# Patient Record
Sex: Female | Born: 1945 | Race: White | Hispanic: No | Marital: Married | Smoking: Former smoker
Health system: Southern US, Community
[De-identification: ages and names within clinical notes are randomized; demographics above are authoritative.]

## PROBLEM LIST (undated history)

## (undated) DIAGNOSIS — I471 Supraventricular tachycardia: Secondary | ICD-10-CM

## (undated) DIAGNOSIS — I10 Essential (primary) hypertension: Secondary | ICD-10-CM

## (undated) DIAGNOSIS — J449 Chronic obstructive pulmonary disease, unspecified: Secondary | ICD-10-CM

## (undated) DIAGNOSIS — E785 Hyperlipidemia, unspecified: Secondary | ICD-10-CM

## (undated) DIAGNOSIS — J45909 Unspecified asthma, uncomplicated: Secondary | ICD-10-CM

## (undated) DIAGNOSIS — Z923 Personal history of irradiation: Secondary | ICD-10-CM

## (undated) HISTORY — DX: Supraventricular tachycardia: I47.1

## (undated) HISTORY — PX: CHOLECYSTECTOMY: SHX55

## (undated) HISTORY — DX: Essential (primary) hypertension: I10

## (undated) HISTORY — PX: JOINT REPLACEMENT: SHX530

## (undated) HISTORY — PX: HEMORROIDECTOMY: SUR656

## (undated) HISTORY — PX: CARPAL TUNNEL RELEASE: SHX101

## (undated) HISTORY — DX: Hyperlipidemia, unspecified: E78.5

## (undated) HISTORY — PX: ABDOMINAL HYSTERECTOMY: SHX81

## (undated) HISTORY — PX: CATARACT EXTRACTION: SUR2

## (undated) HISTORY — PX: TOE SURGERY: SHX1073

## (undated) HISTORY — DX: Unspecified asthma, uncomplicated: J45.909

## (undated) HISTORY — PX: CARDIAC CATHETERIZATION: SHX172

---

## 2000-11-05 ENCOUNTER — Inpatient Hospital Stay (HOSPITAL_COMMUNITY): Admission: AD | Admit: 2000-11-05 | Discharge: 2000-11-06 | Payer: Self-pay | Admitting: Cardiology

## 2003-02-14 ENCOUNTER — Ambulatory Visit (HOSPITAL_BASED_OUTPATIENT_CLINIC_OR_DEPARTMENT_OTHER): Admission: RE | Admit: 2003-02-14 | Discharge: 2003-02-15 | Payer: Self-pay | Admitting: Specialist

## 2010-10-01 ENCOUNTER — Encounter: Payer: Self-pay | Admitting: Obstetrics and Gynecology

## 2011-03-14 ENCOUNTER — Ambulatory Visit: Payer: Self-pay | Admitting: Cardiovascular Disease

## 2012-04-27 ENCOUNTER — Ambulatory Visit (HOSPITAL_BASED_OUTPATIENT_CLINIC_OR_DEPARTMENT_OTHER): Admission: RE | Admit: 2012-04-27 | Payer: Managed Care, Other (non HMO) | Source: Ambulatory Visit

## 2012-04-27 ENCOUNTER — Encounter (HOSPITAL_BASED_OUTPATIENT_CLINIC_OR_DEPARTMENT_OTHER): Admission: RE | Payer: Self-pay | Source: Ambulatory Visit

## 2012-04-27 SURGERY — OSTEOTOMY, METATARSAL BONE
Anesthesia: Monitor Anesthesia Care | Laterality: Right

## 2012-06-08 ENCOUNTER — Ambulatory Visit (HOSPITAL_BASED_OUTPATIENT_CLINIC_OR_DEPARTMENT_OTHER): Admission: RE | Admit: 2012-06-08 | Payer: Managed Care, Other (non HMO) | Source: Ambulatory Visit

## 2012-06-08 ENCOUNTER — Encounter (HOSPITAL_BASED_OUTPATIENT_CLINIC_OR_DEPARTMENT_OTHER): Admission: RE | Payer: Self-pay | Source: Ambulatory Visit

## 2012-06-08 SURGERY — OSTEOTOMY, METATARSAL BONE
Anesthesia: Regional | Laterality: Right

## 2014-09-13 DIAGNOSIS — E785 Hyperlipidemia, unspecified: Secondary | ICD-10-CM | POA: Diagnosis not present

## 2014-09-13 DIAGNOSIS — J45909 Unspecified asthma, uncomplicated: Secondary | ICD-10-CM | POA: Diagnosis not present

## 2014-09-13 DIAGNOSIS — E780 Pure hypercholesterolemia: Secondary | ICD-10-CM | POA: Diagnosis not present

## 2014-09-13 DIAGNOSIS — E119 Type 2 diabetes mellitus without complications: Secondary | ICD-10-CM | POA: Diagnosis not present

## 2014-09-13 DIAGNOSIS — I471 Supraventricular tachycardia: Secondary | ICD-10-CM | POA: Diagnosis not present

## 2014-10-05 DIAGNOSIS — Z6823 Body mass index (BMI) 23.0-23.9, adult: Secondary | ICD-10-CM | POA: Diagnosis not present

## 2014-10-05 DIAGNOSIS — E119 Type 2 diabetes mellitus without complications: Secondary | ICD-10-CM | POA: Diagnosis not present

## 2014-10-05 DIAGNOSIS — I471 Supraventricular tachycardia: Secondary | ICD-10-CM | POA: Diagnosis not present

## 2014-10-05 DIAGNOSIS — M1990 Unspecified osteoarthritis, unspecified site: Secondary | ICD-10-CM | POA: Diagnosis not present

## 2014-10-05 DIAGNOSIS — F329 Major depressive disorder, single episode, unspecified: Secondary | ICD-10-CM | POA: Diagnosis not present

## 2014-10-05 DIAGNOSIS — I10 Essential (primary) hypertension: Secondary | ICD-10-CM | POA: Diagnosis not present

## 2014-10-05 DIAGNOSIS — E785 Hyperlipidemia, unspecified: Secondary | ICD-10-CM | POA: Diagnosis not present

## 2014-10-05 DIAGNOSIS — Z0000 Encounter for general adult medical examination without abnormal findings: Secondary | ICD-10-CM | POA: Diagnosis not present

## 2014-10-06 DIAGNOSIS — E119 Type 2 diabetes mellitus without complications: Secondary | ICD-10-CM | POA: Diagnosis not present

## 2014-10-06 DIAGNOSIS — I471 Supraventricular tachycardia: Secondary | ICD-10-CM | POA: Diagnosis not present

## 2014-10-06 DIAGNOSIS — R002 Palpitations: Secondary | ICD-10-CM | POA: Diagnosis not present

## 2014-10-06 DIAGNOSIS — I499 Cardiac arrhythmia, unspecified: Secondary | ICD-10-CM | POA: Diagnosis not present

## 2014-10-06 DIAGNOSIS — E780 Pure hypercholesterolemia: Secondary | ICD-10-CM | POA: Diagnosis not present

## 2014-10-14 DIAGNOSIS — J45909 Unspecified asthma, uncomplicated: Secondary | ICD-10-CM | POA: Diagnosis not present

## 2014-10-18 DIAGNOSIS — R002 Palpitations: Secondary | ICD-10-CM | POA: Diagnosis not present

## 2014-11-03 DIAGNOSIS — I471 Supraventricular tachycardia: Secondary | ICD-10-CM | POA: Diagnosis not present

## 2014-11-03 DIAGNOSIS — E785 Hyperlipidemia, unspecified: Secondary | ICD-10-CM | POA: Diagnosis not present

## 2014-11-10 DIAGNOSIS — I471 Supraventricular tachycardia: Secondary | ICD-10-CM | POA: Diagnosis not present

## 2014-11-10 DIAGNOSIS — H04129 Dry eye syndrome of unspecified lacrimal gland: Secondary | ICD-10-CM | POA: Diagnosis not present

## 2014-11-12 DIAGNOSIS — J45909 Unspecified asthma, uncomplicated: Secondary | ICD-10-CM | POA: Diagnosis not present

## 2014-11-14 DIAGNOSIS — I471 Supraventricular tachycardia: Secondary | ICD-10-CM | POA: Diagnosis not present

## 2014-12-04 DIAGNOSIS — E780 Pure hypercholesterolemia: Secondary | ICD-10-CM | POA: Diagnosis not present

## 2014-12-04 DIAGNOSIS — I10 Essential (primary) hypertension: Secondary | ICD-10-CM | POA: Diagnosis not present

## 2014-12-04 DIAGNOSIS — M7022 Olecranon bursitis, left elbow: Secondary | ICD-10-CM | POA: Diagnosis not present

## 2014-12-04 DIAGNOSIS — J45909 Unspecified asthma, uncomplicated: Secondary | ICD-10-CM | POA: Diagnosis not present

## 2014-12-04 DIAGNOSIS — M719 Bursopathy, unspecified: Secondary | ICD-10-CM | POA: Diagnosis not present

## 2014-12-04 DIAGNOSIS — I4891 Unspecified atrial fibrillation: Secondary | ICD-10-CM | POA: Diagnosis not present

## 2014-12-05 DIAGNOSIS — I480 Paroxysmal atrial fibrillation: Secondary | ICD-10-CM | POA: Diagnosis not present

## 2014-12-05 DIAGNOSIS — R609 Edema, unspecified: Secondary | ICD-10-CM | POA: Diagnosis not present

## 2014-12-05 DIAGNOSIS — M009 Pyogenic arthritis, unspecified: Secondary | ICD-10-CM | POA: Diagnosis not present

## 2014-12-05 DIAGNOSIS — K219 Gastro-esophageal reflux disease without esophagitis: Secondary | ICD-10-CM | POA: Diagnosis not present

## 2014-12-05 DIAGNOSIS — M7022 Olecranon bursitis, left elbow: Secondary | ICD-10-CM | POA: Diagnosis not present

## 2014-12-05 DIAGNOSIS — E780 Pure hypercholesterolemia: Secondary | ICD-10-CM | POA: Diagnosis not present

## 2014-12-05 DIAGNOSIS — J45909 Unspecified asthma, uncomplicated: Secondary | ICD-10-CM | POA: Diagnosis not present

## 2014-12-05 DIAGNOSIS — Z87891 Personal history of nicotine dependence: Secondary | ICD-10-CM | POA: Diagnosis not present

## 2014-12-05 DIAGNOSIS — I10 Essential (primary) hypertension: Secondary | ICD-10-CM | POA: Diagnosis not present

## 2014-12-13 DIAGNOSIS — J45909 Unspecified asthma, uncomplicated: Secondary | ICD-10-CM | POA: Diagnosis not present

## 2014-12-20 DIAGNOSIS — R300 Dysuria: Secondary | ICD-10-CM | POA: Diagnosis not present

## 2014-12-20 DIAGNOSIS — Z6823 Body mass index (BMI) 23.0-23.9, adult: Secondary | ICD-10-CM | POA: Diagnosis not present

## 2014-12-20 DIAGNOSIS — M7022 Olecranon bursitis, left elbow: Secondary | ICD-10-CM | POA: Diagnosis not present

## 2014-12-20 DIAGNOSIS — N3090 Cystitis, unspecified without hematuria: Secondary | ICD-10-CM | POA: Diagnosis not present

## 2014-12-20 DIAGNOSIS — B370 Candidal stomatitis: Secondary | ICD-10-CM | POA: Diagnosis not present

## 2014-12-21 DIAGNOSIS — H2512 Age-related nuclear cataract, left eye: Secondary | ICD-10-CM | POA: Diagnosis not present

## 2014-12-21 DIAGNOSIS — E119 Type 2 diabetes mellitus without complications: Secondary | ICD-10-CM | POA: Diagnosis not present

## 2014-12-26 DIAGNOSIS — H2511 Age-related nuclear cataract, right eye: Secondary | ICD-10-CM | POA: Diagnosis not present

## 2014-12-26 DIAGNOSIS — H52201 Unspecified astigmatism, right eye: Secondary | ICD-10-CM | POA: Diagnosis not present

## 2014-12-26 DIAGNOSIS — H25812 Combined forms of age-related cataract, left eye: Secondary | ICD-10-CM | POA: Diagnosis not present

## 2014-12-26 DIAGNOSIS — H2512 Age-related nuclear cataract, left eye: Secondary | ICD-10-CM | POA: Diagnosis not present

## 2015-01-12 DIAGNOSIS — J45909 Unspecified asthma, uncomplicated: Secondary | ICD-10-CM | POA: Diagnosis not present

## 2015-01-31 DIAGNOSIS — K649 Unspecified hemorrhoids: Secondary | ICD-10-CM | POA: Diagnosis not present

## 2015-01-31 DIAGNOSIS — Z6823 Body mass index (BMI) 23.0-23.9, adult: Secondary | ICD-10-CM | POA: Diagnosis not present

## 2015-01-31 DIAGNOSIS — J45901 Unspecified asthma with (acute) exacerbation: Secondary | ICD-10-CM | POA: Diagnosis not present

## 2015-02-12 DIAGNOSIS — J45909 Unspecified asthma, uncomplicated: Secondary | ICD-10-CM | POA: Diagnosis not present

## 2015-02-15 DIAGNOSIS — Z6823 Body mass index (BMI) 23.0-23.9, adult: Secondary | ICD-10-CM | POA: Diagnosis not present

## 2015-02-15 DIAGNOSIS — I471 Supraventricular tachycardia: Secondary | ICD-10-CM | POA: Diagnosis not present

## 2015-02-15 DIAGNOSIS — I10 Essential (primary) hypertension: Secondary | ICD-10-CM | POA: Diagnosis not present

## 2015-02-15 DIAGNOSIS — E785 Hyperlipidemia, unspecified: Secondary | ICD-10-CM | POA: Diagnosis not present

## 2015-02-15 DIAGNOSIS — E119 Type 2 diabetes mellitus without complications: Secondary | ICD-10-CM | POA: Diagnosis not present

## 2015-02-15 DIAGNOSIS — Z9181 History of falling: Secondary | ICD-10-CM | POA: Diagnosis not present

## 2015-03-14 DIAGNOSIS — J45909 Unspecified asthma, uncomplicated: Secondary | ICD-10-CM | POA: Diagnosis not present

## 2015-03-27 DIAGNOSIS — H2511 Age-related nuclear cataract, right eye: Secondary | ICD-10-CM | POA: Diagnosis not present

## 2015-03-27 DIAGNOSIS — H52201 Unspecified astigmatism, right eye: Secondary | ICD-10-CM | POA: Diagnosis not present

## 2015-03-27 DIAGNOSIS — E119 Type 2 diabetes mellitus without complications: Secondary | ICD-10-CM | POA: Diagnosis not present

## 2015-03-27 DIAGNOSIS — H25811 Combined forms of age-related cataract, right eye: Secondary | ICD-10-CM | POA: Diagnosis not present

## 2015-04-14 DIAGNOSIS — J45909 Unspecified asthma, uncomplicated: Secondary | ICD-10-CM | POA: Diagnosis not present

## 2015-05-08 DIAGNOSIS — Z6823 Body mass index (BMI) 23.0-23.9, adult: Secondary | ICD-10-CM | POA: Diagnosis not present

## 2015-05-08 DIAGNOSIS — K649 Unspecified hemorrhoids: Secondary | ICD-10-CM | POA: Diagnosis not present

## 2015-05-15 DIAGNOSIS — J45909 Unspecified asthma, uncomplicated: Secondary | ICD-10-CM | POA: Diagnosis not present

## 2015-05-25 DIAGNOSIS — L821 Other seborrheic keratosis: Secondary | ICD-10-CM | POA: Diagnosis not present

## 2015-05-25 DIAGNOSIS — C44311 Basal cell carcinoma of skin of nose: Secondary | ICD-10-CM | POA: Diagnosis not present

## 2015-05-25 DIAGNOSIS — D485 Neoplasm of uncertain behavior of skin: Secondary | ICD-10-CM | POA: Diagnosis not present

## 2015-05-25 DIAGNOSIS — L570 Actinic keratosis: Secondary | ICD-10-CM | POA: Diagnosis not present

## 2015-06-02 DIAGNOSIS — C44311 Basal cell carcinoma of skin of nose: Secondary | ICD-10-CM | POA: Diagnosis not present

## 2015-06-05 DIAGNOSIS — K649 Unspecified hemorrhoids: Secondary | ICD-10-CM | POA: Diagnosis not present

## 2015-06-05 DIAGNOSIS — Z6824 Body mass index (BMI) 24.0-24.9, adult: Secondary | ICD-10-CM | POA: Diagnosis not present

## 2015-06-08 DIAGNOSIS — K625 Hemorrhage of anus and rectum: Secondary | ICD-10-CM | POA: Diagnosis not present

## 2015-06-08 DIAGNOSIS — K6289 Other specified diseases of anus and rectum: Secondary | ICD-10-CM | POA: Diagnosis not present

## 2015-06-08 DIAGNOSIS — K644 Residual hemorrhoidal skin tags: Secondary | ICD-10-CM | POA: Diagnosis not present

## 2015-06-08 DIAGNOSIS — K622 Anal prolapse: Secondary | ICD-10-CM | POA: Diagnosis not present

## 2015-06-09 DIAGNOSIS — K645 Perianal venous thrombosis: Secondary | ICD-10-CM | POA: Diagnosis not present

## 2015-06-09 DIAGNOSIS — K623 Rectal prolapse: Secondary | ICD-10-CM | POA: Diagnosis not present

## 2015-06-09 DIAGNOSIS — Z79899 Other long term (current) drug therapy: Secondary | ICD-10-CM | POA: Diagnosis not present

## 2015-06-09 DIAGNOSIS — K644 Residual hemorrhoidal skin tags: Secondary | ICD-10-CM | POA: Diagnosis not present

## 2015-06-09 DIAGNOSIS — I10 Essential (primary) hypertension: Secondary | ICD-10-CM | POA: Diagnosis not present

## 2015-06-09 DIAGNOSIS — Z87891 Personal history of nicotine dependence: Secondary | ICD-10-CM | POA: Diagnosis not present

## 2015-06-09 DIAGNOSIS — Z9981 Dependence on supplemental oxygen: Secondary | ICD-10-CM | POA: Diagnosis not present

## 2015-06-09 DIAGNOSIS — E119 Type 2 diabetes mellitus without complications: Secondary | ICD-10-CM | POA: Diagnosis not present

## 2015-06-09 DIAGNOSIS — K648 Other hemorrhoids: Secondary | ICD-10-CM | POA: Diagnosis not present

## 2015-06-09 DIAGNOSIS — K622 Anal prolapse: Secondary | ICD-10-CM | POA: Diagnosis not present

## 2015-06-09 DIAGNOSIS — J45909 Unspecified asthma, uncomplicated: Secondary | ICD-10-CM | POA: Diagnosis not present

## 2015-06-10 DIAGNOSIS — K622 Anal prolapse: Secondary | ICD-10-CM | POA: Diagnosis not present

## 2015-06-10 DIAGNOSIS — Z9981 Dependence on supplemental oxygen: Secondary | ICD-10-CM | POA: Diagnosis not present

## 2015-06-10 DIAGNOSIS — Z87891 Personal history of nicotine dependence: Secondary | ICD-10-CM | POA: Diagnosis not present

## 2015-06-10 DIAGNOSIS — E119 Type 2 diabetes mellitus without complications: Secondary | ICD-10-CM | POA: Diagnosis not present

## 2015-06-10 DIAGNOSIS — J45909 Unspecified asthma, uncomplicated: Secondary | ICD-10-CM | POA: Diagnosis not present

## 2015-06-10 DIAGNOSIS — I10 Essential (primary) hypertension: Secondary | ICD-10-CM | POA: Diagnosis not present

## 2015-06-10 DIAGNOSIS — Z79899 Other long term (current) drug therapy: Secondary | ICD-10-CM | POA: Diagnosis not present

## 2015-06-10 DIAGNOSIS — K644 Residual hemorrhoidal skin tags: Secondary | ICD-10-CM | POA: Diagnosis not present

## 2015-06-10 DIAGNOSIS — K648 Other hemorrhoids: Secondary | ICD-10-CM | POA: Diagnosis not present

## 2015-06-14 DIAGNOSIS — J45909 Unspecified asthma, uncomplicated: Secondary | ICD-10-CM | POA: Diagnosis not present

## 2015-06-20 DIAGNOSIS — F419 Anxiety disorder, unspecified: Secondary | ICD-10-CM | POA: Diagnosis not present

## 2015-06-20 DIAGNOSIS — R0600 Dyspnea, unspecified: Secondary | ICD-10-CM | POA: Diagnosis not present

## 2015-06-20 DIAGNOSIS — J45909 Unspecified asthma, uncomplicated: Secondary | ICD-10-CM | POA: Diagnosis not present

## 2015-06-20 DIAGNOSIS — G8918 Other acute postprocedural pain: Secondary | ICD-10-CM | POA: Diagnosis not present

## 2015-06-20 DIAGNOSIS — E119 Type 2 diabetes mellitus without complications: Secondary | ICD-10-CM | POA: Diagnosis not present

## 2015-06-20 DIAGNOSIS — I480 Paroxysmal atrial fibrillation: Secondary | ICD-10-CM | POA: Diagnosis not present

## 2015-06-20 DIAGNOSIS — K6289 Other specified diseases of anus and rectum: Secondary | ICD-10-CM | POA: Diagnosis not present

## 2015-06-20 DIAGNOSIS — F329 Major depressive disorder, single episode, unspecified: Secondary | ICD-10-CM | POA: Diagnosis not present

## 2015-06-20 DIAGNOSIS — I10 Essential (primary) hypertension: Secondary | ICD-10-CM | POA: Diagnosis not present

## 2015-06-20 DIAGNOSIS — Z87891 Personal history of nicotine dependence: Secondary | ICD-10-CM | POA: Diagnosis not present

## 2015-07-12 DIAGNOSIS — F411 Generalized anxiety disorder: Secondary | ICD-10-CM | POA: Diagnosis not present

## 2015-07-12 DIAGNOSIS — Z139 Encounter for screening, unspecified: Secondary | ICD-10-CM | POA: Diagnosis not present

## 2015-07-12 DIAGNOSIS — I10 Essential (primary) hypertension: Secondary | ICD-10-CM | POA: Diagnosis not present

## 2015-07-12 DIAGNOSIS — J4540 Moderate persistent asthma, uncomplicated: Secondary | ICD-10-CM | POA: Diagnosis not present

## 2015-07-12 DIAGNOSIS — Z6823 Body mass index (BMI) 23.0-23.9, adult: Secondary | ICD-10-CM | POA: Diagnosis not present

## 2015-07-12 DIAGNOSIS — E785 Hyperlipidemia, unspecified: Secondary | ICD-10-CM | POA: Diagnosis not present

## 2015-07-12 DIAGNOSIS — E119 Type 2 diabetes mellitus without complications: Secondary | ICD-10-CM | POA: Diagnosis not present

## 2015-07-15 DIAGNOSIS — J45909 Unspecified asthma, uncomplicated: Secondary | ICD-10-CM | POA: Diagnosis not present

## 2015-07-28 DIAGNOSIS — Z6823 Body mass index (BMI) 23.0-23.9, adult: Secondary | ICD-10-CM | POA: Diagnosis not present

## 2015-07-28 DIAGNOSIS — J4540 Moderate persistent asthma, uncomplicated: Secondary | ICD-10-CM | POA: Diagnosis not present

## 2015-08-01 DIAGNOSIS — D485 Neoplasm of uncertain behavior of skin: Secondary | ICD-10-CM | POA: Diagnosis not present

## 2015-08-01 DIAGNOSIS — L570 Actinic keratosis: Secondary | ICD-10-CM | POA: Diagnosis not present

## 2015-08-01 DIAGNOSIS — C44311 Basal cell carcinoma of skin of nose: Secondary | ICD-10-CM | POA: Diagnosis not present

## 2015-08-01 DIAGNOSIS — L578 Other skin changes due to chronic exposure to nonionizing radiation: Secondary | ICD-10-CM | POA: Diagnosis not present

## 2015-08-14 DIAGNOSIS — J209 Acute bronchitis, unspecified: Secondary | ICD-10-CM | POA: Diagnosis not present

## 2015-08-14 DIAGNOSIS — J45909 Unspecified asthma, uncomplicated: Secondary | ICD-10-CM | POA: Diagnosis not present

## 2015-08-14 DIAGNOSIS — Z6824 Body mass index (BMI) 24.0-24.9, adult: Secondary | ICD-10-CM | POA: Diagnosis not present

## 2015-09-13 DIAGNOSIS — H04123 Dry eye syndrome of bilateral lacrimal glands: Secondary | ICD-10-CM | POA: Diagnosis not present

## 2015-09-14 DIAGNOSIS — K644 Residual hemorrhoidal skin tags: Secondary | ICD-10-CM | POA: Diagnosis not present

## 2015-09-14 DIAGNOSIS — K602 Anal fissure, unspecified: Secondary | ICD-10-CM | POA: Diagnosis not present

## 2015-09-14 DIAGNOSIS — J45909 Unspecified asthma, uncomplicated: Secondary | ICD-10-CM | POA: Diagnosis not present

## 2015-09-14 DIAGNOSIS — L980 Pyogenic granuloma: Secondary | ICD-10-CM | POA: Diagnosis not present

## 2015-09-25 DIAGNOSIS — L980 Pyogenic granuloma: Secondary | ICD-10-CM | POA: Diagnosis not present

## 2015-09-25 DIAGNOSIS — K921 Melena: Secondary | ICD-10-CM | POA: Diagnosis not present

## 2015-09-25 DIAGNOSIS — K644 Residual hemorrhoidal skin tags: Secondary | ICD-10-CM | POA: Diagnosis not present

## 2015-09-25 DIAGNOSIS — M1990 Unspecified osteoarthritis, unspecified site: Secondary | ICD-10-CM | POA: Diagnosis not present

## 2015-10-15 DIAGNOSIS — J45909 Unspecified asthma, uncomplicated: Secondary | ICD-10-CM | POA: Diagnosis not present

## 2015-10-16 DIAGNOSIS — Z6824 Body mass index (BMI) 24.0-24.9, adult: Secondary | ICD-10-CM | POA: Diagnosis not present

## 2015-10-16 DIAGNOSIS — J209 Acute bronchitis, unspecified: Secondary | ICD-10-CM | POA: Diagnosis not present

## 2015-10-23 DIAGNOSIS — L980 Pyogenic granuloma: Secondary | ICD-10-CM | POA: Diagnosis not present

## 2015-10-23 DIAGNOSIS — M1990 Unspecified osteoarthritis, unspecified site: Secondary | ICD-10-CM | POA: Diagnosis not present

## 2015-10-23 DIAGNOSIS — K644 Residual hemorrhoidal skin tags: Secondary | ICD-10-CM | POA: Diagnosis not present

## 2015-11-07 DIAGNOSIS — F411 Generalized anxiety disorder: Secondary | ICD-10-CM | POA: Diagnosis not present

## 2015-11-07 DIAGNOSIS — I471 Supraventricular tachycardia: Secondary | ICD-10-CM | POA: Diagnosis not present

## 2015-11-07 DIAGNOSIS — Z1389 Encounter for screening for other disorder: Secondary | ICD-10-CM | POA: Diagnosis not present

## 2015-11-07 DIAGNOSIS — E119 Type 2 diabetes mellitus without complications: Secondary | ICD-10-CM | POA: Diagnosis not present

## 2015-11-07 DIAGNOSIS — Z0000 Encounter for general adult medical examination without abnormal findings: Secondary | ICD-10-CM | POA: Diagnosis not present

## 2015-11-07 DIAGNOSIS — Z23 Encounter for immunization: Secondary | ICD-10-CM | POA: Diagnosis not present

## 2015-11-07 DIAGNOSIS — E785 Hyperlipidemia, unspecified: Secondary | ICD-10-CM | POA: Diagnosis not present

## 2015-11-07 DIAGNOSIS — I10 Essential (primary) hypertension: Secondary | ICD-10-CM | POA: Diagnosis not present

## 2015-11-12 DIAGNOSIS — J45909 Unspecified asthma, uncomplicated: Secondary | ICD-10-CM | POA: Diagnosis not present

## 2015-11-13 DIAGNOSIS — H26491 Other secondary cataract, right eye: Secondary | ICD-10-CM | POA: Diagnosis not present

## 2015-11-13 DIAGNOSIS — H04123 Dry eye syndrome of bilateral lacrimal glands: Secondary | ICD-10-CM | POA: Diagnosis not present

## 2015-11-15 DIAGNOSIS — M810 Age-related osteoporosis without current pathological fracture: Secondary | ICD-10-CM | POA: Diagnosis not present

## 2015-11-15 DIAGNOSIS — Z1231 Encounter for screening mammogram for malignant neoplasm of breast: Secondary | ICD-10-CM | POA: Diagnosis not present

## 2015-11-20 DIAGNOSIS — Z6824 Body mass index (BMI) 24.0-24.9, adult: Secondary | ICD-10-CM | POA: Diagnosis not present

## 2015-11-20 DIAGNOSIS — M810 Age-related osteoporosis without current pathological fracture: Secondary | ICD-10-CM | POA: Diagnosis not present

## 2015-12-13 DIAGNOSIS — J45909 Unspecified asthma, uncomplicated: Secondary | ICD-10-CM | POA: Diagnosis not present

## 2015-12-20 DIAGNOSIS — M810 Age-related osteoporosis without current pathological fracture: Secondary | ICD-10-CM | POA: Diagnosis not present

## 2016-01-09 DIAGNOSIS — Z6824 Body mass index (BMI) 24.0-24.9, adult: Secondary | ICD-10-CM | POA: Diagnosis not present

## 2016-01-09 DIAGNOSIS — R110 Nausea: Secondary | ICD-10-CM | POA: Diagnosis not present

## 2016-01-12 DIAGNOSIS — J45909 Unspecified asthma, uncomplicated: Secondary | ICD-10-CM | POA: Diagnosis not present

## 2016-01-15 DIAGNOSIS — K8010 Calculus of gallbladder with chronic cholecystitis without obstruction: Secondary | ICD-10-CM | POA: Diagnosis not present

## 2016-01-15 DIAGNOSIS — K810 Acute cholecystitis: Secondary | ICD-10-CM | POA: Diagnosis not present

## 2016-01-15 DIAGNOSIS — I480 Paroxysmal atrial fibrillation: Secondary | ICD-10-CM | POA: Diagnosis not present

## 2016-01-15 DIAGNOSIS — Z79899 Other long term (current) drug therapy: Secondary | ICD-10-CM | POA: Diagnosis not present

## 2016-01-15 DIAGNOSIS — K8020 Calculus of gallbladder without cholecystitis without obstruction: Secondary | ICD-10-CM | POA: Diagnosis not present

## 2016-01-15 DIAGNOSIS — K8000 Calculus of gallbladder with acute cholecystitis without obstruction: Secondary | ICD-10-CM | POA: Diagnosis not present

## 2016-01-15 DIAGNOSIS — I10 Essential (primary) hypertension: Secondary | ICD-10-CM | POA: Diagnosis not present

## 2016-01-15 DIAGNOSIS — R1011 Right upper quadrant pain: Secondary | ICD-10-CM | POA: Diagnosis not present

## 2016-01-15 DIAGNOSIS — Z01810 Encounter for preprocedural cardiovascular examination: Secondary | ICD-10-CM | POA: Diagnosis not present

## 2016-01-15 DIAGNOSIS — Z87891 Personal history of nicotine dependence: Secondary | ICD-10-CM | POA: Diagnosis not present

## 2016-01-15 DIAGNOSIS — E7800 Pure hypercholesterolemia, unspecified: Secondary | ICD-10-CM | POA: Diagnosis not present

## 2016-01-15 DIAGNOSIS — J45909 Unspecified asthma, uncomplicated: Secondary | ICD-10-CM | POA: Diagnosis not present

## 2016-01-15 DIAGNOSIS — Z7982 Long term (current) use of aspirin: Secondary | ICD-10-CM | POA: Diagnosis not present

## 2016-01-16 DIAGNOSIS — K8010 Calculus of gallbladder with chronic cholecystitis without obstruction: Secondary | ICD-10-CM | POA: Diagnosis not present

## 2016-02-12 DIAGNOSIS — J45909 Unspecified asthma, uncomplicated: Secondary | ICD-10-CM | POA: Diagnosis not present

## 2016-02-21 DIAGNOSIS — M1990 Unspecified osteoarthritis, unspecified site: Secondary | ICD-10-CM | POA: Diagnosis not present

## 2016-02-21 DIAGNOSIS — Z6824 Body mass index (BMI) 24.0-24.9, adult: Secondary | ICD-10-CM | POA: Diagnosis not present

## 2016-03-06 DIAGNOSIS — M1990 Unspecified osteoarthritis, unspecified site: Secondary | ICD-10-CM | POA: Diagnosis not present

## 2016-03-06 DIAGNOSIS — I10 Essential (primary) hypertension: Secondary | ICD-10-CM | POA: Diagnosis not present

## 2016-03-06 DIAGNOSIS — Z9181 History of falling: Secondary | ICD-10-CM | POA: Diagnosis not present

## 2016-03-06 DIAGNOSIS — I471 Supraventricular tachycardia: Secondary | ICD-10-CM | POA: Diagnosis not present

## 2016-03-06 DIAGNOSIS — E785 Hyperlipidemia, unspecified: Secondary | ICD-10-CM | POA: Diagnosis not present

## 2016-03-06 DIAGNOSIS — Z6824 Body mass index (BMI) 24.0-24.9, adult: Secondary | ICD-10-CM | POA: Diagnosis not present

## 2016-03-13 DIAGNOSIS — J45909 Unspecified asthma, uncomplicated: Secondary | ICD-10-CM | POA: Diagnosis not present

## 2016-04-10 DIAGNOSIS — J45901 Unspecified asthma with (acute) exacerbation: Secondary | ICD-10-CM | POA: Diagnosis not present

## 2016-04-10 DIAGNOSIS — J208 Acute bronchitis due to other specified organisms: Secondary | ICD-10-CM | POA: Diagnosis not present

## 2016-04-13 DIAGNOSIS — J45909 Unspecified asthma, uncomplicated: Secondary | ICD-10-CM | POA: Diagnosis not present

## 2016-05-14 DIAGNOSIS — J45909 Unspecified asthma, uncomplicated: Secondary | ICD-10-CM | POA: Diagnosis not present

## 2016-05-28 DIAGNOSIS — H26493 Other secondary cataract, bilateral: Secondary | ICD-10-CM | POA: Diagnosis not present

## 2016-06-13 DIAGNOSIS — J45909 Unspecified asthma, uncomplicated: Secondary | ICD-10-CM | POA: Diagnosis not present

## 2016-06-14 DIAGNOSIS — Z23 Encounter for immunization: Secondary | ICD-10-CM | POA: Diagnosis not present

## 2016-06-14 DIAGNOSIS — I471 Supraventricular tachycardia: Secondary | ICD-10-CM | POA: Diagnosis not present

## 2016-06-14 DIAGNOSIS — M810 Age-related osteoporosis without current pathological fracture: Secondary | ICD-10-CM | POA: Diagnosis not present

## 2016-06-21 DIAGNOSIS — M810 Age-related osteoporosis without current pathological fracture: Secondary | ICD-10-CM | POA: Diagnosis not present

## 2016-07-05 DIAGNOSIS — R002 Palpitations: Secondary | ICD-10-CM | POA: Insufficient documentation

## 2016-07-05 DIAGNOSIS — I10 Essential (primary) hypertension: Secondary | ICD-10-CM | POA: Insufficient documentation

## 2016-07-05 DIAGNOSIS — I471 Supraventricular tachycardia, unspecified: Secondary | ICD-10-CM | POA: Insufficient documentation

## 2016-07-05 HISTORY — DX: Supraventricular tachycardia: I47.1

## 2016-07-05 HISTORY — DX: Palpitations: R00.2

## 2016-07-05 HISTORY — DX: Essential (primary) hypertension: I10

## 2016-07-05 HISTORY — DX: Supraventricular tachycardia, unspecified: I47.10

## 2016-07-10 DIAGNOSIS — M25561 Pain in right knee: Secondary | ICD-10-CM | POA: Diagnosis not present

## 2016-07-10 DIAGNOSIS — I471 Supraventricular tachycardia: Secondary | ICD-10-CM | POA: Diagnosis not present

## 2016-07-14 DIAGNOSIS — J45909 Unspecified asthma, uncomplicated: Secondary | ICD-10-CM | POA: Diagnosis not present

## 2016-07-17 DIAGNOSIS — I10 Essential (primary) hypertension: Secondary | ICD-10-CM | POA: Diagnosis not present

## 2016-07-17 DIAGNOSIS — R002 Palpitations: Secondary | ICD-10-CM | POA: Diagnosis not present

## 2016-07-17 DIAGNOSIS — I471 Supraventricular tachycardia: Secondary | ICD-10-CM | POA: Diagnosis not present

## 2016-07-19 DIAGNOSIS — M1711 Unilateral primary osteoarthritis, right knee: Secondary | ICD-10-CM | POA: Diagnosis not present

## 2016-07-22 DIAGNOSIS — R52 Pain, unspecified: Secondary | ICD-10-CM | POA: Diagnosis not present

## 2016-07-22 DIAGNOSIS — Z01818 Encounter for other preprocedural examination: Secondary | ICD-10-CM | POA: Diagnosis not present

## 2016-07-22 DIAGNOSIS — Z79899 Other long term (current) drug therapy: Secondary | ICD-10-CM | POA: Diagnosis not present

## 2016-07-22 DIAGNOSIS — J9809 Other diseases of bronchus, not elsewhere classified: Secondary | ICD-10-CM | POA: Diagnosis not present

## 2016-07-22 DIAGNOSIS — Z01810 Encounter for preprocedural cardiovascular examination: Secondary | ICD-10-CM | POA: Diagnosis not present

## 2016-07-22 DIAGNOSIS — M79609 Pain in unspecified limb: Secondary | ICD-10-CM | POA: Diagnosis not present

## 2016-08-02 DIAGNOSIS — Z6825 Body mass index (BMI) 25.0-25.9, adult: Secondary | ICD-10-CM | POA: Diagnosis not present

## 2016-08-02 DIAGNOSIS — N390 Urinary tract infection, site not specified: Secondary | ICD-10-CM | POA: Diagnosis not present

## 2016-08-08 DIAGNOSIS — J4540 Moderate persistent asthma, uncomplicated: Secondary | ICD-10-CM | POA: Diagnosis not present

## 2016-08-08 DIAGNOSIS — I471 Supraventricular tachycardia: Secondary | ICD-10-CM | POA: Diagnosis not present

## 2016-08-08 DIAGNOSIS — N390 Urinary tract infection, site not specified: Secondary | ICD-10-CM | POA: Diagnosis not present

## 2016-08-08 DIAGNOSIS — J208 Acute bronchitis due to other specified organisms: Secondary | ICD-10-CM | POA: Diagnosis not present

## 2016-08-13 DIAGNOSIS — J45909 Unspecified asthma, uncomplicated: Secondary | ICD-10-CM | POA: Diagnosis not present

## 2016-08-13 DIAGNOSIS — I471 Supraventricular tachycardia: Secondary | ICD-10-CM | POA: Diagnosis not present

## 2016-08-13 DIAGNOSIS — R0789 Other chest pain: Secondary | ICD-10-CM

## 2016-08-13 DIAGNOSIS — E785 Hyperlipidemia, unspecified: Secondary | ICD-10-CM

## 2016-08-13 DIAGNOSIS — R002 Palpitations: Secondary | ICD-10-CM | POA: Diagnosis not present

## 2016-08-13 DIAGNOSIS — E784 Other hyperlipidemia: Secondary | ICD-10-CM | POA: Diagnosis not present

## 2016-08-13 DIAGNOSIS — I10 Essential (primary) hypertension: Secondary | ICD-10-CM | POA: Diagnosis not present

## 2016-08-13 HISTORY — DX: Hyperlipidemia, unspecified: E78.5

## 2016-08-13 HISTORY — DX: Other chest pain: R07.89

## 2016-08-15 DIAGNOSIS — I471 Supraventricular tachycardia: Secondary | ICD-10-CM | POA: Diagnosis not present

## 2016-08-15 DIAGNOSIS — R0789 Other chest pain: Secondary | ICD-10-CM | POA: Diagnosis not present

## 2016-08-15 DIAGNOSIS — I10 Essential (primary) hypertension: Secondary | ICD-10-CM | POA: Diagnosis not present

## 2016-08-15 DIAGNOSIS — R002 Palpitations: Secondary | ICD-10-CM | POA: Diagnosis not present

## 2016-08-15 DIAGNOSIS — E784 Other hyperlipidemia: Secondary | ICD-10-CM | POA: Diagnosis not present

## 2016-08-19 DIAGNOSIS — J208 Acute bronchitis due to other specified organisms: Secondary | ICD-10-CM | POA: Diagnosis not present

## 2016-08-19 DIAGNOSIS — J40 Bronchitis, not specified as acute or chronic: Secondary | ICD-10-CM | POA: Diagnosis not present

## 2016-08-27 DIAGNOSIS — H26491 Other secondary cataract, right eye: Secondary | ICD-10-CM | POA: Diagnosis not present

## 2016-09-13 DIAGNOSIS — J45909 Unspecified asthma, uncomplicated: Secondary | ICD-10-CM | POA: Diagnosis not present

## 2016-09-17 DIAGNOSIS — M1711 Unilateral primary osteoarthritis, right knee: Secondary | ICD-10-CM | POA: Diagnosis not present

## 2016-09-23 DIAGNOSIS — J208 Acute bronchitis due to other specified organisms: Secondary | ICD-10-CM | POA: Diagnosis not present

## 2016-09-23 DIAGNOSIS — Z6825 Body mass index (BMI) 25.0-25.9, adult: Secondary | ICD-10-CM | POA: Diagnosis not present

## 2016-10-04 DIAGNOSIS — J180 Bronchopneumonia, unspecified organism: Secondary | ICD-10-CM | POA: Diagnosis not present

## 2016-10-04 DIAGNOSIS — J45901 Unspecified asthma with (acute) exacerbation: Secondary | ICD-10-CM | POA: Diagnosis not present

## 2016-10-14 DIAGNOSIS — J45909 Unspecified asthma, uncomplicated: Secondary | ICD-10-CM | POA: Diagnosis not present

## 2016-10-15 DIAGNOSIS — J180 Bronchopneumonia, unspecified organism: Secondary | ICD-10-CM | POA: Diagnosis not present

## 2016-10-15 DIAGNOSIS — J4540 Moderate persistent asthma, uncomplicated: Secondary | ICD-10-CM | POA: Diagnosis not present

## 2016-10-17 DIAGNOSIS — M79609 Pain in unspecified limb: Secondary | ICD-10-CM | POA: Diagnosis not present

## 2016-10-17 DIAGNOSIS — Z01812 Encounter for preprocedural laboratory examination: Secondary | ICD-10-CM | POA: Diagnosis not present

## 2016-10-17 DIAGNOSIS — Z8709 Personal history of other diseases of the respiratory system: Secondary | ICD-10-CM | POA: Diagnosis not present

## 2016-10-17 DIAGNOSIS — Z01818 Encounter for other preprocedural examination: Secondary | ICD-10-CM | POA: Diagnosis not present

## 2016-10-17 DIAGNOSIS — Z79899 Other long term (current) drug therapy: Secondary | ICD-10-CM | POA: Diagnosis not present

## 2016-10-22 DIAGNOSIS — M1711 Unilateral primary osteoarthritis, right knee: Secondary | ICD-10-CM | POA: Diagnosis not present

## 2016-10-22 DIAGNOSIS — Z01818 Encounter for other preprocedural examination: Secondary | ICD-10-CM | POA: Diagnosis not present

## 2016-10-30 DIAGNOSIS — I480 Paroxysmal atrial fibrillation: Secondary | ICD-10-CM | POA: Diagnosis not present

## 2016-10-30 DIAGNOSIS — J9610 Chronic respiratory failure, unspecified whether with hypoxia or hypercapnia: Secondary | ICD-10-CM | POA: Diagnosis not present

## 2016-10-30 DIAGNOSIS — F419 Anxiety disorder, unspecified: Secondary | ICD-10-CM | POA: Diagnosis not present

## 2016-10-30 DIAGNOSIS — M1711 Unilateral primary osteoarthritis, right knee: Secondary | ICD-10-CM | POA: Diagnosis not present

## 2016-10-30 DIAGNOSIS — R0602 Shortness of breath: Secondary | ICD-10-CM | POA: Diagnosis not present

## 2016-10-30 DIAGNOSIS — J449 Chronic obstructive pulmonary disease, unspecified: Secondary | ICD-10-CM | POA: Diagnosis not present

## 2016-10-30 DIAGNOSIS — G8918 Other acute postprocedural pain: Secondary | ICD-10-CM | POA: Diagnosis not present

## 2016-10-30 DIAGNOSIS — J45909 Unspecified asthma, uncomplicated: Secondary | ICD-10-CM | POA: Diagnosis not present

## 2016-10-30 DIAGNOSIS — J4540 Moderate persistent asthma, uncomplicated: Secondary | ICD-10-CM | POA: Diagnosis not present

## 2016-10-30 DIAGNOSIS — Z9981 Dependence on supplemental oxygen: Secondary | ICD-10-CM | POA: Diagnosis not present

## 2016-10-30 DIAGNOSIS — Z471 Aftercare following joint replacement surgery: Secondary | ICD-10-CM | POA: Diagnosis not present

## 2016-10-30 DIAGNOSIS — K219 Gastro-esophageal reflux disease without esophagitis: Secondary | ICD-10-CM | POA: Diagnosis not present

## 2016-10-30 DIAGNOSIS — I10 Essential (primary) hypertension: Secondary | ICD-10-CM | POA: Diagnosis not present

## 2016-10-30 DIAGNOSIS — E119 Type 2 diabetes mellitus without complications: Secondary | ICD-10-CM | POA: Diagnosis not present

## 2016-10-30 DIAGNOSIS — I482 Chronic atrial fibrillation: Secondary | ICD-10-CM | POA: Diagnosis not present

## 2016-10-30 DIAGNOSIS — E785 Hyperlipidemia, unspecified: Secondary | ICD-10-CM | POA: Diagnosis not present

## 2016-10-30 DIAGNOSIS — Z96651 Presence of right artificial knee joint: Secondary | ICD-10-CM | POA: Diagnosis not present

## 2016-10-31 DIAGNOSIS — E119 Type 2 diabetes mellitus without complications: Secondary | ICD-10-CM | POA: Diagnosis not present

## 2016-10-31 DIAGNOSIS — J9610 Chronic respiratory failure, unspecified whether with hypoxia or hypercapnia: Secondary | ICD-10-CM | POA: Diagnosis not present

## 2016-10-31 DIAGNOSIS — F419 Anxiety disorder, unspecified: Secondary | ICD-10-CM | POA: Diagnosis not present

## 2016-10-31 DIAGNOSIS — M1711 Unilateral primary osteoarthritis, right knee: Secondary | ICD-10-CM | POA: Diagnosis not present

## 2016-10-31 DIAGNOSIS — I10 Essential (primary) hypertension: Secondary | ICD-10-CM | POA: Diagnosis not present

## 2016-10-31 DIAGNOSIS — J449 Chronic obstructive pulmonary disease, unspecified: Secondary | ICD-10-CM | POA: Diagnosis not present

## 2016-10-31 DIAGNOSIS — K219 Gastro-esophageal reflux disease without esophagitis: Secondary | ICD-10-CM | POA: Diagnosis not present

## 2016-10-31 DIAGNOSIS — J4540 Moderate persistent asthma, uncomplicated: Secondary | ICD-10-CM | POA: Diagnosis not present

## 2016-10-31 DIAGNOSIS — I482 Chronic atrial fibrillation: Secondary | ICD-10-CM | POA: Diagnosis not present

## 2016-10-31 DIAGNOSIS — Z96651 Presence of right artificial knee joint: Secondary | ICD-10-CM | POA: Diagnosis not present

## 2016-10-31 DIAGNOSIS — I480 Paroxysmal atrial fibrillation: Secondary | ICD-10-CM | POA: Diagnosis not present

## 2016-10-31 DIAGNOSIS — E785 Hyperlipidemia, unspecified: Secondary | ICD-10-CM | POA: Diagnosis not present

## 2016-10-31 DIAGNOSIS — Z9981 Dependence on supplemental oxygen: Secondary | ICD-10-CM | POA: Diagnosis not present

## 2016-11-01 DIAGNOSIS — I480 Paroxysmal atrial fibrillation: Secondary | ICD-10-CM | POA: Diagnosis not present

## 2016-11-01 DIAGNOSIS — E119 Type 2 diabetes mellitus without complications: Secondary | ICD-10-CM | POA: Diagnosis not present

## 2016-11-01 DIAGNOSIS — E785 Hyperlipidemia, unspecified: Secondary | ICD-10-CM | POA: Diagnosis not present

## 2016-11-01 DIAGNOSIS — Z96651 Presence of right artificial knee joint: Secondary | ICD-10-CM | POA: Diagnosis not present

## 2016-11-01 DIAGNOSIS — J449 Chronic obstructive pulmonary disease, unspecified: Secondary | ICD-10-CM | POA: Diagnosis not present

## 2016-11-01 DIAGNOSIS — J9610 Chronic respiratory failure, unspecified whether with hypoxia or hypercapnia: Secondary | ICD-10-CM | POA: Diagnosis not present

## 2016-11-01 DIAGNOSIS — M1711 Unilateral primary osteoarthritis, right knee: Secondary | ICD-10-CM | POA: Diagnosis not present

## 2016-11-01 DIAGNOSIS — K219 Gastro-esophageal reflux disease without esophagitis: Secondary | ICD-10-CM | POA: Diagnosis not present

## 2016-11-01 DIAGNOSIS — I10 Essential (primary) hypertension: Secondary | ICD-10-CM | POA: Diagnosis not present

## 2016-11-01 DIAGNOSIS — I482 Chronic atrial fibrillation: Secondary | ICD-10-CM | POA: Diagnosis not present

## 2016-11-01 DIAGNOSIS — Z9981 Dependence on supplemental oxygen: Secondary | ICD-10-CM | POA: Diagnosis not present

## 2016-11-01 DIAGNOSIS — F419 Anxiety disorder, unspecified: Secondary | ICD-10-CM | POA: Diagnosis not present

## 2016-11-01 DIAGNOSIS — J4540 Moderate persistent asthma, uncomplicated: Secondary | ICD-10-CM | POA: Diagnosis not present

## 2016-11-02 DIAGNOSIS — S0091XA Abrasion of unspecified part of head, initial encounter: Secondary | ICD-10-CM | POA: Diagnosis not present

## 2016-11-02 DIAGNOSIS — S0990XA Unspecified injury of head, initial encounter: Secondary | ICD-10-CM | POA: Diagnosis not present

## 2016-11-02 DIAGNOSIS — S93331A Other subluxation of right foot, initial encounter: Secondary | ICD-10-CM | POA: Diagnosis not present

## 2016-11-02 DIAGNOSIS — Z7409 Other reduced mobility: Secondary | ICD-10-CM | POA: Diagnosis not present

## 2016-11-02 DIAGNOSIS — E785 Hyperlipidemia, unspecified: Secondary | ICD-10-CM | POA: Diagnosis not present

## 2016-11-02 DIAGNOSIS — Z886 Allergy status to analgesic agent status: Secondary | ICD-10-CM | POA: Diagnosis not present

## 2016-11-02 DIAGNOSIS — M25561 Pain in right knee: Secondary | ICD-10-CM | POA: Diagnosis not present

## 2016-11-02 DIAGNOSIS — S2090XA Unspecified superficial injury of unspecified parts of thorax, initial encounter: Secondary | ICD-10-CM | POA: Diagnosis not present

## 2016-11-02 DIAGNOSIS — I479 Paroxysmal tachycardia, unspecified: Secondary | ICD-10-CM | POA: Diagnosis not present

## 2016-11-02 DIAGNOSIS — R0789 Other chest pain: Secondary | ICD-10-CM | POA: Diagnosis not present

## 2016-11-02 DIAGNOSIS — K219 Gastro-esophageal reflux disease without esophagitis: Secondary | ICD-10-CM | POA: Diagnosis not present

## 2016-11-02 DIAGNOSIS — I10 Essential (primary) hypertension: Secondary | ICD-10-CM | POA: Diagnosis not present

## 2016-11-02 DIAGNOSIS — M79671 Pain in right foot: Secondary | ICD-10-CM | POA: Diagnosis not present

## 2016-11-02 DIAGNOSIS — I6200 Nontraumatic subdural hemorrhage, unspecified: Secondary | ICD-10-CM | POA: Diagnosis not present

## 2016-11-02 DIAGNOSIS — S065X9A Traumatic subdural hemorrhage with loss of consciousness of unspecified duration, initial encounter: Secondary | ICD-10-CM | POA: Diagnosis not present

## 2016-11-02 DIAGNOSIS — R0781 Pleurodynia: Secondary | ICD-10-CM | POA: Diagnosis not present

## 2016-11-02 DIAGNOSIS — W19XXXA Unspecified fall, initial encounter: Secondary | ICD-10-CM | POA: Diagnosis not present

## 2016-11-02 DIAGNOSIS — J45909 Unspecified asthma, uncomplicated: Secondary | ICD-10-CM | POA: Diagnosis not present

## 2016-11-02 DIAGNOSIS — R404 Transient alteration of awareness: Secondary | ICD-10-CM | POA: Diagnosis not present

## 2016-11-02 DIAGNOSIS — R296 Repeated falls: Secondary | ICD-10-CM | POA: Diagnosis not present

## 2016-11-02 DIAGNOSIS — S299XXA Unspecified injury of thorax, initial encounter: Secondary | ICD-10-CM | POA: Diagnosis not present

## 2016-11-02 DIAGNOSIS — Z96651 Presence of right artificial knee joint: Secondary | ICD-10-CM | POA: Diagnosis not present

## 2016-11-02 DIAGNOSIS — M25571 Pain in right ankle and joints of right foot: Secondary | ICD-10-CM | POA: Diagnosis not present

## 2016-11-02 DIAGNOSIS — R079 Chest pain, unspecified: Secondary | ICD-10-CM | POA: Diagnosis not present

## 2016-11-02 DIAGNOSIS — S065X0A Traumatic subdural hemorrhage without loss of consciousness, initial encounter: Secondary | ICD-10-CM | POA: Diagnosis not present

## 2016-11-05 DIAGNOSIS — Z96651 Presence of right artificial knee joint: Secondary | ICD-10-CM | POA: Diagnosis not present

## 2016-11-05 DIAGNOSIS — I6200 Nontraumatic subdural hemorrhage, unspecified: Secondary | ICD-10-CM | POA: Diagnosis not present

## 2016-11-05 DIAGNOSIS — J45909 Unspecified asthma, uncomplicated: Secondary | ICD-10-CM | POA: Diagnosis not present

## 2016-11-05 DIAGNOSIS — I10 Essential (primary) hypertension: Secondary | ICD-10-CM | POA: Diagnosis not present

## 2016-11-05 DIAGNOSIS — R296 Repeated falls: Secondary | ICD-10-CM | POA: Diagnosis not present

## 2016-11-05 DIAGNOSIS — I479 Paroxysmal tachycardia, unspecified: Secondary | ICD-10-CM | POA: Diagnosis not present

## 2016-11-05 DIAGNOSIS — R404 Transient alteration of awareness: Secondary | ICD-10-CM | POA: Diagnosis not present

## 2016-11-05 DIAGNOSIS — R079 Chest pain, unspecified: Secondary | ICD-10-CM | POA: Diagnosis not present

## 2016-11-05 DIAGNOSIS — E785 Hyperlipidemia, unspecified: Secondary | ICD-10-CM | POA: Diagnosis not present

## 2016-11-05 DIAGNOSIS — R262 Difficulty in walking, not elsewhere classified: Secondary | ICD-10-CM | POA: Diagnosis not present

## 2016-11-05 DIAGNOSIS — Z7409 Other reduced mobility: Secondary | ICD-10-CM | POA: Diagnosis not present

## 2016-11-05 DIAGNOSIS — I119 Hypertensive heart disease without heart failure: Secondary | ICD-10-CM | POA: Diagnosis not present

## 2016-11-05 DIAGNOSIS — G8918 Other acute postprocedural pain: Secondary | ICD-10-CM | POA: Diagnosis not present

## 2016-11-05 DIAGNOSIS — Z471 Aftercare following joint replacement surgery: Secondary | ICD-10-CM | POA: Diagnosis not present

## 2016-11-06 DIAGNOSIS — I119 Hypertensive heart disease without heart failure: Secondary | ICD-10-CM | POA: Diagnosis not present

## 2016-11-06 DIAGNOSIS — Z471 Aftercare following joint replacement surgery: Secondary | ICD-10-CM | POA: Diagnosis not present

## 2016-11-06 DIAGNOSIS — R262 Difficulty in walking, not elsewhere classified: Secondary | ICD-10-CM | POA: Diagnosis not present

## 2016-11-06 DIAGNOSIS — G8918 Other acute postprocedural pain: Secondary | ICD-10-CM | POA: Diagnosis not present

## 2016-11-20 ENCOUNTER — Other Ambulatory Visit: Payer: Self-pay

## 2016-11-20 DIAGNOSIS — R296 Repeated falls: Secondary | ICD-10-CM

## 2016-11-20 DIAGNOSIS — W19XXXA Unspecified fall, initial encounter: Secondary | ICD-10-CM | POA: Insufficient documentation

## 2016-11-20 DIAGNOSIS — S065XAA Traumatic subdural hemorrhage with loss of consciousness status unknown, initial encounter: Secondary | ICD-10-CM

## 2016-11-20 DIAGNOSIS — I10 Essential (primary) hypertension: Secondary | ICD-10-CM

## 2016-11-20 DIAGNOSIS — S065X9A Traumatic subdural hemorrhage with loss of consciousness of unspecified duration, initial encounter: Secondary | ICD-10-CM

## 2016-11-20 HISTORY — DX: Essential (primary) hypertension: I10

## 2016-11-20 HISTORY — DX: Repeated falls: R29.6

## 2016-11-20 HISTORY — DX: Traumatic subdural hemorrhage with loss of consciousness status unknown, initial encounter: S06.5XAA

## 2016-11-20 HISTORY — DX: Unspecified fall, initial encounter: W19.XXXA

## 2016-11-20 NOTE — Patient Outreach (Addendum)
[facility]) Care Management  11/20/2016  SOLACE MANWARREN 1946-03-18 [id number]  Screening and Transition of Care Assessment   Referral Date:  11/18/2016 Source:  [employer] Issue:  SNF / Rehab ([facility]) - discharge 11/15/16 Traumatic Subdural hemorrage  PCP:  Dr. Nelda Bucks Amy Derald Macleod NP (Feb. 24, 2018 - Present) [phone] (Work) [phone] (Fax) [address] [place], [place] [address] Neurologist:   Cardiologist: Dr. Jenne Campus, [place] appt 08/13/16 Podiatry:  Dr. 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 H/o falls while on Oxycodone  Outreach call #1 to patient.  Patient reached and completed call.  Patient states she is doing fine and her husband is there all the time.  States she has completed two appt's since discharge and set up for therapy.  States using a walker.  States she has no questions or issues with medications.  States Advance Directives are complete but she is not sure her MD has a copy.    Encounter Medications:  Outpatient Encounter Prescriptions as of 11/20/2016  Medication Sig  . Biotin (SUPER BIOTIN) 5 MG TABS Take by mouth.  . budesonide-formoterol (SYMBICORT) 160-4.5 MCG/ACT inhaler Inhale into the lungs 2 (two) times daily.  . clonazePAM (KLONOPIN) 1 MG tablet Take 1 mg by mouth.  . diltiazem (CARDIZEM CD) 360 MG 24 hr capsule Take 360 mg by mouth.  . docusate sodium (COLACE) 100 MG capsule Take 100 mg by mouth.  Marland Kitchen FLUoxetine (PROZAC) 20 MG tablet Take 20 mg by mouth.  . levalbuterol (XOPENEX) 1.25 MG/3ML nebulizer solution Take 1 ampule by nebulization every 4 (four) hours as needed for  Wheezing.  Marland Kitchen lisinopril (PRINIVIL,ZESTRIL) 10 MG tablet Take 10 mg by mouth.  . montelukast (SINGULAIR) 10 MG tablet Take 10 mg by mouth.  . Multiple Vitamin (MULTI-VITAMINS) TABS Take by mouth.  . nitrofurantoin (MACRODANTIN) 100 MG capsule Take 100 mg by mouth.  . pantoprazole (PROTONIX) 40 MG tablet Take 40 mg by mouth.  . simvastatin (ZOCOR) 20 MG tablet Take 20 mg by mouth.  . cetirizine (ZYRTEC) 10 MG chewable tablet Chew 10 mg by mouth.  Marland Kitchen omeprazole (PRILOSEC) 20 MG capsule Take 20 mg by mouth.   No facility-administered encounter medications on file as of 11/20/2016.     Functional Status:  In your present state of health, do you have any difficulty performing the following activities: 11/20/2016  Hearing? N  Vision? N  Difficulty concentrating or making decisions? N  Walking or climbing stairs? Y  Dressing or bathing? Y  Doing errands, shopping? Y  Preparing Food and eating ? N  Using the Toilet? N  In the past six months, have you accidently leaked urine? N  Do you have problems with loss of bowel control? N  Managing your Medications? N  Managing your Finances? N  Housekeeping or managing your Housekeeping? N  Some recent data might be hidden    Fall/Depression Screening: PHQ 2/9 Scores 11/20/2016  PHQ - 2 Score 0   Fall Risk  11/20/2016  Falls in the past year? Yes  Number falls in past yr: 2 or more  Injury with Fall? Yes  Risk Factor Category  High  Fall Risk  Risk for fall due to : History of fall(s);Impaired balance/gait;Impaired mobility;Medication side effect  Follow up Falls evaluation completed;Education provided;Falls prevention discussed    Assessment / Plan:  Screening and Transition of Care Assessment:  11/20/16 RN CM reviewed fall prevention; encouraged use of walker.  RN CM advised to notify MD of any change in condition.   Case closed:  Assessed / no needs identified by patient. Denies need for services.   PCP notified by case closure letter.   [place] notified.   Nathaneil Canary, BSN, RN, [place] Management Care Management Coordinator [phone] Direct [phone] Cell [phone] Office [phone] Fax [email or web address]

## 2016-11-28 DIAGNOSIS — S065X0A Traumatic subdural hemorrhage without loss of consciousness, initial encounter: Secondary | ICD-10-CM | POA: Diagnosis not present

## 2016-11-28 DIAGNOSIS — J4540 Moderate persistent asthma, uncomplicated: Secondary | ICD-10-CM | POA: Diagnosis not present

## 2016-11-28 DIAGNOSIS — I10 Essential (primary) hypertension: Secondary | ICD-10-CM | POA: Diagnosis not present

## 2016-11-28 DIAGNOSIS — I471 Supraventricular tachycardia: Secondary | ICD-10-CM | POA: Diagnosis not present

## 2016-12-10 DIAGNOSIS — M1711 Unilateral primary osteoarthritis, right knee: Secondary | ICD-10-CM | POA: Diagnosis not present

## 2016-12-10 DIAGNOSIS — Z96651 Presence of right artificial knee joint: Secondary | ICD-10-CM | POA: Diagnosis not present

## 2016-12-11 DIAGNOSIS — R002 Palpitations: Secondary | ICD-10-CM | POA: Diagnosis not present

## 2016-12-12 DIAGNOSIS — J45909 Unspecified asthma, uncomplicated: Secondary | ICD-10-CM | POA: Diagnosis not present

## 2016-12-13 DIAGNOSIS — R002 Palpitations: Secondary | ICD-10-CM | POA: Diagnosis not present

## 2017-01-03 DIAGNOSIS — M1711 Unilateral primary osteoarthritis, right knee: Secondary | ICD-10-CM | POA: Diagnosis not present

## 2017-01-03 DIAGNOSIS — Z96651 Presence of right artificial knee joint: Secondary | ICD-10-CM | POA: Diagnosis not present

## 2017-01-10 DIAGNOSIS — I493 Ventricular premature depolarization: Secondary | ICD-10-CM | POA: Diagnosis not present

## 2017-01-10 DIAGNOSIS — S065X0D Traumatic subdural hemorrhage without loss of consciousness, subsequent encounter: Secondary | ICD-10-CM | POA: Diagnosis not present

## 2017-01-10 DIAGNOSIS — R002 Palpitations: Secondary | ICD-10-CM | POA: Diagnosis not present

## 2017-01-10 DIAGNOSIS — R2689 Other abnormalities of gait and mobility: Secondary | ICD-10-CM | POA: Diagnosis not present

## 2017-01-11 DIAGNOSIS — J45909 Unspecified asthma, uncomplicated: Secondary | ICD-10-CM | POA: Diagnosis not present

## 2017-01-11 DIAGNOSIS — I493 Ventricular premature depolarization: Secondary | ICD-10-CM | POA: Diagnosis not present

## 2017-01-13 DIAGNOSIS — T8459XA Infection and inflammatory reaction due to other internal joint prosthesis, initial encounter: Secondary | ICD-10-CM | POA: Diagnosis not present

## 2017-01-13 DIAGNOSIS — Z888 Allergy status to other drugs, medicaments and biological substances status: Secondary | ICD-10-CM | POA: Diagnosis not present

## 2017-01-13 DIAGNOSIS — D72829 Elevated white blood cell count, unspecified: Secondary | ICD-10-CM | POA: Diagnosis not present

## 2017-01-13 DIAGNOSIS — T8453XA Infection and inflammatory reaction due to internal right knee prosthesis, initial encounter: Secondary | ICD-10-CM | POA: Diagnosis not present

## 2017-01-13 DIAGNOSIS — Z471 Aftercare following joint replacement surgery: Secondary | ICD-10-CM | POA: Diagnosis not present

## 2017-01-13 DIAGNOSIS — I10 Essential (primary) hypertension: Secondary | ICD-10-CM | POA: Diagnosis not present

## 2017-01-13 DIAGNOSIS — M1990 Unspecified osteoarthritis, unspecified site: Secondary | ICD-10-CM | POA: Diagnosis not present

## 2017-01-13 DIAGNOSIS — Z79899 Other long term (current) drug therapy: Secondary | ICD-10-CM | POA: Diagnosis not present

## 2017-01-13 DIAGNOSIS — Z885 Allergy status to narcotic agent status: Secondary | ICD-10-CM | POA: Diagnosis not present

## 2017-01-13 DIAGNOSIS — B957 Other staphylococcus as the cause of diseases classified elsewhere: Secondary | ICD-10-CM | POA: Diagnosis not present

## 2017-01-13 DIAGNOSIS — R45851 Suicidal ideations: Secondary | ICD-10-CM | POA: Diagnosis not present

## 2017-01-13 DIAGNOSIS — Z96651 Presence of right artificial knee joint: Secondary | ICD-10-CM | POA: Diagnosis not present

## 2017-01-13 DIAGNOSIS — R9431 Abnormal electrocardiogram [ECG] [EKG]: Secondary | ICD-10-CM | POA: Diagnosis not present

## 2017-01-13 DIAGNOSIS — T8454XD Infection and inflammatory reaction due to internal left knee prosthesis, subsequent encounter: Secondary | ICD-10-CM | POA: Diagnosis not present

## 2017-01-13 DIAGNOSIS — M009 Pyogenic arthritis, unspecified: Secondary | ICD-10-CM | POA: Diagnosis not present

## 2017-01-13 DIAGNOSIS — Z452 Encounter for adjustment and management of vascular access device: Secondary | ICD-10-CM | POA: Diagnosis not present

## 2017-01-17 DIAGNOSIS — G8918 Other acute postprocedural pain: Secondary | ICD-10-CM | POA: Diagnosis not present

## 2017-01-17 DIAGNOSIS — J439 Emphysema, unspecified: Secondary | ICD-10-CM | POA: Diagnosis not present

## 2017-01-17 DIAGNOSIS — D72829 Elevated white blood cell count, unspecified: Secondary | ICD-10-CM | POA: Diagnosis not present

## 2017-01-17 DIAGNOSIS — M00061 Staphylococcal arthritis, right knee: Secondary | ICD-10-CM | POA: Diagnosis not present

## 2017-01-17 DIAGNOSIS — M1711 Unilateral primary osteoarthritis, right knee: Secondary | ICD-10-CM | POA: Diagnosis not present

## 2017-01-17 DIAGNOSIS — Z96651 Presence of right artificial knee joint: Secondary | ICD-10-CM | POA: Diagnosis not present

## 2017-01-17 DIAGNOSIS — M25531 Pain in right wrist: Secondary | ICD-10-CM | POA: Diagnosis not present

## 2017-01-17 DIAGNOSIS — T8453XA Infection and inflammatory reaction due to internal right knee prosthesis, initial encounter: Secondary | ICD-10-CM | POA: Diagnosis not present

## 2017-01-17 DIAGNOSIS — M009 Pyogenic arthritis, unspecified: Secondary | ICD-10-CM | POA: Diagnosis not present

## 2017-01-17 DIAGNOSIS — R7881 Bacteremia: Secondary | ICD-10-CM | POA: Diagnosis not present

## 2017-01-17 DIAGNOSIS — D649 Anemia, unspecified: Secondary | ICD-10-CM | POA: Diagnosis not present

## 2017-01-17 DIAGNOSIS — E785 Hyperlipidemia, unspecified: Secondary | ICD-10-CM | POA: Diagnosis not present

## 2017-01-17 DIAGNOSIS — J45909 Unspecified asthma, uncomplicated: Secondary | ICD-10-CM | POA: Diagnosis not present

## 2017-01-17 DIAGNOSIS — Z886 Allergy status to analgesic agent status: Secondary | ICD-10-CM | POA: Diagnosis not present

## 2017-01-17 DIAGNOSIS — B957 Other staphylococcus as the cause of diseases classified elsewhere: Secondary | ICD-10-CM | POA: Diagnosis not present

## 2017-01-17 DIAGNOSIS — Z87891 Personal history of nicotine dependence: Secondary | ICD-10-CM | POA: Diagnosis not present

## 2017-01-17 DIAGNOSIS — T814XXA Infection following a procedure, initial encounter: Secondary | ICD-10-CM | POA: Diagnosis not present

## 2017-01-17 DIAGNOSIS — T8453XD Infection and inflammatory reaction due to internal right knee prosthesis, subsequent encounter: Secondary | ICD-10-CM | POA: Diagnosis not present

## 2017-01-17 DIAGNOSIS — T8454XD Infection and inflammatory reaction due to internal left knee prosthesis, subsequent encounter: Secondary | ICD-10-CM | POA: Diagnosis not present

## 2017-01-17 DIAGNOSIS — I2510 Atherosclerotic heart disease of native coronary artery without angina pectoris: Secondary | ICD-10-CM | POA: Diagnosis not present

## 2017-01-17 DIAGNOSIS — R262 Difficulty in walking, not elsewhere classified: Secondary | ICD-10-CM | POA: Diagnosis not present

## 2017-01-17 DIAGNOSIS — T3695XD Adverse effect of unspecified systemic antibiotic, subsequent encounter: Secondary | ICD-10-CM | POA: Diagnosis not present

## 2017-01-17 DIAGNOSIS — I10 Essential (primary) hypertension: Secondary | ICD-10-CM | POA: Diagnosis not present

## 2017-01-17 DIAGNOSIS — Z79899 Other long term (current) drug therapy: Secondary | ICD-10-CM | POA: Diagnosis not present

## 2017-01-17 DIAGNOSIS — R112 Nausea with vomiting, unspecified: Secondary | ICD-10-CM | POA: Diagnosis not present

## 2017-01-20 DIAGNOSIS — G8918 Other acute postprocedural pain: Secondary | ICD-10-CM | POA: Diagnosis not present

## 2017-01-20 DIAGNOSIS — M009 Pyogenic arthritis, unspecified: Secondary | ICD-10-CM | POA: Diagnosis not present

## 2017-01-20 DIAGNOSIS — R262 Difficulty in walking, not elsewhere classified: Secondary | ICD-10-CM | POA: Diagnosis not present

## 2017-01-20 DIAGNOSIS — D649 Anemia, unspecified: Secondary | ICD-10-CM | POA: Diagnosis not present

## 2017-02-10 DIAGNOSIS — Z96651 Presence of right artificial knee joint: Secondary | ICD-10-CM | POA: Diagnosis not present

## 2017-02-10 DIAGNOSIS — T814XXA Infection following a procedure, initial encounter: Secondary | ICD-10-CM | POA: Diagnosis not present

## 2017-02-10 DIAGNOSIS — M1711 Unilateral primary osteoarthritis, right knee: Secondary | ICD-10-CM | POA: Diagnosis not present

## 2017-02-11 DIAGNOSIS — Z87891 Personal history of nicotine dependence: Secondary | ICD-10-CM | POA: Diagnosis not present

## 2017-02-11 DIAGNOSIS — I10 Essential (primary) hypertension: Secondary | ICD-10-CM | POA: Diagnosis not present

## 2017-02-11 DIAGNOSIS — E785 Hyperlipidemia, unspecified: Secondary | ICD-10-CM | POA: Diagnosis not present

## 2017-02-11 DIAGNOSIS — T8453XD Infection and inflammatory reaction due to internal right knee prosthesis, subsequent encounter: Secondary | ICD-10-CM | POA: Diagnosis not present

## 2017-02-11 DIAGNOSIS — T3695XD Adverse effect of unspecified systemic antibiotic, subsequent encounter: Secondary | ICD-10-CM | POA: Diagnosis not present

## 2017-02-11 DIAGNOSIS — Z79899 Other long term (current) drug therapy: Secondary | ICD-10-CM | POA: Diagnosis not present

## 2017-02-11 DIAGNOSIS — R112 Nausea with vomiting, unspecified: Secondary | ICD-10-CM | POA: Diagnosis not present

## 2017-02-11 DIAGNOSIS — J45909 Unspecified asthma, uncomplicated: Secondary | ICD-10-CM | POA: Diagnosis not present

## 2017-02-11 DIAGNOSIS — Z886 Allergy status to analgesic agent status: Secondary | ICD-10-CM | POA: Diagnosis not present

## 2017-02-14 DIAGNOSIS — I10 Essential (primary) hypertension: Secondary | ICD-10-CM | POA: Diagnosis not present

## 2017-02-14 DIAGNOSIS — J439 Emphysema, unspecified: Secondary | ICD-10-CM | POA: Diagnosis not present

## 2017-02-14 DIAGNOSIS — T8453XA Infection and inflammatory reaction due to internal right knee prosthesis, initial encounter: Secondary | ICD-10-CM | POA: Diagnosis not present

## 2017-02-14 DIAGNOSIS — I2510 Atherosclerotic heart disease of native coronary artery without angina pectoris: Secondary | ICD-10-CM | POA: Diagnosis not present

## 2017-02-14 DIAGNOSIS — B957 Other staphylococcus as the cause of diseases classified elsewhere: Secondary | ICD-10-CM | POA: Diagnosis not present

## 2017-02-14 DIAGNOSIS — R7881 Bacteremia: Secondary | ICD-10-CM | POA: Diagnosis not present

## 2017-02-14 DIAGNOSIS — M00061 Staphylococcal arthritis, right knee: Secondary | ICD-10-CM | POA: Diagnosis not present

## 2017-02-15 DIAGNOSIS — R7881 Bacteremia: Secondary | ICD-10-CM | POA: Diagnosis not present

## 2017-02-16 DIAGNOSIS — R7881 Bacteremia: Secondary | ICD-10-CM | POA: Diagnosis not present

## 2017-02-17 DIAGNOSIS — R7881 Bacteremia: Secondary | ICD-10-CM | POA: Diagnosis not present

## 2017-02-18 DIAGNOSIS — R7881 Bacteremia: Secondary | ICD-10-CM | POA: Diagnosis not present

## 2017-02-19 DIAGNOSIS — J439 Emphysema, unspecified: Secondary | ICD-10-CM | POA: Diagnosis not present

## 2017-02-19 DIAGNOSIS — I10 Essential (primary) hypertension: Secondary | ICD-10-CM | POA: Diagnosis not present

## 2017-02-19 DIAGNOSIS — T8453XA Infection and inflammatory reaction due to internal right knee prosthesis, initial encounter: Secondary | ICD-10-CM | POA: Diagnosis not present

## 2017-02-19 DIAGNOSIS — R7881 Bacteremia: Secondary | ICD-10-CM | POA: Diagnosis not present

## 2017-02-19 DIAGNOSIS — I2510 Atherosclerotic heart disease of native coronary artery without angina pectoris: Secondary | ICD-10-CM | POA: Diagnosis not present

## 2017-02-19 DIAGNOSIS — M00061 Staphylococcal arthritis, right knee: Secondary | ICD-10-CM | POA: Diagnosis not present

## 2017-02-19 DIAGNOSIS — B957 Other staphylococcus as the cause of diseases classified elsewhere: Secondary | ICD-10-CM | POA: Diagnosis not present

## 2017-02-20 DIAGNOSIS — R7881 Bacteremia: Secondary | ICD-10-CM | POA: Diagnosis not present

## 2017-02-21 DIAGNOSIS — B957 Other staphylococcus as the cause of diseases classified elsewhere: Secondary | ICD-10-CM | POA: Diagnosis not present

## 2017-02-21 DIAGNOSIS — I2510 Atherosclerotic heart disease of native coronary artery without angina pectoris: Secondary | ICD-10-CM | POA: Diagnosis not present

## 2017-02-21 DIAGNOSIS — T8453XA Infection and inflammatory reaction due to internal right knee prosthesis, initial encounter: Secondary | ICD-10-CM | POA: Diagnosis not present

## 2017-02-21 DIAGNOSIS — M00061 Staphylococcal arthritis, right knee: Secondary | ICD-10-CM | POA: Diagnosis not present

## 2017-02-21 DIAGNOSIS — J439 Emphysema, unspecified: Secondary | ICD-10-CM | POA: Diagnosis not present

## 2017-02-21 DIAGNOSIS — R7881 Bacteremia: Secondary | ICD-10-CM | POA: Diagnosis not present

## 2017-02-21 DIAGNOSIS — I10 Essential (primary) hypertension: Secondary | ICD-10-CM | POA: Diagnosis not present

## 2017-02-22 DIAGNOSIS — R7881 Bacteremia: Secondary | ICD-10-CM | POA: Diagnosis not present

## 2017-02-23 DIAGNOSIS — R7881 Bacteremia: Secondary | ICD-10-CM | POA: Diagnosis not present

## 2017-02-24 DIAGNOSIS — R7881 Bacteremia: Secondary | ICD-10-CM | POA: Diagnosis not present

## 2017-02-25 DIAGNOSIS — T8453XA Infection and inflammatory reaction due to internal right knee prosthesis, initial encounter: Secondary | ICD-10-CM | POA: Diagnosis not present

## 2017-02-25 DIAGNOSIS — M00061 Staphylococcal arthritis, right knee: Secondary | ICD-10-CM | POA: Diagnosis not present

## 2017-02-25 DIAGNOSIS — B957 Other staphylococcus as the cause of diseases classified elsewhere: Secondary | ICD-10-CM | POA: Diagnosis not present

## 2017-02-25 DIAGNOSIS — I10 Essential (primary) hypertension: Secondary | ICD-10-CM | POA: Diagnosis not present

## 2017-02-25 DIAGNOSIS — J439 Emphysema, unspecified: Secondary | ICD-10-CM | POA: Diagnosis not present

## 2017-02-25 DIAGNOSIS — I2510 Atherosclerotic heart disease of native coronary artery without angina pectoris: Secondary | ICD-10-CM | POA: Diagnosis not present

## 2017-03-07 DIAGNOSIS — F411 Generalized anxiety disorder: Secondary | ICD-10-CM | POA: Diagnosis not present

## 2017-03-07 DIAGNOSIS — I471 Supraventricular tachycardia: Secondary | ICD-10-CM | POA: Diagnosis not present

## 2017-03-07 DIAGNOSIS — R5381 Other malaise: Secondary | ICD-10-CM | POA: Diagnosis not present

## 2017-03-07 DIAGNOSIS — R112 Nausea with vomiting, unspecified: Secondary | ICD-10-CM | POA: Diagnosis not present

## 2017-03-07 DIAGNOSIS — I10 Essential (primary) hypertension: Secondary | ICD-10-CM | POA: Diagnosis not present

## 2017-03-10 DIAGNOSIS — T814XXA Infection following a procedure, initial encounter: Secondary | ICD-10-CM | POA: Diagnosis not present

## 2017-03-13 DIAGNOSIS — J45909 Unspecified asthma, uncomplicated: Secondary | ICD-10-CM | POA: Diagnosis not present

## 2017-03-14 DIAGNOSIS — E876 Hypokalemia: Secondary | ICD-10-CM | POA: Diagnosis not present

## 2017-03-14 DIAGNOSIS — D539 Nutritional anemia, unspecified: Secondary | ICD-10-CM | POA: Diagnosis not present

## 2017-03-14 DIAGNOSIS — D510 Vitamin B12 deficiency anemia due to intrinsic factor deficiency: Secondary | ICD-10-CM | POA: Diagnosis not present

## 2017-03-21 DIAGNOSIS — D510 Vitamin B12 deficiency anemia due to intrinsic factor deficiency: Secondary | ICD-10-CM | POA: Diagnosis not present

## 2017-03-28 DIAGNOSIS — D510 Vitamin B12 deficiency anemia due to intrinsic factor deficiency: Secondary | ICD-10-CM | POA: Diagnosis not present

## 2017-04-01 DIAGNOSIS — H26491 Other secondary cataract, right eye: Secondary | ICD-10-CM | POA: Diagnosis not present

## 2017-04-07 DIAGNOSIS — M25562 Pain in left knee: Secondary | ICD-10-CM | POA: Diagnosis not present

## 2017-04-07 DIAGNOSIS — R5381 Other malaise: Secondary | ICD-10-CM | POA: Diagnosis not present

## 2017-04-07 DIAGNOSIS — Z139 Encounter for screening, unspecified: Secondary | ICD-10-CM | POA: Diagnosis not present

## 2017-04-07 DIAGNOSIS — Z6822 Body mass index (BMI) 22.0-22.9, adult: Secondary | ICD-10-CM | POA: Diagnosis not present

## 2017-04-13 DIAGNOSIS — J45909 Unspecified asthma, uncomplicated: Secondary | ICD-10-CM | POA: Diagnosis not present

## 2017-04-15 DIAGNOSIS — M1712 Unilateral primary osteoarthritis, left knee: Secondary | ICD-10-CM | POA: Diagnosis not present

## 2017-04-15 DIAGNOSIS — Z96651 Presence of right artificial knee joint: Secondary | ICD-10-CM | POA: Diagnosis not present

## 2017-04-15 DIAGNOSIS — T814XXA Infection following a procedure, initial encounter: Secondary | ICD-10-CM | POA: Diagnosis not present

## 2017-04-22 DIAGNOSIS — I10 Essential (primary) hypertension: Secondary | ICD-10-CM | POA: Diagnosis not present

## 2017-04-22 DIAGNOSIS — Z886 Allergy status to analgesic agent status: Secondary | ICD-10-CM | POA: Diagnosis not present

## 2017-04-22 DIAGNOSIS — T8453XD Infection and inflammatory reaction due to internal right knee prosthesis, subsequent encounter: Secondary | ICD-10-CM | POA: Diagnosis not present

## 2017-04-22 DIAGNOSIS — T8453XA Infection and inflammatory reaction due to internal right knee prosthesis, initial encounter: Secondary | ICD-10-CM | POA: Diagnosis not present

## 2017-04-22 DIAGNOSIS — Z87891 Personal history of nicotine dependence: Secondary | ICD-10-CM | POA: Diagnosis not present

## 2017-04-22 DIAGNOSIS — Z96651 Presence of right artificial knee joint: Secondary | ICD-10-CM | POA: Diagnosis not present

## 2017-04-22 DIAGNOSIS — E785 Hyperlipidemia, unspecified: Secondary | ICD-10-CM | POA: Diagnosis not present

## 2017-04-22 DIAGNOSIS — J45909 Unspecified asthma, uncomplicated: Secondary | ICD-10-CM | POA: Diagnosis not present

## 2017-04-22 DIAGNOSIS — Z79899 Other long term (current) drug therapy: Secondary | ICD-10-CM | POA: Diagnosis not present

## 2017-04-22 DIAGNOSIS — Z888 Allergy status to other drugs, medicaments and biological substances status: Secondary | ICD-10-CM | POA: Diagnosis not present

## 2017-05-14 DIAGNOSIS — J45909 Unspecified asthma, uncomplicated: Secondary | ICD-10-CM | POA: Diagnosis not present

## 2017-05-15 DIAGNOSIS — J45901 Unspecified asthma with (acute) exacerbation: Secondary | ICD-10-CM | POA: Diagnosis not present

## 2017-05-15 DIAGNOSIS — D539 Nutritional anemia, unspecified: Secondary | ICD-10-CM | POA: Diagnosis not present

## 2017-06-13 DIAGNOSIS — J45909 Unspecified asthma, uncomplicated: Secondary | ICD-10-CM | POA: Diagnosis not present

## 2017-07-14 DIAGNOSIS — J45909 Unspecified asthma, uncomplicated: Secondary | ICD-10-CM | POA: Diagnosis not present

## 2017-08-13 DIAGNOSIS — J45909 Unspecified asthma, uncomplicated: Secondary | ICD-10-CM | POA: Diagnosis not present

## 2017-09-13 DIAGNOSIS — J45909 Unspecified asthma, uncomplicated: Secondary | ICD-10-CM | POA: Diagnosis not present

## 2017-09-15 DIAGNOSIS — T8140XA Infection following a procedure, unspecified, initial encounter: Secondary | ICD-10-CM | POA: Diagnosis not present

## 2017-09-15 DIAGNOSIS — Z96651 Presence of right artificial knee joint: Secondary | ICD-10-CM | POA: Diagnosis not present

## 2017-09-18 DIAGNOSIS — E119 Type 2 diabetes mellitus without complications: Secondary | ICD-10-CM | POA: Diagnosis not present

## 2017-09-18 DIAGNOSIS — Z9181 History of falling: Secondary | ICD-10-CM | POA: Diagnosis not present

## 2017-09-18 DIAGNOSIS — Z0000 Encounter for general adult medical examination without abnormal findings: Secondary | ICD-10-CM | POA: Diagnosis not present

## 2017-09-18 DIAGNOSIS — Z1231 Encounter for screening mammogram for malignant neoplasm of breast: Secondary | ICD-10-CM | POA: Diagnosis not present

## 2017-09-18 DIAGNOSIS — I10 Essential (primary) hypertension: Secondary | ICD-10-CM | POA: Diagnosis not present

## 2017-09-18 DIAGNOSIS — E785 Hyperlipidemia, unspecified: Secondary | ICD-10-CM | POA: Diagnosis not present

## 2017-09-18 DIAGNOSIS — Z1331 Encounter for screening for depression: Secondary | ICD-10-CM | POA: Diagnosis not present

## 2017-09-23 DIAGNOSIS — E119 Type 2 diabetes mellitus without complications: Secondary | ICD-10-CM | POA: Diagnosis not present

## 2017-09-23 DIAGNOSIS — I10 Essential (primary) hypertension: Secondary | ICD-10-CM | POA: Diagnosis not present

## 2017-09-23 DIAGNOSIS — J4540 Moderate persistent asthma, uncomplicated: Secondary | ICD-10-CM | POA: Diagnosis not present

## 2017-09-23 DIAGNOSIS — N390 Urinary tract infection, site not specified: Secondary | ICD-10-CM | POA: Diagnosis not present

## 2017-09-23 DIAGNOSIS — R945 Abnormal results of liver function studies: Secondary | ICD-10-CM | POA: Diagnosis not present

## 2017-09-23 DIAGNOSIS — Z6823 Body mass index (BMI) 23.0-23.9, adult: Secondary | ICD-10-CM | POA: Diagnosis not present

## 2017-09-23 DIAGNOSIS — I471 Supraventricular tachycardia: Secondary | ICD-10-CM | POA: Diagnosis not present

## 2017-09-23 DIAGNOSIS — E785 Hyperlipidemia, unspecified: Secondary | ICD-10-CM | POA: Diagnosis not present

## 2017-10-07 DIAGNOSIS — R634 Abnormal weight loss: Secondary | ICD-10-CM | POA: Diagnosis not present

## 2017-10-07 DIAGNOSIS — R945 Abnormal results of liver function studies: Secondary | ICD-10-CM | POA: Diagnosis not present

## 2017-10-07 DIAGNOSIS — R197 Diarrhea, unspecified: Secondary | ICD-10-CM | POA: Diagnosis not present

## 2017-10-14 DIAGNOSIS — J45909 Unspecified asthma, uncomplicated: Secondary | ICD-10-CM | POA: Diagnosis not present

## 2017-10-20 DIAGNOSIS — H26493 Other secondary cataract, bilateral: Secondary | ICD-10-CM | POA: Diagnosis not present

## 2017-10-28 DIAGNOSIS — Z789 Other specified health status: Secondary | ICD-10-CM

## 2017-10-28 DIAGNOSIS — Z7409 Other reduced mobility: Secondary | ICD-10-CM | POA: Insufficient documentation

## 2017-10-28 DIAGNOSIS — Z1231 Encounter for screening mammogram for malignant neoplasm of breast: Secondary | ICD-10-CM | POA: Diagnosis not present

## 2017-10-28 HISTORY — DX: Other specified health status: Z78.9

## 2017-10-28 HISTORY — DX: Other reduced mobility: Z74.09

## 2017-10-30 ENCOUNTER — Ambulatory Visit: Payer: Medicare HMO | Admitting: Cardiology

## 2017-11-06 DIAGNOSIS — R634 Abnormal weight loss: Secondary | ICD-10-CM | POA: Diagnosis not present

## 2017-11-06 DIAGNOSIS — R945 Abnormal results of liver function studies: Secondary | ICD-10-CM | POA: Diagnosis not present

## 2017-11-11 DIAGNOSIS — K7689 Other specified diseases of liver: Secondary | ICD-10-CM | POA: Diagnosis not present

## 2017-11-11 DIAGNOSIS — J45909 Unspecified asthma, uncomplicated: Secondary | ICD-10-CM | POA: Diagnosis not present

## 2017-11-12 ENCOUNTER — Encounter: Payer: Self-pay | Admitting: *Deleted

## 2017-11-17 DIAGNOSIS — I10 Essential (primary) hypertension: Secondary | ICD-10-CM | POA: Diagnosis not present

## 2017-11-17 DIAGNOSIS — I471 Supraventricular tachycardia: Secondary | ICD-10-CM | POA: Diagnosis not present

## 2017-11-17 DIAGNOSIS — I498 Other specified cardiac arrhythmias: Secondary | ICD-10-CM

## 2017-11-17 DIAGNOSIS — E785 Hyperlipidemia, unspecified: Secondary | ICD-10-CM | POA: Diagnosis not present

## 2017-11-17 DIAGNOSIS — R002 Palpitations: Secondary | ICD-10-CM | POA: Diagnosis not present

## 2017-11-17 HISTORY — DX: Other specified cardiac arrhythmias: I49.8

## 2017-11-18 DIAGNOSIS — I471 Supraventricular tachycardia: Secondary | ICD-10-CM | POA: Diagnosis not present

## 2017-11-18 DIAGNOSIS — R002 Palpitations: Secondary | ICD-10-CM | POA: Diagnosis not present

## 2017-11-18 DIAGNOSIS — E785 Hyperlipidemia, unspecified: Secondary | ICD-10-CM | POA: Diagnosis not present

## 2017-11-18 DIAGNOSIS — I10 Essential (primary) hypertension: Secondary | ICD-10-CM | POA: Diagnosis not present

## 2017-11-25 DIAGNOSIS — R197 Diarrhea, unspecified: Secondary | ICD-10-CM | POA: Diagnosis not present

## 2017-11-25 DIAGNOSIS — R945 Abnormal results of liver function studies: Secondary | ICD-10-CM | POA: Diagnosis not present

## 2017-11-25 DIAGNOSIS — K760 Fatty (change of) liver, not elsewhere classified: Secondary | ICD-10-CM | POA: Diagnosis not present

## 2017-12-03 DIAGNOSIS — L578 Other skin changes due to chronic exposure to nonionizing radiation: Secondary | ICD-10-CM | POA: Diagnosis not present

## 2017-12-03 DIAGNOSIS — L821 Other seborrheic keratosis: Secondary | ICD-10-CM | POA: Diagnosis not present

## 2017-12-03 DIAGNOSIS — L570 Actinic keratosis: Secondary | ICD-10-CM | POA: Diagnosis not present

## 2017-12-05 DIAGNOSIS — R002 Palpitations: Secondary | ICD-10-CM | POA: Diagnosis not present

## 2017-12-05 DIAGNOSIS — I471 Supraventricular tachycardia: Secondary | ICD-10-CM | POA: Diagnosis not present

## 2017-12-12 DIAGNOSIS — J45909 Unspecified asthma, uncomplicated: Secondary | ICD-10-CM | POA: Diagnosis not present

## 2017-12-16 DIAGNOSIS — I10 Essential (primary) hypertension: Secondary | ICD-10-CM | POA: Diagnosis not present

## 2017-12-16 DIAGNOSIS — I471 Supraventricular tachycardia: Secondary | ICD-10-CM | POA: Diagnosis not present

## 2017-12-16 DIAGNOSIS — E785 Hyperlipidemia, unspecified: Secondary | ICD-10-CM | POA: Diagnosis not present

## 2017-12-16 DIAGNOSIS — I498 Other specified cardiac arrhythmias: Secondary | ICD-10-CM | POA: Diagnosis not present

## 2017-12-16 DIAGNOSIS — R002 Palpitations: Secondary | ICD-10-CM | POA: Diagnosis not present

## 2017-12-18 DIAGNOSIS — M1990 Unspecified osteoarthritis, unspecified site: Secondary | ICD-10-CM | POA: Diagnosis not present

## 2017-12-18 DIAGNOSIS — E785 Hyperlipidemia, unspecified: Secondary | ICD-10-CM | POA: Diagnosis not present

## 2017-12-18 DIAGNOSIS — J45901 Unspecified asthma with (acute) exacerbation: Secondary | ICD-10-CM | POA: Diagnosis not present

## 2017-12-18 DIAGNOSIS — I10 Essential (primary) hypertension: Secondary | ICD-10-CM | POA: Diagnosis not present

## 2017-12-18 DIAGNOSIS — R69 Illness, unspecified: Secondary | ICD-10-CM | POA: Diagnosis not present

## 2017-12-18 DIAGNOSIS — I471 Supraventricular tachycardia: Secondary | ICD-10-CM | POA: Diagnosis not present

## 2017-12-18 DIAGNOSIS — Z6822 Body mass index (BMI) 22.0-22.9, adult: Secondary | ICD-10-CM | POA: Diagnosis not present

## 2017-12-29 DIAGNOSIS — J4540 Moderate persistent asthma, uncomplicated: Secondary | ICD-10-CM | POA: Diagnosis not present

## 2018-01-11 DIAGNOSIS — J45909 Unspecified asthma, uncomplicated: Secondary | ICD-10-CM | POA: Diagnosis not present

## 2018-01-16 DIAGNOSIS — R69 Illness, unspecified: Secondary | ICD-10-CM | POA: Diagnosis not present

## 2018-01-22 DIAGNOSIS — R69 Illness, unspecified: Secondary | ICD-10-CM | POA: Diagnosis not present

## 2018-01-26 DIAGNOSIS — R69 Illness, unspecified: Secondary | ICD-10-CM | POA: Diagnosis not present

## 2018-01-27 DIAGNOSIS — J208 Acute bronchitis due to other specified organisms: Secondary | ICD-10-CM | POA: Diagnosis not present

## 2018-01-27 DIAGNOSIS — Z6822 Body mass index (BMI) 22.0-22.9, adult: Secondary | ICD-10-CM | POA: Diagnosis not present

## 2018-01-27 DIAGNOSIS — J45901 Unspecified asthma with (acute) exacerbation: Secondary | ICD-10-CM | POA: Diagnosis not present

## 2018-02-11 DIAGNOSIS — J45909 Unspecified asthma, uncomplicated: Secondary | ICD-10-CM | POA: Diagnosis not present

## 2018-03-13 DIAGNOSIS — J45909 Unspecified asthma, uncomplicated: Secondary | ICD-10-CM | POA: Diagnosis not present

## 2018-03-19 DIAGNOSIS — E119 Type 2 diabetes mellitus without complications: Secondary | ICD-10-CM | POA: Diagnosis not present

## 2018-03-19 DIAGNOSIS — R51 Headache: Secondary | ICD-10-CM | POA: Diagnosis not present

## 2018-03-19 DIAGNOSIS — E785 Hyperlipidemia, unspecified: Secondary | ICD-10-CM | POA: Diagnosis not present

## 2018-03-19 DIAGNOSIS — J4540 Moderate persistent asthma, uncomplicated: Secondary | ICD-10-CM | POA: Diagnosis not present

## 2018-03-19 DIAGNOSIS — I10 Essential (primary) hypertension: Secondary | ICD-10-CM | POA: Diagnosis not present

## 2018-03-19 DIAGNOSIS — Z6822 Body mass index (BMI) 22.0-22.9, adult: Secondary | ICD-10-CM | POA: Diagnosis not present

## 2018-03-19 DIAGNOSIS — M1990 Unspecified osteoarthritis, unspecified site: Secondary | ICD-10-CM | POA: Diagnosis not present

## 2018-03-23 DIAGNOSIS — R51 Headache: Secondary | ICD-10-CM | POA: Diagnosis not present

## 2018-04-02 DIAGNOSIS — N390 Urinary tract infection, site not specified: Secondary | ICD-10-CM | POA: Diagnosis not present

## 2018-04-02 DIAGNOSIS — J4541 Moderate persistent asthma with (acute) exacerbation: Secondary | ICD-10-CM | POA: Diagnosis not present

## 2018-04-02 DIAGNOSIS — Z6822 Body mass index (BMI) 22.0-22.9, adult: Secondary | ICD-10-CM | POA: Diagnosis not present

## 2018-04-02 DIAGNOSIS — R69 Illness, unspecified: Secondary | ICD-10-CM | POA: Diagnosis not present

## 2018-04-03 DIAGNOSIS — N390 Urinary tract infection, site not specified: Secondary | ICD-10-CM | POA: Diagnosis not present

## 2018-04-13 DIAGNOSIS — J45909 Unspecified asthma, uncomplicated: Secondary | ICD-10-CM | POA: Diagnosis not present

## 2018-04-29 DIAGNOSIS — R69 Illness, unspecified: Secondary | ICD-10-CM | POA: Diagnosis not present

## 2018-05-07 DIAGNOSIS — N390 Urinary tract infection, site not specified: Secondary | ICD-10-CM | POA: Diagnosis not present

## 2018-05-07 DIAGNOSIS — R300 Dysuria: Secondary | ICD-10-CM | POA: Diagnosis not present

## 2018-05-07 DIAGNOSIS — R945 Abnormal results of liver function studies: Secondary | ICD-10-CM | POA: Diagnosis not present

## 2018-05-07 DIAGNOSIS — E785 Hyperlipidemia, unspecified: Secondary | ICD-10-CM | POA: Diagnosis not present

## 2018-05-07 DIAGNOSIS — Z6822 Body mass index (BMI) 22.0-22.9, adult: Secondary | ICD-10-CM | POA: Diagnosis not present

## 2018-05-12 DIAGNOSIS — I471 Supraventricular tachycardia: Secondary | ICD-10-CM | POA: Diagnosis not present

## 2018-05-12 DIAGNOSIS — I498 Other specified cardiac arrhythmias: Secondary | ICD-10-CM | POA: Diagnosis not present

## 2018-05-12 DIAGNOSIS — E785 Hyperlipidemia, unspecified: Secondary | ICD-10-CM | POA: Diagnosis not present

## 2018-05-12 DIAGNOSIS — I10 Essential (primary) hypertension: Secondary | ICD-10-CM | POA: Diagnosis not present

## 2018-05-14 DIAGNOSIS — J45909 Unspecified asthma, uncomplicated: Secondary | ICD-10-CM | POA: Diagnosis not present

## 2018-05-14 DIAGNOSIS — R69 Illness, unspecified: Secondary | ICD-10-CM | POA: Diagnosis not present

## 2018-05-20 DIAGNOSIS — R69 Illness, unspecified: Secondary | ICD-10-CM | POA: Diagnosis not present

## 2018-06-09 DIAGNOSIS — R69 Illness, unspecified: Secondary | ICD-10-CM | POA: Diagnosis not present

## 2018-06-13 DIAGNOSIS — J45909 Unspecified asthma, uncomplicated: Secondary | ICD-10-CM | POA: Diagnosis not present

## 2018-06-23 DIAGNOSIS — N390 Urinary tract infection, site not specified: Secondary | ICD-10-CM | POA: Diagnosis not present

## 2018-06-23 DIAGNOSIS — N3289 Other specified disorders of bladder: Secondary | ICD-10-CM | POA: Diagnosis not present

## 2018-06-23 DIAGNOSIS — J45901 Unspecified asthma with (acute) exacerbation: Secondary | ICD-10-CM | POA: Diagnosis not present

## 2018-07-13 DIAGNOSIS — R197 Diarrhea, unspecified: Secondary | ICD-10-CM | POA: Diagnosis not present

## 2018-07-13 DIAGNOSIS — Z6823 Body mass index (BMI) 23.0-23.9, adult: Secondary | ICD-10-CM | POA: Diagnosis not present

## 2018-07-13 DIAGNOSIS — R159 Full incontinence of feces: Secondary | ICD-10-CM | POA: Diagnosis not present

## 2018-07-14 DIAGNOSIS — J45909 Unspecified asthma, uncomplicated: Secondary | ICD-10-CM | POA: Diagnosis not present

## 2018-07-24 DIAGNOSIS — I4891 Unspecified atrial fibrillation: Secondary | ICD-10-CM | POA: Diagnosis not present

## 2018-07-24 DIAGNOSIS — I482 Chronic atrial fibrillation, unspecified: Secondary | ICD-10-CM | POA: Diagnosis not present

## 2018-07-24 DIAGNOSIS — R000 Tachycardia, unspecified: Secondary | ICD-10-CM | POA: Diagnosis not present

## 2018-07-28 DIAGNOSIS — I10 Essential (primary) hypertension: Secondary | ICD-10-CM | POA: Diagnosis not present

## 2018-07-28 DIAGNOSIS — I471 Supraventricular tachycardia: Secondary | ICD-10-CM | POA: Diagnosis not present

## 2018-07-28 DIAGNOSIS — I498 Other specified cardiac arrhythmias: Secondary | ICD-10-CM | POA: Diagnosis not present

## 2018-07-28 DIAGNOSIS — E785 Hyperlipidemia, unspecified: Secondary | ICD-10-CM | POA: Diagnosis not present

## 2018-08-03 DIAGNOSIS — R69 Illness, unspecified: Secondary | ICD-10-CM | POA: Diagnosis not present

## 2018-08-11 DIAGNOSIS — I471 Supraventricular tachycardia: Secondary | ICD-10-CM | POA: Diagnosis not present

## 2018-08-11 DIAGNOSIS — I498 Other specified cardiac arrhythmias: Secondary | ICD-10-CM | POA: Diagnosis not present

## 2018-08-11 DIAGNOSIS — I10 Essential (primary) hypertension: Secondary | ICD-10-CM | POA: Diagnosis not present

## 2018-08-11 DIAGNOSIS — R002 Palpitations: Secondary | ICD-10-CM | POA: Diagnosis not present

## 2018-08-11 DIAGNOSIS — Z7409 Other reduced mobility: Secondary | ICD-10-CM | POA: Diagnosis not present

## 2018-08-11 DIAGNOSIS — R0789 Other chest pain: Secondary | ICD-10-CM | POA: Diagnosis not present

## 2018-08-11 DIAGNOSIS — S065X9A Traumatic subdural hemorrhage with loss of consciousness of unspecified duration, initial encounter: Secondary | ICD-10-CM | POA: Diagnosis not present

## 2018-08-11 DIAGNOSIS — E785 Hyperlipidemia, unspecified: Secondary | ICD-10-CM | POA: Diagnosis not present

## 2018-08-13 DIAGNOSIS — J45909 Unspecified asthma, uncomplicated: Secondary | ICD-10-CM | POA: Diagnosis not present

## 2018-08-17 DIAGNOSIS — J45909 Unspecified asthma, uncomplicated: Secondary | ICD-10-CM | POA: Diagnosis not present

## 2018-08-25 DIAGNOSIS — I471 Supraventricular tachycardia: Secondary | ICD-10-CM | POA: Diagnosis not present

## 2018-08-25 DIAGNOSIS — R079 Chest pain, unspecified: Secondary | ICD-10-CM | POA: Diagnosis not present

## 2018-09-13 DIAGNOSIS — J45909 Unspecified asthma, uncomplicated: Secondary | ICD-10-CM | POA: Diagnosis not present

## 2018-09-15 DIAGNOSIS — Z6824 Body mass index (BMI) 24.0-24.9, adult: Secondary | ICD-10-CM | POA: Diagnosis not present

## 2018-09-15 DIAGNOSIS — I10 Essential (primary) hypertension: Secondary | ICD-10-CM | POA: Diagnosis not present

## 2018-09-15 DIAGNOSIS — I471 Supraventricular tachycardia: Secondary | ICD-10-CM | POA: Diagnosis not present

## 2018-09-30 ENCOUNTER — Ambulatory Visit: Payer: Medicare HMO | Admitting: Cardiovascular Disease

## 2018-09-30 ENCOUNTER — Encounter (INDEPENDENT_AMBULATORY_CARE_PROVIDER_SITE_OTHER): Payer: Self-pay

## 2018-09-30 VITALS — BP 128/82 | Ht 61.0 in | Wt 132.0 lb

## 2018-09-30 DIAGNOSIS — I471 Supraventricular tachycardia: Secondary | ICD-10-CM | POA: Diagnosis not present

## 2018-09-30 DIAGNOSIS — I208 Other forms of angina pectoris: Secondary | ICD-10-CM

## 2018-09-30 DIAGNOSIS — E7800 Pure hypercholesterolemia, unspecified: Secondary | ICD-10-CM | POA: Diagnosis not present

## 2018-09-30 DIAGNOSIS — I10 Essential (primary) hypertension: Secondary | ICD-10-CM | POA: Diagnosis not present

## 2018-09-30 NOTE — Patient Instructions (Signed)
Medication Instructions:  Your physician recommends that you continue on your current medications as directed. Please refer to the Current Medication list given to you today.  If you need a refill on your cardiac medications before your next appointment, please call your pharmacy.   Lab work: None ordered If you have labs (blood work) drawn today and your tests are completely normal, you will receive your results only by: Marland Kitchen MyChart Message (if you have MyChart) OR . A paper copy in the mail If you have any lab test that is abnormal or we need to change your treatment, we will call you to review the results.  Testing/Procedures: None ordered  Follow-Up: At [facility], you and your health needs are our priority.  As part of our continuing mission to provide you with exceptional heart care, we have created designated Provider Care Teams.  These Care Teams include your primary Cardiologist (physician) and Advanced Practice Providers (APPs -  Physician Assistants and Nurse Practitioners) who all work together to provide you with the care you need, when you need it. You will need a follow up appointment in  as needed with Dr.Croitoru.    You have been referred to EP for a consult with Dr.Allred or Dr.Taylor. Their scheduler will call you directly to schedule the appointment  Any Other Special Instructions Will Be Listed Below (If Applicable). We will request your cardiac testing records from [facility]

## 2018-09-30 NOTE — Progress Notes (Signed)
Cardiology consultation note:    Date:  10/03/2018   ID:  Angela Phillips, DOB [birth year], MRN [id number]  PCP:  Lowella Dandy, NP  Cardiologist:  No primary care provider on file.  Electrophysiologist:  None   Referring MD: Lowella Dandy, NP   Chief Complaint  Patient presents with  . Tachycardia    Second opinion regarding ablation  Angela Phillips is a 73 y.o. female who is being seen today for the evaluation of tachycardia at the request of Moon, Amy A, NP.   History of Present Illness:    Angela Phillips is a 73 y.o. female with a hx of increasingly frequent episodes of abrupt onset tachycardia.  She first began experiencing rapid palpitations around the year 2000 and they have been increasing in frequency now occurring about twice a month.  Treatment with medications (sounds like she is tried both beta-blockers and calcium channel blockers) was poorly tolerated due to symptomatic bradycardia and junctional escape rhythm.  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  In mid November 2019 she had abrupt onset of tachycardia reaching a rate of 218 bpm associated weakness, presyncope and chest tightness.  She went to the emergency room in [place].  I do not have the records but it sounds like she received "a shot" (adenosine?) and converted to sinus rhythm.   She saw Dr. Donnetta Hutching. He told her that she might need a permanent pacemaker in order to start antiarrhythmic medications.  He referred her to EP, where ablation was discussed.  The ECG tracings are not available for review, but notes from Dr. Minna Merritts report long RP tachycardia with a rate of 156 bpm and right bundle branch block morphology.  His differential diagnosis was "atrial tachycardia versus accelerated junctional tachycardia or atypical AV node reentry tachycardia".  He  recommended EP study and ablation.  He also reports another tracing from March 2019 that showed junctional rhythm ("A-V dissociation, ventricular rate faster than atrial rate").  She has had 2 more episodes of tachycardia in December but neither one was bad enough to take her to the emergency room.  The symptoms occurred abruptly at rest, without any obvious trigger.  She has tried the Valsalva maneuver, unsuccessfully.  She has a history of hyperlipidemia and well-controlled hypertension as well as gastroesophageal reflux disease and previous right total knee replacement.  She tells me that she saw Dr. Geraldo Pitter in [place] maybe 15 years ago and had a cardiac catheterization that did not show any blockages.    CareEverywhere shows that she had an echocardiogram in November 2017 and a nuclear stress test in December 2017: those studies and their reports are not available for review.  She believes they were "normal".  Past Medical History:  Diagnosis Date  . Asthma   . Dyslipidemia (high LDL; low HDL) 08/13/2016  . HTN (hypertension) 11/20/2016  . Paroxysmal SVT (supraventricular tachycardia) ([place]) 07/05/2016    Past Surgical History:  Procedure Laterality Date  . ABDOMINAL HYSTERECTOMY    . CARDIAC CATHETERIZATION    . CARPAL TUNNEL RELEASE    . CATARACT EXTRACTION    . CHOLECYSTECTOMY    . TOE SURGERY      Current Medications: Current Meds  Medication Sig  . Biotin (SUPER BIOTIN) 5 MG TABS Take by mouth.  . clonazePAM (KLONOPIN) 1 MG tablet Take  1 mg by mouth.  . docusate sodium (COLACE) 100 MG capsule Take 100 mg by mouth.  Marland Kitchen FLUoxetine (PROZAC) 20 MG capsule Take 20 mg by mouth 2 (two) times daily.  Marland Kitchen lisinopril (PRINIVIL,ZESTRIL) 10 MG tablet Take 10 mg by mouth.  . losartan-hydrochlorothiazide (HYZAAR) 50-12.5 MG tablet Take 1 tablet by mouth daily.  . montelukast (SINGULAIR) 10 MG tablet Take 10 mg by mouth.  . Multiple Vitamin (MULTI-VITAMINS) TABS Take by mouth.  .  nabumetone (RELAFEN) 750 MG tablet Take 750 mg by mouth 2 (two) times daily.  Marland Kitchen omeprazole (PRILOSEC) 20 MG capsule Take 20 mg by mouth.  . pantoprazole (PROTONIX) 40 MG tablet Take 40 mg by mouth.  . trimethoprim (TRIMPEX) 100 MG tablet Take 100 mg by mouth daily.     Allergies:   Aspirin; Buspirone; Celecoxib; Chlorpheniramine; Citalopram; Ezetimibe; Hydrocodone-acetaminophen; Prochlorperazine; Statins; and Venlafaxine   Social History   Socioeconomic History  . Marital status: Married    Spouse name: Not on file  . Number of children: Not on file  . Years of education: Not on file  . Highest education level: Not on file  Occupational History  . Not on file  Social Needs  . Financial resource strain: Not on file  . Food insecurity:    Worry: Not on file    Inability: Not on file  . Transportation needs:    Medical: Not on file    Non-medical: Not on file  Tobacco Use  . Smoking status: Former Research scientist (life sciences)  . Smokeless tobacco: Never Used  Substance and Sexual Activity  . Alcohol use: No    Frequency: Never  . Drug use: No  . Sexual activity: Not on file  Lifestyle  . Physical activity:    Days per week: Not on file    Minutes per session: Not on file  . Stress: Not on file  Relationships  . Social connections:    Talks on phone: Not on file    Gets together: Not on file    Attends religious service: Not on file    Active member of club or organization: Not on file    Attends meetings of clubs or organizations: Not on file    Relationship status: Not on file  Other Topics Concern  . Not on file  Social History Narrative  . Not on file     Family History: The patient's family history includes Dementia in her father; Diabetes in her mother; Heart disease in her mother; Thyroid disease in her brother and sister.  ROS:   Please see the history of present illness.     All other systems reviewed and are negative.  EKGs/Labs/Other Studies Reviewed:    The following  studies were reviewed today: Notes from Dr. Minna Merritts.   EKG:  EKG is ordered today.  The ekg ordered today demonstrates sinus rhythm with a borderline short PR interval that I measure right axis 120 ms, incomplete right bundle branch block, nonspecific T wave flattening in leads V3-V4, QTC 441 ms  Recent Labs: No results found for requested labs within last 8760 hours.  Recent Lipid Panel No results found for: CHOL, TRIG, HDL, CHOLHDL, VLDL, LDLCALC, LDLDIRECT  Physical Exam:    VS:  BP 128/82 (BP Location: Right Arm)   Ht 5\' 1"  (1.549 m)   Wt 132 lb (59.9 kg)   BMI 24.94 kg/m     Wt Readings from Last 3 Encounters:  09/30/18 132 lb (59.9 kg)  11/20/16 148  lb (67.1 kg)     GEN:  Well nourished, well developed in no acute distress HEENT: Normal NECK: No JVD; No carotid bruits LYMPHATICS: No lymphadenopathy CARDIAC: RRR, no murmurs, rubs, gallops RESPIRATORY:  Clear to auscultation without rales, wheezing or rhonchi  ABDOMEN: Soft, non-tender, non-distended MUSCULOSKELETAL:  No edema; No deformity  SKIN: Warm and dry NEUROLOGIC:  Alert and oriented x 3 PSYCHIATRIC:  Normal affect   ASSESSMENT:    1. SVT (supraventricular tachycardia) ([place])   2. Other forms of angina pectoris ([place])   3. Essential hypertension   4. Hypercholesterolemia    PLAN:    In order of problems listed above:  1. SVT: It is very difficult to make a recommendation for treatment without seeing the actual tracings.  I think I should collect the data from [place], to include the most recent ECG tracings as well as the previous echo and nuclear stress test and referred to one of our EP specialists.  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  She reportedly developed symptomatic junctional rhythm during treatment with calcium channel blockers in the past. 2. Chest pain: Discussed the fact that some  patients can develop chest pain with tachycardia even with normal coronary arteries, but sometimes the tachycardia simply unmasked underlying CAD.  If she had a truly normal coronary angiogram (years ago) and a negative nuclear stress test in 2017, we may not need to pursue this any further. 3. HTN: Well-controlled. 4. HLP: No recent lipids available for review.  Reportedly intolerant to both statins and Zetia due to myalgia.    Medication Adjustments/Labs and Tests Ordered: Current medicines are reviewed at length with the patient today.  Concerns regarding medicines are outlined above.  Orders Placed This Encounter  Procedures  . Ambulatory referral to Cardiac Electrophysiology  . EKG 12-Lead   No orders of the defined types were placed in this encounter.   Patient Instructions  Medication Instructions:  Your physician recommends that you continue on your current medications as directed. Please refer to the Current Medication list given to you today.  If you need a refill on your cardiac medications before your next appointment, please call your pharmacy.   Lab work: None ordered If you have labs (blood work) drawn today and your tests are completely normal, you will receive your results only by: Marland Kitchen MyChart Message (if you have MyChart) OR . A paper copy in the mail If you have any lab test that is abnormal or we need to change your treatment, we will call you to review the results.  Testing/Procedures: None ordered  Follow-Up: At [facility], you and your health needs are our priority.  As part of our continuing mission to provide you with exceptional heart care, we have created designated Provider Care Teams.  These Care Teams include your primary Cardiologist (physician) and Advanced Practice Providers (APPs -  Physician Assistants and Nurse Practitioners) who all work together to provide you with the care you need, when you need it. You will need a follow up appointment in   as needed with Dr.Norman Piacentini.    You have been referred to EP for a consult with Dr.Allred or Dr.Taylor. Their scheduler will call you directly to schedule the appointment  Any Other Special Instructions Will Be Listed Below (If Applicable). We will request your cardiac testing records from [employer], [employer],  MD  10/03/2018 3:28 PM    [facility]

## 2018-10-03 ENCOUNTER — Encounter: Payer: Self-pay | Admitting: Cardiovascular Disease

## 2018-10-05 ENCOUNTER — Telehealth: Payer: Self-pay

## 2018-10-05 DIAGNOSIS — I471 Supraventricular tachycardia: Secondary | ICD-10-CM

## 2018-10-05 DIAGNOSIS — I208 Other forms of angina pectoris: Secondary | ICD-10-CM

## 2018-10-05 NOTE — Telephone Encounter (Signed)
Spoke with pt. Adv pt that I have made several attempts to get copies of her echo and nuclear study from [facility] prior Cardiologist Dr.Khalil's office. Confirmed pt was seen @ [facility] ED in Nov 2019. Pt sts that she did provide copies of the EKGs performed in the ED to Dr.Khalil's office. Adv pt that I was told that they have no EKGs in their system. Adv pt that Dr.Croitoru would like those records to review. Pt sts that she will also contact Dr.Khalil's office to rqst that the records needed be faxed to our office. Provided our fax# and asked that the records be faxed to Dr.Croitoru's attn. Pt verbalized understanding.

## 2018-10-05 NOTE — Telephone Encounter (Signed)
-----   Message from Sanda Klein, MD sent at 10/03/2018  3:25 PM EST ----- This is the [place] patient we were referring to EP to discuss ablation.  I think that the echo and nuclear test that she had done were from Nov-Dec 2017. Need to get those reports and the ED records from Nov 2020, especially all the ECG tracings, before she goes to EP. Thank you! MCr

## 2018-10-05 NOTE — Telephone Encounter (Signed)
Please advise 

## 2018-10-05 NOTE — Telephone Encounter (Signed)
Follow  Up    Pt is under the impression that you will fax over records with an reason. Please follow up

## 2018-10-05 NOTE — Telephone Encounter (Signed)
Spoke with pt. Pt sts that she has contacted [facility] medical records. They have the records that Dr.Croitoru is requesting in hand. They will need the rqst in writing.  Call and spoke with Sharyn Lull in medical records at [facility]. Ph [phone] She confirmed that the rqst needs to be in writing, it does not require the patients signature. rqst faxed to [phone].

## 2018-10-05 NOTE — Telephone Encounter (Signed)
F/U message       Patient is calling back to check status on her medical. Pls call and advise

## 2018-10-05 NOTE — Telephone Encounter (Signed)
Called patient, she states that she wanted to make sure the information would be given to Dr.Croitoru. I advised that Lattie Haw had called and received the information she needed, if they needed anything else they would be in contact. Patient asked if she was scheduled yet, advised with patient that she was not as of this time, and they would call her before scheduling.  Patient verbalized understanding.

## 2018-10-05 NOTE — Telephone Encounter (Signed)
Please advise. Thanks.  

## 2018-10-05 NOTE — Telephone Encounter (Signed)
lmtcb Pt was seen by Dr.Croitoru on 1/22. He is needing copies of the records below. I have call her previous Cardiologist (retired) office Dr.Khalil  [phone] to rqst copies of the nuc and echo, we still have not received them. Also pt sts that she was seen in the ED in Dec 2020,  they do not see any ED records for [facility], Dr.C would like the ED note and EkG tracings. Called pt to get more info.

## 2018-10-06 NOTE — Telephone Encounter (Signed)
Pt records received and placed in Dr.Croitoru's mailbox for him to review.

## 2018-10-07 NOTE — Telephone Encounter (Signed)
Dr croitoru reviewed records. Spoke with pt, she will need an echo and lexiscan myoview prior to the EP appointment 10-13-18. Patient did not want to schedule any testing right now. She would like to wait and see dr taylor first and let him decide if the testing is needed or not. Patient made aware dr c said she needed them done. Patient still refused to schedule. Testing orders entered. Will for to dr taylor to make him aware.

## 2018-10-07 NOTE — Addendum Note (Signed)
Addended by: Cristopher Estimable on: 10/07/2018 02:17 PM   Modules accepted: Orders

## 2018-10-09 DIAGNOSIS — R0602 Shortness of breath: Secondary | ICD-10-CM | POA: Diagnosis not present

## 2018-10-09 DIAGNOSIS — R079 Chest pain, unspecified: Secondary | ICD-10-CM | POA: Diagnosis not present

## 2018-10-09 DIAGNOSIS — Z79899 Other long term (current) drug therapy: Secondary | ICD-10-CM | POA: Diagnosis not present

## 2018-10-09 DIAGNOSIS — I471 Supraventricular tachycardia: Secondary | ICD-10-CM | POA: Diagnosis not present

## 2018-10-09 DIAGNOSIS — L821 Other seborrheic keratosis: Secondary | ICD-10-CM | POA: Diagnosis not present

## 2018-10-09 DIAGNOSIS — L570 Actinic keratosis: Secondary | ICD-10-CM | POA: Diagnosis not present

## 2018-10-09 DIAGNOSIS — J45909 Unspecified asthma, uncomplicated: Secondary | ICD-10-CM | POA: Diagnosis not present

## 2018-10-09 DIAGNOSIS — I10 Essential (primary) hypertension: Secondary | ICD-10-CM | POA: Diagnosis not present

## 2018-10-12 ENCOUNTER — Encounter: Payer: Self-pay | Admitting: Internal Medicine

## 2018-10-13 ENCOUNTER — Encounter: Payer: Self-pay | Admitting: *Deleted

## 2018-10-13 ENCOUNTER — Encounter: Payer: Self-pay | Admitting: Internal Medicine

## 2018-10-13 ENCOUNTER — Ambulatory Visit: Payer: Medicare HMO | Admitting: Internal Medicine

## 2018-10-13 VITALS — BP 122/80 | HR 66 | Ht 61.0 in | Wt 132.0 lb

## 2018-10-13 DIAGNOSIS — I471 Supraventricular tachycardia: Secondary | ICD-10-CM

## 2018-10-13 NOTE — Progress Notes (Signed)
HPI Angela Phillips is referred by Dr. Sallyanne Kuster for evaluation of SVT. She is a pleasant 73 yo woman with asthma. She has had worsening palpitations over the past 5 years with multiple episodes of SVT requiring visits to the ED with documented HR's of up to 200/min. These are usually terminated with IV adenosine. The patient has been treated with beta blockers but still had breakthrough symptoms. She has chest pressure and sob and near syncope when she goes into SVT.   Allergies  Allergen Reactions  . Aspirin     Other reaction(s): Bleeding (intolerance)  . Buspirone     Other reaction(s): Other (See Comments) Patient does not recall taking this med  . Celecoxib     Other reaction(s): Other (See Comments) Patient cannot remember reaction  . Chlorpheniramine     Other reaction(s): Other (See Comments) Patient cannot remember reaction  . Citalopram     Other reaction(s): Other (See Comments) Patient cannot remember reaction  . Ezetimibe     Other reaction(s): Myalgias (intolerance)  . Hydrocodone-Acetaminophen Itching  . Prochlorperazine     Other reaction(s): Other (See Comments) "makes me feel like I'm having a face stroke"  . Statins     Other reaction(s): Myalgias (intolerance)  . Venlafaxine     Other reaction(s): Other (See Comments) Patient cannot remember reaction     Current Outpatient Medications  Medication Sig Dispense Refill  . Biotin (SUPER BIOTIN) 5 MG TABS Take by mouth.    Marland Kitchen FLUoxetine (PROZAC) 20 MG capsule Take 20 mg by mouth 2 (two) times daily.    Marland Kitchen levalbuterol (XOPENEX) 1.25 MG/3ML nebulizer solution Take 1 ampule by nebulization every 4 (four) hours as needed for Wheezing.    Marland Kitchen losartan-hydrochlorothiazide (HYZAAR) 50-12.5 MG tablet Take 1 tablet by mouth daily.    . montelukast (SINGULAIR) 10 MG tablet Take 10 mg by mouth.    . Multiple Vitamin (MULTI-VITAMINS) TABS Take by mouth.    . nabumetone (RELAFEN) 750 MG tablet Take 750 mg by mouth 2  (two) times daily.    Marland Kitchen omeprazole (PRILOSEC) 20 MG capsule Take 20 mg by mouth.    . pantoprazole (PROTONIX) 40 MG tablet Take 40 mg by mouth.    . simvastatin (ZOCOR) 20 MG tablet Take 20 mg by mouth daily.    Marland Kitchen trimethoprim (TRIMPEX) 100 MG tablet Take 100 mg by mouth daily.    . diphenoxylate-atropine (LOMOTIL) 2.5-0.025 MG tablet Take 1 tablet by mouth every 6 (six) hours as needed for diarrhea or loose stools.    . docusate sodium (COLACE) 100 MG capsule Take 100 mg by mouth daily as needed.      No current facility-administered medications for this visit.      Past Medical History:  Diagnosis Date  . Asthma   . Dyslipidemia (high LDL; low HDL) 08/13/2016  . HTN (hypertension) 11/20/2016  . Paroxysmal SVT (supraventricular tachycardia) ([place]) 07/05/2016    ROS:   All systems reviewed and negative except as noted in the HPI.   Past Surgical History:  Procedure Laterality Date  . ABDOMINAL HYSTERECTOMY    . CARDIAC CATHETERIZATION    . CARPAL TUNNEL RELEASE    . CATARACT EXTRACTION    . CHOLECYSTECTOMY    . TOE SURGERY       Family History  Problem Relation Age of Onset  . Thyroid disease Brother   . Dementia Father   . Heart disease Mother   . Diabetes Mother   .  Thyroid disease Sister      Social History   Socioeconomic History  . Marital status: Married    Spouse name: Not on file  . Number of children: Not on file  . Years of education: Not on file  . Highest education level: Not on file  Occupational History  . Not on file  Social Needs  . Financial resource strain: Not on file  . Food insecurity:    Worry: Not on file    Inability: Not on file  . Transportation needs:    Medical: Not on file    Non-medical: Not on file  Tobacco Use  . Smoking status: Former Research scientist (life sciences)  . Smokeless tobacco: Never Used  Substance and Sexual Activity  . Alcohol use: No    Frequency: Never  . Drug use: No  . Sexual activity: Not on file  Lifestyle  . Physical  activity:    Days per week: Not on file    Minutes per session: Not on file  . Stress: Not on file  Relationships  . Social connections:    Talks on phone: Not on file    Gets together: Not on file    Attends religious service: Not on file    Active member of club or organization: Not on file    Attends meetings of clubs or organizations: Not on file    Relationship status: Not on file  . Intimate partner violence:    Fear of current or ex partner: Not on file    Emotionally abused: Not on file    Physically abused: Not on file    Forced sexual activity: Not on file  Other Topics Concern  . Not on file  Social History Narrative  . Not on file     BP 122/80   Pulse 66   Ht 5\' 1"  (1.549 m)   Wt 132 lb (59.9 kg)   SpO2 99%   BMI 24.94 kg/m   Physical Exam:  Well appearing NAD HEENT: Unremarkable Neck:  No JVD, no thyromegally Lymphatics:  No adenopathy Back:  No CVA tenderness Lungs:  Clear HEART:  Regular rate rhythm, no murmurs, no rubs, no clicks Abd:  soft, positive bowel sounds, no organomegally, no rebound, no guarding Ext:  2 plus pulses, no edema, no cyanosis, no clubbing Skin:  No rashes no nodules Neuro:  CN II through XII intact, motor grossly intact  EKG - nsr with no pre-excitation   Assess/Plan: 1. SVT - I have reviewed her strips and old records. She has medically refractory SVT. I have discussed the indications/risks/benefits/goals/expectations of EPS/RFA of SVT and she wishes to proceed. This will be scheduled in the next few weeks.  Mikle Bosworth.D.

## 2018-10-13 NOTE — Patient Instructions (Addendum)
Medication Instructions:  Your physician recommends that you continue on your current medications as directed. Please refer to the Current Medication list given to you today.  Labwork: None ordered.  Testing/Procedures: Your physician has recommended that you have an ablation. Catheter ablation is a medical procedure used to treat some cardiac arrhythmias (irregular heartbeats). During catheter ablation, a long, thin, flexible tube is put into a blood vessel in your groin (upper thigh), or neck. This tube is called an ablation catheter. It is then guided to your heart through the blood vessel. Radio frequency waves destroy small areas of heart tissue where abnormal heartbeats may cause an arrhythmia to start. Please see the instruction sheet given to you today.  Follow-Up:  You will follow up with Dr. Lovena Le 4 weeks after your procedure.    SVT ablation:  Please arrive to ADMITTING down the hall from the [facility] main entrance of [place] [facility] at:  5:30 am on October 19, 2018  Do not eat or drink after midnight prior to procedure  Do NOT take your METOPROLOL for 2 days prior to your procedure.  Your last dose will be October 16, 2018 your evening dose.  On the AM of your procedure you may take your other morning medications EXCEPT for [place] for one night stay but you may be discharged the day of your procedure  You will need someone to drive you home at discharge  If you need a refill on your cardiac medications before your next appointment, please call your pharmacy.    Cardiac Ablation Cardiac ablation is a procedure to disable (ablate) a small amount of heart tissue in very specific places. The heart has many electrical connections. Sometimes these connections are abnormal and can cause the heart to beat very fast or irregularly. Ablating some of the problem areas can improve the heart rhythm or return it to normal. Ablation may be done for people who:  Have  Wolff-Parkinson-White syndrome.  Have fast heart rhythms (tachycardia).  Have taken medicines for an abnormal heart rhythm (arrhythmia) that were not effective or caused side effects.  Have a high-risk heartbeat that may be life-threatening. During the procedure, a small incision is made in the neck or the groin, and a long, thin, flexible tube (catheter) is inserted into the incision and moved to the heart. Small devices (electrodes) on the tip of the catheter will send out electrical currents. A type of X-ray (fluoroscopy) will be used to help guide the catheter and to provide images of the heart. Tell a health care provider about:  Any allergies you have.  All medicines you are taking, including vitamins, herbs, eye drops, creams, and over-the-counter medicines.  Any problems you or family members have had with anesthetic medicines.  Any blood disorders you have.  Any surgeries you have had.  Any medical conditions you have, such as kidney failure.  Whether you are pregnant or may be pregnant. What are the risks? Generally, this is a safe procedure. However, problems may occur, including:  Infection.  Bruising and bleeding at the catheter insertion site.  Bleeding into the chest, especially into the sac that surrounds the heart. This is a serious complication.  Stroke or blood clots.  Damage to other structures or organs.  Allergic reaction to medicines or dyes.  Need for a permanent pacemaker if the normal electrical system is damaged. A pacemaker is a small computer that sends electrical signals to the heart and helps your heart beat normally.  The procedure not being fully effective. This may not be recognized until months later. Repeat ablation procedures are sometimes required. What happens before the procedure?  Follow instructions from your health care provider about eating or drinking restrictions.  Ask your health care provider about: ? Changing or stopping  your regular medicines. This is especially important if you are taking diabetes medicines or blood thinners. ? Taking medicines such as aspirin and ibuprofen. These medicines can thin your blood. Do not take these medicines before your procedure if your health care provider instructs you not to.  Plan to have someone take you home from the hospital or clinic.  If you will be going home right after the procedure, plan to have someone with you for 24 hours. What happens during the procedure?  To lower your risk of infection: ? Your health care team will wash or sanitize their hands. ? Your skin will be washed with soap. ? Hair may be removed from the incision area.  An IV tube will be inserted into one of your veins.  You will be given a medicine to help you relax (sedative).  The skin on your neck or groin will be numbed.  An incision will be made in your neck or your groin.  A needle will be inserted through the incision and into a large vein in your neck or groin.  A catheter will be inserted into the needle and moved to your heart.  Dye may be injected through the catheter to help your surgeon see the area of the heart that needs treatment.  Electrical currents will be sent from the catheter to ablate heart tissue in desired areas. There are three types of energy that may be used to ablate heart tissue: ? Heat (radiofrequency energy). ? Laser energy. ? Extreme cold (cryoablation).  When the necessary tissue has been ablated, the catheter will be removed.  Pressure will be held on the catheter insertion area to prevent excessive bleeding.  A bandage (dressing) will be placed over the catheter insertion area. The procedure may vary among health care providers and hospitals. What happens after the procedure?  Your blood pressure, heart rate, breathing rate, and blood oxygen level will be monitored until the medicines you were given have worn off.  Your catheter insertion area  will be monitored for bleeding. You will need to lie still for a few hours to ensure that you do not bleed from the catheter insertion area.  Do not drive for 24 hours or as long as directed by your health care provider. Summary  Cardiac ablation is a procedure to disable (ablate) a small amount of heart tissue in very specific places. Ablating some of the problem areas can improve the heart rhythm or return it to normal.  During the procedure, electrical currents will be sent from the catheter to ablate heart tissue in desired areas. This information is not intended to replace advice given to you by your health care provider. Make sure you discuss any questions you have with your health care provider. Document Released: 01/12/2009 Document Revised: 07/15/2016 Document Reviewed: 07/15/2016 Elsevier Interactive Patient Education  2019 [employer].

## 2018-10-13 NOTE — H&P (View-Only) (Signed)
HPI Angela Phillips is referred by Dr. Sallyanne Kuster for evaluation of SVT. She is a pleasant 73 yo woman with asthma. She has had worsening palpitations over the past 5 years with multiple episodes of SVT requiring visits to the ED with documented HR's of up to 200/min. These are usually terminated with IV adenosine. The patient has been treated with beta blockers but still had breakthrough symptoms. She has chest pressure and sob and near syncope when she goes into SVT.   Allergies  Allergen Reactions  . Aspirin     Other reaction(s): Bleeding (intolerance)  . Buspirone     Other reaction(s): Other (See Comments) Patient does not recall taking this med  . Celecoxib     Other reaction(s): Other (See Comments) Patient cannot remember reaction  . Chlorpheniramine     Other reaction(s): Other (See Comments) Patient cannot remember reaction  . Citalopram     Other reaction(s): Other (See Comments) Patient cannot remember reaction  . Ezetimibe     Other reaction(s): Myalgias (intolerance)  . Hydrocodone-Acetaminophen Itching  . Prochlorperazine     Other reaction(s): Other (See Comments) "makes me feel like I'm having a face stroke"  . Statins     Other reaction(s): Myalgias (intolerance)  . Venlafaxine     Other reaction(s): Other (See Comments) Patient cannot remember reaction     Current Outpatient Medications  Medication Sig Dispense Refill  . Biotin (SUPER BIOTIN) 5 MG TABS Take by mouth.    Marland Kitchen FLUoxetine (PROZAC) 20 MG capsule Take 20 mg by mouth 2 (two) times daily.    Marland Kitchen levalbuterol (XOPENEX) 1.25 MG/3ML nebulizer solution Take 1 ampule by nebulization every 4 (four) hours as needed for Wheezing.    Marland Kitchen losartan-hydrochlorothiazide (HYZAAR) 50-12.5 MG tablet Take 1 tablet by mouth daily.    . montelukast (SINGULAIR) 10 MG tablet Take 10 mg by mouth.    . Multiple Vitamin (MULTI-VITAMINS) TABS Take by mouth.    . nabumetone (RELAFEN) 750 MG tablet Take 750 mg by mouth 2  (two) times daily.    Marland Kitchen omeprazole (PRILOSEC) 20 MG capsule Take 20 mg by mouth.    . pantoprazole (PROTONIX) 40 MG tablet Take 40 mg by mouth.    . simvastatin (ZOCOR) 20 MG tablet Take 20 mg by mouth daily.    Marland Kitchen trimethoprim (TRIMPEX) 100 MG tablet Take 100 mg by mouth daily.    . diphenoxylate-atropine (LOMOTIL) 2.5-0.025 MG tablet Take 1 tablet by mouth every 6 (six) hours as needed for diarrhea or loose stools.    . docusate sodium (COLACE) 100 MG capsule Take 100 mg by mouth daily as needed.      No current facility-administered medications for this visit.      Past Medical History:  Diagnosis Date  . Asthma   . Dyslipidemia (high LDL; low HDL) 08/13/2016  . HTN (hypertension) 11/20/2016  . Paroxysmal SVT (supraventricular tachycardia) ([place]) 07/05/2016    ROS:   All systems reviewed and negative except as noted in the HPI.   Past Surgical History:  Procedure Laterality Date  . ABDOMINAL HYSTERECTOMY    . CARDIAC CATHETERIZATION    . CARPAL TUNNEL RELEASE    . CATARACT EXTRACTION    . CHOLECYSTECTOMY    . TOE SURGERY       Family History  Problem Relation Age of Onset  . Thyroid disease Brother   . Dementia Father   . Heart disease Mother   . Diabetes Mother   .  Thyroid disease Sister      Social History   Socioeconomic History  . Marital status: Married    Spouse name: Not on file  . Number of children: Not on file  . Years of education: Not on file  . Highest education level: Not on file  Occupational History  . Not on file  Social Needs  . Financial resource strain: Not on file  . Food insecurity:    Worry: Not on file    Inability: Not on file  . Transportation needs:    Medical: Not on file    Non-medical: Not on file  Tobacco Use  . Smoking status: Former Research scientist (life sciences)  . Smokeless tobacco: Never Used  Substance and Sexual Activity  . Alcohol use: No    Frequency: Never  . Drug use: No  . Sexual activity: Not on file  Lifestyle  . Physical  activity:    Days per week: Not on file    Minutes per session: Not on file  . Stress: Not on file  Relationships  . Social connections:    Talks on phone: Not on file    Gets together: Not on file    Attends religious service: Not on file    Active member of club or organization: Not on file    Attends meetings of clubs or organizations: Not on file    Relationship status: Not on file  . Intimate partner violence:    Fear of current or ex partner: Not on file    Emotionally abused: Not on file    Physically abused: Not on file    Forced sexual activity: Not on file  Other Topics Concern  . Not on file  Social History Narrative  . Not on file     BP 122/80   Pulse 66   Ht 5\' 1"  (1.549 m)   Wt 132 lb (59.9 kg)   SpO2 99%   BMI 24.94 kg/m   Physical Exam:  Well appearing NAD HEENT: Unremarkable Neck:  No JVD, no thyromegally Lymphatics:  No adenopathy Back:  No CVA tenderness Lungs:  Clear HEART:  Regular rate rhythm, no murmurs, no rubs, no clicks Abd:  soft, positive bowel sounds, no organomegally, no rebound, no guarding Ext:  2 plus pulses, no edema, no cyanosis, no clubbing Skin:  No rashes no nodules Neuro:  CN II through XII intact, motor grossly intact  EKG - nsr with no pre-excitation   Assess/Plan: 1. SVT - I have reviewed her strips and old records. She has medically refractory SVT. I have discussed the indications/risks/benefits/goals/expectations of EPS/RFA of SVT and she wishes to proceed. This will be scheduled in the next few weeks.  Mikle Bosworth.D.

## 2018-10-14 DIAGNOSIS — J45909 Unspecified asthma, uncomplicated: Secondary | ICD-10-CM | POA: Diagnosis not present

## 2018-10-19 ENCOUNTER — Ambulatory Visit (HOSPITAL_COMMUNITY)
Admission: RE | Admit: 2018-10-19 | Discharge: 2018-10-19 | Disposition: A | Discharge status: Home / Self Care | Payer: Medicare HMO | Attending: Internal Medicine | Admitting: Internal Medicine

## 2018-10-19 ENCOUNTER — Encounter (HOSPITAL_COMMUNITY)
Admission: RE | Disposition: A | Discharge status: Home / Self Care | Payer: Self-pay | Source: Home / Self Care | Attending: Internal Medicine

## 2018-10-19 ENCOUNTER — Other Ambulatory Visit: Payer: Self-pay

## 2018-10-19 DIAGNOSIS — J45909 Unspecified asthma, uncomplicated: Secondary | ICD-10-CM | POA: Diagnosis not present

## 2018-10-19 DIAGNOSIS — I471 Supraventricular tachycardia, unspecified: Secondary | ICD-10-CM

## 2018-10-19 DIAGNOSIS — Z8249 Family history of ischemic heart disease and other diseases of the circulatory system: Secondary | ICD-10-CM | POA: Insufficient documentation

## 2018-10-19 DIAGNOSIS — Z87891 Personal history of nicotine dependence: Secondary | ICD-10-CM | POA: Diagnosis not present

## 2018-10-19 DIAGNOSIS — Z79899 Other long term (current) drug therapy: Secondary | ICD-10-CM | POA: Insufficient documentation

## 2018-10-19 DIAGNOSIS — Z888 Allergy status to other drugs, medicaments and biological substances status: Secondary | ICD-10-CM | POA: Diagnosis not present

## 2018-10-19 DIAGNOSIS — I10 Essential (primary) hypertension: Secondary | ICD-10-CM | POA: Diagnosis not present

## 2018-10-19 DIAGNOSIS — Z886 Allergy status to analgesic agent status: Secondary | ICD-10-CM | POA: Insufficient documentation

## 2018-10-19 DIAGNOSIS — E785 Hyperlipidemia, unspecified: Secondary | ICD-10-CM | POA: Diagnosis not present

## 2018-10-19 DIAGNOSIS — Z90710 Acquired absence of both cervix and uterus: Secondary | ICD-10-CM | POA: Diagnosis not present

## 2018-10-19 DIAGNOSIS — Z885 Allergy status to narcotic agent status: Secondary | ICD-10-CM | POA: Diagnosis not present

## 2018-10-19 HISTORY — PX: SVT ABLATION: EP1225

## 2018-10-19 HISTORY — DX: Supraventricular tachycardia, unspecified: I47.10

## 2018-10-19 LAB — POTASSIUM: Potassium: 3.8 mmol/L (ref 3.5–5.1)

## 2018-10-19 SURGERY — SVT ABLATION

## 2018-10-19 MED ORDER — SODIUM CHLORIDE 0.9 % IV SOLN
INTRAVENOUS | Status: DC
  Administered 2018-10-19: 06:00:00 via INTRAVENOUS

## 2018-10-19 MED ORDER — SODIUM CHLORIDE 0.9 % IV SOLN
INTRAVENOUS | Status: DC | PRN
  Administered 2018-10-19: 4 ug/min via INTRAVENOUS

## 2018-10-19 MED ORDER — MIDAZOLAM HCL 5 MG/5ML IJ SOLN
INTRAMUSCULAR | Status: DC | PRN
  Administered 2018-10-19 (×11): 1 mg via INTRAVENOUS

## 2018-10-19 MED ORDER — MIDAZOLAM HCL 5 MG/5ML IJ SOLN
INTRAMUSCULAR | Status: AC
  Filled 2018-10-19: qty 5

## 2018-10-19 MED ORDER — ISOPROTERENOL HCL 0.2 MG/ML IJ SOLN
INTRAMUSCULAR | Status: AC
  Filled 2018-10-19: qty 5

## 2018-10-19 MED ORDER — FENTANYL CITRATE (PF) 100 MCG/2ML IJ SOLN
25.0000 ug | Freq: Once | INTRAMUSCULAR | Status: AC
  Administered 2018-10-19: 25 ug via INTRAVENOUS

## 2018-10-19 MED ORDER — FENTANYL CITRATE (PF) 100 MCG/2ML IJ SOLN
INTRAMUSCULAR | Status: AC
  Filled 2018-10-19: qty 2

## 2018-10-19 MED ORDER — FENTANYL CITRATE (PF) 100 MCG/2ML IJ SOLN
INTRAMUSCULAR | Status: DC | PRN
  Administered 2018-10-19 (×4): 12.5 ug via INTRAVENOUS
  Administered 2018-10-19: 25 ug via INTRAVENOUS
  Administered 2018-10-19 (×5): 12.5 ug via INTRAVENOUS

## 2018-10-19 MED ORDER — HEPARIN (PORCINE) IN NACL 1000-0.9 UT/500ML-% IV SOLN
INTRAVENOUS | Status: DC | PRN
  Administered 2018-10-19: 500 mL

## 2018-10-19 MED ORDER — BUPIVACAINE HCL (PF) 0.25 % IJ SOLN
INTRAMUSCULAR | Status: DC | PRN
  Administered 2018-10-19: 45 mL

## 2018-10-19 SURGICAL SUPPLY — 11 items
BAG SNAP BAND KOVER 36X36 (MISCELLANEOUS) ×1 IMPLANT
CATH CELSIUS THERM D CV 7F (ABLATOR) ×1 IMPLANT
CATH JOSEPH QUAD ALLRED 6F REP (CATHETERS) ×2 IMPLANT
CATH POLARIS X 2.5/5/2.5 DECAP (CATHETERS) ×1 IMPLANT
PACK EP LATEX FREE (CUSTOM PROCEDURE TRAY) ×2
PACK EP LF (CUSTOM PROCEDURE TRAY) ×1 IMPLANT
PAD PRO RADIOLUCENT 2001M-C (PAD) ×2 IMPLANT
SHEATH PINNACLE 6F 10CM (SHEATH) ×2 IMPLANT
SHEATH PINNACLE 7F 10CM (SHEATH) ×1 IMPLANT
SHEATH PINNACLE 8F 10CM (SHEATH) ×1 IMPLANT
SHIELD RADPAD SCOOP 12X17 (MISCELLANEOUS) ×1 IMPLANT

## 2018-10-19 NOTE — Progress Notes (Signed)
Site area:right IJ  Site Prior to Removal: level 0  Pressure Applied For:  42mins  Minutes Beginning at: 0950  Manual:yes  Patient Status During Pull: without complaints, A/O, resting comfortably  Post Pull Groin Site: level 0  Post Pull Instructions Given:  yes, verbalized and demonstrated understanding  Dressing Applied:  Gauze, tegaderm

## 2018-10-19 NOTE — Progress Notes (Signed)
Site area: rt groin fv sheaths x3 Site Prior to Removal:  Level 0 Pressure Applied For: 15 minutes Manual:   yes Patient Status During Pull:  stable Post Pull Site:  Level 0 Post Pull Instructions Given:  yes Post Pull Pulses Present: rt dp palpable Dressing Applied:  Gauze and tegaderm Bedrest begins @ 1030 Comments: IV saline locked

## 2018-10-19 NOTE — Interval H&P Note (Signed)
History and Physical Interval Note:  10/19/2018 7:12 AM  [name]  has presented today for surgery, with the diagnosis of svt  The various methods of treatment have been discussed with the patient and family. After consideration of risks, benefits and other options for treatment, the patient has consented to  Procedure(s): SVT ABLATION (N/A) as a surgical intervention .  The patient's history has been reviewed, patient examined, no change in status, stable for surgery.  I have reviewed the patient's chart and labs.  Questions were answered to the patient's satisfaction.     Angela Phillips

## 2018-10-19 NOTE — Discharge Instructions (Signed)
Post procedure care instructions No driving for 4 days. No lifting over 5 lbs for 1 week. No vigorous sexual activity for 1 week. You may return to work on 10/26/2018. Keep procedure site clean & dry. If you notice increased pain, swelling, bleeding or pus, call/return!  You may shower, but no soaking baths/hot tubs/pools for 1 week.  Femoral Site Care This sheet gives you information about how to care for yourself after your procedure. Your health care provider may also give you more specific instructions. If you have problems or questions, contact your health care provider. What can I expect after the procedure? After the procedure, it is common to have:  Bruising that usually fades within 1-2 weeks.  Tenderness at the site. Follow these instructions at home: Wound care  Follow instructions from your health care provider about how to take care of your insertion site. Make sure you: ? Wash your hands with soap and water before you change your bandage (dressing). If soap and water are not available, use hand sanitizer. ? Change your dressing as told by your health care provider. ? Leave stitches (sutures), skin glue, or adhesive strips in place. These skin closures may need to stay in place for 2 weeks or longer. If adhesive strip edges start to loosen and curl up, you may trim the loose edges. Do not remove adhesive strips completely unless your health care provider tells you to do that.  Do not take baths, swim, or use a hot tub until your health care provider approves.  You may shower 24-48 hours after the procedure or as told by your health care provider. ? Gently wash the site with plain soap and water. ? Pat the area dry with a clean towel. ? Do not rub the site. This may cause bleeding.  Do not apply powder or lotion to the site. Keep the site clean and dry.  Check your femoral site every day for signs of infection. Check for: ? Redness, swelling, or pain. ? Fluid or  blood. ? Warmth. ? Pus or a bad smell. Activity  For the first 2-3 days after your procedure, or as long as directed: ? Avoid climbing stairs as much as possible. ? Do not squat.  Do not lift anything that is heavier than 10 lb (4.5 kg), or the limit that you are told, until your health care provider says that it is safe.  Rest as directed. ? Avoid sitting for a long time without moving. Get up to take short walks every 1-2 hours.  Do not drive for 24 hours if you were given a medicine to help you relax (sedative). General instructions  Take over-the-counter and prescription medicines only as told by your health care provider.  Keep all follow-up visits as told by your health care provider. This is important. Contact a health care provider if you have:  A fever or chills.  You have redness, swelling, or pain around your insertion site. Get help right away if:  The catheter insertion area swells very fast.  You pass out.  You suddenly start to sweat or your skin gets clammy.  The catheter insertion area is bleeding, and the bleeding does not stop when you hold steady pressure on the area.  The area near or just beyond the catheter insertion site becomes pale, cool, tingly, or numb. These symptoms may represent a serious problem that is an emergency. Do not wait to see if the symptoms will go away. Get medical help right  away. Call your local emergency services (911 in the [place] Do not drive yourself to the hospital. Summary  After the procedure, it is common to have bruising that usually fades within 1-2 weeks.  Check your femoral site every day for signs of infection.  Do not lift anything that is heavier than 10 lb (4.5 kg), or the limit that you are told, until your health care provider says that it is safe. This information is not intended to replace advice given to you by your health care provider. Make sure you discuss any questions you have with your health care  provider. Document Released: 04/29/2014 Document Revised: 09/08/2017 Document Reviewed: 09/08/2017 Elsevier Interactive Patient Education  2019 [employer].

## 2018-10-19 NOTE — Progress Notes (Signed)
No bleeding or swelling noted after ambulation

## 2018-10-20 ENCOUNTER — Encounter (HOSPITAL_COMMUNITY): Payer: Self-pay | Admitting: Internal Medicine

## 2018-11-02 DIAGNOSIS — R110 Nausea: Secondary | ICD-10-CM | POA: Diagnosis not present

## 2018-11-02 DIAGNOSIS — R300 Dysuria: Secondary | ICD-10-CM | POA: Diagnosis not present

## 2018-11-02 DIAGNOSIS — Z6824 Body mass index (BMI) 24.0-24.9, adult: Secondary | ICD-10-CM | POA: Diagnosis not present

## 2018-11-02 DIAGNOSIS — N390 Urinary tract infection, site not specified: Secondary | ICD-10-CM | POA: Diagnosis not present

## 2018-11-12 DIAGNOSIS — J45909 Unspecified asthma, uncomplicated: Secondary | ICD-10-CM | POA: Diagnosis not present

## 2018-11-19 DIAGNOSIS — E119 Type 2 diabetes mellitus without complications: Secondary | ICD-10-CM | POA: Diagnosis not present

## 2018-11-19 DIAGNOSIS — R5381 Other malaise: Secondary | ICD-10-CM | POA: Diagnosis not present

## 2018-11-19 DIAGNOSIS — Z1331 Encounter for screening for depression: Secondary | ICD-10-CM | POA: Diagnosis not present

## 2018-11-19 DIAGNOSIS — Z6824 Body mass index (BMI) 24.0-24.9, adult: Secondary | ICD-10-CM | POA: Diagnosis not present

## 2018-11-19 DIAGNOSIS — Z9181 History of falling: Secondary | ICD-10-CM | POA: Diagnosis not present

## 2018-11-19 DIAGNOSIS — N390 Urinary tract infection, site not specified: Secondary | ICD-10-CM | POA: Diagnosis not present

## 2018-11-19 DIAGNOSIS — E785 Hyperlipidemia, unspecified: Secondary | ICD-10-CM | POA: Diagnosis not present

## 2018-11-19 DIAGNOSIS — R197 Diarrhea, unspecified: Secondary | ICD-10-CM | POA: Diagnosis not present

## 2018-11-19 DIAGNOSIS — E538 Deficiency of other specified B group vitamins: Secondary | ICD-10-CM | POA: Diagnosis not present

## 2018-11-19 DIAGNOSIS — I10 Essential (primary) hypertension: Secondary | ICD-10-CM | POA: Diagnosis not present

## 2018-11-24 ENCOUNTER — Telehealth: Payer: Self-pay

## 2018-11-24 NOTE — Telephone Encounter (Signed)
Call placed to Pt.  Advised at this time-routine follow up appointments are being cancelled to reduce possible exposure for our patients.  Per Pt-feels wonderful.  States she does not need to see Dr. Lovena Le again unless Dr. Lovena Le really wants to see her.    Advised appointment would be cancelled and Pt would be contacted to reschedule.  Advised if Pt continues to feel wonderful when we contact her again we do not need to see her and we can follow her as needed.  Advised to call office if any needs.

## 2018-11-25 ENCOUNTER — Ambulatory Visit: Payer: Medicare HMO | Admitting: Internal Medicine

## 2018-12-10 DIAGNOSIS — R69 Illness, unspecified: Secondary | ICD-10-CM | POA: Diagnosis not present

## 2018-12-10 DIAGNOSIS — J45909 Unspecified asthma, uncomplicated: Secondary | ICD-10-CM | POA: Diagnosis not present

## 2018-12-10 DIAGNOSIS — J309 Allergic rhinitis, unspecified: Secondary | ICD-10-CM | POA: Diagnosis not present

## 2018-12-13 DIAGNOSIS — J45909 Unspecified asthma, uncomplicated: Secondary | ICD-10-CM | POA: Diagnosis not present

## 2019-01-12 DIAGNOSIS — J45909 Unspecified asthma, uncomplicated: Secondary | ICD-10-CM | POA: Diagnosis not present

## 2019-02-05 DIAGNOSIS — J4541 Moderate persistent asthma with (acute) exacerbation: Secondary | ICD-10-CM | POA: Diagnosis not present

## 2019-02-05 DIAGNOSIS — R69 Illness, unspecified: Secondary | ICD-10-CM | POA: Diagnosis not present

## 2019-02-05 DIAGNOSIS — Z6824 Body mass index (BMI) 24.0-24.9, adult: Secondary | ICD-10-CM | POA: Diagnosis not present

## 2019-02-12 DIAGNOSIS — J45909 Unspecified asthma, uncomplicated: Secondary | ICD-10-CM | POA: Diagnosis not present

## 2019-02-22 DIAGNOSIS — E119 Type 2 diabetes mellitus without complications: Secondary | ICD-10-CM | POA: Diagnosis not present

## 2019-02-22 DIAGNOSIS — M2550 Pain in unspecified joint: Secondary | ICD-10-CM | POA: Diagnosis not present

## 2019-02-22 DIAGNOSIS — J4540 Moderate persistent asthma, uncomplicated: Secondary | ICD-10-CM | POA: Diagnosis not present

## 2019-02-22 DIAGNOSIS — I10 Essential (primary) hypertension: Secondary | ICD-10-CM | POA: Diagnosis not present

## 2019-02-22 DIAGNOSIS — R69 Illness, unspecified: Secondary | ICD-10-CM | POA: Diagnosis not present

## 2019-02-22 DIAGNOSIS — E785 Hyperlipidemia, unspecified: Secondary | ICD-10-CM | POA: Diagnosis not present

## 2019-02-22 DIAGNOSIS — J9611 Chronic respiratory failure with hypoxia: Secondary | ICD-10-CM | POA: Diagnosis not present

## 2019-02-22 DIAGNOSIS — R197 Diarrhea, unspecified: Secondary | ICD-10-CM | POA: Diagnosis not present

## 2019-02-22 DIAGNOSIS — I471 Supraventricular tachycardia: Secondary | ICD-10-CM | POA: Diagnosis not present

## 2019-02-22 DIAGNOSIS — Z6824 Body mass index (BMI) 24.0-24.9, adult: Secondary | ICD-10-CM | POA: Diagnosis not present

## 2019-03-14 DIAGNOSIS — J45909 Unspecified asthma, uncomplicated: Secondary | ICD-10-CM | POA: Diagnosis not present

## 2019-04-14 DIAGNOSIS — J45909 Unspecified asthma, uncomplicated: Secondary | ICD-10-CM | POA: Diagnosis not present

## 2019-04-22 DIAGNOSIS — J180 Bronchopneumonia, unspecified organism: Secondary | ICD-10-CM | POA: Diagnosis not present

## 2019-04-22 DIAGNOSIS — Z6824 Body mass index (BMI) 24.0-24.9, adult: Secondary | ICD-10-CM | POA: Diagnosis not present

## 2019-04-22 DIAGNOSIS — J4541 Moderate persistent asthma with (acute) exacerbation: Secondary | ICD-10-CM | POA: Diagnosis not present

## 2019-04-23 DIAGNOSIS — R69 Illness, unspecified: Secondary | ICD-10-CM | POA: Diagnosis not present

## 2019-04-30 DIAGNOSIS — R69 Illness, unspecified: Secondary | ICD-10-CM | POA: Diagnosis not present

## 2019-05-13 DIAGNOSIS — Z961 Presence of intraocular lens: Secondary | ICD-10-CM | POA: Diagnosis not present

## 2019-05-13 DIAGNOSIS — H04123 Dry eye syndrome of bilateral lacrimal glands: Secondary | ICD-10-CM | POA: Diagnosis not present

## 2019-05-13 DIAGNOSIS — E119 Type 2 diabetes mellitus without complications: Secondary | ICD-10-CM | POA: Diagnosis not present

## 2019-05-15 DIAGNOSIS — J45909 Unspecified asthma, uncomplicated: Secondary | ICD-10-CM | POA: Diagnosis not present

## 2019-05-31 DIAGNOSIS — R300 Dysuria: Secondary | ICD-10-CM | POA: Diagnosis not present

## 2019-05-31 DIAGNOSIS — Z6823 Body mass index (BMI) 23.0-23.9, adult: Secondary | ICD-10-CM | POA: Diagnosis not present

## 2019-06-14 DIAGNOSIS — R69 Illness, unspecified: Secondary | ICD-10-CM | POA: Diagnosis not present

## 2019-06-14 DIAGNOSIS — J45909 Unspecified asthma, uncomplicated: Secondary | ICD-10-CM | POA: Diagnosis not present

## 2019-07-13 DIAGNOSIS — R310 Gross hematuria: Secondary | ICD-10-CM | POA: Diagnosis not present

## 2019-07-13 DIAGNOSIS — N390 Urinary tract infection, site not specified: Secondary | ICD-10-CM | POA: Diagnosis not present

## 2019-07-15 DIAGNOSIS — J45909 Unspecified asthma, uncomplicated: Secondary | ICD-10-CM | POA: Diagnosis not present

## 2019-07-16 ENCOUNTER — Other Ambulatory Visit: Payer: Self-pay | Admitting: Urology

## 2019-07-16 ENCOUNTER — Other Ambulatory Visit (HOSPITAL_COMMUNITY): Payer: Self-pay | Admitting: Urology

## 2019-07-16 DIAGNOSIS — R310 Gross hematuria: Secondary | ICD-10-CM

## 2019-07-19 DIAGNOSIS — N200 Calculus of kidney: Secondary | ICD-10-CM | POA: Diagnosis not present

## 2019-07-19 DIAGNOSIS — R109 Unspecified abdominal pain: Secondary | ICD-10-CM | POA: Diagnosis not present

## 2019-07-21 ENCOUNTER — Ambulatory Visit (HOSPITAL_COMMUNITY)
Admission: RE | Admit: 2019-07-21 | Discharge: 2019-07-21 | Disposition: A | Discharge status: Home / Self Care | Payer: Medicare HMO | Source: Ambulatory Visit | Attending: Urology | Admitting: Urology

## 2019-07-21 ENCOUNTER — Other Ambulatory Visit: Payer: Self-pay

## 2019-07-21 ENCOUNTER — Encounter (HOSPITAL_COMMUNITY): Payer: Self-pay

## 2019-07-21 DIAGNOSIS — R310 Gross hematuria: Secondary | ICD-10-CM | POA: Diagnosis not present

## 2019-07-21 DIAGNOSIS — K5730 Diverticulosis of large intestine without perforation or abscess without bleeding: Secondary | ICD-10-CM | POA: Diagnosis not present

## 2019-07-21 LAB — POCT I-STAT CREATININE: Creatinine, Ser: 0.8 mg/dL (ref 0.44–1.00)

## 2019-07-21 MED ORDER — IOHEXOL 300 MG/ML  SOLN
100.0000 mL | Freq: Once | INTRAMUSCULAR | Status: AC | PRN
  Administered 2019-07-21: 100 mL via INTRAVENOUS

## 2019-07-21 MED ORDER — SODIUM CHLORIDE (PF) 0.9 % IJ SOLN
INTRAMUSCULAR | Status: AC
  Filled 2019-07-21: qty 50

## 2019-07-29 DIAGNOSIS — N3020 Other chronic cystitis without hematuria: Secondary | ICD-10-CM | POA: Diagnosis not present

## 2019-07-29 DIAGNOSIS — R310 Gross hematuria: Secondary | ICD-10-CM | POA: Diagnosis not present

## 2019-07-31 DIAGNOSIS — R69 Illness, unspecified: Secondary | ICD-10-CM | POA: Diagnosis not present

## 2019-08-12 DIAGNOSIS — R062 Wheezing: Secondary | ICD-10-CM | POA: Diagnosis not present

## 2019-08-12 DIAGNOSIS — R05 Cough: Secondary | ICD-10-CM | POA: Diagnosis not present

## 2019-08-12 DIAGNOSIS — J40 Bronchitis, not specified as acute or chronic: Secondary | ICD-10-CM | POA: Diagnosis not present

## 2019-08-12 DIAGNOSIS — Z03818 Encounter for observation for suspected exposure to other biological agents ruled out: Secondary | ICD-10-CM | POA: Diagnosis not present

## 2019-08-12 DIAGNOSIS — R0602 Shortness of breath: Secondary | ICD-10-CM | POA: Diagnosis not present

## 2019-08-12 DIAGNOSIS — J209 Acute bronchitis, unspecified: Secondary | ICD-10-CM | POA: Diagnosis not present

## 2019-08-14 DIAGNOSIS — J45909 Unspecified asthma, uncomplicated: Secondary | ICD-10-CM | POA: Diagnosis not present

## 2019-09-14 DIAGNOSIS — J45909 Unspecified asthma, uncomplicated: Secondary | ICD-10-CM | POA: Diagnosis not present

## 2019-09-27 DIAGNOSIS — J45901 Unspecified asthma with (acute) exacerbation: Secondary | ICD-10-CM | POA: Diagnosis not present

## 2019-09-27 DIAGNOSIS — B9689 Other specified bacterial agents as the cause of diseases classified elsewhere: Secondary | ICD-10-CM | POA: Diagnosis not present

## 2019-09-27 DIAGNOSIS — J208 Acute bronchitis due to other specified organisms: Secondary | ICD-10-CM | POA: Diagnosis not present

## 2019-10-15 DIAGNOSIS — J45909 Unspecified asthma, uncomplicated: Secondary | ICD-10-CM | POA: Diagnosis not present

## 2019-10-20 DIAGNOSIS — J189 Pneumonia, unspecified organism: Secondary | ICD-10-CM | POA: Diagnosis not present

## 2019-10-20 DIAGNOSIS — Z20828 Contact with and (suspected) exposure to other viral communicable diseases: Secondary | ICD-10-CM | POA: Diagnosis not present

## 2019-11-12 DIAGNOSIS — J45909 Unspecified asthma, uncomplicated: Secondary | ICD-10-CM | POA: Diagnosis not present

## 2019-11-26 DIAGNOSIS — R1012 Left upper quadrant pain: Secondary | ICD-10-CM | POA: Diagnosis not present

## 2019-11-26 DIAGNOSIS — N390 Urinary tract infection, site not specified: Secondary | ICD-10-CM | POA: Diagnosis not present

## 2019-11-26 DIAGNOSIS — R109 Unspecified abdominal pain: Secondary | ICD-10-CM | POA: Diagnosis not present

## 2019-11-26 DIAGNOSIS — R310 Gross hematuria: Secondary | ICD-10-CM | POA: Diagnosis not present

## 2019-11-26 DIAGNOSIS — R1011 Right upper quadrant pain: Secondary | ICD-10-CM | POA: Diagnosis not present

## 2019-12-09 DIAGNOSIS — H04123 Dry eye syndrome of bilateral lacrimal glands: Secondary | ICD-10-CM | POA: Diagnosis not present

## 2019-12-13 DIAGNOSIS — J45909 Unspecified asthma, uncomplicated: Secondary | ICD-10-CM | POA: Diagnosis not present

## 2019-12-23 DIAGNOSIS — H04123 Dry eye syndrome of bilateral lacrimal glands: Secondary | ICD-10-CM | POA: Diagnosis not present

## 2020-01-11 DIAGNOSIS — J9611 Chronic respiratory failure with hypoxia: Secondary | ICD-10-CM | POA: Diagnosis not present

## 2020-01-11 DIAGNOSIS — E785 Hyperlipidemia, unspecified: Secondary | ICD-10-CM | POA: Diagnosis not present

## 2020-01-11 DIAGNOSIS — R69 Illness, unspecified: Secondary | ICD-10-CM | POA: Diagnosis not present

## 2020-01-11 DIAGNOSIS — Z6823 Body mass index (BMI) 23.0-23.9, adult: Secondary | ICD-10-CM | POA: Diagnosis not present

## 2020-01-11 DIAGNOSIS — E119 Type 2 diabetes mellitus without complications: Secondary | ICD-10-CM | POA: Diagnosis not present

## 2020-01-11 DIAGNOSIS — M79672 Pain in left foot: Secondary | ICD-10-CM | POA: Diagnosis not present

## 2020-01-11 DIAGNOSIS — I471 Supraventricular tachycardia: Secondary | ICD-10-CM | POA: Diagnosis not present

## 2020-01-11 DIAGNOSIS — M79671 Pain in right foot: Secondary | ICD-10-CM | POA: Diagnosis not present

## 2020-01-11 DIAGNOSIS — I10 Essential (primary) hypertension: Secondary | ICD-10-CM | POA: Diagnosis not present

## 2020-01-11 DIAGNOSIS — J45901 Unspecified asthma with (acute) exacerbation: Secondary | ICD-10-CM | POA: Diagnosis not present

## 2020-01-12 DIAGNOSIS — J45909 Unspecified asthma, uncomplicated: Secondary | ICD-10-CM | POA: Diagnosis not present

## 2020-02-12 DIAGNOSIS — J45909 Unspecified asthma, uncomplicated: Secondary | ICD-10-CM | POA: Diagnosis not present

## 2020-02-14 DIAGNOSIS — M5481 Occipital neuralgia: Secondary | ICD-10-CM | POA: Diagnosis not present

## 2020-02-14 DIAGNOSIS — R519 Headache, unspecified: Secondary | ICD-10-CM | POA: Diagnosis not present

## 2020-02-14 DIAGNOSIS — R911 Solitary pulmonary nodule: Secondary | ICD-10-CM | POA: Diagnosis not present

## 2020-02-14 DIAGNOSIS — M47812 Spondylosis without myelopathy or radiculopathy, cervical region: Secondary | ICD-10-CM | POA: Diagnosis not present

## 2020-02-16 DIAGNOSIS — J4540 Moderate persistent asthma, uncomplicated: Secondary | ICD-10-CM | POA: Diagnosis not present

## 2020-02-16 DIAGNOSIS — G4733 Obstructive sleep apnea (adult) (pediatric): Secondary | ICD-10-CM | POA: Diagnosis not present

## 2020-02-16 DIAGNOSIS — R918 Other nonspecific abnormal finding of lung field: Secondary | ICD-10-CM | POA: Diagnosis not present

## 2020-02-16 DIAGNOSIS — R5383 Other fatigue: Secondary | ICD-10-CM | POA: Diagnosis not present

## 2020-02-24 DIAGNOSIS — R918 Other nonspecific abnormal finding of lung field: Secondary | ICD-10-CM | POA: Diagnosis not present

## 2020-02-28 DIAGNOSIS — J4540 Moderate persistent asthma, uncomplicated: Secondary | ICD-10-CM | POA: Diagnosis not present

## 2020-02-28 DIAGNOSIS — R918 Other nonspecific abnormal finding of lung field: Secondary | ICD-10-CM | POA: Diagnosis not present

## 2020-02-28 DIAGNOSIS — R5383 Other fatigue: Secondary | ICD-10-CM | POA: Diagnosis not present

## 2020-02-28 DIAGNOSIS — G4733 Obstructive sleep apnea (adult) (pediatric): Secondary | ICD-10-CM | POA: Diagnosis not present

## 2020-03-10 DIAGNOSIS — R918 Other nonspecific abnormal finding of lung field: Secondary | ICD-10-CM | POA: Diagnosis not present

## 2020-03-10 DIAGNOSIS — K5730 Diverticulosis of large intestine without perforation or abscess without bleeding: Secondary | ICD-10-CM | POA: Diagnosis not present

## 2020-03-10 DIAGNOSIS — R7309 Other abnormal glucose: Secondary | ICD-10-CM | POA: Diagnosis not present

## 2020-03-10 DIAGNOSIS — M4316 Spondylolisthesis, lumbar region: Secondary | ICD-10-CM | POA: Diagnosis not present

## 2020-03-10 DIAGNOSIS — I700 Atherosclerosis of aorta: Secondary | ICD-10-CM | POA: Diagnosis not present

## 2020-03-10 DIAGNOSIS — I2510 Atherosclerotic heart disease of native coronary artery without angina pectoris: Secondary | ICD-10-CM | POA: Diagnosis not present

## 2020-03-13 DIAGNOSIS — G4733 Obstructive sleep apnea (adult) (pediatric): Secondary | ICD-10-CM | POA: Diagnosis not present

## 2020-03-13 DIAGNOSIS — J4540 Moderate persistent asthma, uncomplicated: Secondary | ICD-10-CM | POA: Diagnosis not present

## 2020-03-13 DIAGNOSIS — R918 Other nonspecific abnormal finding of lung field: Secondary | ICD-10-CM | POA: Diagnosis not present

## 2020-03-13 DIAGNOSIS — R5383 Other fatigue: Secondary | ICD-10-CM | POA: Diagnosis not present

## 2020-03-13 DIAGNOSIS — J45909 Unspecified asthma, uncomplicated: Secondary | ICD-10-CM | POA: Diagnosis not present

## 2020-03-31 DIAGNOSIS — N390 Urinary tract infection, site not specified: Secondary | ICD-10-CM | POA: Diagnosis not present

## 2020-03-31 DIAGNOSIS — R197 Diarrhea, unspecified: Secondary | ICD-10-CM | POA: Diagnosis not present

## 2020-04-06 DIAGNOSIS — I700 Atherosclerosis of aorta: Secondary | ICD-10-CM | POA: Diagnosis not present

## 2020-04-06 DIAGNOSIS — R1084 Generalized abdominal pain: Secondary | ICD-10-CM | POA: Diagnosis not present

## 2020-04-06 DIAGNOSIS — N390 Urinary tract infection, site not specified: Secondary | ICD-10-CM | POA: Diagnosis not present

## 2020-04-06 DIAGNOSIS — R198 Other specified symptoms and signs involving the digestive system and abdomen: Secondary | ICD-10-CM | POA: Diagnosis not present

## 2020-04-06 DIAGNOSIS — R1030 Lower abdominal pain, unspecified: Secondary | ICD-10-CM | POA: Diagnosis not present

## 2020-04-06 DIAGNOSIS — R109 Unspecified abdominal pain: Secondary | ICD-10-CM | POA: Diagnosis not present

## 2020-04-06 DIAGNOSIS — R1012 Left upper quadrant pain: Secondary | ICD-10-CM | POA: Diagnosis not present

## 2020-04-06 DIAGNOSIS — K5730 Diverticulosis of large intestine without perforation or abscess without bleeding: Secondary | ICD-10-CM | POA: Diagnosis not present

## 2020-04-06 DIAGNOSIS — R1013 Epigastric pain: Secondary | ICD-10-CM | POA: Diagnosis not present

## 2020-04-06 DIAGNOSIS — R1011 Right upper quadrant pain: Secondary | ICD-10-CM | POA: Diagnosis not present

## 2020-04-06 DIAGNOSIS — B373 Candidiasis of vulva and vagina: Secondary | ICD-10-CM | POA: Diagnosis not present

## 2020-04-13 DIAGNOSIS — J45909 Unspecified asthma, uncomplicated: Secondary | ICD-10-CM | POA: Diagnosis not present

## 2020-04-22 DIAGNOSIS — J189 Pneumonia, unspecified organism: Secondary | ICD-10-CM | POA: Diagnosis not present

## 2020-04-22 DIAGNOSIS — Z20828 Contact with and (suspected) exposure to other viral communicable diseases: Secondary | ICD-10-CM | POA: Diagnosis not present

## 2020-05-14 DIAGNOSIS — J45909 Unspecified asthma, uncomplicated: Secondary | ICD-10-CM | POA: Diagnosis not present

## 2020-06-13 DIAGNOSIS — J45909 Unspecified asthma, uncomplicated: Secondary | ICD-10-CM | POA: Diagnosis not present

## 2020-07-06 DIAGNOSIS — I2510 Atherosclerotic heart disease of native coronary artery without angina pectoris: Secondary | ICD-10-CM | POA: Diagnosis not present

## 2020-07-06 DIAGNOSIS — R911 Solitary pulmonary nodule: Secondary | ICD-10-CM | POA: Diagnosis not present

## 2020-07-06 DIAGNOSIS — R918 Other nonspecific abnormal finding of lung field: Secondary | ICD-10-CM | POA: Diagnosis not present

## 2020-07-11 DIAGNOSIS — G4733 Obstructive sleep apnea (adult) (pediatric): Secondary | ICD-10-CM | POA: Diagnosis not present

## 2020-07-11 DIAGNOSIS — J4541 Moderate persistent asthma with (acute) exacerbation: Secondary | ICD-10-CM | POA: Diagnosis not present

## 2020-07-11 DIAGNOSIS — R918 Other nonspecific abnormal finding of lung field: Secondary | ICD-10-CM | POA: Diagnosis not present

## 2020-07-11 DIAGNOSIS — R5383 Other fatigue: Secondary | ICD-10-CM | POA: Diagnosis not present

## 2020-07-14 ENCOUNTER — Encounter: Payer: Self-pay | Admitting: Specialist

## 2020-07-14 DIAGNOSIS — J45909 Unspecified asthma, uncomplicated: Secondary | ICD-10-CM | POA: Diagnosis not present

## 2020-07-25 ENCOUNTER — Other Ambulatory Visit: Payer: Self-pay

## 2020-07-25 ENCOUNTER — Encounter: Payer: Self-pay | Admitting: Thoracic Surgery (Cardiothoracic Vascular Surgery)

## 2020-07-25 ENCOUNTER — Other Ambulatory Visit: Payer: Self-pay | Admitting: Thoracic Surgery (Cardiothoracic Vascular Surgery)

## 2020-07-25 ENCOUNTER — Institutional Professional Consult (permissible substitution): Payer: Medicare HMO | Admitting: Thoracic Surgery (Cardiothoracic Vascular Surgery)

## 2020-07-25 VITALS — BP 172/75 | HR 79 | Resp 18 | Ht 61.0 in | Wt 125.0 lb

## 2020-07-25 DIAGNOSIS — R911 Solitary pulmonary nodule: Secondary | ICD-10-CM

## 2020-07-25 NOTE — Progress Notes (Signed)
We'll squeeze her in as soon as possible.

## 2020-07-25 NOTE — Progress Notes (Signed)
PCP is Lowella Dandy, NP Referring Provider is Gardiner Rhyme, MD  Chief Complaint  Patient presents with  . Lung Lesion    Surgical consult    HPI: Mrs. Genna is sent for consultation regarding bilateral lung nodules.  Brithany Whitworth is a 74 year old woman with a past medical history significant for hypertension, hyperlipidemia, paroxysmal SVT, ablation, and asthma.  She has remote history of light tobacco use.  She smoked about 1/2 pack/day for approximately 15 years.  She quit 30 years ago.  She recently was evaluated in the emergency room for shortness of breath and wheezing.  A chest x-ray showed possible lung nodules.  That was confirmed with CT scan in June 2021.  She had a 1.9 x 1.6 cm groundglass opacity in the left apex and a 2.1 x 1.8 cm subsolid nodule in the posterior right lower lobe.  There is no mediastinal or hilar adenopathy.  A PET/CT showed no significant uptake in either nodule.    She had a follow-up CT on 07/06/2020 at [facility] which showed a 2.4 cm nodule in the right lower lobe with a 5 mm solid component and a 2 cm subsolid nodule in the left apex.    She has a remote history of light tobacco abuse.  Her FEV1 on pulmonary function testing was 1.20 or 63% of predicted.  FVC was 1.37 (54% of predicted).    She complains of shortness of breath with exertion.  It really takes very little exertion for her to feel short of breath.  She also complains of a chest heaviness.  She has not had any change in appetite or weight loss. Zubrod Score: At the time of surgery this patient's most appropriate activity status/level should be described as: []     0    Normal activity, no symptoms []     1    Restricted in physical strenuous activity but ambulatory, able to do out light work [x]     2    Ambulatory and capable of self care, unable to do work activities, up and about >50 % of waking hours                              []     3    Only limited self care, in bed greater than 50% of  waking hours []     4    Completely disabled, no self care, confined to bed or chair []     5    Moribund  Past Medical History:  Diagnosis Date  . Asthma   . Dyslipidemia (high LDL; low HDL) 08/13/2016  . HTN (hypertension) 11/20/2016  . Paroxysmal SVT (supraventricular tachycardia) ([place]) 07/05/2016    Past Surgical History:  Procedure Laterality Date  . ABDOMINAL HYSTERECTOMY    . CARDIAC CATHETERIZATION    . CARPAL TUNNEL RELEASE    . CATARACT EXTRACTION    . CHOLECYSTECTOMY    . SVT ABLATION N/A 10/19/2018   Procedure: SVT ABLATION;  Surgeon: Evans Lance, MD;  Location: [facility];  Service: Cardiovascular;  Laterality: N/A;  . TOE SURGERY      Family History  Problem Relation Age of Onset  . Thyroid disease Brother   . Dementia Father   . Heart disease Mother   . Diabetes Mother   . Thyroid disease Sister     Social History Social History   Tobacco Use  . Smoking status: Former Smoker  Packs/day: 0.50    Years: 15.00    Pack years: 7.50    Types: Cigarettes  . Smokeless tobacco: Former Systems developer    Quit date: 09/09/1989  Vaping Use  . Vaping Use: Never used  Substance Use Topics  . Alcohol use: No  . Drug use: No    Current Outpatient Medications  Medication Sig Dispense Refill  . Biotin 1000 MCG tablet Take 1,000 mcg by mouth daily.    . clonazePAM (KLONOPIN) 1 MG tablet Take 1 mg by mouth 3 (three) times daily as needed for anxiety.    . diphenoxylate-atropine (LOMOTIL) 2.5-0.025 MG tablet Take 1 tablet by mouth every 6 (six) hours as needed for diarrhea or loose stools.    Marland Kitchen FLUoxetine (PROZAC) 20 MG capsule Take 20 mg by mouth 2 (two) times daily.    Marland Kitchen levalbuterol (XOPENEX) 1.25 MG/3ML nebulizer solution Take 1.25 mg by nebulization every 4 (four) hours as needed for wheezing or shortness of breath.     . losartan-hydrochlorothiazide (HYZAAR) 50-12.5 MG tablet Take 1 tablet by mouth daily.    . montelukast (SINGULAIR) 10 MG tablet Take 10 mg by  mouth daily.     . Multiple Vitamin (MULTI-VITAMINS) TABS Take 1 tablet by mouth daily.     . nabumetone (RELAFEN) 750 MG tablet Take 750 mg by mouth 2 (two) times daily.    Marland Kitchen omeprazole (PRILOSEC) 20 MG capsule Take 20 mg by mouth 2 (two) times daily.     . simvastatin (ZOCOR) 20 MG tablet Take 20 mg by mouth every Monday, Wednesday, and Friday.     . trimethoprim (TRIMPEX) 100 MG tablet Take 100 mg by mouth daily.     No current facility-administered medications for this visit.    Allergies  Allergen Reactions  . Aspirin Other (See Comments)    Bleeding (intolerance)  . Buspirone Other (See Comments)    Patient does not recall taking this med  . Celecoxib Other (See Comments)    Patient cannot remember reaction  . Chlorpheniramine Other (See Comments)    Patient cannot remember reaction  . Citalopram Other (See Comments)    Patient cannot remember reaction  . Ezetimibe Other (See Comments)    Myalgias (intolerance)  . Hydrocodone-Acetaminophen Itching  . Prochlorperazine Other (See Comments)    "Makes me feel like I'm having a face stroke"  . Statins Other (See Comments)    Myalgias (intolerance)  . Venlafaxine Other (See Comments)    Patient cannot remember reaction    Review of Systems  Constitutional: Positive for fatigue and unexpected weight change (Has lost 5 pounds in last 3 months). Negative for activity change and appetite change.  HENT: Positive for hearing loss. Negative for trouble swallowing and voice change.   Eyes: Negative for visual disturbance.  Respiratory: Positive for shortness of breath and wheezing.        3 L of oxygen at night  Gastrointestinal: Positive for abdominal pain, blood in stool and diarrhea.       Hiatal hernia/reflux  Genitourinary: Positive for dysuria and hematuria.  Musculoskeletal: Positive for arthralgias, gait problem and joint swelling.       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 Negative for seizures, syncope and weakness.  Hematological: Bruises/bleeds easily.  Psychiatric/Behavioral: Positive for dysphoric mood. The patient is nervous/anxious.     BP (!) 172/75 (BP Location: Right Arm, Patient Position: Sitting)   Pulse 79   Resp 18  Ht 5\' 1"  (1.549 m)   Wt 125 lb (56.7 kg)   SpO2 91% Comment: RA  BMI 23.62 kg/m  Physical Exam Vitals reviewed.  Constitutional:      General: She is not in acute distress.    Appearance: Normal appearance.  HENT:     Head: Normocephalic and atraumatic.  Eyes:     General: No scleral icterus.    Extraocular Movements: Extraocular movements intact.  Neck:     Vascular: No carotid bruit.  Cardiovascular:     Rate and Rhythm: Normal rate and regular rhythm.     Heart sounds: Normal heart sounds. No murmur heard.   Pulmonary:     Effort: Pulmonary effort is normal. No respiratory distress.     Breath sounds: Normal breath sounds. No wheezing.  Abdominal:     General: There is no distension.     Palpations: Abdomen is soft.     Tenderness: There is no abdominal tenderness.  Musculoskeletal:        General: No swelling.     Cervical back: Neck supple.  Lymphadenopathy:     Cervical: No cervical adenopathy.  Skin:    General: Skin is warm and dry.  Neurological:     General: No focal deficit present.     Mental Status: She is alert and oriented to person, place, and time.     Cranial Nerves: No cranial nerve deficit.     Motor: No weakness.     Gait: Gait normal.  Psychiatric:     Comments: Anxious    Diagnostic Tests: I reviewed the PET/CT images which are available through the PACS system but not epic. I also reviewed the CT from [place] on 07/06/2020 on the disc provided.  CT chest without IV contrast 07/06/2020 [facility] Impression 2.4 cm semisolid in the right lower lobe and 2 cm nonsolid nodule in the left upper lobe are concerning for low-grade primary lung adenocarcinoma or  adenocarcinoma in situ. Per Fleischner Society guidelines, chest CT in 3 to 6 months is recommended, unless tissue sampling is performed in the interim. Electronically signed by: Charlett Lango  Nuclear medicine PET skull base to thigh 03/10/2020 Impression 1. Areas of abnormality in the left upper lobe and rib (sic) posterior right upper (sic) lobe without significant FDG uptake, uptake less than mediastinal blood pool. Findings remain nonspecific in the short interval, based on morphology indolent bronchogenic neoplasm is considered. Lack of significant solid component on previous image makes PET assessment nonspecific. 2. 50-month follow-up may be helpful to determine further management as discussed previously. 3. Nodular changes in the lingula may be due to scarring showing no FDG uptake. Consider attention to this area on follow-up as well. Aortic atherosclerosis Electronically signed Zetta Bills, MD  Impression: Mirtie Bastyr is a 74 year old woman with a past history significant for hypertension, hyperlipidemia, paroxysmal SVT, ablation, and asthma. She does have a remote history of smoking but it was a relatively small amount. She smoked about 1/2 pack/day for around 15 years before quitting 30 years ago. She does have issues with asthma.  Back in the summer she was having some shortness of breath and wheezing. She went to the emergency department. She was found to have right lower lobe and left apical lung nodules. The right lower lobe nodule was mixed density, the left apical nodule is more of a pure groundglass opacity. On PET CT neither lesion had appreciable metabolic activity. She recently had a 74-month follow-up CT which showed  an increase in size of the right lower lobe nodule to 2.4 cm.  I reviewed the images with Mrs. Blanks and her family. We discussed the differential diagnosis. Infectious and inflammatory nodules are in the differential. 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 Technically both lesions are resectable. However I have some concerns about her functional status.  She has dyspnea with exertion out of proportion to the findings on her pulmonary function testing.  The diffusion capacity was not assessed, but I do not see anything on CT that would suggest that being a major issue.  6-minute walk test-she walked 864 feet. There was no desaturation.  That is significantly further than I would have suspected given the history she reported. Limited by right knee pain.  No complaints during the test but she told me afterwards she had heaviness.  She has known history of coronary disease. She has seen Dr. Dani Gobble Croitoru from cardiology in the past. She did have an ablation by Dr. Lovena Le previously. She does have coronary calcification on CT. The thing that is most concerning is that she complains of heaviness with along with her shortness of breath. I think she needs a cardiac evaluation prior to any kind of surgical intervention. She prefers to see Dr. Sallyanne Kuster since she has seen him previously.  I discussed the possibility of treating either surgically or with radiation therapy with the patient and her family. She strongly wants to pursue surgery. She would need staged procedures. The plan would be to do the right side first as that is the more worrisome nodule.  Plan: Cardiology evaluation for preop clearance We will repeat pulmonary function testing with and without bronchodilators and get a diffusion capacity as well.  Follow-up in about 3 weeks to discuss results and consider surgical resection.  I spent over 45 minutes in review of images, review of records, and in consultation with Mrs. Boehme today. Melrose Nakayama, MD [facility] [phone]

## 2020-07-25 NOTE — Progress Notes (Signed)
6 Minute Walk Test Results  Patient: Angela Phillips Date:  07/25/2020   Supplemental O2 during test? NO          Baseline   End  Time 1525         1531 Heartrate  79    89 Dyspnea  no    no Fatigue  no    yes O2 sat   91%    97% Blood pressure 172/75    166/80   Patient ambulated at a steady pace for a total distance of 864 feet with no stops.  Ambulation was limited primarily due to right knee pain  Overall the test was tolerated well

## 2020-07-26 LAB — PULMONARY FUNCTION TEST

## 2020-08-02 ENCOUNTER — Encounter: Payer: Self-pay | Admitting: Cardiovascular Disease

## 2020-08-02 ENCOUNTER — Other Ambulatory Visit: Payer: Self-pay

## 2020-08-02 ENCOUNTER — Ambulatory Visit (INDEPENDENT_AMBULATORY_CARE_PROVIDER_SITE_OTHER): Payer: Medicare HMO | Admitting: Cardiovascular Disease

## 2020-08-02 VITALS — BP 160/90 | HR 68 | Ht 61.0 in | Wt 126.2 lb

## 2020-08-02 DIAGNOSIS — Z01810 Encounter for preprocedural cardiovascular examination: Secondary | ICD-10-CM

## 2020-08-02 DIAGNOSIS — I10 Essential (primary) hypertension: Secondary | ICD-10-CM

## 2020-08-02 DIAGNOSIS — E7800 Pure hypercholesterolemia, unspecified: Secondary | ICD-10-CM | POA: Diagnosis not present

## 2020-08-02 DIAGNOSIS — I471 Supraventricular tachycardia: Secondary | ICD-10-CM | POA: Diagnosis not present

## 2020-08-02 DIAGNOSIS — R072 Precordial pain: Secondary | ICD-10-CM | POA: Diagnosis not present

## 2020-08-02 NOTE — Patient Instructions (Signed)
Medication Instructions:  No changes *If you need a refill on your cardiac medications before your next appointment, please call your pharmacy*   Lab Work: Your provider would like for you to have the following labs today: Lipid and CMET  If you have labs (blood work) drawn today and your tests are completely normal, you will receive your results only by: Marland Kitchen MyChart Message (if you have MyChart) OR . A paper copy in the mail If you have any lab test that is abnormal or we need to change your treatment, we will call you to review the results.   Testing/Procedures: Your physician has requested that you have a lexiscan myoview. For further information please visit [email or web address]. Please follow instruction sheet, as given. This will take place at [place], [address]  How to prepare for your Myocardial Perfusion Test:  Do not eat or drink 3 hours prior to your test, except you may have water.  Do not consume products containing caffeine (regular or decaffeinated) 12 hours prior to your test. (ex: coffee, chocolate, sodas, tea).  Do bring a list of your current medications with you.  If not listed below, you may take your medications as normal.  Do wear comfortable clothes (no dresses or overalls) and walking shoes, tennis shoes preferred (No heels or open toe shoes are allowed).  Do NOT wear cologne, perfume, aftershave, or lotions (deodorant is allowed).  The test will take approximately 3 to 4 hours to complete  If these instructions are not followed, your test will have to be rescheduled.     Follow-Up: At [facility], you and your health needs are our priority.  As part of our continuing mission to provide you with exceptional heart care, we have created designated Provider Care Teams.  These Care Teams include your primary Cardiologist (physician) and Advanced Practice Providers (APPs -  Physician Assistants and Nurse Practitioners) who all work together to  provide you with the care you need, when you need it.  We recommend signing up for the patient portal called "MyChart".  Sign up information is provided on this After Visit Summary.  MyChart is used to connect with patients for Virtual Visits (Telemedicine).  Patients are able to view lab/test results, encounter notes, upcoming appointments, etc.  Non-urgent messages can be sent to your provider as well.   To learn more about what you can do with MyChart, go to [email or web address].    Your next appointment:   3 month(s)  The format for your next appointment:   In Person  Provider:   You may see Dr. Sallyanne Kuster or one of the following Advanced Practice Providers on your designated Care Team:    Almyra Deforest, PA-C  Fabian Sharp, PA-C or   Roby Lofts, [place]

## 2020-08-02 NOTE — Progress Notes (Signed)
Cardiology Office Note:    Date:  08/04/2020   ID:  CYNDI Phillips, DOB 01-Oct-1945, MRN [id number]  PCP:  Lowella Dandy, NP  Cardiologist:  Aniyha Tate Electrophysiologist:  Lovena Le  Referring MD: Lowella Dandy, NP   Chief Complaint  Patient presents with  . Chest Pain  . Shortness of Breath  . Pre-op Exam   History of Present Illness:    Angela Phillips is a 74 y.o. 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  She has problems with exertional dyspnea and exertional chest heaviness.  She has difficulty distinguishing the 2 complaints.  Sometimes they will also occur at rest and are relieved by bronchodilators.  At other times shortness of breath appears to occur independently of any kind of chest discomfort.  She had a normal nuclear dobutamine stress test in 2014 and reports that she had a cardiac catheterization more than 15 years ago, reportedly normal.  She has normal left ventricular systolic function by echocardiogram performed in 2017.  CT performed July 06, 2020 shows "moderate coronary artery atherosclerotic calcifications".  Thickened aortic atherosclerosis is described both on the chest CT and a previous abdomen and pelvis CT from 2020.  She reports that her brother had coronary surgery around his mid 61s or early 58s.  She is taking a statin for lipid lowering.  In May her LDL was 193 while taking simvastatin 20 mg just 3 days a week, but she has since increased this to 40 mg every evening.  Past Medical History:  Diagnosis Date  . Asthma   . Dyslipidemia (high LDL; low HDL) 08/13/2016  . HTN (hypertension) 11/20/2016  . Paroxysmal SVT (supraventricular tachycardia) ([place]) 07/05/2016    Past Surgical History:  Procedure Laterality Date  . ABDOMINAL HYSTERECTOMY    .  CARDIAC CATHETERIZATION    . CARPAL TUNNEL RELEASE    . CATARACT EXTRACTION    . CHOLECYSTECTOMY    . SVT ABLATION N/A 10/19/2018   Procedure: SVT ABLATION;  Surgeon: Evans Lance, MD;  Location: [facility];  Service: Cardiovascular;  Laterality: N/A;  . TOE SURGERY      Current Medications: Current Meds  Medication Sig  . Biotin 1000 MCG tablet Take 1,000 mcg by mouth daily.  . clonazePAM (KLONOPIN) 1 MG tablet Take 1 mg by mouth 3 (three) times daily as needed for anxiety.  . diphenoxylate-atropine (LOMOTIL) 2.5-0.025 MG tablet Take 1 tablet by mouth every 6 (six) hours as needed for diarrhea or loose stools.  Marland Kitchen FLUoxetine (PROZAC) 20 MG capsule Take 20 mg by mouth 2 (two) times daily.  Marland Kitchen levalbuterol (XOPENEX) 1.25 MG/3ML nebulizer solution Take 1.25 mg by nebulization every 4 (four) hours as needed for wheezing or shortness of breath.   . losartan-hydrochlorothiazide (HYZAAR) 50-12.5 MG tablet Take 1 tablet by mouth daily.  . montelukast (SINGULAIR) 10 MG tablet Take 10 mg by mouth daily.   . Multiple Vitamin (MULTI-VITAMINS) TABS Take 1 tablet by mouth daily.   . nabumetone (RELAFEN) 750 MG tablet Take 750 mg by mouth 2 (two) times daily.  Marland Kitchen omeprazole (PRILOSEC) 20 MG capsule Take 20 mg by mouth 2 (two) times daily.   . simvastatin (ZOCOR) 20 MG tablet Take 20 mg by mouth every Monday, Wednesday, and Friday.   . trimethoprim (TRIMPEX) 100 MG tablet  Take 100 mg by mouth daily.     Allergies:   Aspirin, Buspirone, Celecoxib, Chlorpheniramine, Citalopram, Ezetimibe, Hydrocodone-acetaminophen, Prochlorperazine, Statins, and Venlafaxine   Social History   Socioeconomic History  . Marital status: Married    Spouse name: Not on file  . Number of children: Not on file  . Years of education: Not on file  . Highest education level: Not on file  Occupational History  . Not on file  Tobacco Use  . Smoking status: Former Smoker    Packs/day: 0.50    Years: 15.00    Pack  years: 7.50    Types: Cigarettes  . Smokeless tobacco: Former Systems developer    Quit date: 09/09/1989  Vaping Use  . Vaping Use: Never used  Substance and Sexual Activity  . Alcohol use: No  . Drug use: No  . Sexual activity: Not on file  Other Topics Concern  . Not on file  Social History Narrative  . Not on file   Social Determinants of Health   Financial Resource Strain:   . Difficulty of Paying Living Expenses: Not on file  Food Insecurity:   . Worried About Charity fundraiser in the Last Year: Not on file  . Ran Out of Food in the Last Year: Not on file  Transportation Needs:   . Lack of Transportation (Medical): Not on file  . Lack of Transportation (Non-Medical): Not on file  Physical Activity:   . Days of Exercise per Week: Not on file  . Minutes of Exercise per Session: Not on file  Stress:   . Feeling of Stress : Not on file  Social Connections:   . Frequency of Communication with Friends and Family: Not on file  . Frequency of Social Gatherings with Friends and Family: Not on file  . Attends Religious Services: Not on file  . Active Member of Clubs or Organizations: Not on file  . Attends Archivist Meetings: Not on file  . Marital Status: Not on file     Family History: The patient's family history includes Dementia in her father; Diabetes in her mother; Heart disease in her mother; Thyroid disease in her brother and sister.  ROS:   Please see the history of present illness.     All other systems reviewed and are negative.  EKGs/Labs/Other Studies Reviewed:    The following studies were reviewed today: Chest CT  EKG:  EKG is ordered today.  Shows sinus rhythm with PACs, right bundle branch block, no ischemic ST-T changes Recent Labs: 08/02/2020: ALT 29; BUN 13; Creatinine, Ser 0.74; Potassium 4.5; Sodium 141  24 2021 hemoglobin A1c 6%, hemoglobin 14.6, creatinine 0.4, potassium 4.0 Recent Lipid Panel    Component Value Date/Time   CHOL 270 (H)  08/02/2020 1233   TRIG 176 (H) 08/02/2020 1233   HDL 56 08/02/2020 1233   CHOLHDL 4.8 (H) 08/02/2020 1233   LDLCALC 181 (H) 08/02/2020 1233    01/11/2020 total cholesterol 285, HDL 49, LDL 193, triglycerides 226  Physical Exam:    VS:  BP (!) 160/90   Pulse 68   Ht 5\' 1"  (1.549 m)   Wt 126 lb 3.2 oz (57.2 kg)   SpO2 96%   BMI 23.85 kg/m     Wt Readings from Last 3 Encounters:  08/02/20 126 lb 3.2 oz (57.2 kg)  07/25/20 125 lb (56.7 kg)  10/19/18 132 lb (59.9 kg)      General: Alert, oriented x3, no distress, appears  well Head: no evidence of trauma, PERRL, EOMI, no exophtalmos or lid lag, no myxedema, no xanthelasma; normal ears, nose and oropharynx Neck: normal jugular venous pulsations and no hepatojugular reflux; brisk carotid pulses without delay and no carotid bruits Chest: clear to auscultation, no signs of consolidation by percussion or palpation, normal fremitus, symmetrical and full respiratory excursions Cardiovascular: normal position and quality of the apical impulse, regular rhythm, normal first and second heart sounds, no murmurs, rubs or gallops Abdomen: no tenderness or distention, no masses by palpation, no abnormal pulsatility or arterial bruits, normal bowel sounds, no hepatosplenomegaly Extremities: no clubbing, cyanosis or edema; 2+ radial, ulnar and brachial pulses bilaterally; 2+ right femoral, posterior tibial and dorsalis pedis pulses; 2+ left femoral, posterior tibial and dorsalis pedis pulses; no subclavian or femoral bruits Neurological: grossly nonfocal Psych: Normal mood and affect, anxious   ASSESSMENT:    1. SVT (supraventricular tachycardia) ([place])   2. Precordial pain   3. Essential hypertension   4. Hypercholesterolemia   5. Preoperative cardiovascular examination    PLAN:    In order of problems listed above:  1. AVNRT: No recurrence following successful ablation more than a year ago. 2. Chest pain: Symptoms are not entirely typical,  but she has numerous coronary risk factors and evidence of atherosclerosis on imaging studies.  Schedule for [employer].  If the study is abnormal she should have cardiac catheterization before she undergoes thoracotomy.  If significant CAD is identified, will have to have a consultation with the thoracic surgeon before proceeding with any revascularization procedures, so as not to delay necessary lung surgery. 3. HTN: Blood pressure is elevated today, but she reports that usually is not so high.  No changes were made to her medications today.  Avoiding beta-blockers due to history of reactive airway disease. 4. HLP: Reportedly intolerant to statins and Zetia in the past due to myalgia, but currently appears to be tolerating daily simvastatin without complaints.  Target LDL cholesterol less than 70.  Recheck lipid panel.  If statin is not tolerated or insufficient to achieve target, consider PCSK9 inhibitors. 5. Preop exam: Defer recommendation regarding preoperative risk assessment until we have the results of her coronary work-up.   Medication Adjustments/Labs and Tests Ordered: Current medicines are reviewed at length with the patient today.  Concerns regarding medicines are outlined above.  Orders Placed This Encounter  Procedures  . Comprehensive metabolic panel  . Lipid panel  . MYOCARDIAL PERFUSION IMAGING  . EKG 12-Lead   No orders of the defined types were placed in this encounter.   Patient Instructions  Medication Instructions:  No changes *If you need a refill on your cardiac medications before your next appointment, please call your pharmacy*   Lab Work: Your provider would like for you to have the following labs today: Lipid and CMET  If you have labs (blood work) drawn today and your tests are completely normal, you will receive your results only by: Marland Kitchen MyChart Message (if you have MyChart) OR . A paper copy in the mail If you have any lab test that is abnormal or we  need to change your treatment, we will call you to review the results.   Testing/Procedures: Your physician has requested that you have a lexiscan myoview. For further information please visit [email or web address]. Please follow instruction sheet, as given. This will take place at [place], [address]  How to prepare for your Myocardial Perfusion Test:  Do not eat or drink 3 hours  prior to your test, except you may have water.  Do not consume products containing caffeine (regular or decaffeinated) 12 hours prior to your test. (ex: coffee, chocolate, sodas, tea).  Do bring a list of your current medications with you.  If not listed below, you may take your medications as normal.  Do wear comfortable clothes (no dresses or overalls) and walking shoes, tennis shoes preferred (No heels or open toe shoes are allowed).  Do NOT wear cologne, perfume, aftershave, or lotions (deodorant is allowed).  The test will take approximately 3 to 4 hours to complete  If these instructions are not followed, your test will have to be rescheduled.     Follow-Up: At [facility], you and your health needs are our priority.  As part of our continuing mission to provide you with exceptional heart care, we have created designated Provider Care Teams.  These Care Teams include your primary Cardiologist (physician) and Advanced Practice Providers (APPs -  Physician Assistants and Nurse Practitioners) who all work together to provide you with the care you need, when you need it.  We recommend signing up for the patient portal called "MyChart".  Sign up information is provided on this After Visit Summary.  MyChart is used to connect with patients for Virtual Visits (Telemedicine).  Patients are able to view lab/test results, encounter notes, upcoming appointments, etc.  Non-urgent messages can be sent to your provider as well.   To learn more about what you can do with MyChart, go to  [email or web address].    Your next appointment:   3 month(s)  The format for your next appointment:   In Person  Provider:   You may see Dr. Sallyanne Kuster or one of the following Advanced Practice Providers on your designated Care Team:    Almyra Deforest, PA-C  Fabian Sharp, [place] or   Roby Lofts, PA-C         Signed, Sanda Klein, MD  08/04/2020 1:35 PM    [place]

## 2020-08-03 LAB — COMPREHENSIVE METABOLIC PANEL
ALT: 29 IU/L (ref 0–32)
AST: 26 IU/L (ref 0–40)
Albumin/Globulin Ratio: 1.9 (ref 1.2–2.2)
Albumin: 4.1 g/dL (ref 3.7–4.7)
Alkaline Phosphatase: 73 IU/L (ref 44–121)
BUN/Creatinine Ratio: 18 (ref 12–28)
BUN: 13 mg/dL (ref 8–27)
Bilirubin Total: 0.3 mg/dL (ref 0.0–1.2)
CO2: 30 mmol/L — ABNORMAL HIGH (ref 20–29)
Calcium: 9.5 mg/dL (ref 8.7–10.3)
Chloride: 101 mmol/L (ref 96–106)
Creatinine, Ser: 0.74 mg/dL (ref 0.57–1.00)
GFR calc Af Amer: 93 mL/min/{1.73_m2} (ref >59)
GFR calc non Af Amer: 81 mL/min/{1.73_m2} (ref >59)
Globulin, Total: 2.2 g/dL (ref 1.5–4.5)
Glucose: 84 mg/dL (ref 65–99)
Potassium: 4.5 mmol/L (ref 3.5–5.2)
Sodium: 141 mmol/L (ref 134–144)
Total Protein: 6.3 g/dL (ref 6.0–8.5)

## 2020-08-03 LAB — LIPID PANEL
Chol/HDL Ratio: 4.8 ratio — ABNORMAL HIGH (ref 0.0–4.4)
Cholesterol, Total: 270 mg/dL — ABNORMAL HIGH (ref 100–199)
HDL: 56 mg/dL (ref >39)
LDL Chol Calc (NIH): 181 mg/dL — ABNORMAL HIGH (ref 0–99)
Triglycerides: 176 mg/dL — ABNORMAL HIGH (ref 0–149)
VLDL Cholesterol Cal: 33 mg/dL (ref 5–40)

## 2020-08-04 ENCOUNTER — Encounter: Payer: Self-pay | Admitting: Cardiovascular Disease

## 2020-08-07 ENCOUNTER — Telehealth: Payer: Self-pay | Admitting: *Deleted

## 2020-08-07 ENCOUNTER — Other Ambulatory Visit (HOSPITAL_COMMUNITY)
Admission: RE | Admit: 2020-08-07 | Discharge: 2020-08-07 | Disposition: A | Discharge status: Home / Self Care | Payer: Medicare HMO | Source: Ambulatory Visit | Attending: Thoracic Surgery (Cardiothoracic Vascular Surgery) | Admitting: Thoracic Surgery (Cardiothoracic Vascular Surgery)

## 2020-08-07 DIAGNOSIS — Z20822 Contact with and (suspected) exposure to covid-19: Secondary | ICD-10-CM | POA: Insufficient documentation

## 2020-08-07 DIAGNOSIS — Z01812 Encounter for preprocedural laboratory examination: Secondary | ICD-10-CM | POA: Diagnosis not present

## 2020-08-07 LAB — SARS CORONAVIRUS 2 (TAT 6-24 HRS): SARS Coronavirus 2: NEGATIVE

## 2020-08-07 NOTE — Telephone Encounter (Signed)
Angela Phillips had questions regarding upcoming appointment's. Informed her she needs to get her covid test  today off [employer] in [place]. Confirmed her PFT's on Thursday, December 2 and went over instructions for that. All questions answered.

## 2020-08-10 ENCOUNTER — Other Ambulatory Visit: Payer: Self-pay

## 2020-08-10 ENCOUNTER — Ambulatory Visit (HOSPITAL_COMMUNITY)
Admission: RE | Admit: 2020-08-10 | Discharge: 2020-08-10 | Disposition: A | Discharge status: Home / Self Care | Payer: Medicare HMO | Source: Ambulatory Visit | Attending: Thoracic Surgery (Cardiothoracic Vascular Surgery) | Admitting: Thoracic Surgery (Cardiothoracic Vascular Surgery)

## 2020-08-10 DIAGNOSIS — J449 Chronic obstructive pulmonary disease, unspecified: Secondary | ICD-10-CM | POA: Insufficient documentation

## 2020-08-10 DIAGNOSIS — Z87891 Personal history of nicotine dependence: Secondary | ICD-10-CM | POA: Insufficient documentation

## 2020-08-10 DIAGNOSIS — R911 Solitary pulmonary nodule: Secondary | ICD-10-CM | POA: Insufficient documentation

## 2020-08-10 LAB — PULMONARY FUNCTION TEST
DL/VA % pred: 105 %
DL/VA: 4.46 ml/min/mmHg/L
DLCO unc % pred: 74 %
DLCO unc: 12.98 ml/min/mmHg
FEF 25-75 Post: 1.73 L/sec
FEF 25-75 Pre: 1.24 L/sec
FEF2575-%Change-Post: 39 %
FEF2575-%Pred-Post: 109 %
FEF2575-%Pred-Pre: 78 %
FEV1-%Change-Post: 9 %
FEV1-%Pred-Post: 77 %
FEV1-%Pred-Pre: 70 %
FEV1-Post: 1.47 L
FEV1-Pre: 1.34 L
FEV1FVC-%Change-Post: 0 %
FEV1FVC-%Pred-Pre: 105 %
FEV6-%Change-Post: 9 %
FEV6-%Pred-Post: 76 %
FEV6-%Pred-Pre: 70 %
FEV6-Post: 1.84 L
FEV6-Pre: 1.68 L
FEV6FVC-%Change-Post: 0 %
FEV6FVC-%Pred-Post: 104 %
FEV6FVC-%Pred-Pre: 105 %
FVC-%Change-Post: 10 %
FVC-%Pred-Post: 73 %
FVC-%Pred-Pre: 66 %
FVC-Post: 1.86 L
FVC-Pre: 1.69 L
Post FEV1/FVC ratio: 79 %
Post FEV6/FVC ratio: 99 %
Pre FEV1/FVC ratio: 79 %
Pre FEV6/FVC Ratio: 100 %
RV % pred: 84 %
RV: 1.76 L
TLC % pred: 79 %
TLC: 3.64 L

## 2020-08-10 MED ORDER — ALBUTEROL SULFATE (2.5 MG/3ML) 0.083% IN NEBU
2.5000 mg | INHALATION_SOLUTION | Freq: Once | RESPIRATORY_TRACT | Status: AC
  Administered 2020-08-10: 2.5 mg via RESPIRATORY_TRACT

## 2020-08-11 ENCOUNTER — Other Ambulatory Visit: Payer: Self-pay

## 2020-08-11 ENCOUNTER — Ambulatory Visit (HOSPITAL_COMMUNITY)
Admission: RE | Admit: 2020-08-11 | Discharge: 2020-08-11 | Disposition: A | Discharge status: Home / Self Care | Payer: Medicare HMO | Source: Ambulatory Visit | Attending: Cardiovascular Disease | Admitting: Cardiovascular Disease

## 2020-08-11 DIAGNOSIS — R072 Precordial pain: Secondary | ICD-10-CM

## 2020-08-11 LAB — MYOCARDIAL PERFUSION IMAGING
LV dias vol: 38 mL (ref 46–106)
LV sys vol: 2 mL
Peak HR: 101 {beats}/min
Rest HR: 73 {beats}/min
SDS: 2
SRS: 2
SSS: 4
TID: 1.1

## 2020-08-11 MED ORDER — AMINOPHYLLINE 25 MG/ML IV SOLN
75.0000 mg | Freq: Once | INTRAVENOUS | Status: AC
  Administered 2020-08-11: 75 mg via INTRAVENOUS

## 2020-08-11 MED ORDER — REGADENOSON 0.4 MG/5ML IV SOLN
0.4000 mg | Freq: Once | INTRAVENOUS | Status: AC
Start: 2020-08-11 — End: 2020-08-11
  Administered 2020-08-11: 0.4 mg via INTRAVENOUS

## 2020-08-11 MED ORDER — TECHNETIUM TC 99M TETROFOSMIN IV KIT
10.8000 | PACK | Freq: Once | INTRAVENOUS | Status: AC | PRN
  Administered 2020-08-11: 10.8 via INTRAVENOUS
  Filled 2020-08-11: qty 11

## 2020-08-11 MED ORDER — TECHNETIUM TC 99M TETROFOSMIN IV KIT
32.6000 | PACK | Freq: Once | INTRAVENOUS | Status: AC | PRN
  Administered 2020-08-11: 32.6 via INTRAVENOUS
  Filled 2020-08-11: qty 33

## 2020-08-13 DIAGNOSIS — J45909 Unspecified asthma, uncomplicated: Secondary | ICD-10-CM | POA: Diagnosis not present

## 2020-08-15 ENCOUNTER — Other Ambulatory Visit: Payer: Self-pay | Admitting: *Deleted

## 2020-08-15 ENCOUNTER — Encounter: Payer: Self-pay | Admitting: *Deleted

## 2020-08-15 ENCOUNTER — Encounter: Payer: Self-pay | Admitting: Thoracic Surgery (Cardiothoracic Vascular Surgery)

## 2020-08-15 ENCOUNTER — Ambulatory Visit: Payer: Medicare HMO | Admitting: Thoracic Surgery (Cardiothoracic Vascular Surgery)

## 2020-08-15 ENCOUNTER — Other Ambulatory Visit: Payer: Self-pay

## 2020-08-15 VITALS — BP 159/79 | HR 82 | Resp 18 | Ht 61.0 in | Wt 125.4 lb

## 2020-08-15 DIAGNOSIS — R918 Other nonspecific abnormal finding of lung field: Secondary | ICD-10-CM

## 2020-08-15 DIAGNOSIS — R911 Solitary pulmonary nodule: Secondary | ICD-10-CM

## 2020-08-15 NOTE — Progress Notes (Signed)
[address]       [place] [address]             [phone]     HPI: Angela Phillips returns to further discuss management of her bilateral lung nodules.  Angela Phillips is a 74 year old woman with a past medical history significant for hypertension, hyperlipidemia, paroxysmal SVT, ablation, and asthma.  She has remote history of light tobacco use.  She smoked about 1/2 pack/day for approximately 15 years.  She quit 30 years ago.  She recently was evaluated in the emergency room for shortness of breath and wheezing.  A chest x-ray showed possible lung nodules.  That was confirmed with CT scan in June 2021.  She had a 1.9 x 1.6 cm groundglass opacity in the left apex and a 2.1 x 1.8 cm subsolid nodule in the posterior right lower lobe.  There is no mediastinal or hilar adenopathy.  A PET/CT showed no significant uptake in either nodule.    She had a follow-up CT on 07/06/2020 at [facility] which showed a 2.4 cm nodule in the right lower lobe with a 5 mm solid component and a 2 cm subsolid nodule in the left apex.    I saw her on 07/25/2020. She complained of shortness of breath with even relatively minor exertion. Wears 3L  O2 at night. I felt her symptoms were out of proportion to her PFTs and referred her to Cardiology. She saw Dr. Sallyanne Kuster and had a nuclear stress test.   She now returns to further discuss management of her lung nodules.  Past Medical History:  Diagnosis Date  . Asthma   . Dyslipidemia (high LDL; low HDL) 08/13/2016  . HTN (hypertension) 11/20/2016  . Paroxysmal SVT (supraventricular tachycardia) ([place]) 07/05/2016    Current Outpatient Medications  Medication Sig Dispense Refill  . clonazePAM (KLONOPIN) 1 MG tablet Take 1 mg by mouth 3 (three) times daily as needed for anxiety.    Marland Kitchen FLUoxetine (PROZAC) 20 MG capsule Take 20 mg by mouth 2 (two) times daily.    Marland Kitchen gabapentin (NEURONTIN) 100 MG capsule Take 100 mg by mouth 3 (three) times daily.    Marland Kitchen  levalbuterol (XOPENEX) 1.25 MG/3ML nebulizer solution Take 1.25 mg by nebulization every 4 (four) hours as needed for wheezing or shortness of breath.     . losartan-hydrochlorothiazide (HYZAAR) 50-12.5 MG tablet Take 1 tablet by mouth daily.    . montelukast (SINGULAIR) 10 MG tablet Take 10 mg by mouth daily.     . Multiple Vitamin (MULTI-VITAMINS) TABS Take 1 tablet by mouth daily.     . nabumetone (RELAFEN) 750 MG tablet Take 750 mg by mouth 2 (two) times daily.    Marland Kitchen omeprazole (PRILOSEC) 20 MG capsule Take 20 mg by mouth 2 (two) times daily.     Marland Kitchen omeprazole (PRILOSEC) 40 MG capsule Take 40 mg by mouth daily.    . simvastatin (ZOCOR) 20 MG tablet Take 20 mg by mouth every Monday, Wednesday, and Friday.     . SYMBICORT 160-4.5 MCG/ACT inhaler Inhale into the lungs.    . diphenoxylate-atropine (LOMOTIL) 2.5-0.025 MG tablet Take 1 tablet by mouth every 6 (six) hours as needed for diarrhea or loose stools. (Patient not taking: Reported on 08/15/2020)     No current facility-administered medications for this visit.    Physical Exam Vitals reviewed.  Constitutional:      General: She is not in acute distress.    Appearance:  Normal appearance.  HENT:     Head: Normocephalic and atraumatic.  Eyes:     General: No scleral icterus. Cardiovascular:     Rate and Rhythm: Normal rate and regular rhythm.     Heart sounds: Normal heart sounds. No murmur heard.   Pulmonary:     Effort: Pulmonary effort is normal. No respiratory distress.     Breath sounds: No wheezing or rales.  Skin:    General: Skin is warm and dry.  Neurological:     General: No focal deficit present.     Mental Status: She is alert and oriented to person, place, and time.     Cranial Nerves: No cranial nerve deficit.     Motor: No weakness.     Diagnostic Tests: Pulmonary function testing 08/10/2020 FVC 1.69(66%) FEV1 1.34(70%) FEV1 1.47(77%) postbronchodilator DLCO 12.98(74%)  6 min walk test 864 feet, no  desaturation  NUCLEAR STRESS TEST Addendum by Sanda Klein, MD on Fri Aug 11, 2020 5:30 PM   The left ventricular ejection fraction is hyperdynamic (>65%).  This is a low risk study.  The study is normal.   Low risk stress nuclear study with normal perfusion and normal left ventricular regional and global systolic function.    Impression: Angela Phillips is a 74 yo woman with a past medical history significant for hypertension, hyperlipidemia, paroxysmal SVT, ablation, and asthma.  She has remote history of light tobacco use.  She smoked about 1/2 pack/day for approximately 15 years.  She quit 30 years ago. She now has bilateral lung nodules with a 2.4 cm mixed density nodule in the right lower lobe with a 5 mm solid component and a 2 cm pure ground glass nodule in the left upper lobe.  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   In regards to surgery, I think the right lower lobe nodule would likely require a lobectomy due to location. The LUL nodule is amenable to a wedge resection or segementectomy.  Her PFT would indicate she could tolerate resection(s) but her symptoms are significant now and would be worse if she has a resection, let alone bilateral resections.  Another option would be to biopsy the lesions and treat with SBRT. Given she has bilateral lesions, I think a navigational bronch for diagnosis and staging is appropriate prior to resection anyway, even if we ultimately decide to do surgery.  I recommended that we proceed with an electromagnetic navigational bronchoscopy for biopsy of both the right lower lobe and left upper lobe lesions.  I described the general nature of the procedure to Mr. and Mrs. Stave.  They understand this is a bronchoscopic procedure that will be done on an outpatient basis.  They understand the procedure will be done under general anesthesia and  carries risks associated with that.  I informed them of the indications, risks, benefits, and alternatives.  They understand the risks of the procedure specifically include bleeding, pneumothorax, and failure to make a diagnosis.  Also the general risks of death, MI, stroke, DVT, PE, as well as possibility of other unforeseeable complications.  She understands the risks and agrees to proceed.    Plan: Electromagnetic navigational bronchoscopy on 08/24/2020 Referral to radiation oncology, Dr. Orlene Erm in [place] to discuss potential radiation therapy.  Melrose Nakayama, MD [facility] [phone]

## 2020-08-15 NOTE — H&P (View-Only) (Signed)
[address]       [place] [address]             [phone]     HPI: Mrs. Angela Phillips returns to further discuss management of her bilateral lung nodules.  Angela Phillips is a 74 year old woman with a past medical history significant for hypertension, hyperlipidemia, paroxysmal SVT, ablation, and asthma.  She has remote history of light tobacco use.  She smoked about 1/2 pack/day for approximately 15 years.  She quit 30 years ago.  She recently was evaluated in the emergency room for shortness of breath and wheezing.  A chest x-ray showed possible lung nodules.  That was confirmed with CT scan in June 2021.  She had a 1.9 x 1.6 cm groundglass opacity in the left apex and a 2.1 x 1.8 cm subsolid nodule in the posterior right lower lobe.  There is no mediastinal or hilar adenopathy.  A PET/CT showed no significant uptake in either nodule.    She had a follow-up CT on 07/06/2020 at [facility] which showed a 2.4 cm nodule in the right lower lobe with a 5 mm solid component and a 2 cm subsolid nodule in the left apex.    I saw her on 07/25/2020. She complained of shortness of breath with even relatively minor exertion. Wears 3L  O2 at night. I felt her symptoms were out of proportion to her PFTs and referred her to Cardiology. She saw Dr. Sallyanne Phillips and had a nuclear stress test.   She now returns to further discuss management of her lung nodules.  Past Medical History:  Diagnosis Date  . Asthma   . Dyslipidemia (high LDL; low HDL) 08/13/2016  . HTN (hypertension) 11/20/2016  . Paroxysmal SVT (supraventricular tachycardia) ([place]) 07/05/2016    Current Outpatient Medications  Medication Sig Dispense Refill  . clonazePAM (KLONOPIN) 1 MG tablet Take 1 mg by mouth 3 (three) times daily as needed for anxiety.    Marland Kitchen FLUoxetine (PROZAC) 20 MG capsule Take 20 mg by mouth 2 (two) times daily.    Marland Kitchen gabapentin (NEURONTIN) 100 MG capsule Take 100 mg by mouth 3 (three) times daily.    Marland Kitchen  levalbuterol (XOPENEX) 1.25 MG/3ML nebulizer solution Take 1.25 mg by nebulization every 4 (four) hours as needed for wheezing or shortness of breath.     . losartan-hydrochlorothiazide (HYZAAR) 50-12.5 MG tablet Take 1 tablet by mouth daily.    . montelukast (SINGULAIR) 10 MG tablet Take 10 mg by mouth daily.     . Multiple Vitamin (MULTI-VITAMINS) TABS Take 1 tablet by mouth daily.     . nabumetone (RELAFEN) 750 MG tablet Take 750 mg by mouth 2 (two) times daily.    Marland Kitchen omeprazole (PRILOSEC) 20 MG capsule Take 20 mg by mouth 2 (two) times daily.     Marland Kitchen omeprazole (PRILOSEC) 40 MG capsule Take 40 mg by mouth daily.    . simvastatin (ZOCOR) 20 MG tablet Take 20 mg by mouth every Monday, Wednesday, and Friday.     . SYMBICORT 160-4.5 MCG/ACT inhaler Inhale into the lungs.    . diphenoxylate-atropine (LOMOTIL) 2.5-0.025 MG tablet Take 1 tablet by mouth every 6 (six) hours as needed for diarrhea or loose stools. (Patient not taking: Reported on 08/15/2020)     No current facility-administered medications for this visit.    Physical Exam Vitals reviewed.  Constitutional:      General: She is not in acute distress.    Appearance:  Normal appearance.  HENT:     Head: Normocephalic and atraumatic.  Eyes:     General: No scleral icterus. Cardiovascular:     Rate and Rhythm: Normal rate and regular rhythm.     Heart sounds: Normal heart sounds. No murmur heard.   Pulmonary:     Effort: Pulmonary effort is normal. No respiratory distress.     Breath sounds: No wheezing or rales.  Skin:    General: Skin is warm and dry.  Neurological:     General: No focal deficit present.     Mental Status: She is alert and oriented to person, place, and time.     Cranial Nerves: No cranial nerve deficit.     Motor: No weakness.     Diagnostic Tests: Pulmonary function testing 08/10/2020 FVC 1.69(66%) FEV1 1.34(70%) FEV1 1.47(77%) postbronchodilator DLCO 12.98(74%)  6 min walk test 864 feet, no  desaturation  NUCLEAR STRESS TEST Addendum by Sanda Klein, MD on Fri Aug 11, 2020 5:30 PM   The left ventricular ejection fraction is hyperdynamic (>65%).  This is a low risk study.  The study is normal.   Low risk stress nuclear study with normal perfusion and normal left ventricular regional and global systolic function.    Impression: Mrs. Angela Phillips is a 74 yo woman with a past medical history significant for hypertension, hyperlipidemia, paroxysmal SVT, ablation, and asthma.  She has remote history of light tobacco use.  She smoked about 1/2 pack/day for approximately 15 years.  She quit 30 years ago. She now has bilateral lung nodules with a 2.4 cm mixed density nodule in the right lower lobe with a 5 mm solid component and a 2 cm pure ground glass nodule in the left upper lobe.  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   In regards to surgery, I think the right lower lobe nodule would likely require a lobectomy due to location. The LUL nodule is amenable to a wedge resection or segementectomy.  Her PFT would indicate she could tolerate resection(s) but her symptoms are significant now and would be worse if she has a resection, let alone bilateral resections.  Another option would be to biopsy the lesions and treat with SBRT. Given she has bilateral lesions, I think a navigational bronch for diagnosis and staging is appropriate prior to resection anyway, even if we ultimately decide to do surgery.  I recommended that we proceed with an electromagnetic navigational bronchoscopy for biopsy of both the right lower lobe and left upper lobe lesions.  I described the general nature of the procedure to Angela Phillips.  They understand this is a bronchoscopic procedure that will be done on an outpatient basis.  They understand the procedure will be done under general anesthesia and  carries risks associated with that.  I informed them of the indications, risks, benefits, and alternatives.  They understand the risks of the procedure specifically include bleeding, pneumothorax, and failure to make a diagnosis.  Also the general risks of death, MI, stroke, DVT, PE, as well as possibility of other unforeseeable complications.  She understands the risks and agrees to proceed.    Plan: Electromagnetic navigational bronchoscopy on 08/24/2020 Referral to radiation oncology, Dr. Orlene Erm in [place] to discuss potential radiation therapy.  Melrose Nakayama, MD [facility] [phone]

## 2020-08-16 ENCOUNTER — Other Ambulatory Visit: Payer: Self-pay | Admitting: Thoracic Surgery (Cardiothoracic Vascular Surgery)

## 2020-08-16 DIAGNOSIS — R918 Other nonspecific abnormal finding of lung field: Secondary | ICD-10-CM

## 2020-08-21 NOTE — Progress Notes (Signed)
[id number] - [place], [place] [address]  [address] Phone: [phone] Fax: [phone]      Your procedure is scheduled on Thursday December 16  Report to [facility] Main Entrance "A" at 0600 A.M., and check in at the Admitting office.  Call this number if you have problems the morning of surgery:  [phone]  Call [phone] if you have any questions prior to your surgery date Monday-Friday 8am-4pm    Remember:  Do not eat or drink after midnight the night before your surgery     Take these medicines the morning of surgery with A SIP OF WATER  budesonide (PULMICORT) if needed clonazePAM (KLONOPIN) FLUoxetine (PROZAC)  levalbuterol (XOPENEX) neb if needed nabumetone (RELAFEN) omeprazole (PRILOSEC) SYMBICORT if needed, Please bring all inhalers with you the day of surgery.   As of today, STOP taking any Aspirin (unless otherwise instructed by your surgeon) Aleve, Naproxen, Ibuprofen, Motrin, Advil, Goody's, BC's, all herbal medications, fish oil, and all vitamins.                      Do not wear jewelry, make up, or nail polish            Do not wear lotions, powders, perfumes/colognes, or deodorant.            Do not shave 48 hours prior to surgery.  Men may shave face and neck.            Do not bring valuables to the hospital.            [facility] is not responsible for any belongings or valuables.  Do NOT Smoke (Tobacco/Vaping) or drink Alcohol 24 hours prior to your procedure If you use a CPAP at night, you may bring all equipment for your overnight stay.   Contacts, glasses, dentures or bridgework may not be worn into surgery.      For patients admitted to the hospital, discharge time will be determined by your treatment team.   Patients discharged the day of surgery will not be allowed to drive home, and someone needs to stay with them for 24 hours.    Special instructions:   [facility]- Preparing For Surgery  Before surgery, you can play an important role. Because skin is not sterile, your skin needs to be as free of germs as possible. You can reduce the number of germs on your skin by washing with CHG (chlorahexidine gluconate) Soap before surgery.  CHG is an antiseptic cleaner which kills germs and bonds with the skin to continue killing germs even after washing.    Oral Hygiene is also important to reduce your risk of infection.  Remember - BRUSH YOUR TEETH THE MORNING OF SURGERY WITH YOUR REGULAR TOOTHPASTE  Please do not use if you have an allergy to CHG or antibacterial soaps. If your skin becomes reddened/irritated stop using the CHG.  Do not shave (including legs and underarms) for at least 48 hours prior to first CHG shower. It is OK to shave your face.  Please follow these instructions carefully.   1. Shower the NIGHT BEFORE SURGERY and the MORNING OF SURGERY with CHG Soap.   2. If you chose to wash your hair, wash your hair first as usual with your normal shampoo.  3. After you shampoo, rinse your hair and body thoroughly to remove the shampoo.  4. Use CHG as  you would any other liquid soap. You can apply CHG directly to the skin and wash gently with a scrungie or a clean washcloth.   5. Apply the CHG Soap to your body ONLY FROM THE NECK DOWN.  Do not use on open wounds or open sores. Avoid contact with your eyes, ears, mouth and genitals (private parts). Wash Face and genitals (private parts)  with your normal soap.   6. Wash thoroughly, paying special attention to the area where your surgery will be performed.  7. Thoroughly rinse your body with warm water from the neck down.  8. DO NOT shower/wash with your normal soap after using and rinsing off the CHG Soap.  9. Pat yourself dry with a CLEAN TOWEL.  10. Wear CLEAN PAJAMAS to bed the night before surgery  11. Place CLEAN SHEETS on your bed the night of your first shower and DO NOT SLEEP WITH  PETS.   Day of Surgery: Wear Clean/Comfortable clothing the morning of surgery Do not apply any deodorants/lotions.   Remember to brush your teeth WITH YOUR REGULAR TOOTHPASTE.   Please read over the following fact sheets that you were given.

## 2020-08-22 ENCOUNTER — Other Ambulatory Visit (HOSPITAL_COMMUNITY): Payer: Medicare HMO

## 2020-08-22 ENCOUNTER — Ambulatory Visit
Admission: RE | Admit: 2020-08-22 | Discharge: 2020-08-22 | Disposition: A | Discharge status: Home / Self Care | Payer: Medicare HMO | Source: Ambulatory Visit | Attending: Thoracic Surgery (Cardiothoracic Vascular Surgery) | Admitting: Thoracic Surgery (Cardiothoracic Vascular Surgery)

## 2020-08-22 ENCOUNTER — Inpatient Hospital Stay (HOSPITAL_COMMUNITY)
Admission: RE | Admit: 2020-08-22 | Discharge: 2020-08-22 | Disposition: A | Discharge status: Home / Self Care | Payer: Medicare HMO | Source: Ambulatory Visit

## 2020-08-22 DIAGNOSIS — I2510 Atherosclerotic heart disease of native coronary artery without angina pectoris: Secondary | ICD-10-CM | POA: Diagnosis not present

## 2020-08-22 DIAGNOSIS — J439 Emphysema, unspecified: Secondary | ICD-10-CM | POA: Diagnosis not present

## 2020-08-22 DIAGNOSIS — R918 Other nonspecific abnormal finding of lung field: Secondary | ICD-10-CM

## 2020-08-22 DIAGNOSIS — K449 Diaphragmatic hernia without obstruction or gangrene: Secondary | ICD-10-CM | POA: Diagnosis not present

## 2020-08-22 DIAGNOSIS — J8410 Pulmonary fibrosis, unspecified: Secondary | ICD-10-CM | POA: Diagnosis not present

## 2020-08-22 IMAGING — CT CT ABD-PEL WO/W CM
4 of 12 series · 12 of 46 positions shown, 18 images · IV contrast (APPLIED)
Comparison: None.

CLINICAL DATA: Gross hematuria, UTIs, back pain, vomiting

EXAM:
CT ABDOMEN AND PELVIS WITHOUT AND WITH CONTRAST
TECHNIQUE: Multidetector CT imaging of the abdomen and pelvis was performed
following the standard protocol before and following the bolus
administration of intravenous contrast.
CONTRAST:  100mL OMNIPAQUE IOHEXOL 300 MG/ML  SOLN

[Series 2: axial pre · axial · non-contrast · 0.73mm/px · z∈[-381,-126]mm · 4 of 85 slices shown]
[im 17/85  soft-tissue]
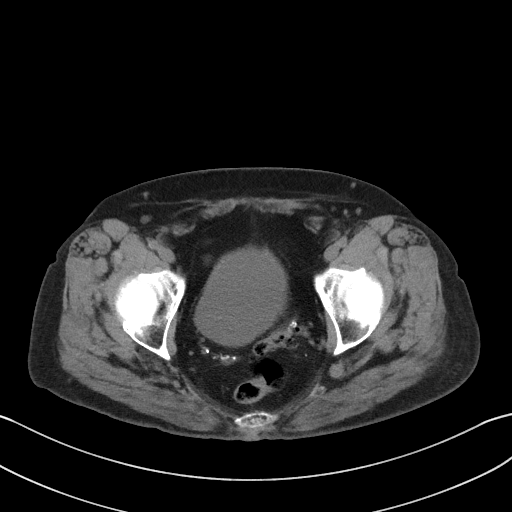
[im 34/85  soft-tissue]
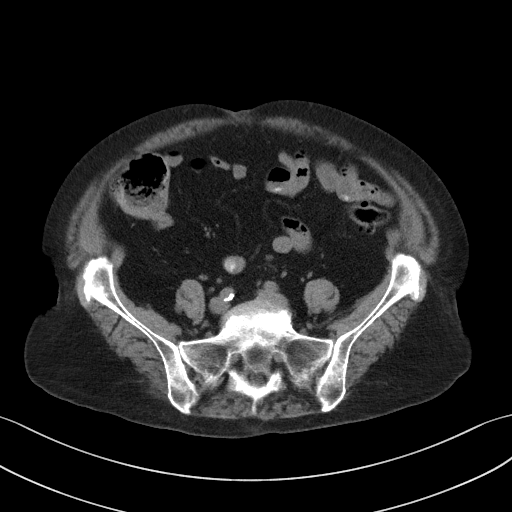
[im 51/85  soft-tissue]
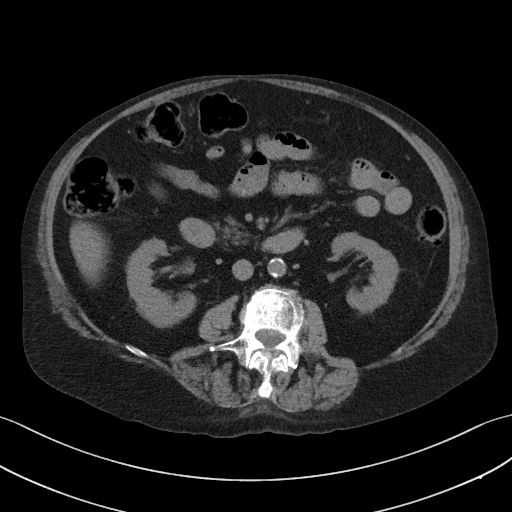
[im 68/85  soft-tissue]
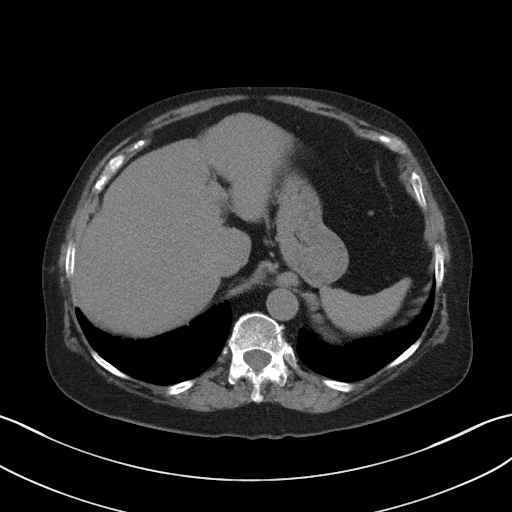

[Series 4: coronal pre · coronal · non-contrast · 0.71mm/px · 2 of 102 slices shown, 3 images]
[im 34/102  soft-tissue]
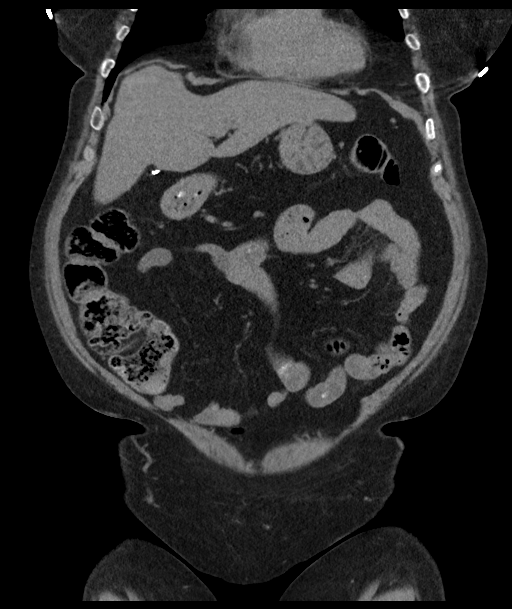
[im 34/102  bone]
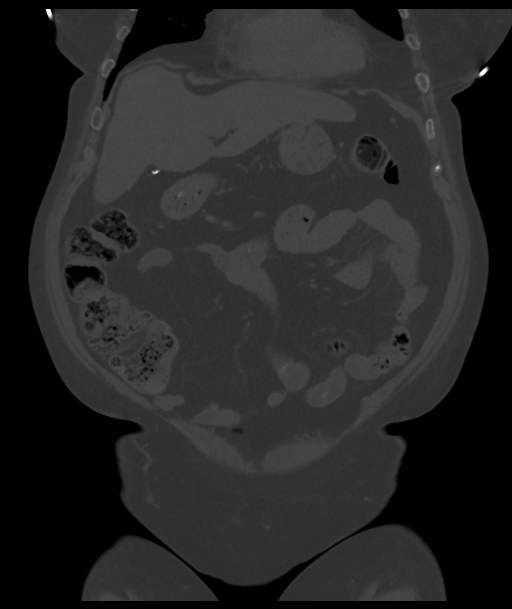
[im 68/102  soft-tissue]
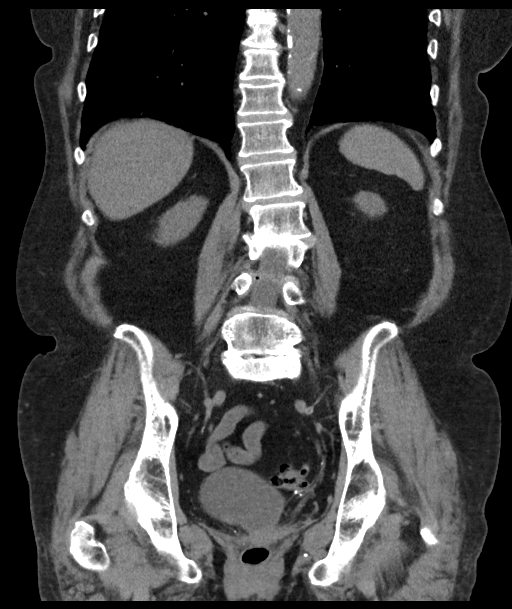

[Series 7: axial post · axial · 0.73mm/px · z∈[-381,-296]mm · 2 of 85 slices shown]
[im 17/85  soft-tissue]
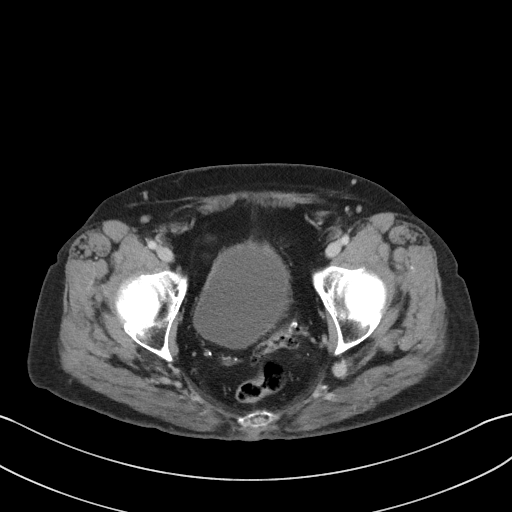
[im 34/85  soft-tissue]
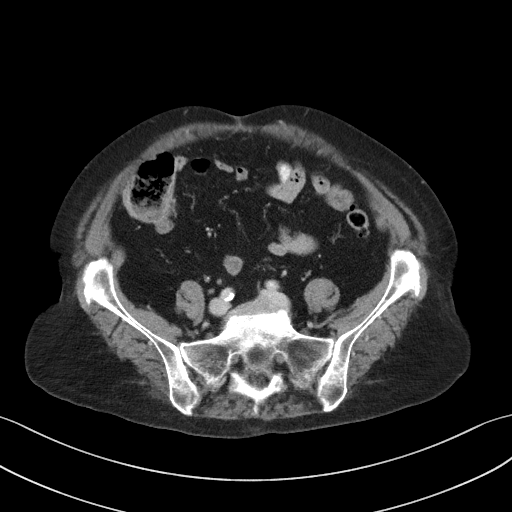

[Series 13: axial delay · axial · delayed · 0.80mm/px · z∈[-348,-92]mm · 4 of 87 slices shown, 9 images]
[im 18/87  soft-tissue]
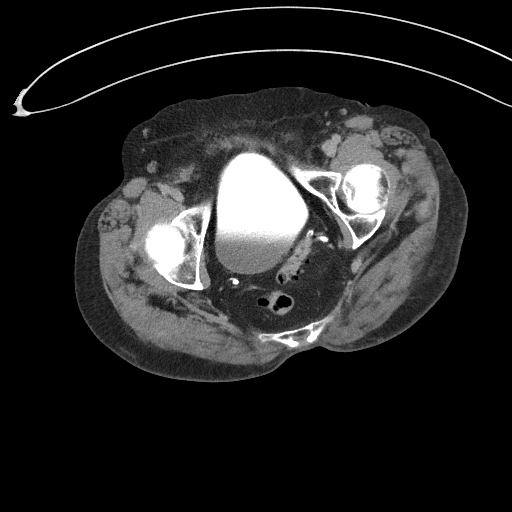
[im 18/87  lung]
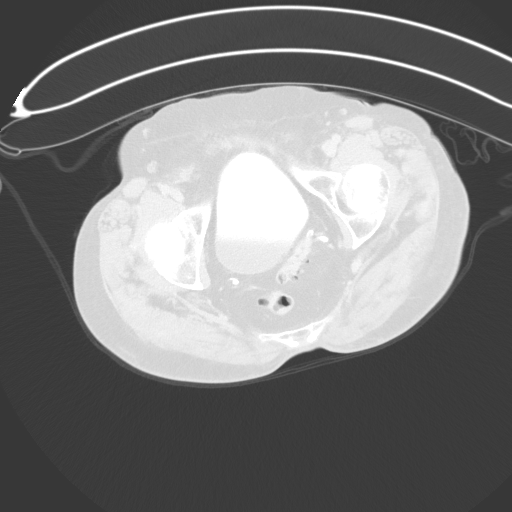
[im 18/87  bone]
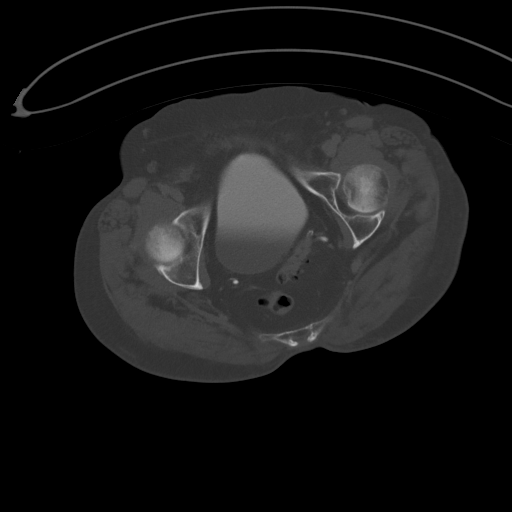
[im 35/87  soft-tissue]
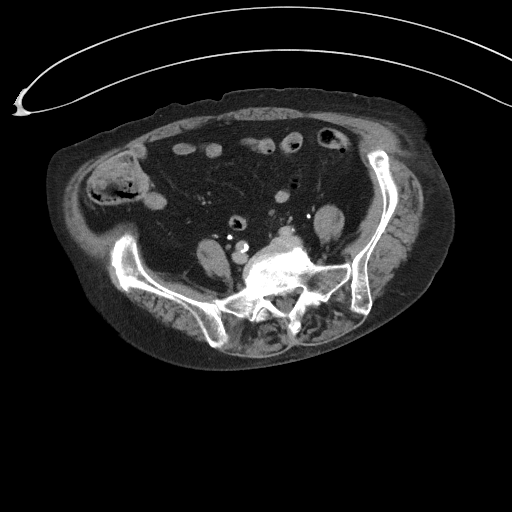
[im 35/87  lung]
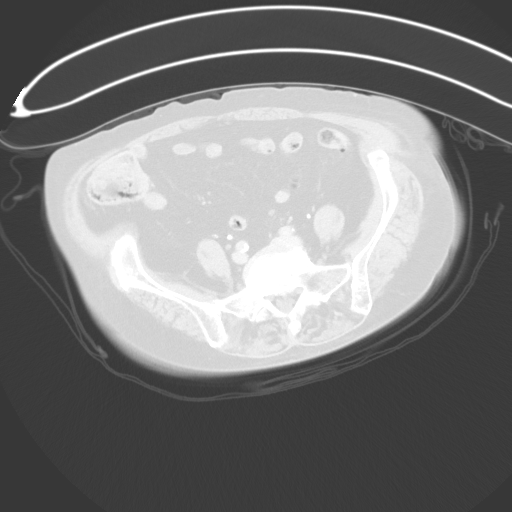
[im 52/87  soft-tissue]
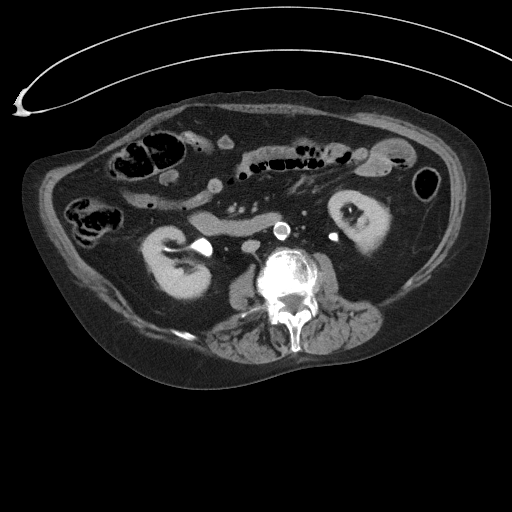
[im 52/87  lung]
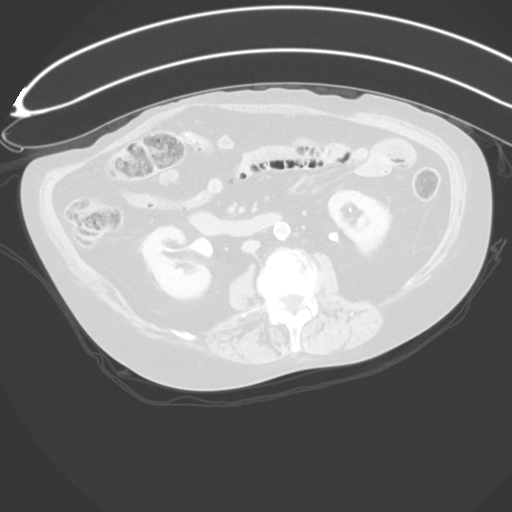
[im 69/87  soft-tissue]
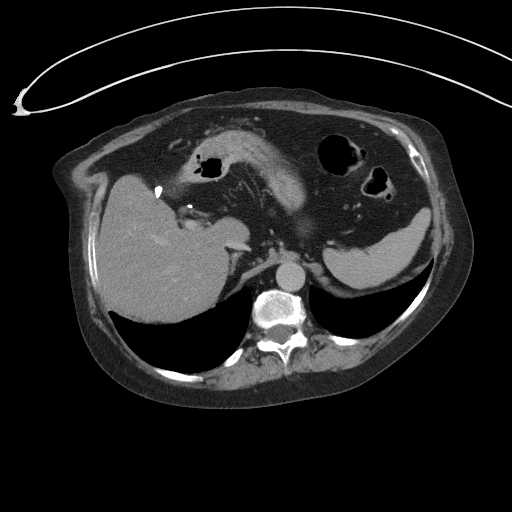
[im 69/87  lung]
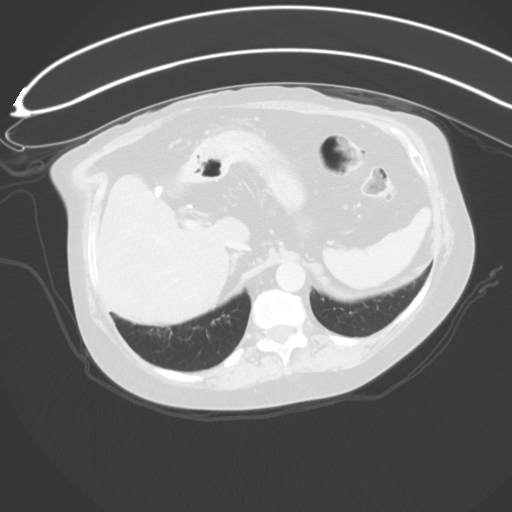

[12 of 46 positions shown; findings below may reference images not displayed]

FINDINGS: Lower chest: No acute abnormality.

Hepatobiliary: No focal liver abnormality is seen. Status post
cholecystectomy. No biliary dilatation.

Pancreas: Unremarkable. No pancreatic ductal dilatation or
surrounding inflammatory changes.

Spleen: Normal in size without significant abnormality.

Adrenals/Urinary Tract: Adrenal glands are unremarkable. Kidneys are
normal, without renal calculi, solid lesion, or hydronephrosis.
Bladder is unremarkable.

Stomach/Bowel: Stomach is within normal limits. Appendix appears
normal. No evidence of bowel wall thickening, distention, or
inflammatory changes. Severe descending and sigmoid diverticulosis.

Vascular/Lymphatic: Aortic atherosclerosis. No enlarged abdominal or
pelvic lymph nodes.

Reproductive: Status post hysterectomy.

Other: No abdominal wall hernia or abnormality. No abdominopelvic
ascites.

Musculoskeletal: No acute or significant osseous findings.
IMPRESSION: 1. No evidence of urinary calculus, hydronephrosis, or urinary tract
filling defect to explain gross hematuria.

2.  Diverticulosis without evidence of acute diverticulitis.

3.  Status post cholecystectomy and hysterectomy.

4.  Aortic Atherosclerosis ([id number]-H70.0).

## 2020-08-23 ENCOUNTER — Ambulatory Visit (HOSPITAL_COMMUNITY)
Admission: RE | Admit: 2020-08-23 | Discharge: 2020-08-23 | Disposition: A | Discharge status: Home / Self Care | Payer: Medicare HMO | Source: Ambulatory Visit | Attending: Thoracic Surgery (Cardiothoracic Vascular Surgery) | Admitting: Thoracic Surgery (Cardiothoracic Vascular Surgery)

## 2020-08-23 ENCOUNTER — Telehealth: Payer: Self-pay | Admitting: *Deleted

## 2020-08-23 ENCOUNTER — Encounter (HOSPITAL_COMMUNITY)
Admission: RE | Admit: 2020-08-23 | Discharge: 2020-08-23 | Disposition: A | Discharge status: Home / Self Care | Payer: Medicare HMO | Source: Ambulatory Visit | Attending: Thoracic Surgery (Cardiothoracic Vascular Surgery) | Admitting: Thoracic Surgery (Cardiothoracic Vascular Surgery)

## 2020-08-23 ENCOUNTER — Encounter (HOSPITAL_COMMUNITY): Payer: Self-pay

## 2020-08-23 ENCOUNTER — Other Ambulatory Visit: Payer: Self-pay

## 2020-08-23 ENCOUNTER — Other Ambulatory Visit (HOSPITAL_COMMUNITY): Payer: Medicare HMO

## 2020-08-23 DIAGNOSIS — I700 Atherosclerosis of aorta: Secondary | ICD-10-CM | POA: Diagnosis not present

## 2020-08-23 DIAGNOSIS — R918 Other nonspecific abnormal finding of lung field: Secondary | ICD-10-CM | POA: Diagnosis present

## 2020-08-23 DIAGNOSIS — Z01818 Encounter for other preprocedural examination: Secondary | ICD-10-CM

## 2020-08-23 DIAGNOSIS — J449 Chronic obstructive pulmonary disease, unspecified: Secondary | ICD-10-CM | POA: Diagnosis not present

## 2020-08-23 DIAGNOSIS — Z20822 Contact with and (suspected) exposure to covid-19: Secondary | ICD-10-CM | POA: Diagnosis not present

## 2020-08-23 DIAGNOSIS — R911 Solitary pulmonary nodule: Secondary | ICD-10-CM | POA: Diagnosis not present

## 2020-08-23 LAB — TYPE AND SCREEN
ABO/RH(D): O POS
Antibody Screen: NEGATIVE

## 2020-08-23 LAB — COMPREHENSIVE METABOLIC PANEL
ALT: 26 U/L (ref 0–44)
AST: 29 U/L (ref 15–41)
Albumin: 3.3 g/dL — ABNORMAL LOW (ref 3.5–5.0)
Alkaline Phosphatase: 61 U/L (ref 38–126)
Anion gap: 9 (ref 5–15)
BUN: 9 mg/dL (ref 8–23)
CO2: 29 mmol/L (ref 22–32)
Calcium: 9.5 mg/dL (ref 8.9–10.3)
Chloride: 101 mmol/L (ref 98–111)
Creatinine, Ser: 0.82 mg/dL (ref 0.44–1.00)
GFR, Estimated: >60 mL/min (ref >60)
Glucose, Bld: 106 mg/dL — ABNORMAL HIGH (ref 70–99)
Potassium: 3.5 mmol/L (ref 3.5–5.1)
Sodium: 139 mmol/L (ref 135–145)
Total Bilirubin: 0.8 mg/dL (ref 0.3–1.2)
Total Protein: 6.5 g/dL (ref 6.5–8.1)

## 2020-08-23 LAB — CBC
HCT: 47.1 % — ABNORMAL HIGH (ref 36.0–46.0)
Hemoglobin: 15.4 g/dL — ABNORMAL HIGH (ref 12.0–15.0)
MCH: 32 pg (ref 26.0–34.0)
MCHC: 32.7 g/dL (ref 30.0–36.0)
MCV: 97.9 fL (ref 80.0–100.0)
Platelets: 272 10*3/uL (ref 150–400)
RBC: 4.81 MIL/uL (ref 3.87–5.11)
RDW: 13.6 % (ref 11.5–15.5)
WBC: 8.7 10*3/uL (ref 4.0–10.5)
nRBC: 0 % (ref 0.0–0.2)

## 2020-08-23 LAB — PROTIME-INR
INR: 1 (ref 0.8–1.2)
Prothrombin Time: 12.6 seconds (ref 11.4–15.2)

## 2020-08-23 LAB — SARS CORONAVIRUS 2 (TAT 6-24 HRS): SARS Coronavirus 2: NEGATIVE

## 2020-08-23 LAB — APTT: aPTT: 26 seconds (ref 24–36)

## 2020-08-23 NOTE — Progress Notes (Signed)
PCP - Laverna Peace, NP Cardiologist - Dr. Sallyanne Kuster  Chest x-ray - 08/23/20 EKG - 08/02/20 Stress Test - 08/11/20 ECHO - denies Cardiac Cath - listed under patient surgeries- patient denies ever having cardiac cath?  Sleep Study - denies  Aspirin Instructions: Patient instructed to hold all Aspirin, NSAID's, herbal medications, fish oil and vitamins as of today   Anesthesia review: cardiac history  Patient denies shortness of breath, fever, cough and chest pain at PAT appointment   Patient verbalized understanding of instructions that were given to them at the PAT appointment. Patient was also instructed that they will need to review over the PAT instructions again at home before surgery.

## 2020-08-23 NOTE — Progress Notes (Signed)
Patient tested for covid at PAT appointment. Covid test taken to lab and placed in covid specimen bin at 1030 am.

## 2020-08-23 NOTE — Anesthesia Preprocedure Evaluation (Addendum)
Anesthesia Evaluation  Patient identified by MRN, date of birth, ID band Patient awake    Reviewed: Allergy & Precautions, NPO status , Patient's Chart, lab work & pertinent test results  History of Anesthesia Complications Negative for: history of anesthetic complications  Airway Mallampati: II  TM Distance: >3 FB Neck ROM: Full    Dental  (+) Partial Upper, Dental Advisory Given   Pulmonary asthma , former smoker,     + wheezing      Cardiovascular hypertension, Normal cardiovascular exam  She had recent preoperative cardiology evaluation by Dr. Sallyanne Kuster with low risk stress test. S/P ablation   Neuro/Psych negative neurological ROS     GI/Hepatic negative GI ROS, Neg liver ROS,   Endo/Other  negative endocrine ROS  Renal/GU negative Renal ROS     Musculoskeletal negative musculoskeletal ROS (+)   Abdominal   Peds  Hematology negative hematology ROS (+)   Anesthesia Other Findings   Reproductive/Obstetrics                            Anesthesia Physical Anesthesia Plan  ASA: III  Anesthesia Plan: General   Post-op Pain Management:    Induction: Intravenous  PONV Risk Score and Plan: 4 or greater and Ondansetron, Dexamethasone, Diphenhydramine and Treatment may vary due to age or medical condition  Airway Management Planned: Oral ETT  Additional Equipment:   Intra-op Plan:   Post-operative Plan: Extubation in OR  Informed Consent: I have reviewed the patients History and Physical, chart, labs and discussed the procedure including the risks, benefits and alternatives for the proposed anesthesia with the patient or authorized representative who has indicated his/her understanding and acceptance.     Dental advisory given  Plan Discussed with: Anesthesiologist and CRNA  Anesthesia Plan Comments: (PAT note written 08/23/2020 by Myra Gianotti, PA-C. )      Anesthesia  Quick Evaluation

## 2020-08-23 NOTE — Progress Notes (Signed)
Anesthesia Chart Review:  Case: [id number] Date/Time: 08/24/20 0745   Procedure: VIDEO BRONCHOSCOPY WITH ENDOBRONCHIAL NAVIGATION (N/A )   Anesthesia type: General   Pre-op diagnosis: BILATERAL LUNG NODULES   Location: [facility] OR ROOM 17 / [place] OR   Surgeons: Melrose Nakayama, MD      DISCUSSION: Patient is a 74 year old female scheduled for the above procedure.  She has bilateral lung nodules.  History includes former smoker (quit 30 years ago), HTN, dyslipidemia, asthma, PSVT (s/p ablation 10/19/18).  TCTS note indicates she wears 3L O2/[place] at night.  She had recent preoperative cardiology evaluation by Dr. Sallyanne Kuster with low risk stress test.  08/23/2020 presurgical COVID-19 test negative. Preoperative CXR is in process.  Anesthesia team to evaluate on the day of surgery.   VS: BP (!) 144/76   Pulse 86   Temp 36.6 C (Oral)   Resp 18   Ht 5\' 1"  (1.549 m)   Wt 55.7 kg   SpO2 96%   BMI 23.18 kg/m   PROVIDERS: Lowella Dandy, NP Croitoru, Mihai, MD is cardiologist Cristopher Peru, MD is EP cardiologist   LABS: Labs reviewed: Acceptable for surgery. (all labs ordered are listed, but only abnormal results are displayed)  Labs Reviewed  COMPREHENSIVE METABOLIC PANEL - Abnormal; Notable for the following components:      Result Value   Glucose, Bld 106 (*)    Albumin 3.3 (*)    All other components within normal limits  CBC - Abnormal; Notable for the following components:   Hemoglobin 15.4 (*)    HCT 47.1 (*)    All other components within normal limits  SARS CORONAVIRUS 2 (TAT 6-24 HRS)  PROTIME-INR  APTT  TYPE AND SCREEN    6 Minute Walk Test 07/25/20: Supplemental O2 during test?            NO                                                                                 Baseline                                 End Time    1525                                                                 1531 Heartrate                     79                                             89 Dyspnea                      no  no Fatigue                        no                                            yes O2 sat                          91%                                         97% Blood pressure            172/75                                     166/80 - Patient ambulated at a steady pace for a total distance of 864 feet with no stops. - Ambulation was limited primarily due to right knee pain - Overall the test was tolerated well   PFTs 08/10/20: FVC 1.69 (66%), post 1.86 (73%). FEV1 1.34 (70%), post 1.47 (77%). DLCO unc 12.98 (74%).   IMAGES: CXR 08/23/20: In process.  CT Super D Chest 08/22/20: IMPRESSION: 1. Stable 19 x 16.5 mm left apical ground-glass nodule. 2. Slight interval increase in size of the right lower lobe ground-glass nodule with slight increase in the more solid component medially. 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 3. No mediastinal or hilar mass or adenopathy. 4. Stable small scattered pulmonary nodules. 5. Stable advanced atherosclerotic calcifications involving the aorta and branch vessels including the coronary arteries.   EKG: 08/02/20: Sinus rhythm with premature atrial contractions Right bundle branch block   CV: Nuclear stress test 08/11/20:   The left ventricular ejection fraction is hyperdynamic (>65%).  This is a low risk study.  The study is normal.   Low risk stress nuclear study with normal perfusion and normal left ventricular regional and global systolic function.   EP Study/Ablation 10/19/18: CONCLUSIONS:  1. Sinus rhythm upon presentation.  2. 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 Successful radiofrequency modification of the slow AV nodal pathway  4. No inducible arrhythmias following ablation.  5. No early apparent  complications.   According to 09/30/18 note by Dr. Sallyanne Kuster, "She tells me that she saw Dr. Geraldo Pitter in [place] maybe 15 years ago and had a cardiac catheterization that did not show any blockages."    Echo 07/17/16: Based on 08/13/16 by Jenne Campus, MD [facility]), "she have echocardiogram which showed preserved left ventricle ejection fraction."   Past Medical History:  Diagnosis Date  . Asthma   . Dyslipidemia (high LDL; low HDL) 08/13/2016  . HTN (hypertension) 11/20/2016  . Paroxysmal SVT (supraventricular tachycardia) ([place]) 07/05/2016    Past Surgical History:  Procedure Laterality Date  . ABDOMINAL HYSTERECTOMY    . CARDIAC CATHETERIZATION    . CARPAL TUNNEL RELEASE    . CATARACT EXTRACTION    . CHOLECYSTECTOMY    . HEMORROIDECTOMY    . JOINT REPLACEMENT     R knee  . SVT ABLATION N/A  10/19/2018   Procedure: SVT ABLATION;  Surgeon: Evans Lance, MD;  Location: [facility];  Service: Cardiovascular;  Laterality: N/A;  . TOE SURGERY      MEDICATIONS: . budesonide (PULMICORT) 0.5 MG/2ML nebulizer solution  . clonazePAM (KLONOPIN) 1 MG tablet  . diphenoxylate-atropine (LOMOTIL) 2.5-0.025 MG tablet  . FLUoxetine (PROZAC) 20 MG capsule  . gabapentin (NEURONTIN) 300 MG capsule  . levalbuterol (XOPENEX) 1.25 MG/3ML nebulizer solution  . losartan-hydrochlorothiazide (HYZAAR) 50-12.5 MG tablet  . Multiple Vitamin (MULTI-VITAMINS) TABS  . nabumetone (RELAFEN) 750 MG tablet  . nitrofurantoin (MACRODANTIN) 100 MG capsule  . omeprazole (PRILOSEC) 40 MG capsule  . simvastatin (ZOCOR) 40 MG tablet  . SYMBICORT 160-4.5 MCG/ACT inhaler   No current facility-administered medications for this encounter.    Myra Gianotti, PA-C Surgical Short Stay/Anesthesiology [facility] Phone [phone] [facility] Phone [phone] 08/23/2020 3:23 PM

## 2020-08-23 NOTE — Telephone Encounter (Signed)
I received a call from Dr. Donnal Debar at [facility].  He would like Ms. Hadsall to be seen with rad onc here to be evaluated for sbrt.  I called Ms. Cornwall to discuss but was unable to reach. I did leave vm message with my name and phone number to call.

## 2020-08-23 NOTE — Telephone Encounter (Signed)
Dr. Orlene Erm left a vm stating he received Dr. Leonarda Salon referral and thinks Ms. Avants will be better served by the physicians at [facility]. Message relayed to Dr. Roxan Hockey.

## 2020-08-24 ENCOUNTER — Ambulatory Visit (HOSPITAL_COMMUNITY): Payer: Medicare HMO

## 2020-08-24 ENCOUNTER — Encounter (HOSPITAL_COMMUNITY)
Admission: RE | Disposition: A | Discharge status: Home / Self Care | Payer: Self-pay | Source: Home / Self Care | Attending: Thoracic Surgery (Cardiothoracic Vascular Surgery)

## 2020-08-24 ENCOUNTER — Observation Stay (HOSPITAL_COMMUNITY): Payer: Medicare HMO

## 2020-08-24 ENCOUNTER — Observation Stay (HOSPITAL_COMMUNITY)
Admission: RE | Admit: 2020-08-24 | Discharge: 2020-08-25 | Disposition: A | Discharge status: Home / Self Care | Payer: Medicare HMO | Attending: Thoracic Surgery (Cardiothoracic Vascular Surgery) | Admitting: Thoracic Surgery (Cardiothoracic Vascular Surgery)

## 2020-08-24 ENCOUNTER — Encounter (HOSPITAL_COMMUNITY): Payer: Self-pay | Admitting: Thoracic Surgery (Cardiothoracic Vascular Surgery)

## 2020-08-24 ENCOUNTER — Other Ambulatory Visit: Payer: Self-pay

## 2020-08-24 ENCOUNTER — Ambulatory Visit (HOSPITAL_COMMUNITY): Payer: Medicare HMO | Admitting: Anesthesiology

## 2020-08-24 ENCOUNTER — Encounter: Payer: Self-pay | Admitting: *Deleted

## 2020-08-24 ENCOUNTER — Ambulatory Visit (HOSPITAL_COMMUNITY): Payer: Medicare HMO | Admitting: Vascular Surgery

## 2020-08-24 DIAGNOSIS — R918 Other nonspecific abnormal finding of lung field: Secondary | ICD-10-CM

## 2020-08-24 DIAGNOSIS — R911 Solitary pulmonary nodule: Principal | ICD-10-CM | POA: Insufficient documentation

## 2020-08-24 DIAGNOSIS — Z87891 Personal history of nicotine dependence: Secondary | ICD-10-CM | POA: Diagnosis not present

## 2020-08-24 DIAGNOSIS — I700 Atherosclerosis of aorta: Secondary | ICD-10-CM | POA: Diagnosis not present

## 2020-08-24 DIAGNOSIS — J45909 Unspecified asthma, uncomplicated: Secondary | ICD-10-CM | POA: Insufficient documentation

## 2020-08-24 DIAGNOSIS — R0602 Shortness of breath: Secondary | ICD-10-CM | POA: Diagnosis not present

## 2020-08-24 DIAGNOSIS — R846 Abnormal cytological findings in specimens from respiratory organs and thorax: Secondary | ICD-10-CM | POA: Diagnosis not present

## 2020-08-24 DIAGNOSIS — J95811 Postprocedural pneumothorax: Secondary | ICD-10-CM | POA: Diagnosis not present

## 2020-08-24 DIAGNOSIS — I10 Essential (primary) hypertension: Secondary | ICD-10-CM | POA: Diagnosis not present

## 2020-08-24 DIAGNOSIS — J939 Pneumothorax, unspecified: Secondary | ICD-10-CM | POA: Diagnosis present

## 2020-08-24 DIAGNOSIS — Z9889 Other specified postprocedural states: Secondary | ICD-10-CM

## 2020-08-24 DIAGNOSIS — Z79899 Other long term (current) drug therapy: Secondary | ICD-10-CM | POA: Diagnosis not present

## 2020-08-24 DIAGNOSIS — E785 Hyperlipidemia, unspecified: Secondary | ICD-10-CM | POA: Diagnosis not present

## 2020-08-24 DIAGNOSIS — Z419 Encounter for procedure for purposes other than remedying health state, unspecified: Secondary | ICD-10-CM

## 2020-08-24 HISTORY — PX: VIDEO BRONCHOSCOPY WITH ENDOBRONCHIAL NAVIGATION: SHX6175

## 2020-08-24 HISTORY — DX: Other specified postprocedural states: Z98.890

## 2020-08-24 HISTORY — DX: Pneumothorax, unspecified: J93.9

## 2020-08-24 LAB — ABO/RH: ABO/RH(D): O POS

## 2020-08-24 SURGERY — VIDEO BRONCHOSCOPY WITH ENDOBRONCHIAL NAVIGATION
Anesthesia: General

## 2020-08-24 MED ORDER — CLONAZEPAM 0.5 MG PO TABS
1.0000 mg | ORAL_TABLET | Freq: Three times a day (TID) | ORAL | Status: DC | PRN
  Administered 2020-08-24 – 2020-08-25 (×2): 1 mg via ORAL
  Filled 2020-08-24 (×2): qty 2

## 2020-08-24 MED ORDER — FENTANYL CITRATE (PF) 250 MCG/5ML IJ SOLN
INTRAMUSCULAR | Status: DC | PRN
  Administered 2020-08-24: 100 ug via INTRAVENOUS

## 2020-08-24 MED ORDER — LIDOCAINE 2% (20 MG/ML) 5 ML SYRINGE
INTRAMUSCULAR | Status: DC | PRN
  Administered 2020-08-24: 100 mg via INTRAVENOUS

## 2020-08-24 MED ORDER — ROCURONIUM BROMIDE 10 MG/ML (PF) SYRINGE
PREFILLED_SYRINGE | INTRAVENOUS | Status: DC | PRN
  Administered 2020-08-24: 70 mg via INTRAVENOUS
  Administered 2020-08-24: 30 mg via INTRAVENOUS

## 2020-08-24 MED ORDER — MOMETASONE FURO-FORMOTEROL FUM 200-5 MCG/ACT IN AERO
2.0000 | INHALATION_SPRAY | Freq: Two times a day (BID) | RESPIRATORY_TRACT | Status: DC
  Administered 2020-08-24 – 2020-08-25 (×3): 2 via RESPIRATORY_TRACT
  Filled 2020-08-24: qty 8.8

## 2020-08-24 MED ORDER — PHENYLEPHRINE HCL (PRESSORS) 10 MG/ML IV SOLN
INTRAVENOUS | Status: DC | PRN
  Administered 2020-08-24 (×2): 120 ug via INTRAVENOUS

## 2020-08-24 MED ORDER — GUAIFENESIN-DM 100-10 MG/5ML PO SYRP
5.0000 mL | ORAL_SOLUTION | ORAL | Status: DC | PRN
  Administered 2020-08-24: 5 mL via ORAL
  Filled 2020-08-24: qty 5

## 2020-08-24 MED ORDER — LOSARTAN POTASSIUM-HCTZ 50-12.5 MG PO TABS: 1.0000 | ORAL_TABLET | Freq: Every day | ORAL | Status: DC

## 2020-08-24 MED ORDER — HYDROCHLOROTHIAZIDE 12.5 MG PO CAPS
12.5000 mg | ORAL_CAPSULE | Freq: Every day | ORAL | Status: DC
Start: 2020-08-24 — End: 2020-08-25
  Administered 2020-08-24 – 2020-08-25 (×2): 12.5 mg via ORAL
  Filled 2020-08-24 (×2): qty 1

## 2020-08-24 MED ORDER — FENTANYL CITRATE (PF) 100 MCG/2ML IJ SOLN: 25.0000 ug | INTRAMUSCULAR | Status: DC | PRN

## 2020-08-24 MED ORDER — LACTATED RINGERS IV SOLN: INTRAVENOUS | Status: DC

## 2020-08-24 MED ORDER — BUDESONIDE 0.5 MG/2ML IN SUSP: 0.5000 mg | Freq: Two times a day (BID) | RESPIRATORY_TRACT | Status: DC

## 2020-08-24 MED ORDER — ORAL CARE MOUTH RINSE: 15.0000 mL | Freq: Once | OROMUCOSAL | Status: AC

## 2020-08-24 MED ORDER — PROMETHAZINE HCL 25 MG/ML IJ SOLN: 6.2500 mg | INTRAMUSCULAR | Status: DC | PRN

## 2020-08-24 MED ORDER — PANTOPRAZOLE SODIUM 40 MG PO TBEC
40.0000 mg | DELAYED_RELEASE_TABLET | Freq: Every day | ORAL | Status: DC
  Administered 2020-08-24 – 2020-08-25 (×2): 40 mg via ORAL
  Filled 2020-08-24 (×2): qty 1

## 2020-08-24 MED ORDER — EPINEPHRINE PF 1 MG/ML IJ SOLN
INTRAMUSCULAR | Status: DC | PRN
  Administered 2020-08-24: 1 mg via ENDOTRACHEOPULMONARY

## 2020-08-24 MED ORDER — ONDANSETRON HCL 4 MG/2ML IJ SOLN
INTRAMUSCULAR | Status: DC | PRN
  Administered 2020-08-24: 4 mg via INTRAVENOUS

## 2020-08-24 MED ORDER — PHENYLEPHRINE 40 MCG/ML (10ML) SYRINGE FOR IV PUSH (FOR BLOOD PRESSURE SUPPORT)
PREFILLED_SYRINGE | INTRAVENOUS | Status: AC
  Filled 2020-08-24: qty 10

## 2020-08-24 MED ORDER — LIDOCAINE 2% (20 MG/ML) 5 ML SYRINGE
INTRAMUSCULAR | Status: AC
  Filled 2020-08-24: qty 5

## 2020-08-24 MED ORDER — ACETAMINOPHEN 325 MG PO TABS
650.0000 mg | ORAL_TABLET | Freq: Four times a day (QID) | ORAL | Status: DC | PRN
  Administered 2020-08-24 – 2020-08-25 (×3): 650 mg via ORAL
  Filled 2020-08-24 (×3): qty 2

## 2020-08-24 MED ORDER — LOSARTAN POTASSIUM 50 MG PO TABS
50.0000 mg | ORAL_TABLET | Freq: Every day | ORAL | Status: DC
  Administered 2020-08-24 – 2020-08-25 (×2): 50 mg via ORAL
  Filled 2020-08-24 (×2): qty 1

## 2020-08-24 MED ORDER — PHENYLEPHRINE HCL-NACL 10-0.9 MG/250ML-% IV SOLN
INTRAVENOUS | Status: DC | PRN
Start: 2020-08-24 — End: 2020-08-24
  Administered 2020-08-24: 25 ug/min via INTRAVENOUS

## 2020-08-24 MED ORDER — NABUMETONE 500 MG PO TABS
750.0000 mg | ORAL_TABLET | Freq: Two times a day (BID) | ORAL | Status: DC
  Administered 2020-08-24 – 2020-08-25 (×3): 750 mg via ORAL
  Filled 2020-08-24 (×4): qty 2

## 2020-08-24 MED ORDER — SUGAMMADEX SODIUM 200 MG/2ML IV SOLN
INTRAVENOUS | Status: DC | PRN
  Administered 2020-08-24: 150 mg via INTRAVENOUS
  Administered 2020-08-24: 50 mg via INTRAVENOUS

## 2020-08-24 MED ORDER — CHLORHEXIDINE GLUCONATE 0.12 % MT SOLN
15.0000 mL | Freq: Once | OROMUCOSAL | Status: AC
  Administered 2020-08-24: 15 mL via OROMUCOSAL
  Filled 2020-08-24: qty 15

## 2020-08-24 MED ORDER — DIPHENOXYLATE-ATROPINE 2.5-0.025 MG PO TABS: 1.0000 | ORAL_TABLET | Freq: Four times a day (QID) | ORAL | Status: DC | PRN

## 2020-08-24 MED ORDER — SIMVASTATIN 20 MG PO TABS
20.0000 mg | ORAL_TABLET | Freq: Every day | ORAL | Status: DC
  Administered 2020-08-24: 20 mg via ORAL
  Filled 2020-08-24: qty 1

## 2020-08-24 MED ORDER — NITROFURANTOIN MACROCRYSTAL 50 MG PO CAPS
100.0000 mg | ORAL_CAPSULE | Freq: Every day | ORAL | Status: DC
  Administered 2020-08-24 – 2020-08-25 (×2): 100 mg via ORAL
  Filled 2020-08-24 (×2): qty 2
  Filled 2020-08-24 (×2): qty 1

## 2020-08-24 MED ORDER — ROCURONIUM BROMIDE 10 MG/ML (PF) SYRINGE
PREFILLED_SYRINGE | INTRAVENOUS | Status: AC
  Filled 2020-08-24: qty 10

## 2020-08-24 MED ORDER — LACTATED RINGERS IV SOLN: INTRAVENOUS | Status: DC | PRN

## 2020-08-24 MED ORDER — ALBUTEROL SULFATE (2.5 MG/3ML) 0.083% IN NEBU: 2.5000 mg | INHALATION_SOLUTION | RESPIRATORY_TRACT | Status: AC

## 2020-08-24 MED ORDER — LEVALBUTEROL HCL 1.25 MG/0.5ML IN NEBU: 1.2500 mg | INHALATION_SOLUTION | Freq: Two times a day (BID) | RESPIRATORY_TRACT | Status: DC | PRN

## 2020-08-24 MED ORDER — DEXAMETHASONE SODIUM PHOSPHATE 10 MG/ML IJ SOLN
INTRAMUSCULAR | Status: AC
  Filled 2020-08-24: qty 1

## 2020-08-24 MED ORDER — PROPOFOL 10 MG/ML IV BOLUS
INTRAVENOUS | Status: DC | PRN
  Administered 2020-08-24: 160 mg via INTRAVENOUS

## 2020-08-24 MED ORDER — ALBUTEROL SULFATE (2.5 MG/3ML) 0.083% IN NEBU
INHALATION_SOLUTION | RESPIRATORY_TRACT | Status: AC
  Administered 2020-08-24: 2.5 mg via RESPIRATORY_TRACT
  Filled 2020-08-24: qty 3

## 2020-08-24 MED ORDER — PROPOFOL 10 MG/ML IV BOLUS
INTRAVENOUS | Status: AC
  Filled 2020-08-24: qty 40

## 2020-08-24 MED ORDER — ALBUTEROL SULFATE HFA 108 (90 BASE) MCG/ACT IN AERS
INHALATION_SPRAY | RESPIRATORY_TRACT | Status: DC | PRN
  Administered 2020-08-24: 4 via RESPIRATORY_TRACT

## 2020-08-24 MED ORDER — DEXAMETHASONE SODIUM PHOSPHATE 10 MG/ML IJ SOLN
INTRAMUSCULAR | Status: DC | PRN
  Administered 2020-08-24: 5 mg via INTRAVENOUS

## 2020-08-24 MED ORDER — GABAPENTIN 300 MG PO CAPS
300.0000 mg | ORAL_CAPSULE | Freq: Every day | ORAL | Status: DC
  Administered 2020-08-24: 300 mg via ORAL
  Filled 2020-08-24: qty 1

## 2020-08-24 MED ORDER — FLUOXETINE HCL 20 MG PO CAPS
20.0000 mg | ORAL_CAPSULE | Freq: Two times a day (BID) | ORAL | Status: DC
  Administered 2020-08-24 – 2020-08-25 (×3): 20 mg via ORAL
  Filled 2020-08-24 (×3): qty 1

## 2020-08-24 MED ORDER — ADULT MULTIVITAMIN W/MINERALS CH
1.0000 | ORAL_TABLET | Freq: Every day | ORAL | Status: DC
  Administered 2020-08-25: 1 via ORAL
  Filled 2020-08-24 (×3): qty 1

## 2020-08-24 MED ORDER — FENTANYL CITRATE (PF) 250 MCG/5ML IJ SOLN
INTRAMUSCULAR | Status: AC
  Filled 2020-08-24: qty 5

## 2020-08-24 MED ORDER — EPINEPHRINE PF 1 MG/ML IJ SOLN
INTRAMUSCULAR | Status: AC
  Filled 2020-08-24: qty 1

## 2020-08-24 MED ORDER — ACETAMINOPHEN 500 MG PO TABS
1000.0000 mg | ORAL_TABLET | Freq: Once | ORAL | Status: AC
  Administered 2020-08-24: 1000 mg via ORAL
  Filled 2020-08-24: qty 2

## 2020-08-24 MED ORDER — ONDANSETRON HCL 4 MG/2ML IJ SOLN
INTRAMUSCULAR | Status: AC
  Filled 2020-08-24: qty 2

## 2020-08-24 MED ORDER — 0.9 % SODIUM CHLORIDE (POUR BTL) OPTIME
TOPICAL | Status: DC | PRN
  Administered 2020-08-24: 1000 mL

## 2020-08-24 SURGICAL SUPPLY — 44 items
ADAPTER BRONCHOSCOPE OLYMPUS (ADAPTER) ×2 IMPLANT
ADAPTER VALVE BIOPSY EBUS (MISCELLANEOUS) IMPLANT
ADPR BSCP OLMPS EDG (ADAPTER) ×1
ADPTR VALVE BIOPSY EBUS (MISCELLANEOUS)
BLADE CLIPPER SURG (BLADE) ×1 IMPLANT
BRUSH BIOPSY BRONCH 10 SDTNB (MISCELLANEOUS) ×1 IMPLANT
BRUSH SUPERTRAX BIOPSY (INSTRUMENTS) ×1 IMPLANT
BRUSH SUPERTRAX NDL-TIP CYTO (INSTRUMENTS) ×3 IMPLANT
CANISTER SUCT 3000ML PPV (MISCELLANEOUS) ×2 IMPLANT
CNTNR URN SCR LID CUP LEK RST (MISCELLANEOUS) ×2 IMPLANT
CONT SPEC 4OZ STRL OR WHT (MISCELLANEOUS) ×8
COVER BACK TABLE 60X90IN (DRAPES) ×2 IMPLANT
FILTER STRAW FLUID ASPIR (MISCELLANEOUS) ×1 IMPLANT
FORCEPS BIOP SUPERTRX PREMAR (INSTRUMENTS) ×1 IMPLANT
GAUZE SPONGE 4X4 12PLY STRL (GAUZE/BANDAGES/DRESSINGS) ×2 IMPLANT
GLOVE SURG SIGNA 7.5 PF LTX (GLOVE) ×2 IMPLANT
GOWN STRL REUS W/ TWL XL LVL3 (GOWN DISPOSABLE) ×1 IMPLANT
GOWN STRL REUS W/TWL XL LVL3 (GOWN DISPOSABLE) ×2
KIT CLEAN ENDO COMPLIANCE (KITS) ×2 IMPLANT
KIT ILLUMISITE 180 PROCEDURE (KITS) ×1 IMPLANT
KIT ILLUMISITE 90 PROCEDURE (KITS) IMPLANT
KIT TURNOVER KIT B (KITS) ×2 IMPLANT
MARKER SKIN DUAL TIP RULER LAB (MISCELLANEOUS) ×2 IMPLANT
NDL SUPERTRX PREMARK BIOPSY (NEEDLE) IMPLANT
NEEDLE SUPERTRX PREMARK BIOPSY (NEEDLE) ×4 IMPLANT
NS IRRIG 1000ML POUR BTL (IV SOLUTION) ×2 IMPLANT
OIL SILICONE PENTAX (PARTS (SERVICE/REPAIRS)) ×2 IMPLANT
PAD ARMBOARD 7.5X6 YLW CONV (MISCELLANEOUS) ×4 IMPLANT
PATCHES PATIENT (LABEL) ×6 IMPLANT
SYR 10ML LL (SYRINGE) ×1 IMPLANT
SYR 20ML ECCENTRIC (SYRINGE) ×3 IMPLANT
SYR 20ML LL LF (SYRINGE) ×3 IMPLANT
SYR 30ML LL (SYRINGE) ×2 IMPLANT
SYR 50ML LL SCALE MARK (SYRINGE) ×1 IMPLANT
SYR 5ML LL (SYRINGE) ×2 IMPLANT
TOWEL GREEN STERILE (TOWEL DISPOSABLE) ×2 IMPLANT
TOWEL GREEN STERILE FF (TOWEL DISPOSABLE) ×2 IMPLANT
TRAP SPECIMEN MUCUS 40CC (MISCELLANEOUS) ×2 IMPLANT
TUBE CONNECTING 20X1/4 (TUBING) ×4 IMPLANT
UNDERPAD 30X36 HEAVY ABSORB (UNDERPADS AND DIAPERS) ×2 IMPLANT
VALVE BIOPSY  SINGLE USE (MISCELLANEOUS) ×2
VALVE BIOPSY SINGLE USE (MISCELLANEOUS) ×1 IMPLANT
VALVE SUCTION BRONCHIO DISP (MISCELLANEOUS) ×2 IMPLANT
WATER STERILE IRR 1000ML POUR (IV SOLUTION) ×2 IMPLANT

## 2020-08-24 NOTE — Anesthesia Postprocedure Evaluation (Signed)
Anesthesia Post Note  Patient: Angela Phillips  Procedure(s) Performed: VIDEO BRONCHOSCOPY WITH ENDOBRONCHIAL NAVIGATION (N/A )     Patient location during evaluation: PACU Anesthesia Type: General Level of consciousness: sedated Pain management: pain level controlled Vital Signs Assessment: post-procedure vital signs reviewed and stable Respiratory status: spontaneous breathing and respiratory function stable Cardiovascular status: stable Postop Assessment: no apparent nausea or vomiting Anesthetic complications: no   No complications documented.  Last Vitals:  Vitals:   08/24/20 1155 08/24/20 1218  BP: 139/74 (!) 142/72  Pulse: 87   Resp: 18 18  Temp: 36.6 C 36.6 C  SpO2: 96%     Last Pain:  Vitals:   08/24/20 1218  TempSrc: Oral  PainSc: 5                  Angela Phillips DANIEL

## 2020-08-24 NOTE — Brief Op Note (Addendum)
08/24/2020  10:33 AM  PATIENT:  Angela Phillips  75 y.o. female  PRE-OPERATIVE DIAGNOSIS:  BILATERAL LUNG NODULES  POST-OPERATIVE DIAGNOSIS:  BILATERAL LUNG NODULES,  MALIGNANT CELLS PRESENT  PROCEDURE:  Procedure(s): VIDEO BRONCHOSCOPY WITH ENDOBRONCHIAL NAVIGATION (N/A) Needle aspirations, brushings, BAL and transbronchial biopsies of LUL and RLL nodules  SURGEON:  Surgeon(s) and Role:    * Melrose Nakayama, MD - Primary  PHYSICIAN ASSISTANT:   ASSISTANTS: none   ANESTHESIA:   general  EBL: minimal  BLOOD ADMINISTERED:none  DRAINS: none   LOCAL MEDICATIONS USED:  NONE  SPECIMEN:  Source of Specimen:  RLL and LUL nodules  DISPOSITION OF SPECIMEN:  PATHOLOGY  COUNTS:  NO -  TOURNIQUET:  * No tourniquets in log *  DICTATION: .Other Dictation: Dictation Number -  PLAN OF CARE: Discharge to home after PACU  PATIENT DISPOSITION:  PACU - hemodynamically stable.   Delay start of Pharmacological VTE agent (>24hrs) due to surgical blood loss or risk of bleeding: not applicable  Fluoro time: 6.3 min, dose 119 mGy (both sites)

## 2020-08-24 NOTE — Interval H&P Note (Signed)
History and Physical Interval Note:  08/24/2020 7:58 AM  Angela Phillips  has presented today for surgery, with the diagnosis of [place].  The various methods of treatment have been discussed with the patient and family. After consideration of risks, benefits and other options for treatment, the patient has consented to  Procedure(s): [place] (N/A) as a surgical intervention.  The patient's history has been reviewed, patient examined, no change in status, stable for surgery.  I have reviewed the patient's chart and labs.  Questions were answered to the patient's satisfaction.     Melrose Nakayama

## 2020-08-24 NOTE — Progress Notes (Signed)
      [address]       [place] [address]             [phone]      Angela Phillips has a small right pneumothorax post bronchoscopy with biopsies  She is stable, but does c/o some shortness of breath  She does not need a chest tube at this point, but I think it is safest to keep her for 24 hours observation.  D/w patient and her husband  Will repeat a chest Xray in a couple of hours and again in AM  [place] C. Roxan Hockey, MD [facility] [phone]

## 2020-08-24 NOTE — Anesthesia Procedure Notes (Signed)
Procedure Name: Intubation Date/Time: 08/24/2020 8:14 AM Performed by: Glynda Jaeger, CRNA Pre-anesthesia Checklist: Patient identified, Patient being monitored, Timeout performed, Emergency Drugs available and Suction available Patient Re-evaluated:Patient Re-evaluated prior to induction Oxygen Delivery Method: Circle System Utilized Preoxygenation: Pre-oxygenation with 100% oxygen Induction Type: IV induction Ventilation: Mask ventilation without difficulty Laryngoscope Size: Mac and 4 Grade View: Grade I Tube type: Oral Tube size: 8.5 mm Number of attempts: 1 Airway Equipment and Method: Bougie stylet Placement Confirmation: ETT inserted through vocal cords under direct vision,  positive ETCO2 and breath sounds checked- equal and bilateral Secured at: 21 cm Tube secured with: Tape Dental Injury: Teeth and Oropharynx as per pre-operative assessment

## 2020-08-24 NOTE — Discharge Instructions (Signed)
Do not drive or engage in heavy physical activity for 24 hours  You may resume normal activities tomorrow.  You may cough up small amounts of blood over the next few days. Call [phone] if you cough up more than a tablespoon of blood.  You may use acetaminophen (Tylenol) if needed for discomfort. You may use an over the counter cough medication if needed.

## 2020-08-24 NOTE — Transfer of Care (Signed)
Immediate Anesthesia Transfer of Care Note  Patient: Angela Phillips  Procedure(s) Performed: VIDEO BRONCHOSCOPY WITH ENDOBRONCHIAL NAVIGATION (N/A )  Patient Location: PACU  Anesthesia Type:General  Level of Consciousness: drowsy, patient cooperative and responds to stimulation  Airway & Oxygen Therapy: Patient Spontanous Breathing and Patient connected to face mask oxygen  Post-op Assessment: Report given to RN and Post -op Vital signs reviewed and stable  Post vital signs: Reviewed and stable  Last Vitals:  Vitals Value Taken Time  BP 134/56 08/24/20 1022  Temp    Pulse 92 08/24/20 1024  Resp 19 08/24/20 1024  SpO2 91 % 08/24/20 1024  Vitals shown include unvalidated device data.  Last Pain:  Vitals:   08/24/20 0658  TempSrc: Oral  PainSc: 0-No pain      Patients Stated Pain Goal: 2 (34/19/37 9024)  Complications: No complications documented.

## 2020-08-24 NOTE — Progress Notes (Signed)
I updated Dr. Roxan Hockey that per Dr. Donnal Debar, patient needs to be evaluated by rad onc to see if she is a candidate for SBRT.  Dr. Roxan Hockey would like referral to rad onc here. I will complete and notify rad onc team.

## 2020-08-24 NOTE — Discharge Summary (Addendum)
Physician Discharge Summary  Patient ID: Angela Phillips MRN: [id number] DOB/AGE: 1945-09-11 74 y.o.  Admit date: 08/24/2020 Discharge date: 08/25/2020  Admission Diagnoses:  Patient Active Problem List   Diagnosis Date Noted  . Pneumothorax on right 08/24/2020  . SVT (supraventricular tachycardia) ([place]) 10/19/2018  . 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 Impaired mobility and ADLs 10/28/2017  . Falls 11/20/2016  . HTN (hypertension) 11/20/2016  . Subdural hematoma ([place]) 11/20/2016  . Atypical chest pain 08/13/2016  . Dyslipidemia (high LDL; low HDL) 08/13/2016  . Palpitations 07/05/2016  . Paroxysmal SVT (supraventricular tachycardia) ([place]) 07/05/2016   Discharge Diagnoses:   Patient Active Problem List   Diagnosis Date Noted  . Pneumothorax on right 08/24/2020  . H/O ultrasound guided needle biopsy of lung 08/24/2020  . SVT (supraventricular tachycardia) ([place]) 10/19/2018  . 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 Impaired mobility and ADLs 10/28/2017  . Falls 11/20/2016  . HTN (hypertension) 11/20/2016  . Subdural hematoma ([place]) 11/20/2016  . Atypical chest pain 08/13/2016  . Dyslipidemia (high LDL; low HDL) 08/13/2016  . Palpitations 07/05/2016  . Paroxysmal SVT (supraventricular tachycardia) ([place]) 07/05/2016   Discharged Condition: good  History of Present Illness:  Angela Phillips is a 74 yo woman with a past medical history significant for hypertension, hyperlipidemia, paroxysmal SVT, ablation, and asthma.  She has remote history of light tobacco use. She smoked about 1/2 pack/day for approximately 15 years. She quit 30 years ago.  She recently was evaluated in the emergency room for shortness of breath and wheezing. A chest x-ray showed possible lung nodules. That was confirmed with CT scan in June 2021. She hada 1.9 x 1.6 cm groundglass opacity in the left apex and a 2.1 x 1.8 cm subsolid nodule in the posterior right lower lobe. There is no  mediastinal or hilar adenopathy. A PET/CT showed no significant uptake in either nodule.   She had a follow-up CT on 07/06/2020 at [facility] which showed a 2.4 cm nodule in the right lower lobe with a 5 mm solid component and a 2 cm subsolid nodule in the left apex.   She was evaluated by Dr. Roxan Hockey on 07/25/2020. She complained of shortness of breath with even relatively minor exertion. Wears 3L [place] O2 at night. It was felt her symptoms were out of proportion to her PFT results and she was referred her to Cardiology. She saw Dr. Sallyanne Kuster and had a nuclear stress test which was normal.  She again presented for follow up with Dr. Roxan Hockey on 08/15/2020 at which time due to her having bilateral lesions it was felt an electromagnetic navigational bronchoscopy with biopsies would be indicated.  The risks and benefits of the procedure were explained to the patient and she was agreeable to proceed.  Hospital Course:   Angela Phillips presented to [facility].  The patient was taken to the operating room and underwent Video Bronchoscopy with Endobronchial Navigation, needle aspirations, brushings, BAL, and transbronchial biopsies of LUL and RLL nodules.  She tolerated the procedure and was taken to the PACU in stable condition.  Her follow up CXR showed a small pneumothorax on the right.  The patient complained of shortness of breath.  However it was not felt a chest tube would be indicated.  It was felt being the patient was experiencing shortness of breath she should remain in the hospital for observation. Follow up CXRs have remained stable with small apical pneumothorax.  She is medically stable for discharge today.  Treatments: surgery:   Electromagnetic navigational bronchoscopy with needle aspirations, brushings, bronchoalveolar lavage, and transbronchial biopsies of left upper lobe and right lower lobe nodules.  Discharge Exam: Blood pressure 112/61, pulse 78, temperature  97.7 F (36.5 C), temperature source Oral, resp. rate 20, height _0  (1.549 m), weight 55.7 kg, SpO2 91 %.  General appearance: alert, cooperative and no distress Heart: regular rate and rhythm Lungs: clear to auscultation bilaterally   Discharge disposition: 01-Home or Self Care  Allergies as of 08/25/2020      Reactions   Aspirin Other (See Comments)   Bleeding (intolerance)   Buspirone Other (See Comments)   Patient does not recall taking this med   Celecoxib Other (See Comments)   Patient cannot remember reaction   Chlorpheniramine Other (See Comments)   Patient cannot remember reaction   Citalopram Other (See Comments)   Patient cannot remember reaction   Ezetimibe Other (See Comments)   Myalgias (intolerance)   Hydrocodone-acetaminophen Itching   Prochlorperazine Other (See Comments)   "Makes me feel like I'm having a face stroke"   Statins Other (See Comments)   Myalgias (intolerance)   Venlafaxine Other (See Comments)   Patient cannot remember reaction      Medication List    TAKE these medications   budesonide 0.5 MG/2ML nebulizer solution Commonly known as: PULMICORT Take 0.5 mg by nebulization 2 (two) times daily.   clonazePAM 1 MG tablet Commonly known as: KLONOPIN Take 1 mg by mouth 3 (three) times daily as needed for anxiety.   diphenoxylate-atropine 2.5-0.025 MG tablet Commonly known as: LOMOTIL Take 1 tablet by mouth every 6 (six) hours as needed for diarrhea or loose stools.   FLUoxetine 20 MG capsule Commonly known as: PROZAC Take 20 mg by mouth 2 (two) times daily.   gabapentin 300 MG capsule Commonly known as: NEURONTIN Take 300 mg by mouth at bedtime.   levalbuterol 1.25 MG/3ML nebulizer solution Commonly known as: XOPENEX Take 1.25 mg by nebulization 2 (two) times daily.   losartan-hydrochlorothiazide 50-12.5 MG tablet Commonly known as: HYZAAR Take 1 tablet by mouth daily.   Multi-Vitamins Tabs Take 1 tablet by mouth daily.  Centrum silver   nabumetone 750 MG tablet Commonly known as: RELAFEN Take 750 mg by mouth 2 (two) times daily.   nitrofurantoin 100 MG capsule Commonly known as: MACRODANTIN Take 100 mg by mouth daily.   omeprazole 40 MG capsule Commonly known as: PRILOSEC Take 40 mg by mouth daily.   simvastatin 40 MG tablet Commonly known as: ZOCOR Take 20 mg by mouth at bedtime.   Symbicort 160-4.5 MCG/ACT inhaler Generic drug: budesonide-formoterol Inhale 2 puffs into the lungs 2 (two) times daily.        Signed: Ellwood Handler, PA-C 08/25/2020, 8:00 AM

## 2020-08-24 NOTE — Op Note (Signed)
NAME: Angela Phillips, Angela Phillips MEDICAL RECORD FT:[id number] ACCOUNT [id number] DATE OF BIRTH:[birth year] FACILITY: [facility] LOCATION: [facility] PHYSICIAN:Ashar Lewinski Chaya Jan, MD  OPERATIVE REPORT  DATE OF PROCEDURE:  08/24/2020  PREOPERATIVE DIAGNOSIS:  Bilateral lung nodules.  POSTOPERATIVE DIAGNOSIS:  Bilateral lung nodules, malignant cells present.  PROCEDURE:  Electromagnetic navigational bronchoscopy with needle aspirations, brushings, bronchoalveolar lavage, and transbronchial biopsies of left upper lobe and right lower lobe nodules.  SURGEON:  [place] Charon, MD  ASSISTANT:  None.  ANESTHESIA:  General.  FINDINGS:  Adequate malignant cells noted on brushings from right lower lobe nodule.  CLINICAL NOTE:  Angela Phillips is a 74 year old woman with a remote history of light tobacco use.  She recently was evaluated for shortness of breath and wheezing.  A chest x-ray showed possible lung nodules.  Those were confirmed on CT.  She was found to  have nodules in the left apex as well as the posterior right lower lobe.  There was no mediastinal or hilar adenopathy.  PET-CT showed no significant uptake in either nodule.  On a followup CT, the nodules had both increased in size.  She was referred  for consideration for biopsy.  She was advised to undergo navigational bronchoscopy to sample both lesions.  The indications, risks, benefits, and alternatives were discussed in detail with the patient.  She understood and accepted the risks and agreed  to proceed.  DESCRIPTION OF PROCEDURE:  Angela Phillips was brought to the operating room on 08/24/2020.  She had induction of general anesthesia and was intubated.  Sequential compression devices were placed on the calves for DVT prophylaxis.  A warming blanket was  placed.  Mapping of the nodules was done prior to induction.  A timeout was performed.  Flexible fiberoptic bronchoscopy was performed via the endotracheal tube.  Endotracheal tube was  initially in the right main stem, it was repositioned into the mid trachea.  There was normal endobronchial anatomy with no endobronchial lesions to the  level of the subsegmental bronchi.  The locatable guide for navigation was placed.  Registration was performed.  Initially, an attempt was made to navigate to the left apical nodule; however, the appropriate subsegmental bronchus was unable to be cannulated on the first attempt.  Attention  then was turned to the right lower lobe nodule, and the appropriate subsegmental bronchus was cannulated, but it was very difficult to get the catheter to go to the nodule itself as there was a larger airway at a less acute angle.  Ultimately, with  multiple attempts at repositioning, the catheter was advanced to within 2 cm of the lesion.  Local registration was performed.  The catheter was repositioned with reasonably good alignment to the edge of the lesion.  Sampling was performed.  All sampling  was done with fluoroscopy.  Needle brushings and needle aspirations were performed.  The specimens were relatively acellular, but one did show suspicious cells that was sent to pathology and later confirmed to be malignant cells.  Multiple biopsies were  obtained.  The catheter was repositioned on multiple occasions as the catheter often would come off axis with the lesion when the instrument was advanced.  Biopsies were obtained from the region at different angles  based on fluoroscopy.  The biopsies  were all sent for permanent pathology.  Finally, a bronchoalveolar lavage was performed with instillation of 100 mL of saline and withdrawal of about 15 mm of saline.  The bronchoscope then was redirected again to the left upper lobe bronchus and then  the catheter advanced, this time very easily, to within 1.8 centimeter of the nodule with good alignment.  Local registration was again performed.  Samples were obtained  both with and without the local registration on and  appeared to be good alignment with the nodule from both directions.  Sampling was performed with needle brush, needle aspirations, and a triple brush.  Multiple biopsies were obtained as well.  These  specimens were mostly bloody and not adequate.  By this point, we had the diagnosis from the right lower lobe nodule.  Bronchoalveolar lavage was performed again with instillation of 100 mL of saline and return of approximately 10 ml.  This will be sent for  cytology as well.  The locatable guide was removed.  Final inspection was made with the bronchoscope.  There was no ongoing bleeding.  Final inspection was made with fluoroscopy, and there was no evidence of pneumothorax.  The patient then was extubated  in the operating room and taken to the [facility] in good condition.  The total fluoroscopy time was 6.3 minutes and the total dose was 120 milligray.  IN/NUANCE  D:08/24/2020 T:08/24/2020 JOB:[id number]

## 2020-08-25 ENCOUNTER — Telehealth: Payer: Self-pay | Admitting: *Deleted

## 2020-08-25 ENCOUNTER — Encounter (HOSPITAL_COMMUNITY): Payer: Self-pay | Admitting: Thoracic Surgery (Cardiothoracic Vascular Surgery)

## 2020-08-25 ENCOUNTER — Observation Stay (HOSPITAL_COMMUNITY): Payer: Medicare HMO

## 2020-08-25 DIAGNOSIS — I700 Atherosclerosis of aorta: Secondary | ICD-10-CM | POA: Diagnosis not present

## 2020-08-25 DIAGNOSIS — R911 Solitary pulmonary nodule: Secondary | ICD-10-CM | POA: Diagnosis not present

## 2020-08-25 DIAGNOSIS — Z79899 Other long term (current) drug therapy: Secondary | ICD-10-CM | POA: Diagnosis not present

## 2020-08-25 DIAGNOSIS — J95811 Postprocedural pneumothorax: Secondary | ICD-10-CM | POA: Diagnosis not present

## 2020-08-25 DIAGNOSIS — Z87891 Personal history of nicotine dependence: Secondary | ICD-10-CM | POA: Diagnosis not present

## 2020-08-25 DIAGNOSIS — J939 Pneumothorax, unspecified: Secondary | ICD-10-CM | POA: Diagnosis not present

## 2020-08-25 DIAGNOSIS — J45909 Unspecified asthma, uncomplicated: Secondary | ICD-10-CM | POA: Diagnosis not present

## 2020-08-25 DIAGNOSIS — I10 Essential (primary) hypertension: Secondary | ICD-10-CM | POA: Diagnosis not present

## 2020-08-25 DIAGNOSIS — R0602 Shortness of breath: Secondary | ICD-10-CM | POA: Diagnosis not present

## 2020-08-25 NOTE — Progress Notes (Signed)
Pt discharged today to home with husband.  Pt's IV removed.  Pt taken off telemetry and CCMD notified.  Pt left with all of their personal belongings.  AVS documentation reviewed with Pt and all questions answered.

## 2020-08-25 NOTE — Progress Notes (Signed)
      [address]       [place] [address]             [phone]      1 Day Post-Op Procedure(s) (LRB): VIDEO BRONCHOSCOPY WITH ENDOBRONCHIAL NAVIGATION (N/A)   Subjective:  No new complaints.  Trying to get a hold of her husband to come pick her up.  Objective: Vital signs in last 24 hours: Temp:  [97.1 F (36.2 C)-98.4 F (36.9 C)] 97.7 F (36.5 C) (12/17 0500) Pulse Rate:  [78-97] 78 (12/17 0500) Cardiac Rhythm: Sinus tachycardia (12/16 1900) Resp:  [16-23] 20 (12/17 0500) BP: (88-142)/(56-81) 112/61 (12/17 0500) SpO2:  [91 %-97 %] 91 % (12/17 0500)  Intake/Output from previous day: 12/16 0701 - 12/17 0700 In: 950 [P.O.:240; I.V.:710] Out: -   General appearance: alert, cooperative and no distress Heart: regular rate and rhythm Lungs: clear to auscultation bilaterally  Lab Results: Recent Labs    08/23/20 1022  WBC 8.7  HGB 15.4*  HCT 47.1*  PLT 272   BMET:  Recent Labs    08/23/20 1022  NA 139  K 3.5  CL 101  CO2 29  GLUCOSE 106*  BUN 9  CREATININE 0.82  CALCIUM 9.5    PT/INR:  Recent Labs    08/23/20 1022  LABPROT 12.6  INR 1.0   ABG No results found for: PHART, HCO3, TCO2, ACIDBASEDEF, O2SAT CBG (last 3)  No results for input(s): GLUCAP in the last 72 hours.  Assessment/Plan: S/P Procedure(s) (LRB): VIDEO BRONCHOSCOPY WITH ENDOBRONCHIAL NAVIGATION (N/A)  1. Pneumothorax, S/P ENB yesterday- CXR continues to show stable appearance of small apical pneumothorax. Will d/c home today   LOS: 0 days   Ellwood Handler, PA-C 08/25/2020

## 2020-08-25 NOTE — Progress Notes (Signed)
1 Day Post-Op Procedure(s) (LRB): VIDEO BRONCHOSCOPY WITH ENDOBRONCHIAL NAVIGATION (N/A) Subjective: Feels better this AM  Objective: Vital signs in last 24 hours: Temp:  [97.1 F (36.2 C)-98.4 F (36.9 C)] 97.7 F (36.5 C) (12/17 0500) Pulse Rate:  [78-97] 78 (12/17 0500) Cardiac Rhythm: Sinus tachycardia (12/16 1900) Resp:  [16-23] 20 (12/17 0500) BP: (88-142)/(56-81) 112/61 (12/17 0500) SpO2:  [91 %-97 %] 91 % (12/17 0500)  Hemodynamic parameters for last 24 hours:    Intake/Output from previous day: 12/16 0701 - 12/17 0700 In: 950 [P.O.:240; I.V.:710] Out: -  Intake/Output this shift: No intake/output data recorded.  General appearance: alert, cooperative and no distress Lungs: clear to auscultation bilaterally  Lab Results: Recent Labs    08/23/20 1022  WBC 8.7  HGB 15.4*  HCT 47.1*  PLT 272   BMET:  Recent Labs    08/23/20 1022  NA 139  K 3.5  CL 101  CO2 29  GLUCOSE 106*  BUN 9  CREATININE 0.82  CALCIUM 9.5    PT/INR:  Recent Labs    08/23/20 1022  LABPROT 12.6  INR 1.0   ABG No results found for: PHART, HCO3, TCO2, ACIDBASEDEF, O2SAT CBG (last 3)  No results for input(s): GLUCAP in the last 72 hours.  Assessment/Plan: S/P Procedure(s) (LRB): VIDEO BRONCHOSCOPY WITH ENDOBRONCHIAL NAVIGATION (N/A) -CXR shows minimal right apical pneumothorax- no change to slight decrease from yesterday Dc home Will arrange Rad Onc consult as outpatient   LOS: 0 days    Melrose Nakayama 08/25/2020

## 2020-08-25 NOTE — Telephone Encounter (Signed)
LVM for call back to schedule appointment with Dr. Sondra Come

## 2020-08-28 ENCOUNTER — Telehealth: Payer: Self-pay

## 2020-08-28 NOTE — Telephone Encounter (Signed)
Patient contacted the office requesting more information about her lung biopsy/ ENB done by Dr. Roxan Hockey 08/24/20.  She was requesting type of cancer and staging.  Advised that information has not come back from lab and would not be given.  Advised that she does have a follow-up appointment with Dr. Roxan Hockey 09/04/20 to go over results, she acknowledged receipt.

## 2020-08-29 DIAGNOSIS — M546 Pain in thoracic spine: Secondary | ICD-10-CM | POA: Diagnosis not present

## 2020-08-29 DIAGNOSIS — R911 Solitary pulmonary nodule: Secondary | ICD-10-CM | POA: Diagnosis not present

## 2020-08-29 DIAGNOSIS — M19012 Primary osteoarthritis, left shoulder: Secondary | ICD-10-CM | POA: Diagnosis not present

## 2020-08-29 DIAGNOSIS — J984 Other disorders of lung: Secondary | ICD-10-CM | POA: Diagnosis not present

## 2020-08-29 DIAGNOSIS — S2242XA Multiple fractures of ribs, left side, initial encounter for closed fracture: Secondary | ICD-10-CM | POA: Diagnosis not present

## 2020-08-29 DIAGNOSIS — R0789 Other chest pain: Secondary | ICD-10-CM | POA: Diagnosis not present

## 2020-08-29 DIAGNOSIS — W06XXXA Fall from bed, initial encounter: Secondary | ICD-10-CM | POA: Diagnosis not present

## 2020-08-29 DIAGNOSIS — I700 Atherosclerosis of aorta: Secondary | ICD-10-CM | POA: Diagnosis not present

## 2020-08-29 DIAGNOSIS — S4992XA Unspecified injury of left shoulder and upper arm, initial encounter: Secondary | ICD-10-CM | POA: Diagnosis not present

## 2020-08-29 DIAGNOSIS — M545 Low back pain, unspecified: Secondary | ICD-10-CM | POA: Diagnosis not present

## 2020-08-29 DIAGNOSIS — S2232XA Fracture of one rib, left side, initial encounter for closed fracture: Secondary | ICD-10-CM | POA: Diagnosis not present

## 2020-09-02 DIAGNOSIS — I6782 Cerebral ischemia: Secondary | ICD-10-CM | POA: Diagnosis not present

## 2020-09-02 DIAGNOSIS — R627 Adult failure to thrive: Secondary | ICD-10-CM | POA: Diagnosis not present

## 2020-09-02 DIAGNOSIS — R296 Repeated falls: Secondary | ICD-10-CM | POA: Diagnosis not present

## 2020-09-02 DIAGNOSIS — J90 Pleural effusion, not elsewhere classified: Secondary | ICD-10-CM | POA: Diagnosis not present

## 2020-09-02 DIAGNOSIS — R0781 Pleurodynia: Secondary | ICD-10-CM | POA: Diagnosis not present

## 2020-09-02 DIAGNOSIS — J9811 Atelectasis: Secondary | ICD-10-CM | POA: Diagnosis not present

## 2020-09-02 DIAGNOSIS — Z7409 Other reduced mobility: Secondary | ICD-10-CM | POA: Diagnosis not present

## 2020-09-02 DIAGNOSIS — W19XXXA Unspecified fall, initial encounter: Secondary | ICD-10-CM | POA: Diagnosis not present

## 2020-09-02 DIAGNOSIS — Z23 Encounter for immunization: Secondary | ICD-10-CM | POA: Diagnosis not present

## 2020-09-02 DIAGNOSIS — R519 Headache, unspecified: Secondary | ICD-10-CM | POA: Diagnosis not present

## 2020-09-02 DIAGNOSIS — T07XXXA Unspecified multiple injuries, initial encounter: Secondary | ICD-10-CM | POA: Diagnosis not present

## 2020-09-02 DIAGNOSIS — C3490 Malignant neoplasm of unspecified part of unspecified bronchus or lung: Secondary | ICD-10-CM | POA: Diagnosis not present

## 2020-09-02 DIAGNOSIS — R55 Syncope and collapse: Secondary | ICD-10-CM | POA: Diagnosis not present

## 2020-09-03 DIAGNOSIS — K219 Gastro-esophageal reflux disease without esophagitis: Secondary | ICD-10-CM | POA: Diagnosis not present

## 2020-09-03 DIAGNOSIS — Z23 Encounter for immunization: Secondary | ICD-10-CM | POA: Diagnosis not present

## 2020-09-03 DIAGNOSIS — Z87891 Personal history of nicotine dependence: Secondary | ICD-10-CM | POA: Diagnosis not present

## 2020-09-03 DIAGNOSIS — R296 Repeated falls: Secondary | ICD-10-CM | POA: Diagnosis not present

## 2020-09-03 DIAGNOSIS — M542 Cervicalgia: Secondary | ICD-10-CM | POA: Diagnosis not present

## 2020-09-03 DIAGNOSIS — W19XXXA Unspecified fall, initial encounter: Secondary | ICD-10-CM | POA: Diagnosis not present

## 2020-09-03 DIAGNOSIS — R630 Anorexia: Secondary | ICD-10-CM | POA: Diagnosis not present

## 2020-09-03 DIAGNOSIS — R112 Nausea with vomiting, unspecified: Secondary | ICD-10-CM | POA: Diagnosis not present

## 2020-09-03 DIAGNOSIS — R55 Syncope and collapse: Secondary | ICD-10-CM | POA: Diagnosis not present

## 2020-09-03 DIAGNOSIS — J9811 Atelectasis: Secondary | ICD-10-CM | POA: Diagnosis not present

## 2020-09-03 DIAGNOSIS — E875 Hyperkalemia: Secondary | ICD-10-CM | POA: Diagnosis not present

## 2020-09-03 DIAGNOSIS — R197 Diarrhea, unspecified: Secondary | ICD-10-CM | POA: Diagnosis not present

## 2020-09-03 DIAGNOSIS — Z7409 Other reduced mobility: Secondary | ICD-10-CM | POA: Diagnosis not present

## 2020-09-03 DIAGNOSIS — J90 Pleural effusion, not elsewhere classified: Secondary | ICD-10-CM | POA: Diagnosis not present

## 2020-09-03 DIAGNOSIS — C3490 Malignant neoplasm of unspecified part of unspecified bronchus or lung: Secondary | ICD-10-CM | POA: Diagnosis not present

## 2020-09-03 DIAGNOSIS — S41111A Laceration without foreign body of right upper arm, initial encounter: Secondary | ICD-10-CM | POA: Diagnosis not present

## 2020-09-03 DIAGNOSIS — R109 Unspecified abdominal pain: Secondary | ICD-10-CM | POA: Diagnosis not present

## 2020-09-03 DIAGNOSIS — I6782 Cerebral ischemia: Secondary | ICD-10-CM | POA: Diagnosis not present

## 2020-09-03 DIAGNOSIS — I10 Essential (primary) hypertension: Secondary | ICD-10-CM | POA: Diagnosis not present

## 2020-09-03 DIAGNOSIS — N3000 Acute cystitis without hematuria: Secondary | ICD-10-CM | POA: Diagnosis not present

## 2020-09-03 DIAGNOSIS — I6529 Occlusion and stenosis of unspecified carotid artery: Secondary | ICD-10-CM | POA: Diagnosis not present

## 2020-09-03 DIAGNOSIS — E876 Hypokalemia: Secondary | ICD-10-CM | POA: Diagnosis not present

## 2020-09-03 DIAGNOSIS — M47812 Spondylosis without myelopathy or radiculopathy, cervical region: Secondary | ICD-10-CM | POA: Diagnosis not present

## 2020-09-03 DIAGNOSIS — T07XXXA Unspecified multiple injuries, initial encounter: Secondary | ICD-10-CM | POA: Diagnosis not present

## 2020-09-03 DIAGNOSIS — M4692 Unspecified inflammatory spondylopathy, cervical region: Secondary | ICD-10-CM | POA: Diagnosis not present

## 2020-09-03 DIAGNOSIS — R627 Adult failure to thrive: Secondary | ICD-10-CM | POA: Diagnosis not present

## 2020-09-03 DIAGNOSIS — Z79899 Other long term (current) drug therapy: Secondary | ICD-10-CM | POA: Diagnosis not present

## 2020-09-03 DIAGNOSIS — R519 Headache, unspecified: Secondary | ICD-10-CM | POA: Diagnosis not present

## 2020-09-03 DIAGNOSIS — S0990XA Unspecified injury of head, initial encounter: Secondary | ICD-10-CM | POA: Diagnosis not present

## 2020-09-03 DIAGNOSIS — I4519 Other right bundle-branch block: Secondary | ICD-10-CM | POA: Diagnosis not present

## 2020-09-04 ENCOUNTER — Ambulatory Visit: Payer: Medicare HMO | Admitting: Thoracic Surgery (Cardiothoracic Vascular Surgery)

## 2020-09-04 DIAGNOSIS — E785 Hyperlipidemia, unspecified: Secondary | ICD-10-CM | POA: Diagnosis not present

## 2020-09-04 DIAGNOSIS — M47812 Spondylosis without myelopathy or radiculopathy, cervical region: Secondary | ICD-10-CM | POA: Diagnosis not present

## 2020-09-04 DIAGNOSIS — R55 Syncope and collapse: Secondary | ICD-10-CM | POA: Diagnosis not present

## 2020-09-04 DIAGNOSIS — N3000 Acute cystitis without hematuria: Secondary | ICD-10-CM

## 2020-09-04 DIAGNOSIS — R296 Repeated falls: Secondary | ICD-10-CM | POA: Diagnosis not present

## 2020-09-04 DIAGNOSIS — I6529 Occlusion and stenosis of unspecified carotid artery: Secondary | ICD-10-CM | POA: Diagnosis not present

## 2020-09-04 DIAGNOSIS — I10 Essential (primary) hypertension: Secondary | ICD-10-CM | POA: Diagnosis not present

## 2020-09-04 DIAGNOSIS — M4692 Unspecified inflammatory spondylopathy, cervical region: Secondary | ICD-10-CM | POA: Diagnosis not present

## 2020-09-04 HISTORY — DX: Acute cystitis without hematuria: N30.00

## 2020-09-05 DIAGNOSIS — C3490 Malignant neoplasm of unspecified part of unspecified bronchus or lung: Secondary | ICD-10-CM | POA: Diagnosis not present

## 2020-09-05 DIAGNOSIS — E785 Hyperlipidemia, unspecified: Secondary | ICD-10-CM | POA: Diagnosis not present

## 2020-09-05 DIAGNOSIS — I4510 Unspecified right bundle-branch block: Secondary | ICD-10-CM | POA: Diagnosis not present

## 2020-09-05 DIAGNOSIS — I491 Atrial premature depolarization: Secondary | ICD-10-CM | POA: Diagnosis not present

## 2020-09-05 DIAGNOSIS — R296 Repeated falls: Secondary | ICD-10-CM | POA: Diagnosis not present

## 2020-09-05 DIAGNOSIS — E871 Hypo-osmolality and hyponatremia: Secondary | ICD-10-CM | POA: Diagnosis not present

## 2020-09-05 DIAGNOSIS — I10 Essential (primary) hypertension: Secondary | ICD-10-CM | POA: Diagnosis not present

## 2020-09-05 DIAGNOSIS — E876 Hypokalemia: Secondary | ICD-10-CM | POA: Diagnosis not present

## 2020-09-07 ENCOUNTER — Ambulatory Visit: Payer: Medicare HMO

## 2020-09-07 ENCOUNTER — Ambulatory Visit
Admission: RE | Admit: 2020-09-07 | Discharge: 2020-09-07 | Disposition: A | Discharge status: Home / Self Care | Payer: Medicare HMO | Source: Ambulatory Visit | Attending: Radiation Oncology | Admitting: Radiation Oncology

## 2020-09-07 NOTE — Progress Notes (Signed)
Error

## 2020-09-12 ENCOUNTER — Encounter (HOSPITAL_COMMUNITY): Payer: Self-pay | Admitting: Thoracic Surgery (Cardiothoracic Vascular Surgery)

## 2020-09-12 LAB — CYTOLOGY - NON PAP

## 2020-09-12 LAB — SURGICAL PATHOLOGY

## 2020-09-13 DIAGNOSIS — J45909 Unspecified asthma, uncomplicated: Secondary | ICD-10-CM | POA: Diagnosis not present

## 2020-09-26 ENCOUNTER — Telehealth (INDEPENDENT_AMBULATORY_CARE_PROVIDER_SITE_OTHER): Payer: Medicare HMO | Admitting: Thoracic Surgery (Cardiothoracic Vascular Surgery)

## 2020-09-26 ENCOUNTER — Other Ambulatory Visit: Payer: Self-pay

## 2020-09-26 DIAGNOSIS — Z9889 Other specified postprocedural states: Secondary | ICD-10-CM

## 2020-09-26 DIAGNOSIS — J939 Pneumothorax, unspecified: Secondary | ICD-10-CM | POA: Diagnosis not present

## 2020-09-26 NOTE — Progress Notes (Addendum)
Patient ID: KENZLEY KE, female   DOB: Apr 04, 1946, 75 y.o.   MRN: [id number]   Angela Phillips was scheduled to return today to discuss results of her bronchoscopy.  This visit was conducted by telephone due to inclement weather.  2 patient identifiers were used.  Patient location home MD location office   I discussed the results of the bronchoscopy with her.  The biopsies were not definitive.  There were atypical cells present.  We discussed her case at our multidisciplinary thoracic oncology conference.  The consensus opinion was that the best option for her would be to proceed with stereotactic radiation.  She had previously been seen by Dr. Orlene Erm and has been referred to Dr. Sondra Come.  She was supposed to have seen Dr. Sondra Come yesterday but that visit was canceled due to the weather.  She has an appointment with Dr. Sondra Come on 10/02/2020.   Angela Standard Roxan Hockey, MD [facility] [phone]  I spent over 5 and less than 10 minutes with Angela Phillips on this visit  Angela Standard. Roxan Hockey, MD [facility] [phone]

## 2020-09-28 NOTE — Progress Notes (Signed)
Thoracic Location of Tumor / Histology: Right lung and Left lung nodules  Patient presented to the emergency room in October 2021 with wheezing and shortness of breath.  Biopsies of 08/24/2020     Tobacco/Marijuana/Snuff/ETOH use: Former Smoker;  Previously smoked 1/2 pack a day for 15 years and stopped 30 years ago.  Past/Anticipated interventions by cardiothoracic surgery, if any: Bronchoscopy 08/24/2020 Dr. Roxan Hockey  Past/Anticipated interventions by medical oncology, if any: none  Signs/Symptoms  Weight changes, if any: loss of weight due to decrease in appetite 8lbs  Respiratory complaints, if any:  Shortness of breath with exertion/uses inhaler often.    Hemoptysis, if any: no  Pain issues, if any: right chest pressure 5 out of 10.  Not continuous.  SAFETY ISSUES:  Prior radiation? no  Pacemaker/ICD? no   Possible current pregnancy? no  Is the patient on methotrexate? no  Current Complaints / other details:  Patient will need follow up materials sent with her so that she can review in a less stressful environment.   Vitals:   10/02/20 1223  BP: (!) 165/80  Pulse: 79  Resp: 20  Temp: (!) 97.2 F (36.2 C)  TempSrc: Temporal  SpO2: 97%  Weight: 118 lb 8 oz (53.8 kg)  Height: 5\' 1"  (1.549 m)   Rhae Hammock, RN, BSN

## 2020-10-01 NOTE — Progress Notes (Signed)
Radiation Oncology         (336) [phone] ________________________________  Initial Outpatient Consultation  Name: Angela Phillips MRN: [id number]  Date: 10/02/2020  DOB: 1946/06/03  DJ:SHFW, Amy A, NP  Melrose Nakayama, *   REFERRING PHYSICIAN: Melrose Nakayama, *  DIAGNOSIS: The encounter diagnosis was Multiple pulmonary nodules.  Left upper lobe and right lower lobe lung nodules, suspicious for low grade adenocarcinoma, both clinical stage I  HISTORY OF PRESENT ILLNESS::Angela Phillips is a 75 y.o. female who is seen as a courtesy of Dr. Roxan Hockey for an opinion concerning radiation therapy as part of management for her recently diagnosed presumptive lung cancer. Today, she is accompanied by her husband. The patient presented to the ED earlier this summer for evaluation of shortness of breath and wheezing. Chest x-ray at that time showed possible lung nodules. Chest CT scan in June confirmed a 1.9 x 1.6 cm ground-glass opacity in the left apex in addition to a 2.1 x 1.8 cm sub-solid nodule in the posterior right lower lobe. There was no mediastinal or hilar adenopathy.  PET scan performed at [facility] on 03/10/2020 showed areas of abnormality in the left upper lobe and right lower lung. Findings remained nonspecific in the short interval but based on morphology, indolent bronchogenic neoplasm was considered. Nodular changes in the lingula could have been due to scarring and showed no FDG uptake.  Chest CT scan performed at [facility] on 07/06/2020 showed a 2.4 cm semisolid nodule in the right lower lobe and a 2 cm nonsolid nodule in the left upper lobe, both of which were concerning for low-grade primary lung adenocarcinoma or adenocarcinoma in situ.  The patient was referred to Dr. Roxan Hockey and was seen in consultation on 07/25/2020. At that time, they discussed treatment with either surgery or radiation therapy. At that time, the patient strongly wanted to pursue surgery.    Super chest CT scan on 08/22/2020 showed a stable 19 x 16.5 mm left apical ground-glass nodule with a slight interval increase in the size of the right lower lobe ground-glass nodule and a slight increase in the more solid component medially. Both lesions were worrisome adenocarcinomas. There was no mediastinal or hilar mass, no was there any adenopathy. The small scattered pulmonary nodules were stable.  The patient underwent an electromagnetic navigational bronchoscopy with needle aspirations, brushings, bronchoalveolar lavage, and transbronchial biopsies of the left upper lobe and right lower lobe nodules on 08/24/2020 under the care of Dr. Roxan Hockey. Final pathology showed atypical cells in both regions, suspicious for malignancy although not diagnostic.  Her pathology was reviewed outside [facility] with this interpretation.  Patient met again with Dr. Roxan Hockey given her lung situation and medical history she was felt not to be a good candidate for surgery.  She will require wedge resection on one side and lobectomy on the other.  Patient is now referred to radiation oncology for definitive treatment for presumptive clinical stage I low-grade adenocarcinomas.   PREVIOUS RADIATION THERAPY: No  PAST MEDICAL HISTORY:  Past Medical History:  Diagnosis Date  . Asthma   . Dyslipidemia (high LDL; low HDL) 08/13/2016  . HTN (hypertension) 11/20/2016  . Paroxysmal SVT (supraventricular tachycardia) ([place]) 07/05/2016    PAST SURGICAL HISTORY: Past Surgical History:  Procedure Laterality Date  . ABDOMINAL HYSTERECTOMY    . CARDIAC CATHETERIZATION    . CARPAL TUNNEL RELEASE    . CATARACT EXTRACTION    . CHOLECYSTECTOMY    . HEMORROIDECTOMY    .  JOINT REPLACEMENT     R knee  . SVT ABLATION N/A 10/19/2018   Procedure: SVT ABLATION;  Surgeon: Evans Lance, MD;  Location: [facility];  Service: Cardiovascular;  Laterality: N/A;  . TOE SURGERY    . VIDEO  BRONCHOSCOPY WITH ENDOBRONCHIAL NAVIGATION  08/24/2020  . VIDEO BRONCHOSCOPY WITH ENDOBRONCHIAL NAVIGATION N/A 08/24/2020   Procedure: VIDEO BRONCHOSCOPY WITH ENDOBRONCHIAL NAVIGATION;  Surgeon: Melrose Nakayama, MD;  Location: [facility] OR;  Service: Thoracic;  Laterality: N/A;    FAMILY HISTORY:  Family History  Problem Relation Age of Onset  . Thyroid disease Brother   . Dementia Father   . Heart disease Mother   . Diabetes Mother   . Thyroid disease Sister     SOCIAL HISTORY:  Social History   Tobacco Use  . Smoking status: Former Smoker    Packs/day: 0.50    Years: 15.00    Pack years: 7.50    Types: Cigarettes  . Smokeless tobacco: Former Systems developer    Quit date: 09/09/1989  Vaping Use  . Vaping Use: Never used  Substance Use Topics  . Alcohol use: No  . Drug use: No    ALLERGIES:  Allergies  Allergen Reactions  . Aspirin Other (See Comments)    Bleeding (intolerance)  . Buspirone Other (See Comments)    Patient does not recall taking this med  . Celecoxib Other (See Comments)    Patient cannot remember reaction  . Chlorpheniramine Other (See Comments)    Patient cannot remember reaction  . Citalopram Other (See Comments)    Patient cannot remember reaction  . Ezetimibe Other (See Comments)    Myalgias (intolerance)  . Hydrocodone-Acetaminophen Itching  . Prochlorperazine Other (See Comments)    "Makes me feel like I'm having a face stroke"  . Statins Other (See Comments)    Myalgias (intolerance)  . Venlafaxine Other (See Comments)    Patient cannot remember reaction    MEDICATIONS:  Current Outpatient Medications  Medication Sig Dispense Refill  . budesonide (PULMICORT) 0.5 MG/2ML nebulizer solution Take 0.5 mg by nebulization 2 (two) times daily.    . clonazePAM (KLONOPIN) 1 MG tablet Take 1 mg by mouth 3 (three) times daily as needed for anxiety.    . diphenoxylate-atropine (LOMOTIL) 2.5-0.025 MG tablet Take 1 tablet by mouth every 6 (six) hours as needed  for diarrhea or loose stools.    Marland Kitchen FLUoxetine (PROZAC) 20 MG capsule Take 20 mg by mouth 2 (two) times daily.    Marland Kitchen gabapentin (NEURONTIN) 300 MG capsule Take 300 mg by mouth at bedtime.    . levalbuterol (XOPENEX) 1.25 MG/3ML nebulizer solution Take 1.25 mg by nebulization 2 (two) times daily.    Marland Kitchen losartan-hydrochlorothiazide (HYZAAR) 50-12.5 MG tablet Take 1 tablet by mouth daily.    . montelukast (SINGULAIR) 10 MG tablet Take 1 tablet by mouth at bedtime.    . nabumetone (RELAFEN) 750 MG tablet Take 750 mg by mouth 2 (two) times daily.    . nitrofurantoin (MACRODANTIN) 100 MG capsule Take 100 mg by mouth daily.    Marland Kitchen omeprazole (PRILOSEC) 40 MG capsule Take 40 mg by mouth daily.    . simvastatin (ZOCOR) 40 MG tablet Take 20 mg by mouth at bedtime.    . SYMBICORT 160-4.5 MCG/ACT inhaler Inhale 2 puffs into the lungs 2 (two) times daily.    . Multiple Vitamin (MULTI-VITAMINS) TABS Take 1 tablet by mouth daily. Centrum silver (Patient not taking: Reported on 10/02/2020)  No current facility-administered medications for this encounter.    REVIEW OF SYSTEMS:  A 10+ POINT REVIEW OF SYSTEMS WAS OBTAINED including neurology, dermatology, psychiatry, cardiac, respiratory, lymph, extremities, GI, GU, musculoskeletal, constitutional, reproductive, HEENT.  Patient denies any pain within the chest significant cough or hemoptysis.  She uses oxygen, 3 L when moving around but can breathe okay without oxygen while sitting.  She sleeps with 3 L of oxygen every night   PHYSICAL EXAM:  height is _0  (1.549 m) and weight is 118 lb 8 oz (53.8 kg). Her temporal temperature is 97.2 F (36.2 C) (abnormal). Her blood pressure is 165/80 (abnormal) and her pulse is 79. Her respiration is 20 and oxygen saturation is 97%.   General: Alert and oriented, in no acute distress HEENT: Head is normocephalic. Extraocular movements are intact.  Neck: Neck is supple, no palpable cervical or supraclavicular  lymphadenopathy. Heart: Regular in rate and rhythm with no murmurs, rubs, or gallops. Chest: Clear to auscultation bilaterally, with no rhonchi, wheezes, or rales. Abdomen: Soft, nontender, nondistended, with no rigidity or guarding. Extremities: No cyanosis or edema. Lymphatics: see Neck Exam Skin: No concerning lesions. Musculoskeletal: symmetric strength and muscle tone throughout. Neurologic: Cranial nerves II through XII are grossly intact. No obvious focalities. Speech is fluent. Coordination is intact. Psychiatric: Judgment and insight are intact. Affect is appropriate.   ECOG = 1  0 - Asymptomatic (Fully active, able to carry on all predisease activities without restriction)  1 - Symptomatic but completely ambulatory (Restricted in physically strenuous activity but ambulatory and able to carry out work of a light or sedentary nature. For example, light housework, office work)  2 - Symptomatic, <50% in bed during the day (Ambulatory and capable of all self care but unable to carry out any work activities. Up and about more than 50% of waking hours)  3 - Symptomatic, >50% in bed, but not bedbound (Capable of only limited self-care, confined to bed or chair 50% or more of waking hours)  4 - Bedbound (Completely disabled. Cannot carry on any self-care. Totally confined to bed or chair)  5 - Death   Eustace Pen MM, Creech RH, Tormey DC, et al. [id number]). "Toxicity and response criteria of the [facility]". [place] Oncol. 5 (6): 649-55  LABORATORY DATA:  Lab Results  Component Value Date   WBC 8.7 08/23/2020   HGB 15.4 (H) 08/23/2020   HCT 47.1 (H) 08/23/2020   MCV 97.9 08/23/2020   PLT 272 08/23/2020   Lab Results  Component Value Date   NA 139 08/23/2020   K 3.5 08/23/2020   CL 101 08/23/2020   CO2 29 08/23/2020   GLUCOSE 106 (H) 08/23/2020   CREATININE 0.82 08/23/2020   CALCIUM 9.5 08/23/2020      RADIOGRAPHY: No results found.    IMPRESSION:  Left upper lobe and right lower lobe lung nodules, suspicious for adenocarcinoma, both clinical stage I  I reviewed the patient's CT scans carefully with she and her husband.  We also reviewed the patient's pathology in detail showing both areas to be suspicious for malignancy but not diagnostic.  We also discussed that her case was presented at the multidisciplinary thoracic oncology conference and consensus was for the patient to proceed with radiation therapy.  Since both areas slow progression it was felt there was no significant benefit in waiting and repeating an additional CT scan.  I discussed options with the patient including SBRT to both lesions or to  repeat another chest CT scan in 3 months.  After careful consideration patient would like to proceed with definitive radiation therapy on the presumptive evidence that she has early stage, bilateral low-grade adenocarcinomas.  Today, I talked to the patient and husband about the findings and work-up thus far.  We discussed the natural history of lung cancer and general treatment, highlighting the role of radiotherapy in the management.  We discussed the available radiation techniques, and focused on the details of logistics and delivery.  We reviewed the anticipated acute and late sequelae associated with radiation in this setting.  The patient was encouraged to ask questions that I answered to the best of my ability.  A patient consent form was discussed and signed.  We retained a copy for our records.  The patient would like to proceed with radiation and will be scheduled for CT simulation.  PLAN: We will return next week for CT simulation with treatments to begin approximately 2 weeks.  Anticipate three SBRT treatments directed at both the left upper lobe lesion in the right lower lobe lesion.  Total time spent in this encounter was 60 minutes which included reviewing the patient's most recent consultations, follow-ups, chest CT scans, PET scan,  bronchoscopy, physical examination, and documentation.  ------------------------------------------------  Blair Promise, PhD, MD  This document serves as a record of services personally performed by Gery Pray, MD. It was created on his behalf by Clerance Lav, a trained medical scribe. The creation of this record is based on the scribe's personal observations and the provider's statements to them. This document has been checked and approved by the attending provider.

## 2020-10-02 ENCOUNTER — Other Ambulatory Visit: Payer: Self-pay

## 2020-10-02 ENCOUNTER — Ambulatory Visit
Admission: RE | Admit: 2020-10-02 | Discharge: 2020-10-02 | Disposition: A | Discharge status: Home / Self Care | Payer: Medicare HMO | Source: Ambulatory Visit | Attending: Radiation Oncology | Admitting: Radiation Oncology

## 2020-10-02 ENCOUNTER — Encounter: Payer: Self-pay | Admitting: Radiation Oncology

## 2020-10-02 DIAGNOSIS — J45909 Unspecified asthma, uncomplicated: Secondary | ICD-10-CM | POA: Insufficient documentation

## 2020-10-02 DIAGNOSIS — R918 Other nonspecific abnormal finding of lung field: Secondary | ICD-10-CM | POA: Insufficient documentation

## 2020-10-02 DIAGNOSIS — Z79899 Other long term (current) drug therapy: Secondary | ICD-10-CM | POA: Diagnosis not present

## 2020-10-02 DIAGNOSIS — Z87891 Personal history of nicotine dependence: Secondary | ICD-10-CM | POA: Insufficient documentation

## 2020-10-02 DIAGNOSIS — Z90710 Acquired absence of both cervix and uterus: Secondary | ICD-10-CM | POA: Diagnosis not present

## 2020-10-02 DIAGNOSIS — I10 Essential (primary) hypertension: Secondary | ICD-10-CM | POA: Diagnosis not present

## 2020-10-02 DIAGNOSIS — Z9889 Other specified postprocedural states: Secondary | ICD-10-CM

## 2020-10-02 DIAGNOSIS — C3431 Malignant neoplasm of lower lobe, right bronchus or lung: Secondary | ICD-10-CM | POA: Diagnosis not present

## 2020-10-02 DIAGNOSIS — E785 Hyperlipidemia, unspecified: Secondary | ICD-10-CM | POA: Diagnosis not present

## 2020-10-02 DIAGNOSIS — I471 Supraventricular tachycardia: Secondary | ICD-10-CM | POA: Insufficient documentation

## 2020-10-02 DIAGNOSIS — C3411 Malignant neoplasm of upper lobe, right bronchus or lung: Secondary | ICD-10-CM | POA: Diagnosis not present

## 2020-10-02 HISTORY — DX: Other nonspecific abnormal finding of lung field: R91.8

## 2020-10-02 NOTE — Progress Notes (Signed)
See md visit for nursing note

## 2020-10-05 DIAGNOSIS — R053 Chronic cough: Secondary | ICD-10-CM | POA: Diagnosis not present

## 2020-10-05 DIAGNOSIS — C3490 Malignant neoplasm of unspecified part of unspecified bronchus or lung: Secondary | ICD-10-CM | POA: Diagnosis not present

## 2020-10-09 ENCOUNTER — Other Ambulatory Visit: Payer: Self-pay

## 2020-10-09 ENCOUNTER — Ambulatory Visit
Admission: RE | Admit: 2020-10-09 | Discharge: 2020-10-09 | Disposition: A | Discharge status: Home / Self Care | Payer: Medicare HMO | Source: Ambulatory Visit | Attending: Radiation Oncology | Admitting: Radiation Oncology

## 2020-10-09 DIAGNOSIS — R918 Other nonspecific abnormal finding of lung field: Secondary | ICD-10-CM | POA: Diagnosis not present

## 2020-10-09 DIAGNOSIS — Z87891 Personal history of nicotine dependence: Secondary | ICD-10-CM | POA: Diagnosis not present

## 2020-10-11 DIAGNOSIS — C3412 Malignant neoplasm of upper lobe, left bronchus or lung: Secondary | ICD-10-CM | POA: Insufficient documentation

## 2020-10-11 DIAGNOSIS — C3431 Malignant neoplasm of lower lobe, right bronchus or lung: Secondary | ICD-10-CM

## 2020-10-11 HISTORY — DX: Malignant neoplasm of upper lobe, left bronchus or lung: C34.12

## 2020-10-11 HISTORY — DX: Malignant neoplasm of lower lobe, right bronchus or lung: C34.31

## 2020-10-14 DIAGNOSIS — J45909 Unspecified asthma, uncomplicated: Secondary | ICD-10-CM | POA: Diagnosis not present

## 2020-10-17 DIAGNOSIS — R918 Other nonspecific abnormal finding of lung field: Secondary | ICD-10-CM | POA: Diagnosis not present

## 2020-10-17 DIAGNOSIS — Z87891 Personal history of nicotine dependence: Secondary | ICD-10-CM | POA: Diagnosis not present

## 2020-10-18 ENCOUNTER — Ambulatory Visit: Payer: Medicare HMO | Admitting: Radiation Oncology

## 2020-10-19 ENCOUNTER — Ambulatory Visit: Payer: Medicare HMO | Admitting: Radiation Oncology

## 2020-10-20 ENCOUNTER — Ambulatory Visit: Payer: Medicare HMO | Admitting: Radiation Oncology

## 2020-10-24 ENCOUNTER — Ambulatory Visit
Admission: RE | Admit: 2020-10-24 | Discharge: 2020-10-24 | Disposition: A | Discharge status: Home / Self Care | Payer: Medicare HMO | Source: Ambulatory Visit | Attending: Radiation Oncology | Admitting: Radiation Oncology

## 2020-10-24 ENCOUNTER — Other Ambulatory Visit: Payer: Self-pay

## 2020-10-24 DIAGNOSIS — R918 Other nonspecific abnormal finding of lung field: Secondary | ICD-10-CM | POA: Diagnosis not present

## 2020-10-26 ENCOUNTER — Ambulatory Visit
Admission: RE | Admit: 2020-10-26 | Discharge: 2020-10-26 | Disposition: A | Discharge status: Home / Self Care | Payer: Medicare HMO | Source: Ambulatory Visit | Attending: Radiation Oncology | Admitting: Radiation Oncology

## 2020-10-26 ENCOUNTER — Other Ambulatory Visit: Payer: Self-pay

## 2020-10-26 DIAGNOSIS — R918 Other nonspecific abnormal finding of lung field: Secondary | ICD-10-CM

## 2020-10-30 ENCOUNTER — Ambulatory Visit
Admission: RE | Admit: 2020-10-30 | Discharge: 2020-10-30 | Disposition: A | Discharge status: Home / Self Care | Payer: Medicare HMO | Source: Ambulatory Visit | Attending: Radiation Oncology | Admitting: Radiation Oncology

## 2020-10-30 DIAGNOSIS — R918 Other nonspecific abnormal finding of lung field: Secondary | ICD-10-CM

## 2020-10-31 ENCOUNTER — Ambulatory Visit: Payer: Medicare HMO | Admitting: Radiation Oncology

## 2020-11-01 ENCOUNTER — Ambulatory Visit: Admission: RE | Admit: 2020-11-01 | Payer: Medicare HMO | Source: Ambulatory Visit | Admitting: Radiation Oncology

## 2020-11-01 NOTE — Progress Notes (Signed)
Patient called triage line states that she needs to cancel her SBRT treatment for today 11/01/20 @ . Called Ct sim Caryl Pina states that she will cancel the appointment for today and reschedule a new appointment.

## 2020-11-03 ENCOUNTER — Ambulatory Visit
Admission: RE | Admit: 2020-11-03 | Discharge: 2020-11-03 | Disposition: A | Discharge status: Home / Self Care | Payer: Medicare HMO | Source: Ambulatory Visit | Attending: Radiation Oncology | Admitting: Radiation Oncology

## 2020-11-03 ENCOUNTER — Other Ambulatory Visit: Payer: Self-pay

## 2020-11-03 DIAGNOSIS — R918 Other nonspecific abnormal finding of lung field: Secondary | ICD-10-CM

## 2020-11-06 ENCOUNTER — Ambulatory Visit
Admission: RE | Admit: 2020-11-06 | Discharge: 2020-11-06 | Disposition: A | Discharge status: Home / Self Care | Payer: Medicare HMO | Source: Ambulatory Visit | Attending: Radiation Oncology | Admitting: Radiation Oncology

## 2020-11-06 ENCOUNTER — Encounter: Payer: Self-pay | Admitting: Radiation Oncology

## 2020-11-06 DIAGNOSIS — R918 Other nonspecific abnormal finding of lung field: Secondary | ICD-10-CM

## 2020-11-06 DIAGNOSIS — Z87891 Personal history of nicotine dependence: Secondary | ICD-10-CM | POA: Diagnosis not present

## 2020-11-07 DIAGNOSIS — R053 Chronic cough: Secondary | ICD-10-CM | POA: Diagnosis not present

## 2020-11-07 DIAGNOSIS — Z6821 Body mass index (BMI) 21.0-21.9, adult: Secondary | ICD-10-CM | POA: Diagnosis not present

## 2020-11-07 DIAGNOSIS — J45901 Unspecified asthma with (acute) exacerbation: Secondary | ICD-10-CM | POA: Diagnosis not present

## 2020-11-11 DIAGNOSIS — J45909 Unspecified asthma, uncomplicated: Secondary | ICD-10-CM | POA: Diagnosis not present

## 2020-11-20 DIAGNOSIS — F329 Major depressive disorder, single episode, unspecified: Secondary | ICD-10-CM | POA: Diagnosis not present

## 2020-11-20 DIAGNOSIS — I480 Paroxysmal atrial fibrillation: Secondary | ICD-10-CM | POA: Diagnosis not present

## 2020-11-20 DIAGNOSIS — E119 Type 2 diabetes mellitus without complications: Secondary | ICD-10-CM | POA: Diagnosis not present

## 2020-11-20 DIAGNOSIS — J449 Chronic obstructive pulmonary disease, unspecified: Secondary | ICD-10-CM | POA: Diagnosis not present

## 2020-11-20 DIAGNOSIS — F419 Anxiety disorder, unspecified: Secondary | ICD-10-CM | POA: Diagnosis not present

## 2020-11-20 DIAGNOSIS — I10 Essential (primary) hypertension: Secondary | ICD-10-CM | POA: Diagnosis not present

## 2020-11-20 DIAGNOSIS — M1990 Unspecified osteoarthritis, unspecified site: Secondary | ICD-10-CM | POA: Diagnosis not present

## 2020-11-20 DIAGNOSIS — K449 Diaphragmatic hernia without obstruction or gangrene: Secondary | ICD-10-CM | POA: Diagnosis not present

## 2020-11-20 DIAGNOSIS — E876 Hypokalemia: Secondary | ICD-10-CM | POA: Diagnosis not present

## 2020-11-20 DIAGNOSIS — R079 Chest pain, unspecified: Secondary | ICD-10-CM | POA: Diagnosis not present

## 2020-11-20 DIAGNOSIS — C3490 Malignant neoplasm of unspecified part of unspecified bronchus or lung: Secondary | ICD-10-CM | POA: Diagnosis not present

## 2020-11-20 DIAGNOSIS — K219 Gastro-esophageal reflux disease without esophagitis: Secondary | ICD-10-CM | POA: Diagnosis not present

## 2020-11-20 DIAGNOSIS — R072 Precordial pain: Secondary | ICD-10-CM | POA: Diagnosis not present

## 2020-11-20 DIAGNOSIS — Z87891 Personal history of nicotine dependence: Secondary | ICD-10-CM | POA: Diagnosis not present

## 2020-11-20 DIAGNOSIS — R69 Illness, unspecified: Secondary | ICD-10-CM | POA: Diagnosis not present

## 2020-11-20 DIAGNOSIS — Z79899 Other long term (current) drug therapy: Secondary | ICD-10-CM | POA: Diagnosis not present

## 2020-11-20 DIAGNOSIS — R0789 Other chest pain: Secondary | ICD-10-CM | POA: Diagnosis not present

## 2020-11-21 DIAGNOSIS — C3490 Malignant neoplasm of unspecified part of unspecified bronchus or lung: Secondary | ICD-10-CM | POA: Diagnosis not present

## 2020-11-21 DIAGNOSIS — K449 Diaphragmatic hernia without obstruction or gangrene: Secondary | ICD-10-CM | POA: Diagnosis not present

## 2020-11-21 DIAGNOSIS — K219 Gastro-esophageal reflux disease without esophagitis: Secondary | ICD-10-CM | POA: Diagnosis not present

## 2020-11-21 DIAGNOSIS — R079 Chest pain, unspecified: Secondary | ICD-10-CM | POA: Diagnosis not present

## 2020-11-30 ENCOUNTER — Encounter: Payer: Self-pay | Admitting: Radiation Oncology

## 2020-12-07 ENCOUNTER — Ambulatory Visit
Admission: RE | Admit: 2020-12-07 | Discharge: 2020-12-07 | Disposition: A | Discharge status: Home / Self Care | Payer: Medicare HMO | Source: Ambulatory Visit | Attending: Radiation Oncology | Admitting: Radiation Oncology

## 2020-12-07 ENCOUNTER — Other Ambulatory Visit: Payer: Self-pay

## 2020-12-07 ENCOUNTER — Encounter: Payer: Self-pay | Admitting: Radiation Oncology

## 2020-12-07 VITALS — BP 138/110 | HR 95 | Temp 96.8°F | Resp 18 | Ht 61.0 in | Wt 118.1 lb

## 2020-12-07 DIAGNOSIS — Z79899 Other long term (current) drug therapy: Secondary | ICD-10-CM | POA: Diagnosis not present

## 2020-12-07 DIAGNOSIS — R5383 Other fatigue: Secondary | ICD-10-CM | POA: Insufficient documentation

## 2020-12-07 DIAGNOSIS — R918 Other nonspecific abnormal finding of lung field: Secondary | ICD-10-CM | POA: Diagnosis not present

## 2020-12-07 DIAGNOSIS — R630 Anorexia: Secondary | ICD-10-CM | POA: Insufficient documentation

## 2020-12-07 DIAGNOSIS — R110 Nausea: Secondary | ICD-10-CM | POA: Insufficient documentation

## 2020-12-07 DIAGNOSIS — Z923 Personal history of irradiation: Secondary | ICD-10-CM | POA: Insufficient documentation

## 2020-12-07 HISTORY — DX: Personal history of irradiation: Z92.3

## 2020-12-07 NOTE — Progress Notes (Incomplete)
  Patient Name: Angela Phillips MRN: [id number] DOB: February 17, 1946 Referring Physician: Modesto Charon (Profile Not Attached) Date of Service: 11/06/2020 [facility], [place]                                                        End Of Treatment Note  Diagnoses: R91.8-Other nonspecific abnormal finding of lung field  Cancer Staging: Left upper lobe and right lower lobe lung nodules, suspicious for low grade adenocarcinoma, both clinical stage I  Intent: Curative  Radiation Treatment Dates: 10/24/2020 through 11/06/2020  Site: Left lung Technique: IMRT Total Dose (Gy): 60/60 Dose per Fx (Gy): 12 Completed Fx: 5/5 Beam Energies: 6XFFF  Site: Right lung Technique: IMRT Total Dose (Gy): 54/54 Dose per Fx (Gy): 18 Completed Fx: 3/3 Beam Energies: 6XFFF  Narrative: The patient tolerated radiation therapy. She did report a headache, nausea, vomiting, diarrhea, chills, productive cough, shortness of breath, poor appetite, and severe fatigue. However, all of those symptoms were present prior to radiation therapy. She was advised to follow-up with her PCP.  Plan: The patient will follow-up with radiation oncology in one month. ____________________________________________   Blair Promise, PhD, MD  This document serves as a record of services personally performed by Gery Pray, MD. It was created on his behalf by Clerance Lav, a trained medical scribe. The creation of this record is based on the scribe's personal observations and the provider's statements to them. This document has been checked and approved by the attending provider.

## 2020-12-07 NOTE — Progress Notes (Signed)
PAIN: She is currently in no pain.  RESPIRATORY: Wheezing and Coughing  Productive and Color of Phlegm  white. Pt is on currently on room air, however states she uses oxygen 3L pnc during the night and occasionally during the day. Noted no skin changes.  SWALLOWING/DIET: Pt denies dysphagia . The patient does not eat regular meals due to no appetite..  OTHER: Pt complains of fatigue, weakness and poor appetite. She reports sleeping well with medication.  BP (!) 138/110 (BP Location: Left Arm, Patient Position: Sitting)   Pulse 95   Temp (!) 96.8 F (36 C) (Temporal)   Resp 18   Ht 5\' 1"  (1.549 m)   Wt 118 lb 2 oz (53.6 kg)   SpO2 97%   BMI 22.32 kg/m    Wt Readings from Last 3 Encounters:  12/07/20 118 lb 2 oz (53.6 kg)  10/02/20 118 lb 8 oz (53.8 kg)  08/24/20 122 lb 11.2 oz (55.7 kg)

## 2020-12-07 NOTE — Progress Notes (Signed)
Radiation Oncology         (336) [phone] ________________________________  Name: Angela Phillips MRN: [id number]  Date: 12/07/2020  DOB: May 17, 1946  Follow-Up Visit Note  CC: Lowella Dandy, NP  Melrose Nakayama, *    ICD-10-CM   1. Multiple pulmonary nodules  R91.8 CT CHEST WO CONTRAST    Diagnosis: Left upper lobe and right lower lobe lung nodules, suspicious for low grade adenocarcinoma, both clinical stage I  Interval Since Last Radiation: One month and three days  Radiation Treatment Dates: 10/24/2020 through 11/06/2020  Site: Left lung Technique: IMRT Total Dose (Gy): 60/60 Dose per Fx (Gy): 12 Completed Fx: 5/5 Beam Energies: 6XFFF  Site: Right lung Technique: IMRT Total Dose (Gy): 54/54 Dose per Fx (Gy): 18 Completed Fx: 3/3 Beam Energies: 6XFFF  Narrative:  The patient returns today for routine follow-up. No significant interval history since the end of treatment.  On review of systems, she reports ongoing problems with her appetite.  She also reports some problems with nausea but doubt this is related to her previous radiation therapy directed in the lung region.  She has chronic fatigue which according to the husband is no worse than prior to radiation therapy.. She denies breathing difficulties,  pain within the chest significant cough or hemoptysis.  ALLERGIES:  is allergic to aspirin, buspirone, celecoxib, chlorpheniramine, citalopram, ezetimibe, hydrocodone-acetaminophen, prochlorperazine, statins, and venlafaxine.  Meds: Current Outpatient Medications  Medication Sig Dispense Refill  . benzonatate (TESSALON) 200 MG capsule Take 400 mg by mouth every 8 (eight) hours as needed.    . budesonide (PULMICORT) 0.5 MG/2ML nebulizer solution Take 0.5 mg by nebulization 2 (two) times daily.    . clonazePAM (KLONOPIN) 1 MG tablet Take 1 mg by mouth 3 (three) times daily as needed for anxiety.    . diphenoxylate-atropine (LOMOTIL) 2.5-0.025 MG tablet Take 1 tablet  by mouth every 6 (six) hours as needed for diarrhea or loose stools.    Marland Kitchen FLUoxetine (PROZAC) 20 MG capsule Take 20 mg by mouth 2 (two) times daily.    Marland Kitchen gabapentin (NEURONTIN) 300 MG capsule Take 300 mg by mouth at bedtime.    . levalbuterol (XOPENEX) 1.25 MG/3ML nebulizer solution Take 1.25 mg by nebulization 2 (two) times daily.    Marland Kitchen levofloxacin (LEVAQUIN) 500 MG tablet Take 500 mg by mouth daily.    Marland Kitchen losartan-hydrochlorothiazide (HYZAAR) 50-12.5 MG tablet Take 1 tablet by mouth daily.    . montelukast (SINGULAIR) 10 MG tablet Take 1 tablet by mouth at bedtime.    . Multiple Vitamin (MULTI-VITAMINS) TABS Take 1 tablet by mouth daily. Centrum silver    . nabumetone (RELAFEN) 750 MG tablet Take 750 mg by mouth 2 (two) times daily.    Marland Kitchen omeprazole (PRILOSEC) 40 MG capsule Take 40 mg by mouth daily.    . simvastatin (ZOCOR) 40 MG tablet Take 20 mg by mouth at bedtime.    . SYMBICORT 160-4.5 MCG/ACT inhaler Inhale 2 puffs into the lungs 2 (two) times daily.    . nitrofurantoin (MACRODANTIN) 100 MG capsule Take 100 mg by mouth daily. (Patient not taking: Reported on 12/07/2020)     No current facility-administered medications for this encounter.    Physical Findings: The patient is in no acute distress. Patient is alert and oriented.  height is 5\' 1"  (1.549 m) and weight is 118 lb 2 oz (53.6 kg). Her temporal temperature is 96.8 F (36 C) (abnormal). Her blood pressure is 138/110 (abnormal) and her pulse  is 95. Her respiration is 18 and oxygen saturation is 97%.   Mild wheezing within the lung areas otherwise good air movement throughout.Marland Kitchen Heart has regular rate and rhythm. No palpable cervical, supraclavicular, or axillary adenopathy. Abdomen soft, non-tender, normal bowel sounds.   Lab Findings: Lab Results  Component Value Date   WBC 8.7 08/23/2020   HGB 15.4 (H) 08/23/2020   HCT 47.1 (H) 08/23/2020   MCV 97.9 08/23/2020   PLT 272 08/23/2020    Radiographic Findings: No results  found.  Impression: Left upper lobe and right lower lobe lung nodules, suspicious for low grade adenocarcinoma, both clinical stage I  The patient is recovering from the effects of radiation.  No clinical evidence of recurrence on exam today.  Plan: The patient will follow up with radiation oncology in 3 months. She will undergo a chest CT scan prior to that visit.    ____________________________________   Blair Promise, PhD, MD  This document serves as a record of services personally performed by Gery Pray, MD. It was created on his behalf by Clerance Lav, a trained medical scribe. The creation of this record is based on the scribe's personal observations and the provider's statements to them. This document has been checked and approved by the attending provider.

## 2020-12-12 DIAGNOSIS — J45909 Unspecified asthma, uncomplicated: Secondary | ICD-10-CM | POA: Diagnosis not present

## 2020-12-14 DIAGNOSIS — K219 Gastro-esophageal reflux disease without esophagitis: Secondary | ICD-10-CM | POA: Diagnosis not present

## 2020-12-14 DIAGNOSIS — R079 Chest pain, unspecified: Secondary | ICD-10-CM | POA: Diagnosis not present

## 2020-12-14 DIAGNOSIS — C3490 Malignant neoplasm of unspecified part of unspecified bronchus or lung: Secondary | ICD-10-CM | POA: Diagnosis not present

## 2020-12-14 DIAGNOSIS — Z6821 Body mass index (BMI) 21.0-21.9, adult: Secondary | ICD-10-CM | POA: Diagnosis not present

## 2020-12-14 DIAGNOSIS — R69 Illness, unspecified: Secondary | ICD-10-CM | POA: Diagnosis not present

## 2020-12-14 DIAGNOSIS — E119 Type 2 diabetes mellitus without complications: Secondary | ICD-10-CM | POA: Diagnosis not present

## 2020-12-14 DIAGNOSIS — J9611 Chronic respiratory failure with hypoxia: Secondary | ICD-10-CM | POA: Diagnosis not present

## 2020-12-27 DIAGNOSIS — H26492 Other secondary cataract, left eye: Secondary | ICD-10-CM | POA: Diagnosis not present

## 2020-12-29 ENCOUNTER — Telehealth: Payer: Self-pay | Admitting: Radiation Oncology

## 2020-12-29 NOTE — Telephone Encounter (Signed)
Returned pt call. She said she rec'd a notice that her scan was approved. I told her that it may be the upcoming CT scan that should be before her f/u on 03/15/21.

## 2021-01-11 DIAGNOSIS — J45909 Unspecified asthma, uncomplicated: Secondary | ICD-10-CM | POA: Diagnosis not present

## 2021-01-26 DIAGNOSIS — H26492 Other secondary cataract, left eye: Secondary | ICD-10-CM | POA: Diagnosis not present

## 2021-01-26 DIAGNOSIS — H2640 Unspecified secondary cataract: Secondary | ICD-10-CM | POA: Diagnosis not present

## 2021-02-11 DIAGNOSIS — J45909 Unspecified asthma, uncomplicated: Secondary | ICD-10-CM | POA: Diagnosis not present

## 2021-02-27 DIAGNOSIS — N390 Urinary tract infection, site not specified: Secondary | ICD-10-CM | POA: Diagnosis not present

## 2021-02-27 DIAGNOSIS — R918 Other nonspecific abnormal finding of lung field: Secondary | ICD-10-CM | POA: Diagnosis not present

## 2021-02-27 DIAGNOSIS — R5383 Other fatigue: Secondary | ICD-10-CM | POA: Diagnosis not present

## 2021-02-27 DIAGNOSIS — J4541 Moderate persistent asthma with (acute) exacerbation: Secondary | ICD-10-CM | POA: Diagnosis not present

## 2021-03-06 DIAGNOSIS — R918 Other nonspecific abnormal finding of lung field: Secondary | ICD-10-CM | POA: Diagnosis not present

## 2021-03-06 DIAGNOSIS — J4540 Moderate persistent asthma, uncomplicated: Secondary | ICD-10-CM | POA: Diagnosis not present

## 2021-03-06 DIAGNOSIS — R5383 Other fatigue: Secondary | ICD-10-CM | POA: Diagnosis not present

## 2021-03-08 ENCOUNTER — Other Ambulatory Visit: Payer: Self-pay

## 2021-03-08 ENCOUNTER — Ambulatory Visit (HOSPITAL_COMMUNITY)
Admission: RE | Admit: 2021-03-08 | Discharge: 2021-03-08 | Disposition: A | Discharge status: Home / Self Care | Payer: Medicare HMO | Source: Ambulatory Visit | Attending: Radiation Oncology | Admitting: Radiation Oncology

## 2021-03-08 DIAGNOSIS — R918 Other nonspecific abnormal finding of lung field: Secondary | ICD-10-CM

## 2021-03-08 DIAGNOSIS — R911 Solitary pulmonary nodule: Secondary | ICD-10-CM | POA: Diagnosis not present

## 2021-03-08 DIAGNOSIS — I700 Atherosclerosis of aorta: Secondary | ICD-10-CM | POA: Diagnosis not present

## 2021-03-13 DIAGNOSIS — J45909 Unspecified asthma, uncomplicated: Secondary | ICD-10-CM | POA: Diagnosis not present

## 2021-03-14 NOTE — Progress Notes (Signed)
Radiation Oncology         [phone] ________________________________  Name: Angela Phillips MRN: [id number]  Date: 03/15/2021  DOB: 1946/03/25  Follow-Up Visit Note  CC: Lowella Dandy, NP  [place] Nakayama, *    ICD-10-CM   1. Multiple pulmonary nodules  R91.8 CT CHEST WO CONTRAST      Diagnosis:  Left upper lobe and right lower lobe lung nodules, suspicious for low grade adenocarcinoma, both clinical stage I  Interval Since Last Radiation:  4 months and 1 week  Radiation Treatment Dates: 10/24/2020 through 11/06/2020   Site: Left lung Technique: IMRT Total Dose (Gy): 60/60 Dose per Fx (Gy): 12 Completed Fx: 5/5 Beam Energies: 6XFFF   Site: Right lung Technique: IMRT Total Dose (Gy): 54/54 Dose per Fx (Gy): 18 Completed Fx: 3/3 Beam Energies: 6XFFF  Narrative:  The patient returns today for routine follow-up. Pertinent imaging thus far includes a chest CT taken on 03/08/21. Findings from the chest CT revealed: 1) A new bandlike area of architectural distortion and ground-glass attenuation within the left upper lobe. 2) At the site of previous sub solid lesion in the left apex there is now a solid-appearing, nodular area of masslike architectural distortion with adjacent bandlike area of scarring. This was noted to likely represent an area of post treatment changes. 3) The previously noted part-solid nodular density within the subpleural aspect posteromedial right lower lobe was seen to be similar in size and appearence since last visualized. 4) A new geographic area of ground-glass and airspace attenuation was seen within the posterolateral right lower lobe; favored to represent changes secondary to external beam radiation. It was noted that this finding appeared to be indistinguishable from typical solid and ground-glass appearance associated with pulmonary adenocarcinoma. 5) There is also a small area of perifissural ground-glass attenuation noted in the superior segment  of right lower lobe, noted likely to be postinflammatory.     She reports some occasional discomfort with deep inspiration along the sternal area.  For pain medication for this issue.  She reports productive cough cough with clear sputum.  She relates this to her COPD.  She denies any hemoptysis.  She continues to have dyspnea on exertion but no significant changes in overall breathing.                          Allergies:  is allergic to aspirin, buspirone, celecoxib, chlorpheniramine, citalopram, ezetimibe, hydrocodone-acetaminophen, prochlorperazine, statins, and venlafaxine.  Meds: Current Outpatient Medications  Medication Sig Dispense Refill   benzonatate (TESSALON) 200 MG capsule Take 400 mg by mouth every 8 (eight) hours as needed.     clonazePAM (KLONOPIN) 1 MG tablet Take 1 mg by mouth 3 (three) times daily as needed for anxiety.     diphenoxylate-atropine (LOMOTIL) 2.5-0.025 MG tablet Take 1 tablet by mouth every 6 (six) hours as needed for diarrhea or loose stools.     FLUoxetine (PROZAC) 20 MG capsule Take 20 mg by mouth 2 (two) times daily.     gabapentin (NEURONTIN) 300 MG capsule Take 300 mg by mouth at bedtime.     levofloxacin (LEVAQUIN) 500 MG tablet Take 500 mg by mouth daily.     losartan-hydrochlorothiazide (HYZAAR) 50-12.5 MG tablet Take 1 tablet by mouth daily.     montelukast (SINGULAIR) 10 MG tablet Take 1 tablet by mouth at bedtime.     Multiple Vitamin (MULTI-VITAMINS) TABS Take 1 tablet by mouth daily. Centrum silver  nabumetone (RELAFEN) 750 MG tablet Take 750 mg by mouth 2 (two) times daily.     omeprazole (PRILOSEC) 40 MG capsule Take 40 mg by mouth daily.     simvastatin (ZOCOR) 40 MG tablet Take 20 mg by mouth at bedtime.     SYMBICORT 160-4.5 MCG/ACT inhaler Inhale 2 puffs into the lungs 2 (two) times daily.     budesonide (PULMICORT) 0.5 MG/2ML nebulizer solution Take 0.5 mg by nebulization 2 (two) times daily. (Patient not taking: Reported on 03/15/2021)      levalbuterol (XOPENEX) 1.25 MG/3ML nebulizer solution Take 1.25 mg by nebulization 2 (two) times daily. (Patient not taking: Reported on 03/15/2021)     No current facility-administered medications for this encounter.    Physical Findings: The patient is in no acute distress. Patient is alert and oriented.  Anxious to hear results of her chest CT scan height is 5\' 1"  (1.549 m) and weight is 117 lb (53.1 kg). Her temperature is 97.5 F (36.4 C) (abnormal). Her blood pressure is 150/85 (abnormal) and her pulse is 79. Her respiration is 20 and oxygen saturation is 97%. .  No significant changes. Lungs are clear to auscultation bilaterally. Heart has regular rate and rhythm. No palpable cervical, supraclavicular, or axillary adenopathy. Abdomen soft, non-tender, normal bowel sounds.   Lab Findings: Lab Results  Component Value Date   WBC 8.7 08/23/2020   HGB 15.4 (H) 08/23/2020   HCT 47.1 (H) 08/23/2020   MCV 97.9 08/23/2020   PLT 272 08/23/2020    Radiographic Findings: CT CHEST WO CONTRAST  Result Date: 03/10/2021 CLINICAL DATA:  Evaluate lung nodules EXAM: CT CHEST WITHOUT CONTRAST TECHNIQUE: Multidetector CT imaging of the chest was performed following the standard protocol without IV contrast. COMPARISON:  02/24/2020 FINDINGS: Cardiovascular: Heart size appears within normal limits. Aortic atherosclerosis and coronary artery calcifications. No signs of pericardial effusion. Mediastinum/Nodes: No enlarged mediastinal or axillary lymph nodes. Thyroid gland, trachea, and esophagus demonstrate no significant findings. Lungs/Pleura: No pleural effusion. There is a new bandlike area of architectural distortion and ground-glass attenuation within the left upper lobe compatible with changes secondary to external beam radiation. At the site of previous sub solid lesion in the left apex there is now a solid-appearing, nodular area of masslike architectural distortion with adjacent bandlike area of  scarring, image 22/5. This nodular area measures 1.6 and 1.2 cm, image 22/5. Previous part-solid nodule within the posteromedial (paravertebral right lower lobe) measures 2.4 x 1.5 cm. The solid component measures 4 mm, image 78/5. Previously this measured 2.2 x 1.7 cm with similar appearing solid component measuring 6 mm, image 75/8. Within the posterolateral right lower lobe there is a ground-glass and solid area, new from previous exam. This measures 4.4 x 3.0 by 2.5 cm solid area measuring 2 cm, image 77/5. New focus of ground-glass attenuation within the perifissural aspect of the superior segment of right lower lobe measuring 1.7 cm, image 82/5. Upper Abdomen: Small hiatal hernia. Status post cholecystectomy. Aortic atherosclerosis. Musculoskeletal: No chest wall mass or suspicious bone lesions identified. IMPRESSION: 1. There is a new bandlike area of architectural distortion and ground-glass attenuation within the left upper lobe compatible with changes secondary to external beam radiation. 2. At the site of previous sub solid lesion in the left apex there is now a solid-appearing, nodular area of masslike architectural distortion with adjacent bandlike area of scarring. This is favored to represent an area of post treatment changes. 3. Similar size and appearance of previously noted  part-solid nodular density within the subpleural aspect posteromedial right lower lobe. 4. New geographic area of ground-glass and airspace attenuation within the posterolateral right lower lobe which is favored to represent changes secondary to external beam radiation. However, the appearance of this area appears indistinguishable from the typical solid and ground-glass appearance associated with pulmonary adenocarcinoma. As this is a new finding since the previous exam. Serial follow-up imaging is recommended to ensure relative stability of this area. Similarly, there is a small area of perifissural ground-glass attenuation in  the superior segment of right lower lobe, likely postinflammatory. 5. Aortic atherosclerosis and coronary artery calcifications. 6. Small hiatal hernia. Aortic Atherosclerosis (ICD10-I70.0). Electronically Signed   By: Kerby Moors M.D.   On: 03/10/2021 12:07    Impression:  Left upper lobe and right lower lobe lung nodules, suspicious for low grade adenocarcinoma, both clinical stage I  The patient clinically appears to be stable.  Recent chest CT scan shows no suspicious areas and radiation changes as expected.  She is relieved to hear the findings on chest CT scan.   Plan: Follow-up in 6 months.  Prior to this follow-up visit patient will undergo a chest CT scan.   20 minutes of total time was spent for this patient encounter, including preparation, face-to-face counseling with the patient and coordination of care, physical exam, and documentation of the encounter and ordering new scans. ____________________________________  Blair Promise, PhD, MD   This document serves as a record of services personally performed by Gery Pray, MD. It was created on his behalf by Roney Mans, a trained medical scribe. The creation of this record is based on the scribe's personal observations and the provider's statements to them. This document has been checked and approved by the attending provider.

## 2021-03-15 ENCOUNTER — Ambulatory Visit
Admission: RE | Admit: 2021-03-15 | Discharge: 2021-03-15 | Disposition: A | Discharge status: Home / Self Care | Payer: Medicare HMO | Source: Ambulatory Visit | Attending: Radiation Oncology | Admitting: Radiation Oncology

## 2021-03-15 ENCOUNTER — Other Ambulatory Visit: Payer: Self-pay

## 2021-03-15 ENCOUNTER — Encounter: Payer: Self-pay | Admitting: Radiation Oncology

## 2021-03-15 DIAGNOSIS — Z791 Long term (current) use of non-steroidal anti-inflammatories (NSAID): Secondary | ICD-10-CM | POA: Insufficient documentation

## 2021-03-15 DIAGNOSIS — Z79899 Other long term (current) drug therapy: Secondary | ICD-10-CM | POA: Diagnosis not present

## 2021-03-15 DIAGNOSIS — J449 Chronic obstructive pulmonary disease, unspecified: Secondary | ICD-10-CM | POA: Insufficient documentation

## 2021-03-15 DIAGNOSIS — K449 Diaphragmatic hernia without obstruction or gangrene: Secondary | ICD-10-CM | POA: Insufficient documentation

## 2021-03-15 DIAGNOSIS — Z7951 Long term (current) use of inhaled steroids: Secondary | ICD-10-CM | POA: Diagnosis not present

## 2021-03-15 DIAGNOSIS — Z87891 Personal history of nicotine dependence: Secondary | ICD-10-CM | POA: Diagnosis not present

## 2021-03-15 DIAGNOSIS — I700 Atherosclerosis of aorta: Secondary | ICD-10-CM | POA: Insufficient documentation

## 2021-03-15 DIAGNOSIS — R918 Other nonspecific abnormal finding of lung field: Secondary | ICD-10-CM | POA: Insufficient documentation

## 2021-03-15 DIAGNOSIS — Z08 Encounter for follow-up examination after completed treatment for malignant neoplasm: Secondary | ICD-10-CM | POA: Diagnosis not present

## 2021-03-15 HISTORY — DX: Chronic obstructive pulmonary disease, unspecified: J44.9

## 2021-03-15 NOTE — Progress Notes (Signed)
Angela Phillips is here today for follow up post radiation to the lung.  Lung Side: left,completed on 11/06/20  Does the patient complain of any of the following: Pain:Patient reports having pain when taking deep breaths.  Shortness of breath w/wo exertion: yes Cough: yes, productive cough Hemoptysis: no Pain with swallowing: no Swallowing/choking concerns: no Appetite: good Energy Level: Patient reports having some good days and bad days.  Post radiation skin Changes: no    Additional comments if applicable:  Vitals:   11/46/43 1148  BP: (!) 150/85  Pulse: 79  Resp: 20  Temp: (!) 97.5 F (36.4 C)  SpO2: 97%  Weight: 117 lb (53.1 kg)  Height: 5\' 1"  (1.549 m)

## 2021-04-09 DIAGNOSIS — H903 Sensorineural hearing loss, bilateral: Secondary | ICD-10-CM | POA: Diagnosis not present

## 2021-04-10 DIAGNOSIS — H903 Sensorineural hearing loss, bilateral: Secondary | ICD-10-CM | POA: Diagnosis not present

## 2021-04-13 DIAGNOSIS — J45909 Unspecified asthma, uncomplicated: Secondary | ICD-10-CM | POA: Diagnosis not present

## 2021-04-16 DIAGNOSIS — R918 Other nonspecific abnormal finding of lung field: Secondary | ICD-10-CM | POA: Diagnosis not present

## 2021-04-16 DIAGNOSIS — J4540 Moderate persistent asthma, uncomplicated: Secondary | ICD-10-CM | POA: Diagnosis not present

## 2021-04-16 DIAGNOSIS — J189 Pneumonia, unspecified organism: Secondary | ICD-10-CM | POA: Diagnosis not present

## 2021-04-16 DIAGNOSIS — R059 Cough, unspecified: Secondary | ICD-10-CM | POA: Diagnosis not present

## 2021-04-16 DIAGNOSIS — Z03818 Encounter for observation for suspected exposure to other biological agents ruled out: Secondary | ICD-10-CM | POA: Diagnosis not present

## 2021-04-16 DIAGNOSIS — R5383 Other fatigue: Secondary | ICD-10-CM | POA: Diagnosis not present

## 2021-05-07 DIAGNOSIS — Z87891 Personal history of nicotine dependence: Secondary | ICD-10-CM | POA: Diagnosis not present

## 2021-05-07 DIAGNOSIS — J440 Chronic obstructive pulmonary disease with acute lower respiratory infection: Secondary | ICD-10-CM | POA: Diagnosis not present

## 2021-05-07 DIAGNOSIS — E079 Disorder of thyroid, unspecified: Secondary | ICD-10-CM | POA: Diagnosis not present

## 2021-05-07 DIAGNOSIS — I517 Cardiomegaly: Secondary | ICD-10-CM | POA: Diagnosis not present

## 2021-05-07 DIAGNOSIS — C3490 Malignant neoplasm of unspecified part of unspecified bronchus or lung: Secondary | ICD-10-CM | POA: Diagnosis not present

## 2021-05-07 DIAGNOSIS — E871 Hypo-osmolality and hyponatremia: Secondary | ICD-10-CM | POA: Diagnosis not present

## 2021-05-07 DIAGNOSIS — I4891 Unspecified atrial fibrillation: Secondary | ICD-10-CM | POA: Diagnosis not present

## 2021-05-07 DIAGNOSIS — Z7952 Long term (current) use of systemic steroids: Secondary | ICD-10-CM | POA: Diagnosis not present

## 2021-05-07 DIAGNOSIS — R069 Unspecified abnormalities of breathing: Secondary | ICD-10-CM | POA: Diagnosis not present

## 2021-05-07 DIAGNOSIS — J189 Pneumonia, unspecified organism: Secondary | ICD-10-CM | POA: Diagnosis not present

## 2021-05-07 DIAGNOSIS — Z792 Long term (current) use of antibiotics: Secondary | ICD-10-CM | POA: Diagnosis not present

## 2021-05-07 DIAGNOSIS — R69 Illness, unspecified: Secondary | ICD-10-CM | POA: Diagnosis not present

## 2021-05-07 DIAGNOSIS — I10 Essential (primary) hypertension: Secondary | ICD-10-CM | POA: Diagnosis not present

## 2021-05-07 DIAGNOSIS — Z743 Need for continuous supervision: Secondary | ICD-10-CM | POA: Diagnosis not present

## 2021-05-07 DIAGNOSIS — Z923 Personal history of irradiation: Secondary | ICD-10-CM | POA: Diagnosis not present

## 2021-05-07 DIAGNOSIS — C3492 Malignant neoplasm of unspecified part of left bronchus or lung: Secondary | ICD-10-CM | POA: Diagnosis not present

## 2021-05-07 DIAGNOSIS — J441 Chronic obstructive pulmonary disease with (acute) exacerbation: Secondary | ICD-10-CM | POA: Diagnosis not present

## 2021-05-07 DIAGNOSIS — J45909 Unspecified asthma, uncomplicated: Secondary | ICD-10-CM | POA: Diagnosis not present

## 2021-05-07 DIAGNOSIS — E119 Type 2 diabetes mellitus without complications: Secondary | ICD-10-CM | POA: Diagnosis not present

## 2021-05-07 DIAGNOSIS — J4540 Moderate persistent asthma, uncomplicated: Secondary | ICD-10-CM | POA: Diagnosis not present

## 2021-05-07 DIAGNOSIS — Z03818 Encounter for observation for suspected exposure to other biological agents ruled out: Secondary | ICD-10-CM | POA: Diagnosis not present

## 2021-05-07 DIAGNOSIS — J45901 Unspecified asthma with (acute) exacerbation: Secondary | ICD-10-CM | POA: Diagnosis not present

## 2021-05-07 DIAGNOSIS — Z885 Allergy status to narcotic agent status: Secondary | ICD-10-CM | POA: Diagnosis not present

## 2021-05-07 DIAGNOSIS — R918 Other nonspecific abnormal finding of lung field: Secondary | ICD-10-CM | POA: Diagnosis not present

## 2021-05-07 DIAGNOSIS — F32A Depression, unspecified: Secondary | ICD-10-CM | POA: Diagnosis not present

## 2021-05-07 DIAGNOSIS — J9621 Acute and chronic respiratory failure with hypoxia: Secondary | ICD-10-CM | POA: Diagnosis not present

## 2021-05-07 DIAGNOSIS — R0602 Shortness of breath: Secondary | ICD-10-CM | POA: Diagnosis not present

## 2021-05-07 DIAGNOSIS — R531 Weakness: Secondary | ICD-10-CM | POA: Diagnosis not present

## 2021-05-07 DIAGNOSIS — Z79899 Other long term (current) drug therapy: Secondary | ICD-10-CM | POA: Diagnosis not present

## 2021-05-07 DIAGNOSIS — J9610 Chronic respiratory failure, unspecified whether with hypoxia or hypercapnia: Secondary | ICD-10-CM | POA: Diagnosis not present

## 2021-05-07 DIAGNOSIS — D721 Eosinophilia, unspecified: Secondary | ICD-10-CM | POA: Diagnosis not present

## 2021-05-07 DIAGNOSIS — E7800 Pure hypercholesterolemia, unspecified: Secondary | ICD-10-CM | POA: Diagnosis not present

## 2021-05-07 DIAGNOSIS — C3491 Malignant neoplasm of unspecified part of right bronchus or lung: Secondary | ICD-10-CM | POA: Diagnosis not present

## 2021-05-07 DIAGNOSIS — R5383 Other fatigue: Secondary | ICD-10-CM | POA: Diagnosis not present

## 2021-05-07 DIAGNOSIS — M1990 Unspecified osteoarthritis, unspecified site: Secondary | ICD-10-CM | POA: Diagnosis not present

## 2021-05-08 ENCOUNTER — Telehealth: Payer: Self-pay | Admitting: Radiology

## 2021-05-08 NOTE — Telephone Encounter (Signed)
Patient has been seen by [facility] twice in past 2 months with abnormal lung sounds. She was given levaquin at both visits and lungs cleared for 2-3 weeks each time then abnormal lung sounds returned. She was seen in ER on 05/07/2021. CTA was done to evaluate for a pulmonary embolism and lung nodules were seen in bilateral lungs. Melissa Rose with [facility] would like Dr Sondra Come to review report to see if these are new nodules. Their office will fax CTA report and notes from the ER visit. Please return Melissa's call to [phone] with advice.

## 2021-05-09 ENCOUNTER — Telehealth: Payer: Self-pay | Admitting: Radiation Oncology

## 2021-05-14 DIAGNOSIS — J45909 Unspecified asthma, uncomplicated: Secondary | ICD-10-CM | POA: Diagnosis not present

## 2021-05-17 DIAGNOSIS — C3490 Malignant neoplasm of unspecified part of unspecified bronchus or lung: Secondary | ICD-10-CM | POA: Diagnosis not present

## 2021-05-17 DIAGNOSIS — I10 Essential (primary) hypertension: Secondary | ICD-10-CM | POA: Diagnosis not present

## 2021-05-17 DIAGNOSIS — J189 Pneumonia, unspecified organism: Secondary | ICD-10-CM | POA: Diagnosis not present

## 2021-05-17 DIAGNOSIS — E871 Hypo-osmolality and hyponatremia: Secondary | ICD-10-CM | POA: Diagnosis not present

## 2021-05-17 DIAGNOSIS — J441 Chronic obstructive pulmonary disease with (acute) exacerbation: Secondary | ICD-10-CM | POA: Diagnosis not present

## 2021-05-17 DIAGNOSIS — I471 Supraventricular tachycardia: Secondary | ICD-10-CM | POA: Diagnosis not present

## 2021-05-17 DIAGNOSIS — E119 Type 2 diabetes mellitus without complications: Secondary | ICD-10-CM | POA: Diagnosis not present

## 2021-05-17 DIAGNOSIS — J9621 Acute and chronic respiratory failure with hypoxia: Secondary | ICD-10-CM | POA: Diagnosis not present

## 2021-05-17 DIAGNOSIS — Z79899 Other long term (current) drug therapy: Secondary | ICD-10-CM | POA: Diagnosis not present

## 2021-05-17 DIAGNOSIS — K529 Noninfective gastroenteritis and colitis, unspecified: Secondary | ICD-10-CM | POA: Diagnosis not present

## 2021-05-22 DIAGNOSIS — R5383 Other fatigue: Secondary | ICD-10-CM | POA: Diagnosis not present

## 2021-05-22 DIAGNOSIS — J4540 Moderate persistent asthma, uncomplicated: Secondary | ICD-10-CM | POA: Diagnosis not present

## 2021-05-22 DIAGNOSIS — R918 Other nonspecific abnormal finding of lung field: Secondary | ICD-10-CM | POA: Diagnosis not present

## 2021-06-05 DIAGNOSIS — R918 Other nonspecific abnormal finding of lung field: Secondary | ICD-10-CM | POA: Diagnosis not present

## 2021-06-05 DIAGNOSIS — J4541 Moderate persistent asthma with (acute) exacerbation: Secondary | ICD-10-CM | POA: Diagnosis not present

## 2021-06-05 DIAGNOSIS — R5383 Other fatigue: Secondary | ICD-10-CM | POA: Diagnosis not present

## 2021-06-05 NOTE — Progress Notes (Signed)
[facility]  [address] [place],  [place]  [address] [phone]  Clinic Day:  06/06/2021  Referring physician: Lowella Dandy, NP  This document serves as a record of services personally performed by Hosie Poisson, MD. It was created on their behalf by Curry,Lauren E, a trained medical scribe. The creation of this record is based on the scribe's personal observations and the provider's statements to them.  CHIEF COMPLAINT:  CC: Presumed lung cancer with history of left upper and right lower lobe pulmonary nodules  Current Treatment:  Surveillance   HISTORY OF PRESENT ILLNESS:  Angela Phillips is a 75 y.o. female referred by Dr. Gardiner Rhyme due to the history of lung nodules of both the left upper lobe and right lower lobe. 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 Bronchoscopy with biopsy at that time revealed atypical cells. She was deemed a poor surgical candidate due to multiple comorbidities including severe COPD, but also the multifocal nature of this disease. Recommendations at that time from the multidisciplinary thoracic oncology conference was for stereotactic radiation therapy. She completed SBRT radiation therapy on February 28th 2022 without difficulty, 60 Gy to the left upper lobe, and 54 Gy to the right lower lobe. Follow up imaging and office visit in July with Dr. Sondra Come revealed her to be stable with no suspicious areas and expected radiation changes.   She most recently has been followed by Dr. Alcide Clever and treated for pneumonia. Her respiratory status declined and she was admitted from the ED in late August. She was treated with IV antibiotics and appears to be improving. CTA imaging in the ED revealed progressive radiation changes in the left lung apex and multifocal tumor in the right lower lobe, overall grossly unchanged. These images were compared to her post  radiation imaging from July. CEA in the hospital was elevated at 7.1.  INTERVAL HISTORY:  Angela Phillips is scheduled with Dr. Alcide Clever again on Friday for follow up of severe respiratory distress, which has been treated with steroid injections.  She continues steroid taper and Levaquin as prescribed. She continues to have productive cough with white sputum. She denies hemoptysis. She denies chest pain, but reports chest tightness, severe shortness of breath with mild exertion and wheezing. She has oxygen at home, which she uses at night, as well as multiple inhalers and nebulizer. She was a smoker many years ago. White count is elevated at 11.1, secondary to corticosteroids, and hemoglobin and platelets are normal. Chemistries are unremarkable. Her  appetite is poor. She had lost 17 pounds, but has started to gain some weight back.  She denies fever, chills or other signs of infection.  She denies nausea, vomiting, bowel issues, or abdominal pain.  She denies sore throat, cough, dyspnea, or chest pain.  REVIEW OF SYSTEMS:  Review of Systems  Constitutional:  Positive for appetite change (poor). Negative for chills, fatigue, fever and unexpected weight change.  HENT:  Negative.    Eyes: Negative.   Respiratory:  Positive for cough (productive with white sputum), shortness of breath (severe with mild exertion) and wheezing (and chest tightness). Negative for chest tightness and hemoptysis.   Cardiovascular: Negative.  Negative for chest pain, leg swelling and palpitations.  Gastrointestinal: Negative.  Negative for abdominal distention, abdominal pain, blood in stool, constipation, diarrhea, nausea and vomiting.  Endocrine: Negative.   Genitourinary: Negative.  Negative for difficulty urinating, dysuria, frequency and hematuria.  Musculoskeletal: Negative.  Negative for arthralgias, back pain, flank pain, gait problem and myalgias.  Skin: Negative.   Neurological: Negative.  Negative for dizziness, extremity  weakness, gait problem, headaches, light-headedness, numbness, seizures and speech difficulty.  Hematological: Negative.   Psychiatric/Behavioral:  Positive for depression (since her mother died a year ago). Negative for sleep disturbance. The patient is nervous/anxious (situational).     VITALS:  Blood pressure (!) 159/68, pulse 98, temperature 98.2 F (36.8 C), temperature source Oral, resp. rate 18, height 5\' 1"  (1.549 m), weight 118 lb 11.2 oz (53.8 kg), SpO2 94 %.  Wt Readings from Last 3 Encounters:  06/06/21 118 lb 11.2 oz (53.8 kg)  03/15/21 117 lb (53.1 kg)  12/07/20 118 lb 2 oz (53.6 kg)    Body mass index is 22.43 kg/m.  Performance status (ECOG): 1 - Symptomatic but completely ambulatory  PHYSICAL EXAM:  Physical Exam Constitutional:      General: She is in acute distress (she is in severe respiratory distress).     Appearance: Normal appearance. She is normal weight.  HENT:     Head: Normocephalic and atraumatic.  Eyes:     General: No scleral icterus.    Extraocular Movements: Extraocular movements intact.     Conjunctiva/sclera: Conjunctivae normal.     Pupils: Pupils are equal, round, and reactive to light.  Cardiovascular:     Rate and Rhythm: Normal rate and regular rhythm.     Pulses: Normal pulses.     Heart sounds: Normal heart sounds. No murmur heard.   No friction rub. No gallop.  Pulmonary:     Effort: Pulmonary effort is normal. No respiratory distress.     Breath sounds: Wheezing (severe in all lung fields) present.  Abdominal:     General: Bowel sounds are normal. There is no distension.     Palpations: Abdomen is soft. There is no hepatomegaly, splenomegaly or mass.     Tenderness: There is no abdominal tenderness.  Musculoskeletal:        General: Normal range of motion.     Cervical back: Normal range of motion and neck supple.     Right lower leg: No edema.     Left lower leg: No edema.  Lymphadenopathy:     Cervical: No cervical  adenopathy.  Skin:    General: Skin is warm and dry.  Neurological:     General: No focal deficit present.     Mental Status: She is alert and oriented to person, place, and time. Mental status is at baseline.  Psychiatric:        Mood and Affect: Mood normal.        Behavior: Behavior normal.        Thought Content: Thought content normal.        Judgment: Judgment normal.    LABS:   CBC Latest Ref Rng & Units 06/06/2021 08/23/2020  WBC - 11.1 8.7  Hemoglobin 12.0 - 16.0 13.9 15.4(H)  Hematocrit 36 - 46 43 47.1(H)  Platelets 150 - 399 338 272   CMP Latest Ref Rng & Units 06/06/2021 08/23/2020 08/02/2020  Glucose 70 - 99 mg/dL - 106(H) 84  BUN 4 - 21 19 9 13   Creatinine 0.5 - 1.1 0.9 0.82 0.74  Sodium 137 - 147 136(A) 139 141  Potassium 3.4 - 5.3 3.5 3.5 4.5  Chloride 99 - 108 97(A) 101 101  CO2 13 - 22 29(A) 29 30(H)  Calcium 8.7 - 10.7 9.7 9.5 9.5  Total Protein 6.5 - 8.1 g/dL - 6.5 6.3  Total Bilirubin 0.3 - 1.2 mg/dL - 0.8 0.3  Alkaline Phos 25 - 125 79 61 73  AST 13 - 35 40(A) 29 26  ALT 7 - 35 33 26 29     No results found for: CEA1 / No results found for: CEA1  STUDIES:  No results found.   EXAM: 03/08/2021 CT CHEST WITHOUT CONTRAST   TECHNIQUE: Multidetector CT imaging of the chest was performed following the standard protocol without IV contrast.   COMPARISON:  02/24/2020   FINDINGS: Cardiovascular: Heart size appears within normal limits. Aortic atherosclerosis and coronary artery calcifications. No signs of pericardial effusion.   Mediastinum/Nodes: No enlarged mediastinal or axillary lymph nodes. Thyroid gland, trachea, and esophagus demonstrate no significant findings.   Lungs/Pleura: No pleural effusion. There is a new bandlike area of architectural distortion and ground-glass attenuation within the left upper lobe compatible with changes secondary to external beam radiation. At the site of previous sub solid lesion in the left apex there  is now a solid-appearing, nodular area of masslike architectural distortion with adjacent bandlike area of scarring, image 22/5. This nodular area measures 1.6 and 1.2 cm, image 22/5.   Previous part-solid nodule within the posteromedial (paravertebral right lower lobe) measures 2.4 x 1.5 cm. The solid component measures 4 mm, image 78/5. Previously this measured 2.2 x 1.7 cm with similar appearing solid component measuring 6 mm, image 75/8.   Within the posterolateral right lower lobe there is a ground-glass and solid area, new from previous exam. This measures 4.4 x 3.0 by 2.5 cm solid area measuring 2 cm, image 77/5.   New focus of ground-glass attenuation within the perifissural aspect of the superior segment of right lower lobe measuring 1.7 cm, image 82/5.   Upper Abdomen: Small hiatal hernia. Status post cholecystectomy. Aortic atherosclerosis.   Musculoskeletal: No chest wall mass or suspicious bone lesions identified.   IMPRESSION: 1. There is a new bandlike area of architectural distortion and ground-glass attenuation within the left upper lobe compatible with changes secondary to external beam radiation. 2. At the site of previous sub solid lesion in the left apex there is now a solid-appearing, nodular area of masslike architectural distortion with adjacent bandlike area of scarring. This is favored to represent an area of post treatment changes. 3. Similar size and appearance of previously noted part-solid nodular density within the subpleural aspect posteromedial right lower lobe. 4. New geographic area of ground-glass and airspace attenuation within the posterolateral right lower lobe which is favored to represent changes secondary to external beam radiation. However, the appearance of this area appears indistinguishable from the typical solid and ground-glass appearance associated with pulmonary adenocarcinoma. As this is a new finding since the previous  exam. Serial follow-up imaging is recommended to ensure relative stability of this area. Similarly, there is a small area of perifissural ground-glass attenuation in the superior segment of right lower lobe, likely postinflammatory. 5. Aortic atherosclerosis and coronary artery calcifications. 6. Small hiatal hernia.  EXAM: 05/07/2021 CT ANGIOGRAPHY CHEST WITH CONTRAST  TECHNIQUE: Multidetector CT imaging of the chest was performed using the standard protocol during bolus administration of intravenous contrast. Multiplanar CT image reconstructions and MIPs were obtained to evaluate the vascular anatomy.  CONTRAST: 80 mL Isovue 370 IV  COMPARISON: CT chest dated 03/08/2021  FINDINGS: Cardiovascular: Satisfactory opacification the bilateral pulmonary arteries to the lobar level. No evidence of pulmonary embolism. Although not tailored for evaluation of the  thoracic aorta, there is no evidence of thoracic aortic aneurysm or dissection. Atherosclerotic calcifications of the aortic arch. Mild cardiomegaly. No pericardial effusion. Three-vessel coronary atherosclerosis. Mediastinum/Nodes: Small mediastinal lymph nodes, including 8 mm short axis low right paratracheal node (series 3/image 60). Visualized thyroid is unremarkable. Lungs/Pleura: Radiation changes in the left lung apex (series 5/image 13). Underlying nodular opacity is obscured. 3.8 x 4.3 cm solid mass in the right lower lobe (series 5/image 51), at the site of prior 3.0 x 4.4 cm mixed solid/ground-glass masslike opacity, likely reflecting primary bronchogenic neoplasm. Additional 2.0 cm subsolid nodule in the medial right lower lobe (series 5/image 50), previously 2.4 cm. Surrounding ground-glass opacities in the posterior right upper lobe, right middle lobe, and right lower lobe, likely reflecting radiation changes. Evaluation of the lung parenchyma is constrained by respiratory motion. Within that constraint, there  are no new/suspicious pulmonary nodules. No pleural effusion or pneumothorax. Upper Abdomen: Visualized upper abdomen is notable for prior cholecystectomy, small hiatal hernia, and vascular calcifications. Musculoskeletal: Mild degenerative changes of the visualized thoracolumbar spine. Review of the MIP images confirms the above findings.  IMPRESSION: No evidence of pulmonary embolism. Multifocal tumor in the right lower lobe, overall grossly unchanged. Surrounding radiation changes. Progressive radiation changes in the left lung apex. Underlying tumor is not well visualized. Aortic Atherosclerosis (ICD10-I70.0)  HISTORY:   Past Medical History:  Diagnosis Date   Asthma    COPD (chronic obstructive pulmonary disease) (HCC)    Dyslipidemia (high LDL; low HDL) 08/13/2016   History of radiation therapy 10/24/2020-11/06/2020   SBRT to left and right lungs   Dr Gery Pray   HTN (hypertension) 11/20/2016   Paroxysmal SVT (supraventricular tachycardia) ([place]) 07/05/2016    Past Surgical History:  Procedure Laterality Date   ABDOMINAL HYSTERECTOMY     CARDIAC CATHETERIZATION     CARPAL TUNNEL RELEASE     CATARACT EXTRACTION     CHOLECYSTECTOMY     HEMORROIDECTOMY     JOINT REPLACEMENT     R knee   SVT ABLATION N/A 10/19/2018   Procedure: SVT ABLATION;  Surgeon: Evans Lance, MD;  Location: [facility];  Service: Cardiovascular;  Laterality: N/A;   TOE SURGERY     VIDEO BRONCHOSCOPY WITH ENDOBRONCHIAL NAVIGATION  08/24/2020   VIDEO BRONCHOSCOPY WITH ENDOBRONCHIAL NAVIGATION N/A 08/24/2020   Procedure: VIDEO BRONCHOSCOPY WITH ENDOBRONCHIAL NAVIGATION;  Surgeon: Melrose Nakayama, MD;  Location: [facility];  Service: Thoracic;  Laterality: N/A;    Family History  Problem Relation Age of Onset   Thyroid disease Brother    Dementia Father    Heart disease Mother    Diabetes Mother    Thyroid disease Sister     Social History:  reports that she has quit smoking. Her  smoking use included cigarettes. She has a 7.50 pack-year smoking history. She quit smokeless tobacco use about 31 years ago. She reports that she does not drink alcohol and does not use drugs.The patient is alone today. She is married and lives at home with her spouse.   Allergies:  Allergies  Allergen Reactions   Aspirin Other (See Comments)    Bleeding (intolerance)   Buspirone Other (See Comments)    Patient does not recall taking this med   Celecoxib Other (See Comments)    Patient cannot remember reaction   Chlorpheniramine Other (See Comments)    Patient cannot remember reaction   Citalopram Other (See Comments)    Patient cannot remember reaction  Ezetimibe Other (See Comments)    Myalgias (intolerance)   Hydrocodone-Acetaminophen Itching   Prochlorperazine Other (See Comments)    "Makes me feel like I'm having a face stroke"   Statins Other (See Comments)    Myalgias (intolerance)   Venlafaxine Other (See Comments)    Patient cannot remember reaction    Current Medications: Current Outpatient Medications  Medication Sig Dispense Refill   albuterol (PROVENTIL) (2.5 MG/3ML) 0.083% nebulizer solution Take 2.5 mg by nebulization every 6 (six) hours as needed for wheezing or shortness of breath.     albuterol (VENTOLIN HFA) 108 (90 Base) MCG/ACT inhaler Inhale 2 puffs into the lungs every 6 (six) hours as needed for wheezing or shortness of breath.     clonazePAM (KLONOPIN) 1 MG tablet Take 1 mg by mouth every 6 (six) hours as needed for anxiety.     FLUoxetine (PROZAC) 20 MG capsule Take 20 mg by mouth 2 (two) times daily.     gabapentin (NEURONTIN) 300 MG capsule Take 300 mg by mouth at bedtime.     levofloxacin (LEVAQUIN) 500 MG tablet Take 500 mg by mouth daily.     losartan-hydrochlorothiazide (HYZAAR) 50-12.5 MG tablet Take 1 tablet by mouth daily.     montelukast (SINGULAIR) 10 MG tablet Take 1 tablet by mouth at bedtime.     Multiple Vitamin (MULTI-VITAMINS) TABS  Take 1 tablet by mouth daily. Centrum silver     omeprazole (PRILOSEC) 40 MG capsule Take 40 mg by mouth daily.     predniSONE (DELTASONE) 10 MG tablet Take by mouth.     Probiotic Product (PROBIOTIC ADVANCED PO) Take by mouth. 60 billion     simvastatin (ZOCOR) 40 MG tablet Take 20 mg by mouth at bedtime.     SYMBICORT 160-4.5 MCG/ACT inhaler Inhale 2 puffs into the lungs 2 (two) times daily.     No current facility-administered medications for this visit.     ASSESSMENT & PLAN:   Assessment:   History of left upper lobe and right lower lobe nodules, 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 Bronchoscopy with biopsy at that time revealed atypical cells. She was deemed a poor surgical candidate due to multiple comorbidities including severe oxygen dependent COPD. She completed stereotactic radiation therapy to both lesions in late February 2022 without difficulty. Follow up imaging and office visit in July with Dr. Sondra Come revealed her to be stable with no suspicious areas and expected radiation changes. We will continue to monitor this with routine imaging, and she has requested that we take over her follow up.  Recent pneumonia, August 2022. She continues steroid taper and Levaquin as prescribed and will see Dr. Alcide Clever on Friday.  Severe oxygen dependent asthma.  Post radiation changes seen on chest x-ray and CT scans.  Plan: This is a pleasant 75 year old female with presumed low grade adenocarcinoma of the lung. She was treated with stereotactic radiation therapy by Dr. Sondra Come, completed in February 2022. Since that time, imaging has remained stable with post radiaiton changes. We will continue to follow her routinely, initially every 2-3 months. She will continue to follow with Dr. Alcide Clever and will see him again Friday. She knows to continue steroid taper and Levaquin as prescribed. We will see her back in 1-2 months with CBC and CMP for repeat  evaluation. If all goes well, we would wait until next year to repeat imaging. The patient verbalizes understanding of and agreement to the plans discussed.  Thank you for the opportunity to participate in the care of your patients   I provided 50 minutes of face-to-face time during this this encounter and > 50% was spent counseling as documented under my assessment and plan.    Derwood Kaplan, MD [facility] AT [facility] [address] [place] [address] Dept: [phone] Dept Fax: [phone]   I, Rita Ohara, am acting as scribe for Derwood Kaplan, MD  I have reviewed this report as typed by the medical scribe, and it is complete and accurate.  Hermina Barters

## 2021-06-06 ENCOUNTER — Other Ambulatory Visit: Payer: Self-pay | Admitting: Oncology

## 2021-06-06 ENCOUNTER — Inpatient Hospital Stay: Payer: Medicare HMO

## 2021-06-06 ENCOUNTER — Inpatient Hospital Stay: Payer: Medicare HMO | Attending: Oncology | Admitting: Oncology

## 2021-06-06 ENCOUNTER — Telehealth: Payer: Self-pay | Admitting: Oncology

## 2021-06-06 ENCOUNTER — Encounter: Payer: Self-pay | Admitting: Oncology

## 2021-06-06 VITALS — BP 159/68 | HR 98 | Temp 98.2°F | Resp 18 | Ht 61.0 in | Wt 118.7 lb

## 2021-06-06 DIAGNOSIS — R918 Other nonspecific abnormal finding of lung field: Secondary | ICD-10-CM

## 2021-06-06 DIAGNOSIS — C3412 Malignant neoplasm of upper lobe, left bronchus or lung: Secondary | ICD-10-CM | POA: Insufficient documentation

## 2021-06-06 DIAGNOSIS — D649 Anemia, unspecified: Secondary | ICD-10-CM | POA: Diagnosis not present

## 2021-06-06 DIAGNOSIS — Z79899 Other long term (current) drug therapy: Secondary | ICD-10-CM | POA: Diagnosis not present

## 2021-06-06 DIAGNOSIS — Z8701 Personal history of pneumonia (recurrent): Secondary | ICD-10-CM | POA: Diagnosis not present

## 2021-06-06 DIAGNOSIS — J4550 Severe persistent asthma, uncomplicated: Secondary | ICD-10-CM | POA: Diagnosis not present

## 2021-06-06 DIAGNOSIS — K449 Diaphragmatic hernia without obstruction or gangrene: Secondary | ICD-10-CM | POA: Insufficient documentation

## 2021-06-06 DIAGNOSIS — Z9981 Dependence on supplemental oxygen: Secondary | ICD-10-CM | POA: Diagnosis not present

## 2021-06-06 DIAGNOSIS — Z885 Allergy status to narcotic agent status: Secondary | ICD-10-CM | POA: Diagnosis not present

## 2021-06-06 DIAGNOSIS — Z8249 Family history of ischemic heart disease and other diseases of the circulatory system: Secondary | ICD-10-CM | POA: Insufficient documentation

## 2021-06-06 DIAGNOSIS — R062 Wheezing: Secondary | ICD-10-CM | POA: Diagnosis not present

## 2021-06-06 DIAGNOSIS — Z923 Personal history of irradiation: Secondary | ICD-10-CM | POA: Insufficient documentation

## 2021-06-06 DIAGNOSIS — R911 Solitary pulmonary nodule: Secondary | ICD-10-CM | POA: Diagnosis not present

## 2021-06-06 DIAGNOSIS — Z886 Allergy status to analgesic agent status: Secondary | ICD-10-CM | POA: Insufficient documentation

## 2021-06-06 DIAGNOSIS — R0602 Shortness of breath: Secondary | ICD-10-CM | POA: Insufficient documentation

## 2021-06-06 DIAGNOSIS — R69 Illness, unspecified: Secondary | ICD-10-CM | POA: Diagnosis not present

## 2021-06-06 DIAGNOSIS — I700 Atherosclerosis of aorta: Secondary | ICD-10-CM | POA: Insufficient documentation

## 2021-06-06 DIAGNOSIS — J4541 Moderate persistent asthma with (acute) exacerbation: Secondary | ICD-10-CM | POA: Diagnosis not present

## 2021-06-06 DIAGNOSIS — Z9049 Acquired absence of other specified parts of digestive tract: Secondary | ICD-10-CM | POA: Diagnosis not present

## 2021-06-06 DIAGNOSIS — R5383 Other fatigue: Secondary | ICD-10-CM | POA: Diagnosis not present

## 2021-06-06 DIAGNOSIS — Z818 Family history of other mental and behavioral disorders: Secondary | ICD-10-CM | POA: Diagnosis not present

## 2021-06-06 DIAGNOSIS — I2510 Atherosclerotic heart disease of native coronary artery without angina pectoris: Secondary | ICD-10-CM | POA: Diagnosis not present

## 2021-06-06 DIAGNOSIS — Z888 Allergy status to other drugs, medicaments and biological substances status: Secondary | ICD-10-CM | POA: Insufficient documentation

## 2021-06-06 DIAGNOSIS — Z87891 Personal history of nicotine dependence: Secondary | ICD-10-CM | POA: Insufficient documentation

## 2021-06-06 DIAGNOSIS — Z8349 Family history of other endocrine, nutritional and metabolic diseases: Secondary | ICD-10-CM | POA: Insufficient documentation

## 2021-06-06 DIAGNOSIS — Z833 Family history of diabetes mellitus: Secondary | ICD-10-CM | POA: Insufficient documentation

## 2021-06-06 DIAGNOSIS — C3431 Malignant neoplasm of lower lobe, right bronchus or lung: Secondary | ICD-10-CM | POA: Diagnosis not present

## 2021-06-06 DIAGNOSIS — J449 Chronic obstructive pulmonary disease, unspecified: Secondary | ICD-10-CM | POA: Insufficient documentation

## 2021-06-06 DIAGNOSIS — R0603 Acute respiratory distress: Secondary | ICD-10-CM | POA: Diagnosis not present

## 2021-06-06 DIAGNOSIS — J45909 Unspecified asthma, uncomplicated: Secondary | ICD-10-CM | POA: Insufficient documentation

## 2021-06-06 LAB — PROTIME-INR
INR: 1.1 (ref 0.8–1.2)
Prothrombin Time: 13.9 seconds (ref 11.4–15.2)

## 2021-06-06 LAB — CBC AND DIFFERENTIAL
HCT: 43 (ref 36–46)
Hemoglobin: 13.9 (ref 12.0–16.0)
Neutrophils Absolute: 9.32
Platelets: 338 (ref 150–399)
WBC: 11.1

## 2021-06-06 LAB — COMPREHENSIVE METABOLIC PANEL
Albumin: 4.1 (ref 3.5–5.0)
Calcium: 9.7 (ref 8.7–10.7)

## 2021-06-06 LAB — BASIC METABOLIC PANEL
BUN: 19 (ref 4–21)
CO2: 29 — AB (ref 13–22)
Chloride: 97 — AB (ref 99–108)
Creatinine: 0.9 (ref 0.5–1.1)
Glucose: 87
Potassium: 3.5 (ref 3.4–5.3)
Sodium: 136 — AB (ref 137–147)

## 2021-06-06 LAB — HEPATIC FUNCTION PANEL
ALT: 33 (ref 7–35)
AST: 40 — AB (ref 13–35)
Alkaline Phosphatase: 79 (ref 25–125)
Bilirubin, Total: 0.4

## 2021-06-06 LAB — APTT: aPTT: 26 seconds (ref 24–36)

## 2021-06-06 LAB — CBC: RBC: 4.65 (ref 3.87–5.11)

## 2021-06-06 NOTE — Telephone Encounter (Signed)
Per 9/28 LOS, patient scheduled for Nov Appt's.  Tana Conch Appt Summary to give to patient

## 2021-06-08 LAB — CEA: CEA: 6.5 ng/mL — ABNORMAL HIGH (ref 0.0–4.7)

## 2021-06-13 ENCOUNTER — Encounter: Payer: Self-pay | Admitting: Oncology

## 2021-06-13 DIAGNOSIS — J45909 Unspecified asthma, uncomplicated: Secondary | ICD-10-CM | POA: Diagnosis not present

## 2021-06-14 DIAGNOSIS — J9610 Chronic respiratory failure, unspecified whether with hypoxia or hypercapnia: Secondary | ICD-10-CM | POA: Diagnosis not present

## 2021-06-21 DIAGNOSIS — I10 Essential (primary) hypertension: Secondary | ICD-10-CM | POA: Diagnosis not present

## 2021-06-21 DIAGNOSIS — R1084 Generalized abdominal pain: Secondary | ICD-10-CM | POA: Diagnosis not present

## 2021-06-21 DIAGNOSIS — R110 Nausea: Secondary | ICD-10-CM | POA: Diagnosis not present

## 2021-06-21 DIAGNOSIS — Z743 Need for continuous supervision: Secondary | ICD-10-CM | POA: Diagnosis not present

## 2021-07-03 DIAGNOSIS — R5383 Other fatigue: Secondary | ICD-10-CM | POA: Diagnosis not present

## 2021-07-03 DIAGNOSIS — R918 Other nonspecific abnormal finding of lung field: Secondary | ICD-10-CM | POA: Diagnosis not present

## 2021-07-03 DIAGNOSIS — J4541 Moderate persistent asthma with (acute) exacerbation: Secondary | ICD-10-CM | POA: Diagnosis not present

## 2021-07-04 DIAGNOSIS — R5383 Other fatigue: Secondary | ICD-10-CM | POA: Diagnosis not present

## 2021-07-04 DIAGNOSIS — R918 Other nonspecific abnormal finding of lung field: Secondary | ICD-10-CM | POA: Diagnosis not present

## 2021-07-04 DIAGNOSIS — J4541 Moderate persistent asthma with (acute) exacerbation: Secondary | ICD-10-CM | POA: Diagnosis not present

## 2021-07-05 DIAGNOSIS — J4541 Moderate persistent asthma with (acute) exacerbation: Secondary | ICD-10-CM | POA: Diagnosis not present

## 2021-07-05 DIAGNOSIS — R918 Other nonspecific abnormal finding of lung field: Secondary | ICD-10-CM | POA: Diagnosis not present

## 2021-07-05 DIAGNOSIS — R5383 Other fatigue: Secondary | ICD-10-CM | POA: Diagnosis not present

## 2021-07-06 DIAGNOSIS — R5383 Other fatigue: Secondary | ICD-10-CM | POA: Diagnosis not present

## 2021-07-06 DIAGNOSIS — J4541 Moderate persistent asthma with (acute) exacerbation: Secondary | ICD-10-CM | POA: Diagnosis not present

## 2021-07-06 DIAGNOSIS — R918 Other nonspecific abnormal finding of lung field: Secondary | ICD-10-CM | POA: Diagnosis not present

## 2021-07-14 DIAGNOSIS — J45909 Unspecified asthma, uncomplicated: Secondary | ICD-10-CM | POA: Diagnosis not present

## 2021-07-15 DIAGNOSIS — J9610 Chronic respiratory failure, unspecified whether with hypoxia or hypercapnia: Secondary | ICD-10-CM | POA: Diagnosis not present

## 2021-07-17 DIAGNOSIS — R5383 Other fatigue: Secondary | ICD-10-CM | POA: Diagnosis not present

## 2021-07-17 DIAGNOSIS — J4550 Severe persistent asthma, uncomplicated: Secondary | ICD-10-CM | POA: Diagnosis not present

## 2021-07-17 DIAGNOSIS — R918 Other nonspecific abnormal finding of lung field: Secondary | ICD-10-CM | POA: Diagnosis not present

## 2021-07-23 ENCOUNTER — Encounter: Payer: Self-pay | Admitting: Hematology and Oncology

## 2021-07-23 ENCOUNTER — Inpatient Hospital Stay: Payer: Medicare HMO | Attending: Oncology

## 2021-07-23 ENCOUNTER — Other Ambulatory Visit: Payer: Self-pay

## 2021-07-23 ENCOUNTER — Inpatient Hospital Stay (INDEPENDENT_AMBULATORY_CARE_PROVIDER_SITE_OTHER): Payer: Medicare HMO | Admitting: Hematology and Oncology

## 2021-07-23 VITALS — BP 133/84 | HR 75 | Temp 97.6°F | Resp 18 | Ht 61.0 in | Wt 119.3 lb

## 2021-07-23 DIAGNOSIS — C3412 Malignant neoplasm of upper lobe, left bronchus or lung: Secondary | ICD-10-CM | POA: Insufficient documentation

## 2021-07-23 DIAGNOSIS — E876 Hypokalemia: Secondary | ICD-10-CM | POA: Diagnosis not present

## 2021-07-23 DIAGNOSIS — R918 Other nonspecific abnormal finding of lung field: Secondary | ICD-10-CM

## 2021-07-23 DIAGNOSIS — R300 Dysuria: Secondary | ICD-10-CM

## 2021-07-23 DIAGNOSIS — C3431 Malignant neoplasm of lower lobe, right bronchus or lung: Secondary | ICD-10-CM | POA: Insufficient documentation

## 2021-07-23 HISTORY — DX: Dysuria: R30.0

## 2021-07-23 HISTORY — DX: Hypokalemia: E87.6

## 2021-07-23 LAB — COMPREHENSIVE METABOLIC PANEL
Albumin: 3.9 (ref 3.5–5.0)
Calcium: 9 (ref 8.7–10.7)

## 2021-07-23 LAB — CBC AND DIFFERENTIAL
HCT: 46 (ref 36–46)
Hemoglobin: 15.1 (ref 12.0–16.0)
Neutrophils Absolute: 3.94
Platelets: 256 (ref 150–399)
WBC: 8.2

## 2021-07-23 LAB — CBC
MCV: 94 (ref 81–99)
RBC: 4.89 (ref 3.87–5.11)

## 2021-07-23 LAB — BASIC METABOLIC PANEL
BUN: 13 (ref 4–21)
CO2: 31 — AB (ref 13–22)
Chloride: 102 (ref 99–108)
Creatinine: 0.7 (ref 0.5–1.1)
Glucose: 96
Potassium: 3.4 (ref 3.4–5.3)
Sodium: 138 (ref 137–147)

## 2021-07-23 LAB — HEPATIC FUNCTION PANEL
ALT: 29 (ref 7–35)
AST: 40 — AB (ref 13–35)
Alkaline Phosphatase: 77 (ref 25–125)
Bilirubin, Total: 0.7

## 2021-07-23 NOTE — Assessment & Plan Note (Addendum)
Treated with SBRT in February. This was stable on CTA chest in August.  She denies new symptoms concerning for recurrent/progressive disease. She is scheduled to see Dr. Sondra Come again in January with repeat CT imaging, but would like to follow up here, so we will see her in 2 months with a CBC, comprehensive metabolic panel, CEA and CT chest to reassess her disease baseline.

## 2021-07-23 NOTE — Progress Notes (Signed)
[facility]  [address] [place],  [place]  [address] [phone]  Clinic Day:  07/23/2021  Referring physician: Lowella Dandy, NP  ASSESSMENT & PLAN:   Assessment & Plan: Cancer of upper lobe of left lung ([place]) Treated with SBRT in February. This was not well visualized on CTA chest in August due to surrounding radiation changes. She denies new symptoms concerning for recurrent/progressive disease. She is scheduled to see Dr. Sondra Come again in January with repeat CT imaging, but would like to follow up here, so we will see her in 2 months with a CBC, comprehensive metabolic panel, CEA and CT chest to reassess her disease baseline.  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 This was stable on CTA chest in August.  She denies new symptoms concerning for recurrent/progressive disease. She is scheduled to see Dr. Sondra Come again in January with repeat CT imaging, but would like to follow up here, so we will see her in 2 months with a CBC, comprehensive metabolic panel, CEA and CT chest to reassess her disease baseline.  Hypokalemia Borderline hypokalemia of uncertain etiology. I gave her a list of potassium containing food to increase potassium in her diet and will continue to monitor this.  Dysuria Patient reports frequent urinary tract infections with dysuria and bilateral lower back pain since yesterday. I will check a urinalysis and urine culture. I advised her to discuss with her primary care provider to see if they would recommend any prophylactic treatment.    The patient understands the plans discussed today and is in agreement with them.  She knows to contact our office if she develops concerns prior to her next appointment.    Angela Pickles, PA-C  [facility] [address] [place] [address] Dept: [phone] Dept Fax: [phone]   Orders  Placed This Encounter  Procedures   CT CHEST W CONTRAST    Standing Status:   Future    Standing Expiration Date:   07/23/2022    Order Specific Question:   If indicated for the ordered procedure, I authorize the administration of contrast media per Radiology protocol    Answer:   Yes    Order Specific Question:   Preferred imaging location?    Answer:   External    Comments:   [facility]    Order Specific Question:   Radiology Contrast Protocol - do NOT remove file path    Answer:   [email or web address]   CBC and differential    This external order was created through the Results Console.   CBC    This external order was created through the Results Console.   CBC    This order was created through External Result Entry   CBC with Differential ([place] Only)    Standing Status:   Future    Standing Expiration Date:   07/23/2022   CMP ([place] only)    Standing Status:   Future    Standing Expiration Date:   07/23/2022   CEA    Standing Status:   Future    Standing Expiration Date:   07/23/2022   Miscellaneous test (send-out)    Standing Status:   Future    Number of Occurrences:   1    Standing Expiration Date:   07/23/2022    Order Specific Question:   Test name / description:    Answer:  urinalysis and urine culture to [facility]   Basic metabolic panel    This external order was created through the Results Console.   Comprehensive metabolic panel    This external order was created through the Results Console.   Hepatic function panel    This external order was created through the Results Console.       CHIEF COMPLAINT:  CC: Presumed bilateral stage I lung cancer status post SBRT  Current Treatment:  Observation   HISTORY OF PRESENT ILLNESS:  Angela Phillips is a 75 y.o. female who we began seeing as an inpatient in August at the request of Dr. Gardiner Rhyme while she was admitted for pneumonia. 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 Bronchoscopy with biopsy at that time revealed atypical cells. She was deemed a poor surgical candidate due to multiple comorbidities including severe COPD, but also the multifocal nature of this disease. Recommendations from the multidisciplinary thoracic oncology conference was for stereotactic radiation therapy. She completed SBRT radiation therapy in February without difficulty, 60 Gy to the left upper lobe, and 54 Gy to the right lower lobe. Follow up imaging and office visit in July with Dr. Sondra Come revealed her to be stable with no suspicious areas and expected radiation changes.  She most recently has been followed by Dr. Alcide Clever and treated for pneumonia. Her respiratory status declined and she was admitted from the ED in late August. She was treated with IV antibiotics and appears to be improving. CTA imaging in the ED revealed progressive radiation changes in the left lung apex and multifocal tumor in the right lower lobe, overall grossly unchanged. These images were compared to her post radiation imaging from July. CEA in the hospital was elevated at 7.1. The patient requested follow-up in our office. At her last visit in September, she was scheduled with Dr. Alcide Clever again for follow up of severe respiratory distress, which was treated with steroid injections, as well as steroid taper and levofloxacin. She is scheduled to see Dr. Sondra Come again in January with repeat CT imaging.   Oncology History  Cancer of upper lobe of left lung ([place])  10/11/2020 Initial Diagnosis   Cancer of upper lobe of left lung ([place])   10/11/2020 Cancer Staging   Staging form: Lung, AJCC 8th Edition - Clinical stage from 10/11/2020: Stage IA2 (cT1b, cN0, cM0) - Signed by Derwood Kaplan, MD on 06/10/2021 Histopathologic type: Adenocarcinoma, NOS Stage prefix: Initial  diagnosis Histologic grade (G): GX Histologic grading system: 4 grade system Laterality: Left Tumor size (mm): 16 Lymph-vascular invasion (LVI): Presence of LVI unknown/indeterminate Diagnostic confirmation: Radiography and other imaging techniques without microscopic confirmation Staged by: Managing physician Stage used in treatment planning: Yes National guidelines used in treatment planning: Yes Type of national guideline used in treatment planning: NCCN Staging comments: SBRT    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[place])  10/11/2020 Initial Diagnosis   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[place])   10/11/2020 Cancer Staging   Staging form: Lung, AJCC 8th Edition - Clinical stage from 10/11/2020: Stage IA3 (cT1c, cN0, cM0) - Signed by Derwood Kaplan, MD on 06/10/2021 Stage prefix: Initial diagnosis Histologic grade (G): GX Histologic grading system: 4 grade system Laterality: Right Tumor size (mm): 22 Lymph-vascular invasion (LVI): Presence of LVI unknown/indeterminate Diagnostic confirmation: Radiography and other imaging techniques  without microscopic confirmation Staged by: Managing physician Stage used in treatment planning: Yes National guidelines used in treatment planning: Yes Type of national guideline used in treatment planning: NCCN       INTERVAL HISTORY:  Angela Phillips is here today for repeat clinical assessment and states she has been doing fairly well. She denies cough, dyspnea or chest pain. She reports mild dysuria and bilateral lower back pain since yesterday so feels she may have another urinary tract infection. She reports she frequent urinary tract infections. She denies fevers or chills. She denies pain. Her appetite is decreased. Her weight has been stable. Once again, she requests her follow up be done here to avoid travel to [place].  REVIEW OF SYSTEMS:  Review of Systems  Constitutional:  Negative for appetite change, chills, fatigue, fever and unexpected  weight change.  HENT:   Negative for lump/mass, mouth sores and sore throat.   Respiratory:  Negative for cough and shortness of breath.   Cardiovascular:  Negative for chest pain and leg swelling.  Gastrointestinal:  Negative for abdominal pain, constipation, diarrhea, nausea and vomiting.  Endocrine: Negative for hot flashes.  Genitourinary:  Positive for dysuria. Negative for difficulty urinating, frequency and hematuria.   Musculoskeletal:  Positive for back pain (bilateral low back). Negative for arthralgias and myalgias.  Skin:  Negative for rash.  Neurological:  Negative for dizziness and headaches.  Hematological:  Negative for adenopathy. Does not bruise/bleed easily.  Psychiatric/Behavioral:  Negative for depression and sleep disturbance. The patient is not nervous/anxious.     VITALS:  Blood pressure 133/84, pulse 75, temperature 97.6 F (36.4 C), temperature source Oral, resp. rate 18, height 5\' 1"  (1.549 m), weight 119 lb 4.8 oz (54.1 kg), SpO2 96 %.  Wt Readings from Last 3 Encounters:  07/23/21 119 lb 4.8 oz (54.1 kg)  06/06/21 118 lb 11.2 oz (53.8 kg)  03/15/21 117 lb (53.1 kg)    Body mass index is 22.54 kg/m.  Performance status (ECOG): 1 - Symptomatic but completely ambulatory  PHYSICAL EXAM:  Physical Exam Vitals and nursing note reviewed.  Constitutional:      General: She is not in acute distress.    Appearance: Normal appearance.  HENT:     Head: Normocephalic and atraumatic.     Mouth/Throat:     Mouth: Mucous membranes are moist.     Pharynx: Oropharynx is clear. No oropharyngeal exudate or posterior oropharyngeal erythema.  Eyes:     General: No scleral icterus.    Extraocular Movements: Extraocular movements intact.     Conjunctiva/sclera: Conjunctivae normal.     Pupils: Pupils are equal, round, and reactive to light.  Cardiovascular:     Rate and Rhythm: Normal rate and regular rhythm.     Heart sounds: Normal heart sounds. No murmur heard.    No friction rub. No gallop.  Pulmonary:     Effort: Pulmonary effort is normal.     Breath sounds: Normal breath sounds. No wheezing, rhonchi or rales.  Abdominal:     General: There is no distension.     Palpations: Abdomen is soft. There is no mass.     Tenderness: There is no abdominal tenderness.  Musculoskeletal:        General: Normal range of motion.     Cervical back: Normal range of motion and neck supple. No tenderness.     Right lower leg: No edema.     Left lower leg: No edema.  Lymphadenopathy:  Cervical: No cervical adenopathy.  Skin:    General: Skin is warm and dry.     Coloration: Skin is not jaundiced.     Findings: No rash.  Neurological:     Mental Status: She is alert and oriented to person, place, and time.     Cranial Nerves: No cranial nerve deficit.  Psychiatric:        Mood and Affect: Mood normal.        Behavior: Behavior normal.        Thought Content: Thought content normal.    LABS:   CBC Latest Ref Rng & Units 07/23/2021 06/06/2021 08/23/2020  WBC - 8.2 11.1 8.7  Hemoglobin 12.0 - 16.0 15.1 13.9 15.4(H)  Hematocrit 36 - 46 46 43 47.1(H)  Platelets 150 - 399 256 338 272   CMP Latest Ref Rng & Units 07/23/2021 06/06/2021 08/23/2020  Glucose 70 - 99 mg/dL - - 106(H)  BUN 4 - 21 13 19 9   Creatinine 0.5 - 1.1 0.7 0.9 0.82  Sodium 137 - 147 138 136(A) 139  Potassium 3.4 - 5.3 3.4 3.5 3.5  Chloride 99 - 108 102 97(A) 101  CO2 13 - 22 31(A) 29(A) 29  Calcium 8.7 - 10.7 9.0 9.7 9.5  Total Protein 6.5 - 8.1 g/dL - - 6.5  Total Bilirubin 0.3 - 1.2 mg/dL - - 0.8  Alkaline Phos 25 - 125 77 79 61  AST 13 - 35 40(A) 40(A) 29  ALT 7 - 35 29 33 26     Lab Results  Component Value Date   CEA1 6.5 (H) 06/06/2021   /  CEA  Date Value Ref Range Status  06/06/2021 6.5 (H) 0.0 - 4.7 ng/mL Final    Comment:    (NOTE)                             Nonsmokers          <3.9                             Smokers             <5.6 [employer]  Electrochemiluminescence Immunoassay (ECLIA) Values obtained with different assay methods or kits cannot be used interchangeably.  Results cannot be interpreted as absolute evidence of the presence or absence of malignant disease. Performed At: [facility] [place], [place] [id number] Rush Farmer MD HF:[id number]    No results found for: PSA1 No results found for: CAN199 No results found for: CAN125  No results found for: TOTALPROTELP, ALBUMINELP, A1GS, A2GS, BETS, BETA2SER, GAMS, MSPIKE, SPEI No results found for: TIBC, FERRITIN, IRONPCTSAT No results found for: LDH  STUDIES:  No results found.    HISTORY:   Past Medical History:  Diagnosis Date   Asthma    COPD (chronic obstructive pulmonary disease) ([place])    Dyslipidemia (high LDL; low HDL) 08/13/2016   Dysuria 07/23/2021   History of radiation therapy 10/24/2020-11/06/2020   SBRT to left and right lungs   Dr Gery Pray   HTN (hypertension) 11/20/2016   Hypokalemia 07/23/2021   Paroxysmal SVT (supraventricular tachycardia) ([place]) 07/05/2016    Past Surgical History:  Procedure Laterality Date   ABDOMINAL HYSTERECTOMY     CARDIAC CATHETERIZATION     CARPAL TUNNEL RELEASE     CATARACT EXTRACTION     CHOLECYSTECTOMY  HEMORROIDECTOMY     JOINT REPLACEMENT     R knee   SVT ABLATION N/A 10/19/2018   Procedure: SVT ABLATION;  Surgeon: Evans Lance, MD;  Location: [facility];  Service: Cardiovascular;  Laterality: N/A;   TOE SURGERY     VIDEO BRONCHOSCOPY WITH ENDOBRONCHIAL NAVIGATION  08/24/2020   VIDEO BRONCHOSCOPY WITH ENDOBRONCHIAL NAVIGATION N/A 08/24/2020   Procedure: VIDEO BRONCHOSCOPY WITH ENDOBRONCHIAL NAVIGATION;  Surgeon: Melrose Nakayama, MD;  Location: [facility];  Service: Thoracic;  Laterality: N/A;    Family History  Problem Relation Age of Onset   Thyroid disease Brother    Dementia Father    Heart disease Mother    Diabetes Mother    Thyroid disease Sister      Social History:  reports that she has quit smoking. Her smoking use included cigarettes. She has a 7.50 pack-year smoking history. She quit smokeless tobacco use about 31 years ago. She reports that she does not drink alcohol and does not use drugs.The patient is alone today.  Allergies:  Allergies  Allergen Reactions   Aspirin Other (See Comments)    Bleeding (intolerance)   Buspirone Other (See Comments)    Patient does not recall taking this med   Celecoxib Other (See Comments)    Patient cannot remember reaction   Chlorpheniramine Other (See Comments)    Patient cannot remember reaction   Citalopram Other (See Comments)    Patient cannot remember reaction   Ezetimibe Other (See Comments)    Myalgias (intolerance)   Hydrocodone-Acetaminophen Itching   Prochlorperazine Other (See Comments)    "Makes me feel like I'm having a face stroke"   Statins Other (See Comments)    Myalgias (intolerance)   Venlafaxine Other (See Comments)    Patient cannot remember reaction    Current Medications: Current Outpatient Medications  Medication Sig Dispense Refill   albuterol (PROVENTIL) (2.5 MG/3ML) 0.083% nebulizer solution Take 2.5 mg by nebulization every 6 (six) hours as needed for wheezing or shortness of breath.     albuterol (VENTOLIN HFA) 108 (90 Base) MCG/ACT inhaler Inhale 2 puffs into the lungs every 6 (six) hours as needed for wheezing or shortness of breath.     clonazePAM (KLONOPIN) 1 MG tablet Take 1 mg by mouth every 6 (six) hours as needed for anxiety.     FLUoxetine (PROZAC) 20 MG capsule Take 20 mg by mouth 2 (two) times daily.     gabapentin (NEURONTIN) 300 MG capsule Take 300 mg by mouth at bedtime.     levalbuterol (XOPENEX) 0.63 MG/3ML nebulizer solution SMARTSIG:1 Via Inhaler     montelukast (SINGULAIR) 10 MG tablet Take 1 tablet by mouth at bedtime.     Multiple Vitamin (MULTI-VITAMINS) TABS Take 1 tablet by mouth daily. Centrum silver     omeprazole (PRILOSEC)  40 MG capsule Take 40 mg by mouth daily.     Probiotic Product (PROBIOTIC ADVANCED PO) Take by mouth. 60 billion     simvastatin (ZOCOR) 40 MG tablet Take 20 mg by mouth at bedtime.     SYMBICORT 160-4.5 MCG/ACT inhaler Inhale 2 puffs into the lungs 2 (two) times daily.     No current facility-administered medications for this visit.

## 2021-07-23 NOTE — Assessment & Plan Note (Signed)
Patient reports frequent urinary tract infections with dysuria and bilateral lower back pain since yesterday. I will check a urinalysis and urine culture. I advised her to discuss with her primary care provider to see if they would recommend any prophylactic treatment.

## 2021-07-23 NOTE — Assessment & Plan Note (Addendum)
Borderline hypokalemia of uncertain etiology. I gave her a list of potassium containing food to increase potassium in her diet and will continue to monitor this.

## 2021-07-23 NOTE — Assessment & Plan Note (Addendum)
Treated with SBRT in February. This was not well visualized on CTA chest in August due to surrounding radiation changes. She denies new symptoms concerning for recurrent/progressive disease. She is scheduled to see Dr. Sondra Come again in January with repeat CT imaging, but would like to follow up here, so we will see her in 2 months with a CBC, comprehensive metabolic panel, CEA and CT chest to reassess her disease baseline.

## 2021-07-25 LAB — CEA: CEA: 4.7 ng/mL (ref 0.0–4.7)

## 2021-07-26 ENCOUNTER — Telehealth: Payer: Self-pay

## 2021-07-26 NOTE — Telephone Encounter (Signed)
Patient notified

## 2021-07-26 NOTE — Telephone Encounter (Signed)
-----   Message from [name], PA-C sent at 07/25/2021  2:33 PM EST ----- Please let her know her cancer test in the blood (CEA) is normal, thanks!

## 2021-08-07 DIAGNOSIS — R5383 Other fatigue: Secondary | ICD-10-CM | POA: Diagnosis not present

## 2021-08-07 DIAGNOSIS — J4550 Severe persistent asthma, uncomplicated: Secondary | ICD-10-CM | POA: Diagnosis not present

## 2021-08-07 DIAGNOSIS — R69 Illness, unspecified: Secondary | ICD-10-CM | POA: Diagnosis not present

## 2021-08-07 DIAGNOSIS — R059 Cough, unspecified: Secondary | ICD-10-CM | POA: Diagnosis not present

## 2021-08-07 DIAGNOSIS — R918 Other nonspecific abnormal finding of lung field: Secondary | ICD-10-CM | POA: Diagnosis not present

## 2021-08-08 DIAGNOSIS — R5383 Other fatigue: Secondary | ICD-10-CM | POA: Diagnosis not present

## 2021-08-08 DIAGNOSIS — R918 Other nonspecific abnormal finding of lung field: Secondary | ICD-10-CM | POA: Diagnosis not present

## 2021-08-08 DIAGNOSIS — J4550 Severe persistent asthma, uncomplicated: Secondary | ICD-10-CM | POA: Diagnosis not present

## 2021-08-08 DIAGNOSIS — R69 Illness, unspecified: Secondary | ICD-10-CM | POA: Diagnosis not present

## 2021-08-13 DIAGNOSIS — J45909 Unspecified asthma, uncomplicated: Secondary | ICD-10-CM | POA: Diagnosis not present

## 2021-08-14 DIAGNOSIS — J9610 Chronic respiratory failure, unspecified whether with hypoxia or hypercapnia: Secondary | ICD-10-CM | POA: Diagnosis not present

## 2021-08-15 DIAGNOSIS — J4550 Severe persistent asthma, uncomplicated: Secondary | ICD-10-CM | POA: Diagnosis not present

## 2021-08-15 DIAGNOSIS — R5383 Other fatigue: Secondary | ICD-10-CM | POA: Diagnosis not present

## 2021-08-15 DIAGNOSIS — R69 Illness, unspecified: Secondary | ICD-10-CM | POA: Diagnosis not present

## 2021-08-15 DIAGNOSIS — R918 Other nonspecific abnormal finding of lung field: Secondary | ICD-10-CM | POA: Diagnosis not present

## 2021-08-23 DIAGNOSIS — M25532 Pain in left wrist: Secondary | ICD-10-CM | POA: Diagnosis not present

## 2021-08-23 DIAGNOSIS — S61412A Laceration without foreign body of left hand, initial encounter: Secondary | ICD-10-CM | POA: Diagnosis not present

## 2021-08-23 DIAGNOSIS — W19XXXA Unspecified fall, initial encounter: Secondary | ICD-10-CM | POA: Diagnosis not present

## 2021-08-23 DIAGNOSIS — S0990XA Unspecified injury of head, initial encounter: Secondary | ICD-10-CM | POA: Diagnosis not present

## 2021-08-23 DIAGNOSIS — S61217A Laceration without foreign body of left little finger without damage to nail, initial encounter: Secondary | ICD-10-CM | POA: Diagnosis not present

## 2021-08-23 DIAGNOSIS — Z043 Encounter for examination and observation following other accident: Secondary | ICD-10-CM | POA: Diagnosis not present

## 2021-08-23 DIAGNOSIS — Z23 Encounter for immunization: Secondary | ICD-10-CM | POA: Diagnosis not present

## 2021-08-23 DIAGNOSIS — M79642 Pain in left hand: Secondary | ICD-10-CM | POA: Diagnosis not present

## 2021-08-23 DIAGNOSIS — S0003XA Contusion of scalp, initial encounter: Secondary | ICD-10-CM | POA: Diagnosis not present

## 2021-09-05 DIAGNOSIS — J4550 Severe persistent asthma, uncomplicated: Secondary | ICD-10-CM | POA: Diagnosis not present

## 2021-09-11 ENCOUNTER — Telehealth: Payer: Self-pay | Admitting: *Deleted

## 2021-09-11 NOTE — Telephone Encounter (Signed)
Called patient to inform that Dr. Sondra Come states that if she wants to have a CT in [place] to have Dr. Hinton Rao to order it, patient verifies understanding this

## 2021-09-13 DIAGNOSIS — J45909 Unspecified asthma, uncomplicated: Secondary | ICD-10-CM | POA: Diagnosis not present

## 2021-09-14 ENCOUNTER — Ambulatory Visit (HOSPITAL_COMMUNITY): Payer: Medicare HMO

## 2021-09-14 DIAGNOSIS — J9610 Chronic respiratory failure, unspecified whether with hypoxia or hypercapnia: Secondary | ICD-10-CM | POA: Diagnosis not present

## 2021-09-17 ENCOUNTER — Ambulatory Visit: Payer: Self-pay | Admitting: Radiation Oncology

## 2021-09-17 DIAGNOSIS — R69 Illness, unspecified: Secondary | ICD-10-CM | POA: Diagnosis not present

## 2021-09-17 DIAGNOSIS — J4540 Moderate persistent asthma, uncomplicated: Secondary | ICD-10-CM | POA: Diagnosis not present

## 2021-09-17 DIAGNOSIS — C3490 Malignant neoplasm of unspecified part of unspecified bronchus or lung: Secondary | ICD-10-CM | POA: Diagnosis not present

## 2021-09-17 DIAGNOSIS — K529 Noninfective gastroenteritis and colitis, unspecified: Secondary | ICD-10-CM | POA: Diagnosis not present

## 2021-09-17 DIAGNOSIS — K219 Gastro-esophageal reflux disease without esophagitis: Secondary | ICD-10-CM | POA: Diagnosis not present

## 2021-09-17 DIAGNOSIS — E785 Hyperlipidemia, unspecified: Secondary | ICD-10-CM | POA: Diagnosis not present

## 2021-09-17 DIAGNOSIS — Z6822 Body mass index (BMI) 22.0-22.9, adult: Secondary | ICD-10-CM | POA: Diagnosis not present

## 2021-09-17 DIAGNOSIS — J9611 Chronic respiratory failure with hypoxia: Secondary | ICD-10-CM | POA: Diagnosis not present

## 2021-09-17 DIAGNOSIS — E1165 Type 2 diabetes mellitus with hyperglycemia: Secondary | ICD-10-CM | POA: Diagnosis not present

## 2021-09-17 DIAGNOSIS — E119 Type 2 diabetes mellitus without complications: Secondary | ICD-10-CM | POA: Diagnosis not present

## 2021-09-18 NOTE — Progress Notes (Signed)
Angela Phillips  [address] [place],    [address] [phone]  Clinic Day:  09/21/2021  Referring physician: Lowella Dandy, NP  This document serves as a record of services personally performed by Hosie Poisson, MD. It was created on their behalf by Curry,Lauren E, a trained medical scribe. The creation of this record is based on the scribe's personal observations and the provider's statements to them.  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 Bronchoscopy with biopsy at that time revealed atypical cells. She was deemed a poor surgical candidate due to multiple comorbidities including severe oxygen dependent COPD. She completed stereotactic radiation therapy to both lesions in late February 2022 without difficulty. Follow up imaging and office visit in July with Dr. Sondra Come revealed her to be stable with no suspicious areas and expected radiation changes. We will continue to monitor this with routine imaging, and she has requested that we take over her follow up. She is due for a scan now.   2. Severe asthma. This has significant improved with FASENRA injections through Dr. Alcide Clever.   3. Post radiation changes seen on chest x-ray and CT scans.   As she is due for repeat imaging, we will schedule this for her next week. I will contact her with the results. Otherwise, we will see her back in 3 months with CBC, CMP and CEA for repeat evaluation. If all is well, we can repeat imaging in 6 months. The patient verbalizes understanding of and agreement to the plans discussed.   I provided 15 minutes of face-to-face time during this this encounter and > 50% was spent counseling as documented under my assessment and plan.     [facility] [place] [address] Dept: [phone] Dept Fax: [phone]   No orders of the defined types were placed in this encounter.   CHIEF COMPLAINT:  CC: Presumed bilateral stage I lung cancer status post SBRT  Current Treatment:  Observation   HISTORY OF PRESENT ILLNESS:  Angela Phillips is a 76 y.o. female who we began seeing as an inpatient in August at the request of Dr. Gardiner Rhyme while she was admitted for pneumonia. 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 Bronchoscopy with biopsy at that time revealed atypical cells. She was deemed a poor surgical candidate due to multiple comorbidities including severe COPD, but also the multifocal nature of this disease. Recommendations from the multidisciplinary thoracic oncology conference was for stereotactic radiation therapy. She completed SBRT radiation therapy in February without difficulty, 60 Gy to the left upper lobe, and 54 Gy to the right lower lobe. Follow up imaging and office visit in July with Dr. Sondra Come revealed her to be stable with no suspicious areas and expected radiation changes.  She most recently has been followed by Dr. Alcide Clever and treated for pneumonia. Her respiratory status declined and she was admitted from the ED in late August. She was treated with IV antibiotics and appears to be improving. CTA imaging in the ED revealed progressive radiation changes in the left lung apex and multifocal tumor in the right lower lobe, overall grossly unchanged. These images were  compared to her post radiation imaging from July. CEA in the hospital was elevated at 7.1. The patient requested follow-up in our office. At her last visit in September, she was scheduled with Dr. Alcide Clever again for follow up of severe respiratory distress, which was treated with steroid injections, as well as steroid  taper and levofloxacin. She is scheduled to see Dr. Sondra Come again in January with repeat CT imaging.   Oncology History  Cancer of upper lobe of left lung ([place])  10/11/2020 Initial Diagnosis   Cancer of upper lobe of left lung ([place])   10/11/2020 Cancer Staging   Staging form: Lung, AJCC 8th Edition - Clinical stage from 10/11/2020: Stage IA2 (cT1b, cN0, cM0) - Signed by Derwood Kaplan, MD on 06/10/2021 Histopathologic type: Adenocarcinoma, NOS Stage prefix: Initial diagnosis Histologic grade (G): GX Histologic grading system: 4 grade system Laterality: Left Tumor size (mm): 16 Lymph-vascular invasion (LVI): Presence of LVI unknown/indeterminate Diagnostic confirmation: Radiography and other imaging techniques without microscopic confirmation Staged by: Managing physician Stage used in treatment planning: Yes National guidelines used in treatment planning: Yes Type of national guideline used in treatment planning: NCCN Staging comments: SBRT    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[place])  10/11/2020 Initial Diagnosis   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[place])   10/11/2020 Cancer Staging   Staging form: Lung, AJCC 8th Edition - Clinical stage from 10/11/2020: Stage IA3 (cT1c, cN0, cM0) - Signed by Derwood Kaplan, MD on 06/10/2021 Stage prefix: Initial diagnosis Histologic grade (G): GX Histologic grading system: 4 grade system Laterality: Right Tumor size (mm): 22 Lymph-vascular invasion (LVI): Presence of LVI unknown/indeterminate Diagnostic confirmation: Radiography and other imaging techniques without microscopic confirmation Staged by: Managing physician Stage used in treatment planning: Yes National guidelines used in treatment planning: Yes Type of national guideline used in treatment planning: NCCN       INTERVAL HISTORY:  Angela Phillips is here for routine follow up and states that she has started [facility] through Dr. Gerre Couch and has had excellent improvement in her breathing.  She has occasional cough, which is sometimes productive with white sputum. She does note diarrhea. There was some interference with scheduling her CT imaging, and so we will schedule this for her now. Blood counts and chemistries are unremarkable. Her  appetite is good, and she has gained 1 pound since her last visit.  She denies fever, chills or other signs of infection.  She denies nausea, vomiting, bowel issues, or abdominal pain.  She denies sore throat, cough, dyspnea, or chest pain.  REVIEW OF SYSTEMS:  Review of Systems  Constitutional: Negative.  Negative for appetite change, chills, fatigue, fever and unexpected weight change.  HENT:  Negative.    Eyes: Negative.   Respiratory:  Positive for cough (ocassional, sometimes productive with white sputum). Negative for chest tightness, hemoptysis, shortness of breath and wheezing.   Cardiovascular: Negative.  Negative for chest pain, leg swelling and palpitations.  Gastrointestinal:  Positive for diarrhea. Negative for abdominal distention, abdominal pain, blood in stool, constipation, nausea and vomiting.  Endocrine: Negative.   Genitourinary: Negative.  Negative for difficulty urinating, dysuria, frequency and hematuria.   Musculoskeletal: Negative.  Negative for arthralgias, back pain, flank pain, gait problem and myalgias.  Skin: Negative.   Neurological: Negative.  Negative for dizziness, extremity weakness, gait problem, headaches, light-headedness, numbness, seizures and speech difficulty.  Hematological: Negative.   Psychiatric/Behavioral: Negative.  Negative for depression and sleep disturbance. The patient is not nervous/anxious.  VITALS:  Blood pressure (!) 171/70, pulse 84, temperature 98.3 F (36.8 C), temperature source Oral, resp. rate 18, height 5\' 1"  (1.549 m), weight 120 lb 12.8 oz (54.8 kg), SpO2 93 %.  Wt Readings from Last 3 Encounters:  09/21/21 120 lb 12.8 oz (54.8 kg)  07/23/21 119 lb 4.8 oz (54.1 kg)  06/06/21  118 lb 11.2 oz (53.8 kg)    Body mass index is 22.82 kg/m.  Performance status (ECOG): 1 - Symptomatic but completely ambulatory  PHYSICAL EXAM:  Physical Exam Constitutional:      General: She is not in acute distress.    Appearance: Normal appearance. She is normal weight.  HENT:     Head: Normocephalic and atraumatic.  Eyes:     General: No scleral icterus.    Extraocular Movements: Extraocular movements intact.     Conjunctiva/sclera: Conjunctivae normal.     Pupils: Pupils are equal, round, and reactive to light.  Cardiovascular:     Rate and Rhythm: Normal rate and regular rhythm.     Pulses: Normal pulses.     Heart sounds: Normal heart sounds. No murmur heard.   No friction rub. No gallop.  Pulmonary:     Effort: Pulmonary effort is normal. No respiratory distress.     Breath sounds: Normal breath sounds.  Abdominal:     General: Bowel sounds are normal. There is no distension.     Palpations: Abdomen is soft. There is no hepatomegaly, splenomegaly or mass.     Tenderness: There is no abdominal tenderness.  Musculoskeletal:        General: Normal range of motion.     Cervical back: Normal range of motion and neck supple.     Right lower leg: No edema.     Left lower leg: No edema.  Lymphadenopathy:     Cervical: No cervical adenopathy.  Skin:    General: Skin is warm and dry.  Neurological:     General: No focal deficit present.     Mental Status: She is alert and oriented to person, place, and time. Mental status is at baseline.  Psychiatric:        Mood and Affect: Mood normal.        Behavior: Behavior normal.        Thought Content: Thought content normal.        Judgment: Judgment normal.    LABS:   CBC Latest Ref Rng & Units 09/21/2021 07/23/2021 06/06/2021  WBC - 8.8 8.2 11.1  Hemoglobin 12.0 - 16.0 13.6 15.1 13.9  Hematocrit 36 - 46 39 46 43  Platelets 150 - 399 255 256 338   CMP Latest Ref Rng & Units 09/21/2021 07/23/2021 06/06/2021  Glucose 70  - 99 mg/dL - - -  BUN 4 - 21 11 13 19   Creatinine 0.5 - 1.1 0.8 0.7 0.9  Sodium 137 - 147 138 138 136(A)  Potassium 3.4 - 5.3 3.8 3.4 3.5  Chloride 99 - 108 103 102 97(A)  CO2 13 - 22 27(A) 31(A) 29(A)  Calcium 8.7 - 10.7 9.0 9.0 9.7  Total Protein 6.5 - 8.1 g/dL - - -  Total Bilirubin 0.3 - 1.2 mg/dL - - -  Alkaline Phos 25 - 125 73 77 79  AST 13 - 35 40(A) 40(A) 40(A)  ALT 7 - 35 23 29 33     Lab Results  Component Value Date   CEA1 4.7 07/23/2021   /  CEA  Date Value Ref  Range Status  07/23/2021 4.7 0.0 - 4.7 ng/mL Final    Comment:    (NOTE)                             Nonsmokers          <3.9                             Smokers             <5.6 [employer] Electrochemiluminescence Immunoassay (ECLIA) Values obtained with different assay methods or kits cannot be used interchangeably.  Results cannot be interpreted as absolute evidence of the presence or absence of malignant disease. Performed At: [facility] [place], [place] [id number] Rush Farmer MD HU:[id number]     STUDIES:  No results found.    HISTORY:   Allergies:  Allergies  Allergen Reactions   Aspirin Other (See Comments)    Bleeding (intolerance)   Buspirone Other (See Comments)    Patient does not recall taking this med   Celecoxib Other (See Comments)    Patient cannot remember reaction   Chlorpheniramine Other (See Comments)    Patient cannot remember reaction   Citalopram Other (See Comments)    Patient cannot remember reaction   Ezetimibe Other (See Comments)    Myalgias (intolerance)   Hydrocodone-Acetaminophen Itching   Prochlorperazine Other (See Comments)    "Makes me feel like I'm having a face stroke"   Statins Other (See Comments)    Myalgias (intolerance)   Venlafaxine Other (See Comments)    Patient cannot remember reaction    Current Medications: Current Outpatient Medications  Medication Sig Dispense Refill   albuterol (PROVENTIL)  (2.5 MG/3ML) 0.083% nebulizer solution Take 2.5 mg by nebulization every 6 (six) hours as needed for wheezing or shortness of breath.     albuterol (VENTOLIN HFA) 108 (90 Base) MCG/ACT inhaler Inhale 2 puffs into the lungs every 6 (six) hours as needed for wheezing or shortness of breath.     clonazePAM (KLONOPIN) 1 MG tablet Take 1 mg by mouth every 6 (six) hours as needed for anxiety.     FLUoxetine (PROZAC) 20 MG capsule Take 20 mg by mouth 2 (two) times daily.     gabapentin (NEURONTIN) 300 MG capsule Take 300 mg by mouth at bedtime.     levalbuterol (XOPENEX) 0.63 MG/3ML nebulizer solution SMARTSIG:1 Via Inhaler     montelukast (SINGULAIR) 10 MG tablet Take 1 tablet by mouth at bedtime.     Multiple Vitamin (MULTI-VITAMINS) TABS Take 1 tablet by mouth daily. Centrum silver     omeprazole (PRILOSEC) 40 MG capsule Take 40 mg by mouth daily.     Probiotic Product (PROBIOTIC ADVANCED PO) Take by mouth. 60 billion     simvastatin (ZOCOR) 40 MG tablet Take 20 mg by mouth at bedtime.     SYMBICORT 160-4.5 MCG/ACT inhaler Inhale 2 puffs into the lungs 2 (two) times daily.     No current facility-administered medications for this visit.    I, Rita Ohara, am acting as scribe for Derwood Kaplan, MD  I have reviewed this report as typed by the medical scribe, and it is complete and accurate.

## 2021-09-21 ENCOUNTER — Inpatient Hospital Stay: Payer: Medicare HMO

## 2021-09-21 ENCOUNTER — Other Ambulatory Visit: Payer: Self-pay | Admitting: Hematology and Oncology

## 2021-09-21 ENCOUNTER — Other Ambulatory Visit: Payer: Self-pay | Admitting: Oncology

## 2021-09-21 ENCOUNTER — Encounter: Payer: Self-pay | Admitting: Oncology

## 2021-09-21 ENCOUNTER — Inpatient Hospital Stay: Payer: Medicare HMO | Attending: Oncology | Admitting: Oncology

## 2021-09-21 ENCOUNTER — Other Ambulatory Visit: Payer: Self-pay

## 2021-09-21 VITALS — BP 171/70 | HR 84 | Temp 98.3°F | Resp 18 | Ht 61.0 in | Wt 120.8 lb

## 2021-09-21 DIAGNOSIS — Z886 Allergy status to analgesic agent status: Secondary | ICD-10-CM | POA: Diagnosis not present

## 2021-09-21 DIAGNOSIS — Z79899 Other long term (current) drug therapy: Secondary | ICD-10-CM | POA: Diagnosis not present

## 2021-09-21 DIAGNOSIS — C3431 Malignant neoplasm of lower lobe, right bronchus or lung: Secondary | ICD-10-CM | POA: Insufficient documentation

## 2021-09-21 DIAGNOSIS — C3412 Malignant neoplasm of upper lobe, left bronchus or lung: Secondary | ICD-10-CM | POA: Insufficient documentation

## 2021-09-21 DIAGNOSIS — J449 Chronic obstructive pulmonary disease, unspecified: Secondary | ICD-10-CM | POA: Diagnosis not present

## 2021-09-21 DIAGNOSIS — Z888 Allergy status to other drugs, medicaments and biological substances status: Secondary | ICD-10-CM | POA: Insufficient documentation

## 2021-09-21 DIAGNOSIS — Z9981 Dependence on supplemental oxygen: Secondary | ICD-10-CM | POA: Diagnosis not present

## 2021-09-21 DIAGNOSIS — Z885 Allergy status to narcotic agent status: Secondary | ICD-10-CM | POA: Insufficient documentation

## 2021-09-21 DIAGNOSIS — Z923 Personal history of irradiation: Secondary | ICD-10-CM | POA: Insufficient documentation

## 2021-09-21 LAB — BASIC METABOLIC PANEL
BUN: 11 (ref 4–21)
CO2: 27 — AB (ref 13–22)
Chloride: 103 (ref 99–108)
Creatinine: 0.8 (ref 0.5–1.1)
Glucose: 90
Potassium: 3.8 (ref 3.4–5.3)
Sodium: 138 (ref 137–147)

## 2021-09-21 LAB — CBC AND DIFFERENTIAL
HCT: 39 (ref 36–46)
Hemoglobin: 13.6 (ref 12.0–16.0)
Neutrophils Absolute: 6.16
Platelets: 255 (ref 150–399)
WBC: 8.8

## 2021-09-21 LAB — COMPREHENSIVE METABOLIC PANEL
Albumin: 3.7 (ref 3.5–5.0)
Calcium: 9 (ref 8.7–10.7)

## 2021-09-21 LAB — HEPATIC FUNCTION PANEL
ALT: 23 (ref 7–35)
AST: 40 — AB (ref 13–35)
Alkaline Phosphatase: 73 (ref 25–125)

## 2021-09-21 LAB — CBC
MCV: 91 (ref 81–99)
RBC: 4.34 (ref 3.87–5.11)

## 2021-09-23 LAB — CEA: CEA: 4 ng/mL (ref 0.0–4.7)

## 2021-09-25 ENCOUNTER — Other Ambulatory Visit: Payer: Self-pay | Admitting: Hematology and Oncology

## 2021-09-25 DIAGNOSIS — C3431 Malignant neoplasm of lower lobe, right bronchus or lung: Secondary | ICD-10-CM

## 2021-09-25 IMAGING — CR DG CHEST 2V
2 series · 2 of 2 positions shown · non-contrast
Comparison: 08/12/2019

Correlation: CT chest 08/22/2020

CLINICAL DATA: Preoperative evaluation for lung surgery, history
lung nodules, weight loss, asthma, former smoker

EXAM:
CHEST - 2 VIEW

[w chest pa]
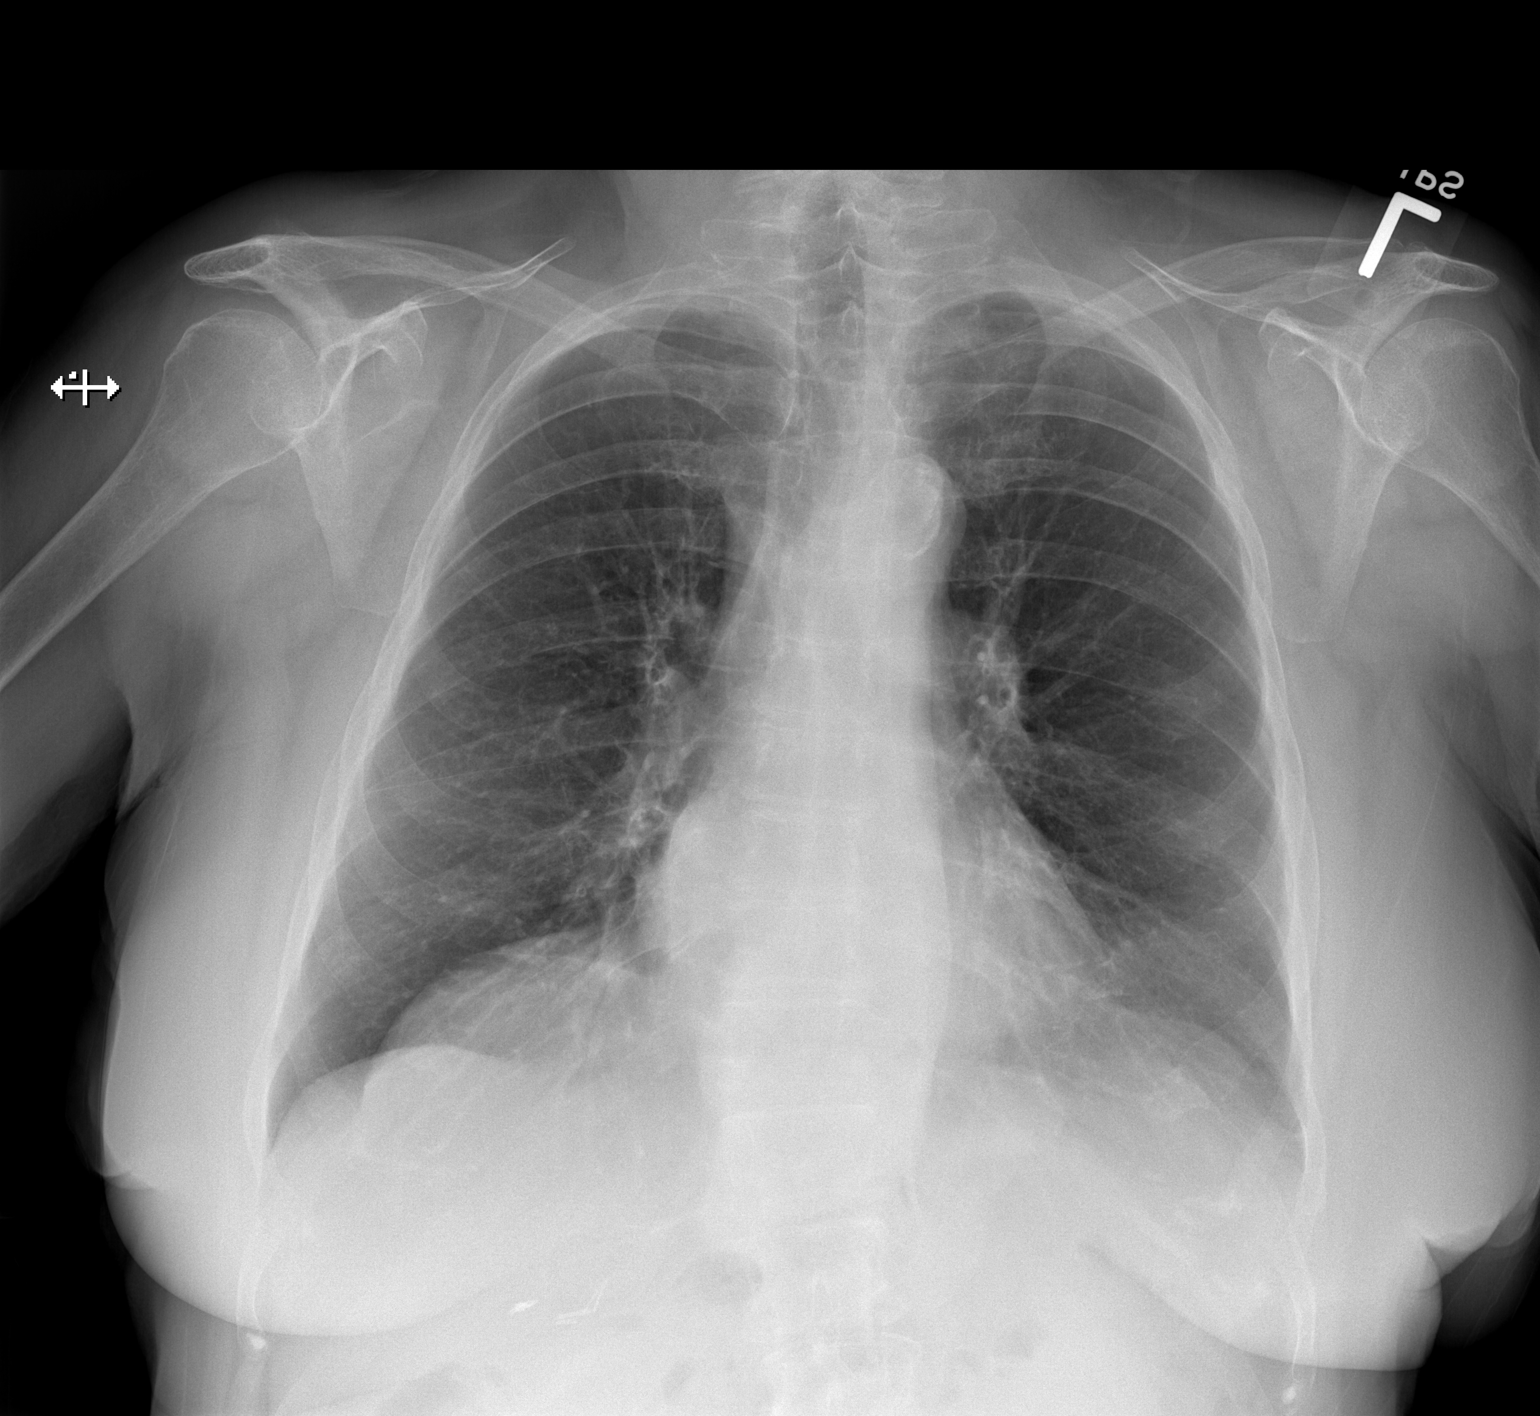

[w chest lat]
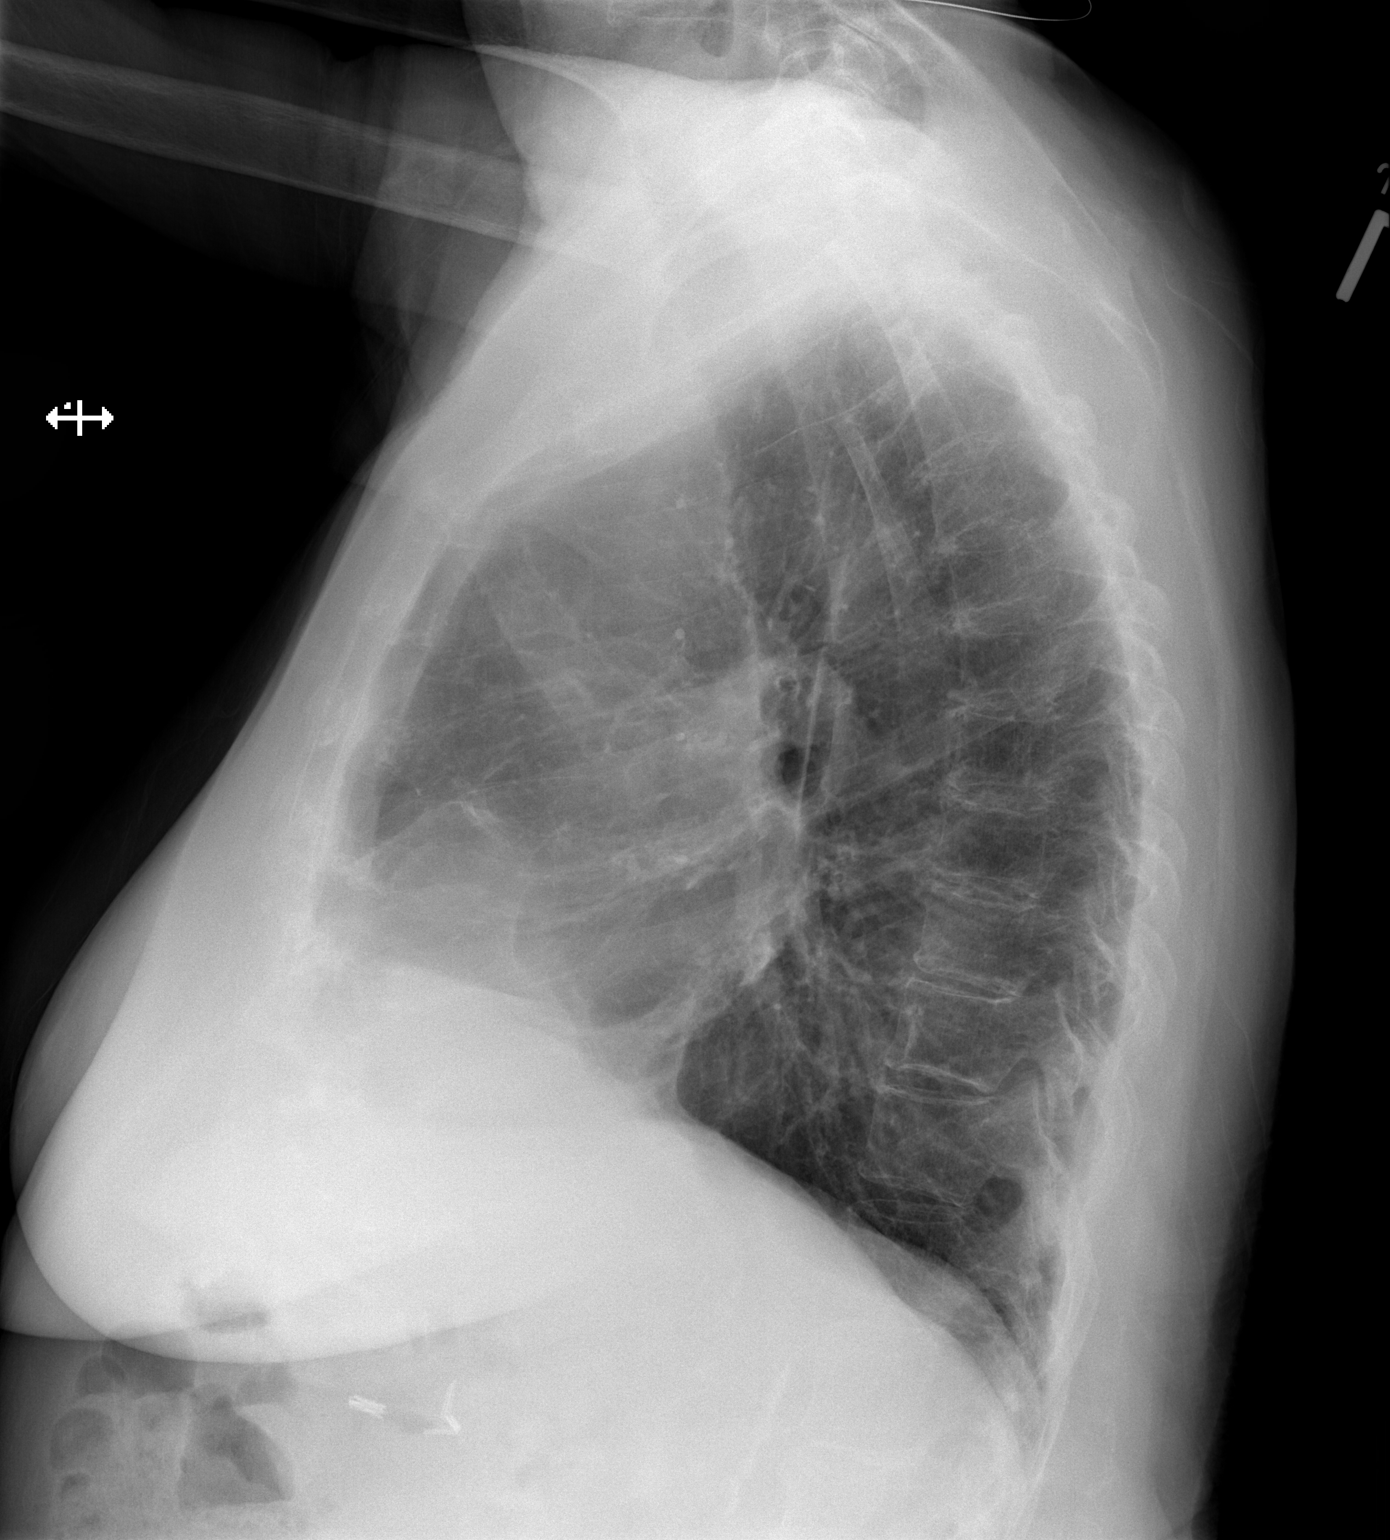

[2 of 2 positions shown; findings below may reference images not displayed]

FINDINGS: Normal heart size, mediastinal contours, and pulmonary vascularity.

Atherosclerotic calcification aorta.

Chronic peribronchial thickening and minimal hyperinflation which
could reflect asthma or COPD.

No acute infiltrate, pleural effusion, or pneumothorax.

Ground-glass nodules identified on the previous exam in both lungs
are not radiographically evident.

Eventration is of the anterior diaphragms noted.

Bones demineralized.
IMPRESSION: Bronchitic changes and minimal hyperinflation question asthma versus
COPD.

No acute infiltrate.

Aortic Atherosclerosis ([id number].C).

## 2021-09-26 ENCOUNTER — Telehealth: Payer: Self-pay | Admitting: Hematology and Oncology

## 2021-09-26 IMAGING — DX DG CHEST 1V SAME DAY
1 series · 1 of 1 positions shown · non-contrast
Comparison: Same day.

CLINICAL DATA: Status post bronchoscopy.

EXAM:
CHEST - 1 VIEW SAME DAY

[chest]
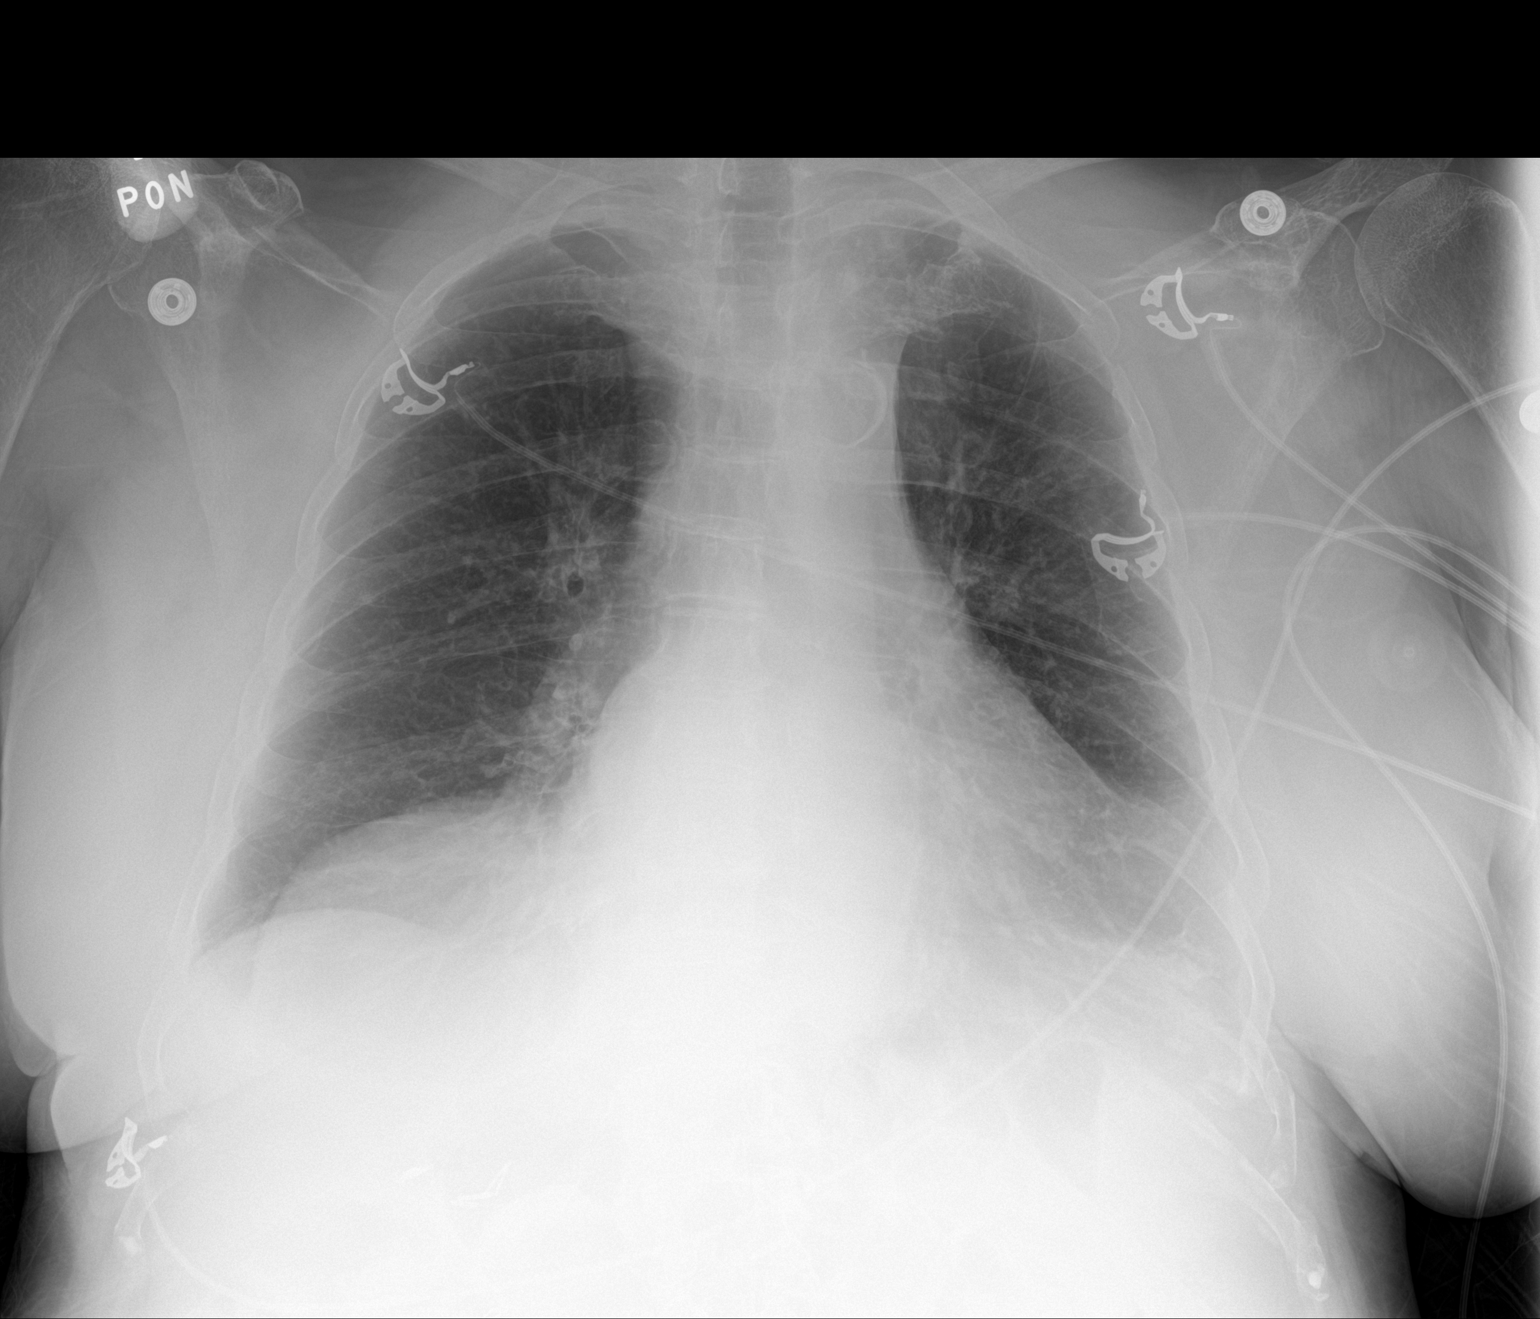

[1 of 1 positions shown; findings below may reference images not displayed]

FINDINGS: Stable cardiomediastinal silhouette. Minimal right apical
pneumothorax is noted which is decreased compared to prior exam.
Left lung is clear. Bony thorax is unremarkable.
IMPRESSION: Minimal right apical pneumothorax is noted which is decreased
compared to prior exam.

Aortic Atherosclerosis (VFOVA-XJ5.5).

## 2021-09-26 NOTE — Telephone Encounter (Signed)
Patient has been scheduled for CT chest w/ contrast for 10/02/21 @ 4:30, check-in @4pm .   Patient aware of appt date, time and instructions.

## 2021-09-27 IMAGING — CR DG CHEST 2V
1 series · 1 of 1 positions shown · non-contrast
Comparison: 08/24/2020

CLINICAL DATA: Follow-up right pneumothorax.

EXAM:
CHEST - 2 VIEW

[chest lat]
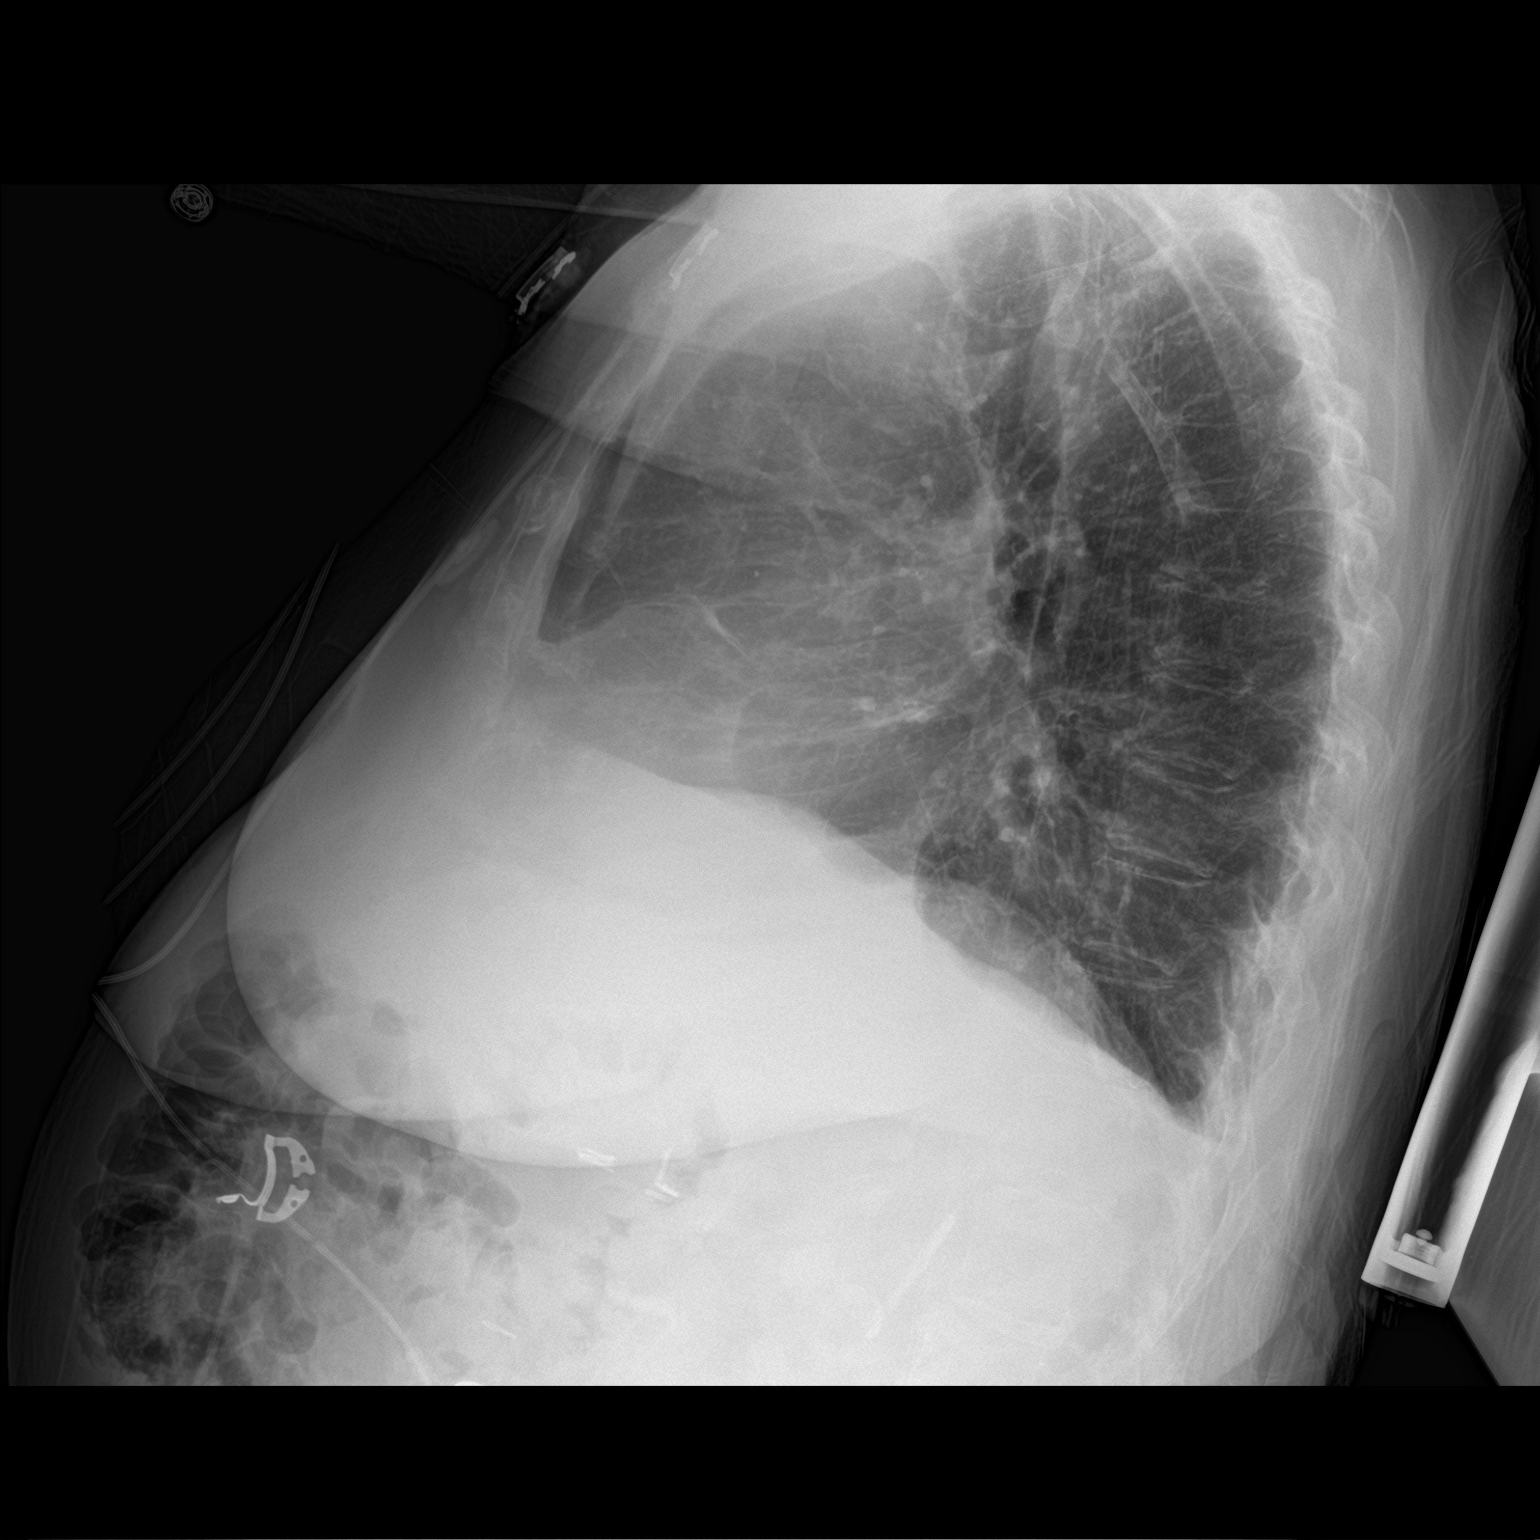

[1 of 1 positions shown; findings below may reference images not displayed]

FINDINGS: Stable cardiomediastinal contours. Aortic atherosclerosis. Right
apical pneumothorax is again noted. This measures approximately 5 mm
in thickness, unchanged. Left lung is clear.
IMPRESSION: No change in right apical pneumothorax.

## 2021-10-02 DIAGNOSIS — I700 Atherosclerosis of aorta: Secondary | ICD-10-CM | POA: Diagnosis not present

## 2021-10-02 DIAGNOSIS — C3431 Malignant neoplasm of lower lobe, right bronchus or lung: Secondary | ICD-10-CM | POA: Diagnosis not present

## 2021-10-03 DIAGNOSIS — J4550 Severe persistent asthma, uncomplicated: Secondary | ICD-10-CM | POA: Diagnosis not present

## 2021-10-04 ENCOUNTER — Telehealth: Payer: Self-pay

## 2021-10-04 NOTE — Telephone Encounter (Addendum)
RE: CT scan results Received: Today Angela Kaplan, MD  Dairl Ponder, RN Called her that it looks good, see scar tissue at both radiated areas, no active cancer     Pt called, & LVM on nurse line requesting a call back regarding test results. "No one has called me and I'm scared to death".  She said she had CT yesterday? I sent message to Dr Hinton Rao @ [phone]

## 2021-10-14 DIAGNOSIS — J45909 Unspecified asthma, uncomplicated: Secondary | ICD-10-CM | POA: Diagnosis not present

## 2021-10-15 DIAGNOSIS — J9610 Chronic respiratory failure, unspecified whether with hypoxia or hypercapnia: Secondary | ICD-10-CM | POA: Diagnosis not present

## 2021-10-25 DIAGNOSIS — F330 Major depressive disorder, recurrent, mild: Secondary | ICD-10-CM | POA: Diagnosis not present

## 2021-10-25 DIAGNOSIS — R69 Illness, unspecified: Secondary | ICD-10-CM | POA: Diagnosis not present

## 2021-10-25 DIAGNOSIS — Z6821 Body mass index (BMI) 21.0-21.9, adult: Secondary | ICD-10-CM | POA: Diagnosis not present

## 2021-10-25 DIAGNOSIS — F411 Generalized anxiety disorder: Secondary | ICD-10-CM | POA: Diagnosis not present

## 2021-10-25 DIAGNOSIS — Z9181 History of falling: Secondary | ICD-10-CM | POA: Diagnosis not present

## 2021-11-11 DIAGNOSIS — J45909 Unspecified asthma, uncomplicated: Secondary | ICD-10-CM | POA: Diagnosis not present

## 2021-11-12 DIAGNOSIS — J9610 Chronic respiratory failure, unspecified whether with hypoxia or hypercapnia: Secondary | ICD-10-CM | POA: Diagnosis not present

## 2021-11-21 DIAGNOSIS — J4550 Severe persistent asthma, uncomplicated: Secondary | ICD-10-CM | POA: Diagnosis not present

## 2021-11-22 DIAGNOSIS — R300 Dysuria: Secondary | ICD-10-CM | POA: Diagnosis not present

## 2021-11-22 DIAGNOSIS — M1990 Unspecified osteoarthritis, unspecified site: Secondary | ICD-10-CM | POA: Diagnosis not present

## 2021-11-22 DIAGNOSIS — R69 Illness, unspecified: Secondary | ICD-10-CM | POA: Diagnosis not present

## 2021-11-22 DIAGNOSIS — Z6822 Body mass index (BMI) 22.0-22.9, adult: Secondary | ICD-10-CM | POA: Diagnosis not present

## 2021-11-26 DIAGNOSIS — J4550 Severe persistent asthma, uncomplicated: Secondary | ICD-10-CM | POA: Diagnosis not present

## 2021-11-26 DIAGNOSIS — J301 Allergic rhinitis due to pollen: Secondary | ICD-10-CM | POA: Diagnosis not present

## 2021-11-26 DIAGNOSIS — R5383 Other fatigue: Secondary | ICD-10-CM | POA: Diagnosis not present

## 2021-11-26 DIAGNOSIS — R69 Illness, unspecified: Secondary | ICD-10-CM | POA: Diagnosis not present

## 2021-11-26 DIAGNOSIS — R918 Other nonspecific abnormal finding of lung field: Secondary | ICD-10-CM | POA: Diagnosis not present

## 2021-12-08 HISTORY — PX: OTHER SURGICAL HISTORY: SHX169

## 2021-12-12 DIAGNOSIS — J45909 Unspecified asthma, uncomplicated: Secondary | ICD-10-CM | POA: Diagnosis not present

## 2021-12-13 DIAGNOSIS — J9610 Chronic respiratory failure, unspecified whether with hypoxia or hypercapnia: Secondary | ICD-10-CM | POA: Diagnosis not present

## 2021-12-18 DIAGNOSIS — R519 Headache, unspecified: Secondary | ICD-10-CM | POA: Diagnosis not present

## 2021-12-18 DIAGNOSIS — S42402A Unspecified fracture of lower end of left humerus, initial encounter for closed fracture: Secondary | ICD-10-CM | POA: Diagnosis not present

## 2021-12-18 DIAGNOSIS — S42412A Displaced simple supracondylar fracture without intercondylar fracture of left humerus, initial encounter for closed fracture: Secondary | ICD-10-CM | POA: Diagnosis not present

## 2021-12-18 DIAGNOSIS — Z01818 Encounter for other preprocedural examination: Secondary | ICD-10-CM | POA: Diagnosis not present

## 2021-12-18 DIAGNOSIS — Z043 Encounter for examination and observation following other accident: Secondary | ICD-10-CM | POA: Diagnosis not present

## 2021-12-18 DIAGNOSIS — S0990XA Unspecified injury of head, initial encounter: Secondary | ICD-10-CM | POA: Diagnosis not present

## 2021-12-18 DIAGNOSIS — C3490 Malignant neoplasm of unspecified part of unspecified bronchus or lung: Secondary | ICD-10-CM | POA: Diagnosis not present

## 2021-12-18 DIAGNOSIS — S51012A Laceration without foreign body of left elbow, initial encounter: Secondary | ICD-10-CM | POA: Diagnosis not present

## 2021-12-18 DIAGNOSIS — M25522 Pain in left elbow: Secondary | ICD-10-CM | POA: Diagnosis not present

## 2021-12-18 DIAGNOSIS — E119 Type 2 diabetes mellitus without complications: Secondary | ICD-10-CM | POA: Diagnosis not present

## 2021-12-18 DIAGNOSIS — S81011A Laceration without foreign body, right knee, initial encounter: Secondary | ICD-10-CM | POA: Diagnosis not present

## 2021-12-18 DIAGNOSIS — W010XXA Fall on same level from slipping, tripping and stumbling without subsequent striking against object, initial encounter: Secondary | ICD-10-CM | POA: Diagnosis not present

## 2021-12-18 DIAGNOSIS — M542 Cervicalgia: Secondary | ICD-10-CM | POA: Diagnosis not present

## 2021-12-18 DIAGNOSIS — M25561 Pain in right knee: Secondary | ICD-10-CM | POA: Diagnosis not present

## 2021-12-18 DIAGNOSIS — Z23 Encounter for immunization: Secondary | ICD-10-CM | POA: Diagnosis not present

## 2021-12-20 ENCOUNTER — Other Ambulatory Visit: Payer: Medicare HMO

## 2021-12-20 ENCOUNTER — Ambulatory Visit: Payer: Medicare HMO | Admitting: Hematology and Oncology

## 2021-12-24 ENCOUNTER — Ambulatory Visit: Payer: Medicare HMO | Admitting: Hematology and Oncology

## 2021-12-24 ENCOUNTER — Other Ambulatory Visit: Payer: Medicare HMO

## 2021-12-25 DIAGNOSIS — S42402A Unspecified fracture of lower end of left humerus, initial encounter for closed fracture: Secondary | ICD-10-CM | POA: Diagnosis not present

## 2021-12-26 DIAGNOSIS — I10 Essential (primary) hypertension: Secondary | ICD-10-CM | POA: Diagnosis not present

## 2021-12-26 DIAGNOSIS — S42402A Unspecified fracture of lower end of left humerus, initial encounter for closed fracture: Secondary | ICD-10-CM | POA: Diagnosis not present

## 2021-12-26 DIAGNOSIS — Z7951 Long term (current) use of inhaled steroids: Secondary | ICD-10-CM | POA: Diagnosis not present

## 2021-12-26 DIAGNOSIS — Z9981 Dependence on supplemental oxygen: Secondary | ICD-10-CM | POA: Diagnosis not present

## 2021-12-26 DIAGNOSIS — W1830XA Fall on same level, unspecified, initial encounter: Secondary | ICD-10-CM | POA: Diagnosis not present

## 2021-12-26 DIAGNOSIS — J45909 Unspecified asthma, uncomplicated: Secondary | ICD-10-CM | POA: Diagnosis not present

## 2021-12-26 DIAGNOSIS — S42462A Displaced fracture of medial condyle of left humerus, initial encounter for closed fracture: Secondary | ICD-10-CM | POA: Diagnosis not present

## 2021-12-26 DIAGNOSIS — Z87891 Personal history of nicotine dependence: Secondary | ICD-10-CM | POA: Diagnosis not present

## 2021-12-26 DIAGNOSIS — J449 Chronic obstructive pulmonary disease, unspecified: Secondary | ICD-10-CM | POA: Diagnosis not present

## 2021-12-26 DIAGNOSIS — S52572A Other intraarticular fracture of lower end of left radius, initial encounter for closed fracture: Secondary | ICD-10-CM | POA: Diagnosis not present

## 2021-12-26 DIAGNOSIS — E119 Type 2 diabetes mellitus without complications: Secondary | ICD-10-CM | POA: Diagnosis not present

## 2022-01-07 ENCOUNTER — Ambulatory Visit: Payer: Medicare HMO | Admitting: Hematology and Oncology

## 2022-01-07 ENCOUNTER — Other Ambulatory Visit: Payer: Medicare HMO

## 2022-01-08 DIAGNOSIS — S42402A Unspecified fracture of lower end of left humerus, initial encounter for closed fracture: Secondary | ICD-10-CM | POA: Diagnosis not present

## 2022-01-09 ENCOUNTER — Encounter: Payer: Self-pay | Admitting: Hematology and Oncology

## 2022-01-09 ENCOUNTER — Other Ambulatory Visit: Payer: Self-pay | Admitting: Hematology and Oncology

## 2022-01-09 ENCOUNTER — Inpatient Hospital Stay: Payer: Medicare HMO | Attending: Hematology and Oncology

## 2022-01-09 ENCOUNTER — Telehealth: Payer: Self-pay | Admitting: Oncology

## 2022-01-09 ENCOUNTER — Inpatient Hospital Stay: Payer: Medicare HMO | Admitting: Hematology and Oncology

## 2022-01-09 DIAGNOSIS — Z9981 Dependence on supplemental oxygen: Secondary | ICD-10-CM | POA: Insufficient documentation

## 2022-01-09 DIAGNOSIS — Z79899 Other long term (current) drug therapy: Secondary | ICD-10-CM | POA: Insufficient documentation

## 2022-01-09 DIAGNOSIS — C3431 Malignant neoplasm of lower lobe, right bronchus or lung: Secondary | ICD-10-CM | POA: Diagnosis not present

## 2022-01-09 DIAGNOSIS — J449 Chronic obstructive pulmonary disease, unspecified: Secondary | ICD-10-CM | POA: Diagnosis not present

## 2022-01-09 DIAGNOSIS — Z886 Allergy status to analgesic agent status: Secondary | ICD-10-CM | POA: Diagnosis not present

## 2022-01-09 DIAGNOSIS — C3412 Malignant neoplasm of upper lobe, left bronchus or lung: Secondary | ICD-10-CM | POA: Diagnosis not present

## 2022-01-09 DIAGNOSIS — Z888 Allergy status to other drugs, medicaments and biological substances status: Secondary | ICD-10-CM | POA: Diagnosis not present

## 2022-01-09 DIAGNOSIS — Z885 Allergy status to narcotic agent status: Secondary | ICD-10-CM | POA: Diagnosis not present

## 2022-01-09 DIAGNOSIS — D649 Anemia, unspecified: Secondary | ICD-10-CM | POA: Diagnosis not present

## 2022-01-09 LAB — COMPREHENSIVE METABOLIC PANEL
Albumin: 4.2 (ref 3.5–5.0)
Calcium: 9.4 (ref 8.7–10.7)

## 2022-01-09 LAB — BASIC METABOLIC PANEL
BUN: 12 (ref 4–21)
CO2: 28 — AB (ref 13–22)
Chloride: 100 (ref 99–108)
Creatinine: 0.8 (ref 0.5–1.1)
Glucose: 107
Potassium: 3.7 mEq/L (ref 3.5–5.1)
Sodium: 138 (ref 137–147)

## 2022-01-09 LAB — CBC AND DIFFERENTIAL
HCT: 41 (ref 36–46)
Hemoglobin: 13.2 (ref 12.0–16.0)
Neutrophils Absolute: 7.85
Platelets: 472 10*3/uL — AB (ref 150–400)
WBC: 10.9

## 2022-01-09 LAB — HEPATIC FUNCTION PANEL
ALT: 21 U/L (ref 7–35)
AST: 31 (ref 13–35)
Alkaline Phosphatase: 133 — AB (ref 25–125)
Bilirubin, Total: 0.8

## 2022-01-09 LAB — CBC: RBC: 4.61 (ref 3.87–5.11)

## 2022-01-09 NOTE — Telephone Encounter (Signed)
Per 01/09/22 los next appt scheduled and confirmed with patient ?

## 2022-01-09 NOTE — Progress Notes (Addendum)
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 Bronchoscopy with biopsy at that time revealed atypical cells. She was deemed a poor surgical candidate due to multiple comorbidities including severe oxygen dependent COPD. She completed stereotactic radiation therapy to both lesions in late February 2022 without difficulty. Follow up imaging and office visit in July with Dr. Sondra Come revealed her to be stable with no suspicious areas and expected radiation changes. We will continue to monitor this with routine imaging, and she has requested that we take over her follow up. Most recent CT imaging in January revealed post treatment changes without new or progressive findings. We will plan to see her back in 3 months for repeat evaluation.    The patient understands the plans discussed today and is in agreement with them.  She knows to contact our office if she develops concerns prior to her next appointment.    Melodye Ped, NP  [place] [address] [place] [address] Dept: [phone] Dept Fax: [phone]   Orders Placed This Encounter  Procedures   CBC and differential    This external order was created through the Results Console.   CBC    This external order was created through the Results Console.      CHIEF COMPLAINT:  CC: A 76 year old  female with history of lung cancer here for 3 month evaluation  Current Treatment:  Surveillance  INTERVAL HISTORY:  Fatime is here today for repeat clinical assessment. She denies fevers or chills. She denies pain. Her appetite is good. Her weight has decreased 5 pounds over last 3 months .  I have reviewed the past medical history, past surgical history, social history and family history with the patient and they are unchanged from previous note.  ALLERGIES:  is allergic to aspirin, buspirone, celecoxib, chlorpheniramine, citalopram, ezetimibe, hydrocodone-acetaminophen, prochlorperazine, statins, and venlafaxine.  MEDICATIONS:  Current Outpatient Medications  Medication Sig Dispense Refill   ciprofloxacin (CIPRO) 250 MG tablet Take 250 mg by mouth 2 (two) times daily.     albuterol (PROVENTIL) (2.5 MG/3ML) 0.083% nebulizer solution Take 2.5 mg by nebulization every 6 (six) hours as needed for wheezing or shortness of breath.     albuterol (VENTOLIN HFA) 108 (90 Base) MCG/ACT inhaler Inhale 2 puffs into the lungs every 6 (six) hours as needed for wheezing or shortness of breath.     clonazePAM (KLONOPIN) 1 MG tablet Take 1 mg by mouth every 6 (six) hours as needed for anxiety.     FLUoxetine (PROZAC) 20 MG capsule Take 20 mg by mouth 2 (two) times daily.     gabapentin (NEURONTIN) 300 MG capsule Take 300 mg by mouth at bedtime.     levalbuterol (XOPENEX) 0.63 MG/3ML nebulizer solution SMARTSIG:1 Via Inhaler     losartan-hydrochlorothiazide (HYZAAR) 50-12.5 MG tablet Take 1 tablet by mouth daily.     meloxicam (MOBIC) 7.5 MG tablet Take 7.5 mg by mouth  2 (two) times daily.     mirtazapine (REMERON) 7.5 MG tablet Take 7.5 mg by mouth at bedtime.     montelukast (SINGULAIR) 10 MG tablet Take 1 tablet by mouth at bedtime.     Multiple Vitamin (MULTI-VITAMINS) TABS Take 1 tablet by mouth daily. Centrum silver     omeprazole (PRILOSEC) 40 MG capsule Take 40 mg by mouth daily.     ondansetron  (ZOFRAN) 4 MG tablet Take 4 mg by mouth every 4 (four) hours as needed.     Probiotic Product (PROBIOTIC ADVANCED PO) Take by mouth. 60 billion     simvastatin (ZOCOR) 40 MG tablet Take 20 mg by mouth at bedtime.     SYMBICORT 160-4.5 MCG/ACT inhaler Inhale 2 puffs into the lungs 2 (two) times daily.     No current facility-administered medications for this visit.    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[place])  10/11/2020 Initial Diagnosis   Cancer of upper lobe of left lung ([place])    10/11/2020 Cancer Staging   Staging form: Lung, AJCC 8th Edition - Clinical stage from 10/11/2020: Stage IA2 (cT1b, cN0, cM0) - Signed by Derwood Kaplan, MD on 06/10/2021 Histopathologic type: Adenocarcinoma, NOS Stage prefix: Initial diagnosis Histologic grade (G): GX Histologic grading system: 4 grade system Laterality: Left Tumor size (mm): 16 Lymph-vascular invasion (LVI): Presence of LVI unknown/indeterminate Diagnostic confirmation: Radiography and other imaging techniques without microscopic confirmation Staged by: Managing physician Stage used in treatment planning: Yes National guidelines used in treatment planning: Yes Type of national guideline used in treatment planning: NCCN Staging comments: SBRT    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[place])  10/11/2020 Initial Diagnosis   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[place])    10/11/2020 Cancer Staging   Staging form: Lung, AJCC 8th Edition - Clinical stage from 10/11/2020: Stage IA3 (cT1c, cN0, cM0) - Signed by Derwood Kaplan, MD on 06/10/2021 Stage prefix: Initial diagnosis Histologic grade (G): GX Histologic grading system: 4 grade system Laterality: Right Tumor size (mm): 22 Lymph-vascular invasion (LVI): Presence of LVI unknown/indeterminate Diagnostic confirmation: Radiography and other imaging techniques without microscopic confirmation Staged by: Managing physician Stage used in  treatment planning: Yes National guidelines used in treatment planning: Yes Type of national guideline used in treatment planning: NCCN        REVIEW OF SYSTEMS:   Constitutional: Denies fevers, chills or abnormal weight loss Eyes: Denies blurriness of vision Ears, nose, mouth, throat, and face: Denies mucositis or sore throat Respiratory: Denies cough, dyspnea or wheezes Cardiovascular: Denies palpitation, chest discomfort or lower extremity swelling Gastrointestinal:  Denies nausea, heartburn or change in bowel habits Skin: Denies abnormal skin rashes Lymphatics: Denies new lymphadenopathy or easy bruising Neurological:Denies numbness, tingling or new weaknesses Behavioral/Psych: Mood is stable, no new changes  All other systems were reviewed with the patient and are negative.   VITALS:  Blood pressure 122/70, pulse 97, temperature (!) 97.5 F (36.4 C), temperature source Oral, resp. rate 20, height 5\' 1"  (1.549 m), weight 115 lb 1.6 oz (52.2 kg), SpO2 94 %.  Wt Readings from Last 3 Encounters:  01/09/22 115 lb 1.6 oz (52.2 kg)  09/21/21 120 lb 12.8 oz (54.8 kg)  07/23/21 119 lb 4.8 oz (54.1 kg)    Body mass index is 21.75 kg/m.  Performance status (ECOG): 1 - Symptomatic but completely ambulatory  PHYSICAL EXAM:   GENERAL:alert, no distress and comfortable  SKIN: skin color, texture, turgor are normal, no rashes or significant lesions EYES: normal, Conjunctiva are pink and non-injected, sclera clear OROPHARYNX:no exudate, no erythema and lips, buccal mucosa, and tongue normal  NECK: supple, thyroid normal size, non-tender, without nodularity LYMPH:  no palpable lymphadenopathy in the cervical, axillary or inguinal LUNGS: clear to auscultation and percussion with normal breathing effort HEART: regular rate & rhythm and no murmurs and no lower extremity edema ABDOMEN:abdomen soft, non-tender and normal bowel sounds Musculoskeletal:no cyanosis of digits and no clubbing   NEURO: alert & oriented x 3 with fluent speech, no focal motor/sensory deficits  LABORATORY DATA:  I have reviewed the data as listed    Component Value Date/Time   NA 138 01/09/2022 0000   K 3.7 01/09/2022 0000   CL 100 01/09/2022 0000   CO2 28 (A) 01/09/2022 0000   GLUCOSE 106 (H) 08/23/2020 1022   BUN 12 01/09/2022 0000   CREATININE 0.8 01/09/2022 0000   CREATININE 0.82 08/23/2020 1022   CALCIUM 9.4 01/09/2022 0000   PROT 6.5 08/23/2020 1022   PROT 6.3 08/02/2020 1233   ALBUMIN 4.2 01/09/2022 0000   ALBUMIN 4.1 08/02/2020 1233   AST 31 01/09/2022 0000   ALT 21 01/09/2022 0000   ALKPHOS 133 (A) 01/09/2022 0000   BILITOT 0.8 08/23/2020 1022   BILITOT 0.3 08/02/2020 1233   GFRNONAA >60 08/23/2020 1022   GFRAA 93 08/02/2020 1233    No results found for: SPEP, UPEP  Lab Results  Component Value Date   WBC 10.9 01/09/2022   NEUTROABS 7.85 01/09/2022   HGB 13.2 01/09/2022   HCT 41 01/09/2022   MCV 91 09/21/2021   PLT 472 (A) 01/09/2022      Chemistry      Component Value Date/Time   NA 138 01/09/2022 0000   K 3.7 01/09/2022 0000   CL 100 01/09/2022 0000   CO2 28 (A) 01/09/2022 0000   BUN 12 01/09/2022 0000   CREATININE 0.8 01/09/2022 0000   CREATININE 0.82 08/23/2020 1022   GLU 107 01/09/2022 0000      Component Value Date/Time   CALCIUM 9.4 01/09/2022 0000   ALKPHOS 133 (A) 01/09/2022 0000   AST 31 01/09/2022 0000   ALT 21 01/09/2022 0000   BILITOT 0.8 08/23/2020 1022   BILITOT 0.3 08/02/2020 1233       RADIOGRAPHIC STUDIES: I have personally reviewed the radiological images as listed and agreed with the findings in the report.

## 2022-01-09 NOTE — Assessment & Plan Note (Addendum)
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 Bronchoscopy with biopsy at that time revealed atypical cells. She was deemed a poor surgical candidate due to multiple comorbidities including severe?oxygen dependent?COPD. She completed?stereotactic?radiation therapy?to both lesions?in late February?2022 without difficulty. Follow up imaging and office visit in July with Dr. Sondra Come revealed her to be stable with no suspicious areas and expected radiation changes.?We will continue to monitor this with routine imaging, and she has requested that we take over her follow up. Most recent CT imaging in January revealed post treatment changes without new or progressive findings. We will plan to see her back in 3 months for repeat evaluation.  ?

## 2022-01-10 ENCOUNTER — Telehealth: Payer: Self-pay

## 2022-01-10 ENCOUNTER — Encounter: Payer: Self-pay | Admitting: Hematology and Oncology

## 2022-01-10 NOTE — Telephone Encounter (Addendum)
01/16/22 Pt answered, and notified of below. ?01/11/22 @ 1050 - Dr Hinton Rao asked me to tell the patient that the cancer marker is normal. I attempted call to pt, no answer. ?01/10/22@ 1604 - Cancer marker still not resulted, pt notified. ?01/10/22@ 1430 - Cancer marker has not resulted as of yet. ?

## 2022-01-11 DIAGNOSIS — J45909 Unspecified asthma, uncomplicated: Secondary | ICD-10-CM | POA: Diagnosis not present

## 2022-01-11 LAB — CEA: CEA: 2.3 ng/mL (ref 0.0–4.7)

## 2022-01-12 DIAGNOSIS — J9610 Chronic respiratory failure, unspecified whether with hypoxia or hypercapnia: Secondary | ICD-10-CM | POA: Diagnosis not present

## 2022-01-15 ENCOUNTER — Telehealth: Payer: Self-pay | Admitting: Dietician

## 2022-01-15 DIAGNOSIS — J4550 Severe persistent asthma, uncomplicated: Secondary | ICD-10-CM | POA: Diagnosis not present

## 2022-01-15 NOTE — Telephone Encounter (Signed)
Patient screened on MST. First attempt to reach. Provided my office# on voice mail to return call to set up a nutrition consult. ? ?Angela Phillips, RDN, LDN ?Registered Dietitian, [place] ?Part Time Remote (Usual office hours: Tuesday-Thursday) ?Office: [phone] ?

## 2022-02-06 DIAGNOSIS — S42402A Unspecified fracture of lower end of left humerus, initial encounter for closed fracture: Secondary | ICD-10-CM | POA: Diagnosis not present

## 2022-02-11 DIAGNOSIS — J45909 Unspecified asthma, uncomplicated: Secondary | ICD-10-CM | POA: Diagnosis not present

## 2022-02-11 DIAGNOSIS — J4550 Severe persistent asthma, uncomplicated: Secondary | ICD-10-CM | POA: Diagnosis not present

## 2022-02-11 DIAGNOSIS — R5383 Other fatigue: Secondary | ICD-10-CM | POA: Diagnosis not present

## 2022-02-11 DIAGNOSIS — R69 Illness, unspecified: Secondary | ICD-10-CM | POA: Diagnosis not present

## 2022-02-11 DIAGNOSIS — R918 Other nonspecific abnormal finding of lung field: Secondary | ICD-10-CM | POA: Diagnosis not present

## 2022-02-12 DIAGNOSIS — J9610 Chronic respiratory failure, unspecified whether with hypoxia or hypercapnia: Secondary | ICD-10-CM | POA: Diagnosis not present

## 2022-02-13 DIAGNOSIS — S0990XA Unspecified injury of head, initial encounter: Secondary | ICD-10-CM | POA: Diagnosis not present

## 2022-02-13 DIAGNOSIS — E119 Type 2 diabetes mellitus without complications: Secondary | ICD-10-CM | POA: Diagnosis not present

## 2022-02-13 DIAGNOSIS — F419 Anxiety disorder, unspecified: Secondary | ICD-10-CM | POA: Diagnosis not present

## 2022-02-13 DIAGNOSIS — W010XXA Fall on same level from slipping, tripping and stumbling without subsequent striking against object, initial encounter: Secondary | ICD-10-CM | POA: Diagnosis not present

## 2022-02-13 DIAGNOSIS — J45909 Unspecified asthma, uncomplicated: Secondary | ICD-10-CM | POA: Diagnosis not present

## 2022-02-13 DIAGNOSIS — W19XXXA Unspecified fall, initial encounter: Secondary | ICD-10-CM | POA: Diagnosis not present

## 2022-02-13 DIAGNOSIS — S62643A Nondisplaced fracture of proximal phalanx of left middle finger, initial encounter for closed fracture: Secondary | ICD-10-CM | POA: Diagnosis not present

## 2022-02-13 DIAGNOSIS — I672 Cerebral atherosclerosis: Secondary | ICD-10-CM | POA: Diagnosis not present

## 2022-02-13 DIAGNOSIS — Z885 Allergy status to narcotic agent status: Secondary | ICD-10-CM | POA: Diagnosis not present

## 2022-02-13 DIAGNOSIS — K219 Gastro-esophageal reflux disease without esophagitis: Secondary | ICD-10-CM | POA: Diagnosis not present

## 2022-02-13 DIAGNOSIS — S199XXA Unspecified injury of neck, initial encounter: Secondary | ICD-10-CM | POA: Diagnosis not present

## 2022-02-13 DIAGNOSIS — R52 Pain, unspecified: Secondary | ICD-10-CM | POA: Diagnosis not present

## 2022-02-13 DIAGNOSIS — M79652 Pain in left thigh: Secondary | ICD-10-CM | POA: Diagnosis not present

## 2022-02-13 DIAGNOSIS — Z923 Personal history of irradiation: Secondary | ICD-10-CM | POA: Diagnosis not present

## 2022-02-13 DIAGNOSIS — I4891 Unspecified atrial fibrillation: Secondary | ICD-10-CM | POA: Diagnosis not present

## 2022-02-13 DIAGNOSIS — S72052A Unspecified fracture of head of left femur, initial encounter for closed fracture: Secondary | ICD-10-CM | POA: Diagnosis not present

## 2022-02-13 DIAGNOSIS — M4319 Spondylolisthesis, multiple sites in spine: Secondary | ICD-10-CM | POA: Diagnosis not present

## 2022-02-13 DIAGNOSIS — M79605 Pain in left leg: Secondary | ICD-10-CM | POA: Diagnosis not present

## 2022-02-13 DIAGNOSIS — F32A Depression, unspecified: Secondary | ICD-10-CM | POA: Diagnosis not present

## 2022-02-13 DIAGNOSIS — S72002A Fracture of unspecified part of neck of left femur, initial encounter for closed fracture: Secondary | ICD-10-CM | POA: Diagnosis not present

## 2022-02-13 DIAGNOSIS — S72012A Unspecified intracapsular fracture of left femur, initial encounter for closed fracture: Secondary | ICD-10-CM | POA: Diagnosis not present

## 2022-02-13 DIAGNOSIS — S62613A Displaced fracture of proximal phalanx of left middle finger, initial encounter for closed fracture: Secondary | ICD-10-CM | POA: Diagnosis not present

## 2022-02-13 DIAGNOSIS — M25551 Pain in right hip: Secondary | ICD-10-CM | POA: Diagnosis not present

## 2022-02-13 DIAGNOSIS — I10 Essential (primary) hypertension: Secondary | ICD-10-CM | POA: Diagnosis not present

## 2022-02-13 DIAGNOSIS — M1990 Unspecified osteoarthritis, unspecified site: Secondary | ICD-10-CM | POA: Diagnosis not present

## 2022-02-13 DIAGNOSIS — Z043 Encounter for examination and observation following other accident: Secondary | ICD-10-CM | POA: Diagnosis not present

## 2022-02-13 DIAGNOSIS — Z85118 Personal history of other malignant neoplasm of bronchus and lung: Secondary | ICD-10-CM | POA: Diagnosis not present

## 2022-02-13 DIAGNOSIS — I6523 Occlusion and stenosis of bilateral carotid arteries: Secondary | ICD-10-CM | POA: Diagnosis not present

## 2022-02-13 DIAGNOSIS — R609 Edema, unspecified: Secondary | ICD-10-CM | POA: Diagnosis not present

## 2022-02-13 DIAGNOSIS — Z886 Allergy status to analgesic agent status: Secondary | ICD-10-CM | POA: Diagnosis not present

## 2022-02-13 DIAGNOSIS — E7800 Pure hypercholesterolemia, unspecified: Secondary | ICD-10-CM | POA: Diagnosis not present

## 2022-02-13 DIAGNOSIS — M47812 Spondylosis without myelopathy or radiculopathy, cervical region: Secondary | ICD-10-CM | POA: Diagnosis not present

## 2022-02-13 DIAGNOSIS — I2510 Atherosclerotic heart disease of native coronary artery without angina pectoris: Secondary | ICD-10-CM | POA: Diagnosis not present

## 2022-02-13 DIAGNOSIS — M25522 Pain in left elbow: Secondary | ICD-10-CM | POA: Diagnosis not present

## 2022-02-13 DIAGNOSIS — Z96642 Presence of left artificial hip joint: Secondary | ICD-10-CM | POA: Diagnosis not present

## 2022-02-13 DIAGNOSIS — Z888 Allergy status to other drugs, medicaments and biological substances status: Secondary | ICD-10-CM | POA: Diagnosis not present

## 2022-02-14 DIAGNOSIS — I10 Essential (primary) hypertension: Secondary | ICD-10-CM | POA: Diagnosis not present

## 2022-02-14 DIAGNOSIS — E119 Type 2 diabetes mellitus without complications: Secondary | ICD-10-CM | POA: Diagnosis not present

## 2022-02-14 DIAGNOSIS — S72012A Unspecified intracapsular fracture of left femur, initial encounter for closed fracture: Secondary | ICD-10-CM | POA: Diagnosis not present

## 2022-02-22 DIAGNOSIS — E785 Hyperlipidemia, unspecified: Secondary | ICD-10-CM | POA: Diagnosis not present

## 2022-02-22 DIAGNOSIS — F32A Depression, unspecified: Secondary | ICD-10-CM | POA: Diagnosis not present

## 2022-02-22 DIAGNOSIS — S72002D Fracture of unspecified part of neck of left femur, subsequent encounter for closed fracture with routine healing: Secondary | ICD-10-CM | POA: Diagnosis not present

## 2022-02-22 DIAGNOSIS — I10 Essential (primary) hypertension: Secondary | ICD-10-CM | POA: Diagnosis not present

## 2022-02-22 DIAGNOSIS — K449 Diaphragmatic hernia without obstruction or gangrene: Secondary | ICD-10-CM | POA: Diagnosis not present

## 2022-02-22 DIAGNOSIS — J4540 Moderate persistent asthma, uncomplicated: Secondary | ICD-10-CM | POA: Diagnosis not present

## 2022-02-22 DIAGNOSIS — I480 Paroxysmal atrial fibrillation: Secondary | ICD-10-CM | POA: Diagnosis not present

## 2022-02-22 DIAGNOSIS — F419 Anxiety disorder, unspecified: Secondary | ICD-10-CM | POA: Diagnosis not present

## 2022-02-22 DIAGNOSIS — J449 Chronic obstructive pulmonary disease, unspecified: Secondary | ICD-10-CM | POA: Diagnosis not present

## 2022-02-22 DIAGNOSIS — R296 Repeated falls: Secondary | ICD-10-CM | POA: Diagnosis not present

## 2022-02-22 DIAGNOSIS — K219 Gastro-esophageal reflux disease without esophagitis: Secondary | ICD-10-CM | POA: Diagnosis not present

## 2022-02-22 DIAGNOSIS — E119 Type 2 diabetes mellitus without complications: Secondary | ICD-10-CM | POA: Diagnosis not present

## 2022-02-22 DIAGNOSIS — C3490 Malignant neoplasm of unspecified part of unspecified bronchus or lung: Secondary | ICD-10-CM | POA: Diagnosis not present

## 2022-02-28 DIAGNOSIS — S62619A Displaced fracture of proximal phalanx of unspecified finger, initial encounter for closed fracture: Secondary | ICD-10-CM | POA: Diagnosis not present

## 2022-03-13 DIAGNOSIS — J45909 Unspecified asthma, uncomplicated: Secondary | ICD-10-CM | POA: Diagnosis not present

## 2022-03-14 DIAGNOSIS — J9610 Chronic respiratory failure, unspecified whether with hypoxia or hypercapnia: Secondary | ICD-10-CM | POA: Diagnosis not present

## 2022-03-20 DIAGNOSIS — J4550 Severe persistent asthma, uncomplicated: Secondary | ICD-10-CM | POA: Diagnosis not present

## 2022-04-03 DIAGNOSIS — S42402A Unspecified fracture of lower end of left humerus, initial encounter for closed fracture: Secondary | ICD-10-CM | POA: Diagnosis not present

## 2022-04-11 ENCOUNTER — Other Ambulatory Visit: Payer: Self-pay | Admitting: Oncology

## 2022-04-11 ENCOUNTER — Inpatient Hospital Stay: Payer: Medicare HMO

## 2022-04-11 ENCOUNTER — Encounter: Payer: Self-pay | Admitting: Oncology

## 2022-04-11 ENCOUNTER — Inpatient Hospital Stay: Payer: Medicare HMO | Attending: Hematology and Oncology | Admitting: Oncology

## 2022-04-11 VITALS — BP 118/87 | HR 82 | Temp 98.0°F | Resp 17 | Ht 61.0 in | Wt 106.9 lb

## 2022-04-11 DIAGNOSIS — Z79899 Other long term (current) drug therapy: Secondary | ICD-10-CM | POA: Insufficient documentation

## 2022-04-11 DIAGNOSIS — Z888 Allergy status to other drugs, medicaments and biological substances status: Secondary | ICD-10-CM | POA: Insufficient documentation

## 2022-04-11 DIAGNOSIS — J449 Chronic obstructive pulmonary disease, unspecified: Secondary | ICD-10-CM | POA: Insufficient documentation

## 2022-04-11 DIAGNOSIS — Z9981 Dependence on supplemental oxygen: Secondary | ICD-10-CM | POA: Insufficient documentation

## 2022-04-11 DIAGNOSIS — Z1239 Encounter for other screening for malignant neoplasm of breast: Secondary | ICD-10-CM

## 2022-04-11 DIAGNOSIS — Z885 Allergy status to narcotic agent status: Secondary | ICD-10-CM | POA: Diagnosis not present

## 2022-04-11 DIAGNOSIS — C3412 Malignant neoplasm of upper lobe, left bronchus or lung: Secondary | ICD-10-CM

## 2022-04-11 DIAGNOSIS — C3431 Malignant neoplasm of lower lobe, right bronchus or lung: Secondary | ICD-10-CM

## 2022-04-11 DIAGNOSIS — S72002A Fracture of unspecified part of neck of left femur, initial encounter for closed fracture: Secondary | ICD-10-CM

## 2022-04-11 DIAGNOSIS — Z886 Allergy status to analgesic agent status: Secondary | ICD-10-CM | POA: Diagnosis not present

## 2022-04-11 LAB — CBC AND DIFFERENTIAL
HCT: 42 (ref 36–46)
Hemoglobin: 13.3 (ref 12.0–16.0)
Neutrophils Absolute: 3.5
Platelets: 282 10*3/uL (ref 150–400)
WBC: 7

## 2022-04-11 LAB — BASIC METABOLIC PANEL
BUN: 14 (ref 4–21)
CO2: 27 — AB (ref 13–22)
Chloride: 101 (ref 99–108)
Creatinine: 0.7 (ref 0.5–1.1)
Glucose: 94
Potassium: 4 mEq/L (ref 3.5–5.1)
Sodium: 136 — AB (ref 137–147)

## 2022-04-11 LAB — HEPATIC FUNCTION PANEL
ALT: 18 U/L (ref 7–35)
AST: 30 (ref 13–35)
Alkaline Phosphatase: 97 (ref 25–125)
Bilirubin, Total: 0.5

## 2022-04-11 LAB — COMPREHENSIVE METABOLIC PANEL
Albumin: 4.2 (ref 3.5–5.0)
Calcium: 9.7 (ref 8.7–10.7)

## 2022-04-11 LAB — CBC: RBC: 4.89 (ref 3.87–5.11)

## 2022-04-11 NOTE — Progress Notes (Signed)
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 Bronchoscopy with biopsy at that time revealed atypical cells. She was deemed a poor surgical candidate due to multiple comorbidities including severe oxygen dependent COPD. She completed stereotactic radiation therapy to both lesions in late February 2022 without difficulty. Follow up imaging and office visit in July of 2022 with Dr. Sondra Come revealed her to be stable with no suspicious areas and expected radiation changes. Most recent CT imaging in January revealed post treatment changes without new or progressive findings.    It has been over 6 months since her last CT and I am concerned about her weight loss, fatigue and weakness.I recommend we get a CT chest now and I will call her with the results. She has also not had a mammogram in years so I have urged her to do so and will order that as well as a bone density scan.  If all is well, she will return in 3 months with CBC, CMP and CEA. The patient understands the plans discussed today and is in agreement with them.  She knows to contact our office if she develops concerns prior to her next appointment.    Derwood Kaplan, MD  [facility] [address] [place] [address] Dept: [phone] Dept Fax: [phone]   Orders Placed This Encounter  Procedures    CBC and differential    This external order was created through the Results Console.   CBC    This external order was created through the Results Console.   Basic metabolic panel    This external order was created through the Results Console.   Comprehensive metabolic panel    This external order was created through the Results Console.   Hepatic function panel    This external order was created through the Results Console.      CHIEF COMPLAINT:  CC: A 76 year old female with history of lung cancer here for 3 month evaluation  Current Treatment:  Surveillance  INTERVAL HISTORY:  Angela Phillips is here today for repeat clinical assessment. She is not doing well and complains of severe fatigue. She has a history of hip fracture and has had several falls, including a fall off the bed a few weeks ago. She notes her appetite is poor and food tastes bitter to her. She has pain of her left elbow where she has a long screw in place, and rates that as a 7/10. I am very concerned and recommend a CT chest to re-evaluate the lung lesions since it has been over 6 months. She denies fevers or chills. She denies pain. Her weight has decreased 9 pounds over last 3 months . Her CBC, CMP and CEA are all normal.  I have reviewed the past medical history, past surgical history, social history and family history with the patient and they are unchanged from previous note.  ALLERGIES:  is allergic to aspirin, buspirone, celecoxib, chlorpheniramine, citalopram,  ezetimibe, hydrocodone-acetaminophen, prochlorperazine, statins, and venlafaxine.  MEDICATIONS:  Current Outpatient Medications  Medication Sig Dispense Refill   albuterol (PROVENTIL) (2.5 MG/3ML) 0.083% nebulizer solution Take 2.5 mg by nebulization every 6 (six) hours as needed for wheezing or shortness of breath.     albuterol (VENTOLIN HFA) 108 (90 Base) MCG/ACT inhaler Inhale 2 puffs into the lungs every 6 (six) hours as needed for wheezing or shortness of  breath.     ciprofloxacin (CIPRO) 250 MG tablet Take 250 mg by mouth 2 (two) times daily.     clonazePAM (KLONOPIN) 1 MG tablet Take 1 mg by mouth every 6 (six) hours as needed for anxiety.     FLUoxetine (PROZAC) 20 MG capsule Take 20 mg by mouth 2 (two) times daily.     gabapentin (NEURONTIN) 300 MG capsule Take 300 mg by mouth at bedtime.     levalbuterol (XOPENEX) 0.63 MG/3ML nebulizer solution SMARTSIG:1 Via Inhaler     losartan-hydrochlorothiazide (HYZAAR) 50-12.5 MG tablet Take 1 tablet by mouth daily.     meloxicam (MOBIC) 7.5 MG tablet Take 7.5 mg by mouth 2 (two) times daily.     mirtazapine (REMERON) 7.5 MG tablet Take 7.5 mg by mouth at bedtime.     montelukast (SINGULAIR) 10 MG tablet Take 1 tablet by mouth at bedtime.     Multiple Vitamin (MULTI-VITAMINS) TABS Take 1 tablet by mouth daily. Centrum silver     omeprazole (PRILOSEC) 40 MG capsule Take 40 mg by mouth daily.     ondansetron (ZOFRAN) 4 MG tablet Take 4 mg by mouth every 4 (four) hours as needed.     Probiotic Product (PROBIOTIC ADVANCED PO) Take by mouth. 60 billion     simvastatin (ZOCOR) 40 MG tablet Take 20 mg by mouth at bedtime.     SYMBICORT 160-4.5 MCG/ACT inhaler Inhale 2 puffs into the lungs 2 (two) times daily.     No current facility-administered medications for this visit.    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[place])  10/11/2020 Initial Diagnosis   Cancer of upper lobe of left lung ([place])   10/11/2020 Cancer Staging   Staging form: Lung, AJCC 8th Edition - Clinical stage from 10/11/2020: Stage IA2 (cT1b, cN0, cM0) - Signed by Derwood Kaplan, MD on 06/10/2021 Histopathologic type: Adenocarcinoma, NOS Stage prefix: Initial diagnosis Histologic grade (G): GX Histologic grading system: 4 grade system Laterality: Left Tumor size (mm): 16 Lymph-vascular invasion (LVI): Presence of LVI unknown/indeterminate Diagnostic confirmation: Radiography and other  imaging techniques without microscopic confirmation Staged by: Managing physician Stage used in treatment planning: Yes National guidelines used in treatment planning: Yes Type of national guideline used in treatment planning: NCCN Staging comments: SBRT   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[place])  10/11/2020 Initial Diagnosis   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[place])   10/11/2020 Cancer Staging   Staging form: Lung, AJCC 8th Edition - Clinical stage from 10/11/2020: Stage IA3 (cT1c, cN0, cM0) - Signed by Derwood Kaplan, MD on 06/10/2021 Stage prefix: Initial diagnosis Histologic grade (G): GX Histologic grading system: 4 grade system Laterality: Right Tumor size (mm): 22 Lymph-vascular invasion (LVI): Presence of LVI unknown/indeterminate Diagnostic confirmation: Radiography and other imaging techniques without microscopic confirmation Staged by: Managing physician Stage used in treatment planning: Yes National guidelines used in treatment planning: Yes Type of national guideline used in treatment planning: NCCN       REVIEW OF  SYSTEMS:   Constitutional: Denies fevers, chills or abnormal weight loss Eyes: Denies blurriness of vision Ears, nose, mouth, throat, and face: Denies mucositis or sore throat Respiratory: Denies cough, dyspnea or wheezes Cardiovascular: Denies palpitation, chest discomfort or lower extremity swelling Gastrointestinal:  Denies nausea, heartburn or change in bowel habits Skin: Denies abnormal skin rashes Lymphatics: Denies new lymphadenopathy or easy bruising Neurological:Denies numbness, tingling or new weaknesses Behavioral/Psych: Mood is stable, no new changes  All other systems were reviewed with the patient and are negative.   VITALS:  Blood pressure 118/87, pulse 82, temperature 98 F (36.7 C), temperature source Oral, resp. rate 17, height 5\' 1"  (1.549 m), weight 106 lb 14.4 oz (48.5 kg), SpO2 97 %.  Wt Readings from Last 3 Encounters:   04/11/22 106 lb 14.4 oz (48.5 kg)  01/09/22 115 lb 1.6 oz (52.2 kg)  09/21/21 120 lb 12.8 oz (54.8 kg)    Body mass index is 20.2 kg/m.  Performance status (ECOG): 1 - Symptomatic but completely ambulatory  PHYSICAL EXAM:   GENERAL:alert, no distress and comfortable SKIN: skin color, texture, turgor are normal, no rashes or significant lesions EYES: normal, Conjunctiva are pink and non-injected, sclera clear OROPHARYNX:no exudate, no erythema and lips, buccal mucosa, and tongue normal  NECK: supple, thyroid normal size, non-tender, without nodularity LYMPH:  no palpable lymphadenopathy in the cervical, axillary or inguinal LUNGS: clear to auscultation and percussion with normal breathing effort HEART: regular rate & rhythm and no murmurs and no lower extremity edema ABDOMEN:abdomen soft, non-tender and normal bowel sounds Musculoskeletal:no cyanosis of digits and no clubbing  NEURO: alert & oriented x 3 with fluent speech, no focal motor/sensory deficits  LABORATORY DATA:  I have reviewed the data as listed    Component Value Date/Time   NA 136 (A) 04/11/2022 0000   K 4.0 04/11/2022 0000   CL 101 04/11/2022 0000   CO2 27 (A) 04/11/2022 0000   GLUCOSE 106 (H) 08/23/2020 1022   BUN 14 04/11/2022 0000   CREATININE 0.7 04/11/2022 0000   CREATININE 0.82 08/23/2020 1022   CALCIUM 9.7 04/11/2022 0000   PROT 6.5 08/23/2020 1022   PROT 6.3 08/02/2020 1233   ALBUMIN 4.2 04/11/2022 0000   ALBUMIN 4.1 08/02/2020 1233   AST 30 04/11/2022 0000   ALT 18 04/11/2022 0000   ALKPHOS 97 04/11/2022 0000   BILITOT 0.8 08/23/2020 1022   BILITOT 0.3 08/02/2020 1233   GFRNONAA >60 08/23/2020 1022   GFRAA 93 08/02/2020 1233    No results found for: "SPEP", "UPEP"  Lab Results  Component Value Date   WBC 7.0 04/11/2022   NEUTROABS 3.50 04/11/2022   HGB 13.3 04/11/2022   HCT 42 04/11/2022   MCV 91 09/21/2021   PLT 282 04/11/2022      Chemistry      Component Value Date/Time   NA  136 (A) 04/11/2022 0000   K 4.0 04/11/2022 0000   CL 101 04/11/2022 0000   CO2 27 (A) 04/11/2022 0000   BUN 14 04/11/2022 0000   CREATININE 0.7 04/11/2022 0000   CREATININE 0.82 08/23/2020 1022   GLU 94 04/11/2022 0000      Component Value Date/Time   CALCIUM 9.7 04/11/2022 0000   ALKPHOS 97 04/11/2022 0000   AST 30 04/11/2022 0000   ALT 18 04/11/2022 0000   BILITOT 0.8 08/23/2020 1022   BILITOT 0.3 08/02/2020 1233       RADIOGRAPHIC STUDIES: I have personally reviewed the radiological images as listed  and agreed with the findings in the report.

## 2022-04-12 ENCOUNTER — Telehealth: Payer: Self-pay | Admitting: Oncology

## 2022-04-12 NOTE — Telephone Encounter (Signed)
04/12/22 LVM upcoming appts

## 2022-04-13 DIAGNOSIS — J45909 Unspecified asthma, uncomplicated: Secondary | ICD-10-CM | POA: Diagnosis not present

## 2022-04-13 LAB — CEA: CEA: 2.7 ng/mL (ref 0.0–4.7)

## 2022-04-14 DIAGNOSIS — J9610 Chronic respiratory failure, unspecified whether with hypoxia or hypercapnia: Secondary | ICD-10-CM | POA: Diagnosis not present

## 2022-04-22 DIAGNOSIS — Z20822 Contact with and (suspected) exposure to covid-19: Secondary | ICD-10-CM | POA: Diagnosis not present

## 2022-04-22 DIAGNOSIS — R5383 Other fatigue: Secondary | ICD-10-CM | POA: Diagnosis not present

## 2022-04-22 DIAGNOSIS — J069 Acute upper respiratory infection, unspecified: Secondary | ICD-10-CM | POA: Diagnosis not present

## 2022-04-22 DIAGNOSIS — J0191 Acute recurrent sinusitis, unspecified: Secondary | ICD-10-CM | POA: Diagnosis not present

## 2022-04-22 DIAGNOSIS — J029 Acute pharyngitis, unspecified: Secondary | ICD-10-CM | POA: Diagnosis not present

## 2022-04-22 DIAGNOSIS — R079 Chest pain, unspecified: Secondary | ICD-10-CM | POA: Diagnosis not present

## 2022-04-22 DIAGNOSIS — R059 Cough, unspecified: Secondary | ICD-10-CM | POA: Diagnosis not present

## 2022-04-22 DIAGNOSIS — R531 Weakness: Secondary | ICD-10-CM | POA: Diagnosis not present

## 2022-05-03 DIAGNOSIS — M5412 Radiculopathy, cervical region: Secondary | ICD-10-CM | POA: Diagnosis not present

## 2022-05-03 DIAGNOSIS — M542 Cervicalgia: Secondary | ICD-10-CM | POA: Diagnosis not present

## 2022-05-07 ENCOUNTER — Telehealth: Payer: Self-pay | Admitting: Oncology

## 2022-05-07 NOTE — Telephone Encounter (Signed)
Contacted pt to notify her of upcoming CT Chest appt. Unable to reach via phone but VM was left notifying patient of appt date, time, and instructions. Did advise her to call our office if she had any further questions.

## 2022-05-09 DIAGNOSIS — M5412 Radiculopathy, cervical region: Secondary | ICD-10-CM | POA: Diagnosis not present

## 2022-05-09 DIAGNOSIS — F330 Major depressive disorder, recurrent, mild: Secondary | ICD-10-CM | POA: Diagnosis not present

## 2022-05-09 DIAGNOSIS — R69 Illness, unspecified: Secondary | ICD-10-CM | POA: Diagnosis not present

## 2022-05-09 DIAGNOSIS — I700 Atherosclerosis of aorta: Secondary | ICD-10-CM | POA: Diagnosis not present

## 2022-05-09 DIAGNOSIS — C3431 Malignant neoplasm of lower lobe, right bronchus or lung: Secondary | ICD-10-CM | POA: Diagnosis not present

## 2022-05-09 DIAGNOSIS — M542 Cervicalgia: Secondary | ICD-10-CM | POA: Diagnosis not present

## 2022-05-09 DIAGNOSIS — M4312 Spondylolisthesis, cervical region: Secondary | ICD-10-CM | POA: Diagnosis not present

## 2022-05-09 DIAGNOSIS — E538 Deficiency of other specified B group vitamins: Secondary | ICD-10-CM | POA: Diagnosis not present

## 2022-05-09 DIAGNOSIS — R634 Abnormal weight loss: Secondary | ICD-10-CM | POA: Diagnosis not present

## 2022-05-14 ENCOUNTER — Telehealth: Payer: Self-pay

## 2022-05-14 DIAGNOSIS — J45909 Unspecified asthma, uncomplicated: Secondary | ICD-10-CM | POA: Diagnosis not present

## 2022-05-14 NOTE — Telephone Encounter (Signed)
I spoke with pt. She reports being very tired and no energy. She admits that she has no appetite, and in fact has lost 4 more pounds since she saw you. She denies any s/s infection. "To be honest, I just don't feel like living. I haven't heard anything from the CT scan I had last week. I'm not really sure of what she was looking for". I notified pt that Dr Hinton Rao doesn't work Tuesday afternoons, so she wouldn't get this message until Wednesday. She verbalized understanding.

## 2022-05-15 DIAGNOSIS — J9610 Chronic respiratory failure, unspecified whether with hypoxia or hypercapnia: Secondary | ICD-10-CM | POA: Diagnosis not present

## 2022-05-16 DIAGNOSIS — D519 Vitamin B12 deficiency anemia, unspecified: Secondary | ICD-10-CM | POA: Diagnosis not present

## 2022-05-22 DIAGNOSIS — J4550 Severe persistent asthma, uncomplicated: Secondary | ICD-10-CM | POA: Diagnosis not present

## 2022-05-23 DIAGNOSIS — R69 Illness, unspecified: Secondary | ICD-10-CM | POA: Diagnosis not present

## 2022-05-23 DIAGNOSIS — E538 Deficiency of other specified B group vitamins: Secondary | ICD-10-CM | POA: Diagnosis not present

## 2022-05-23 DIAGNOSIS — R634 Abnormal weight loss: Secondary | ICD-10-CM | POA: Diagnosis not present

## 2022-05-29 DIAGNOSIS — M5412 Radiculopathy, cervical region: Secondary | ICD-10-CM | POA: Diagnosis not present

## 2022-05-30 DIAGNOSIS — D519 Vitamin B12 deficiency anemia, unspecified: Secondary | ICD-10-CM | POA: Diagnosis not present

## 2022-06-06 DIAGNOSIS — M542 Cervicalgia: Secondary | ICD-10-CM | POA: Diagnosis not present

## 2022-06-12 DIAGNOSIS — R0981 Nasal congestion: Secondary | ICD-10-CM | POA: Diagnosis not present

## 2022-06-12 DIAGNOSIS — J0100 Acute maxillary sinusitis, unspecified: Secondary | ICD-10-CM | POA: Diagnosis not present

## 2022-06-13 DIAGNOSIS — J45909 Unspecified asthma, uncomplicated: Secondary | ICD-10-CM | POA: Diagnosis not present

## 2022-06-14 DIAGNOSIS — J9610 Chronic respiratory failure, unspecified whether with hypoxia or hypercapnia: Secondary | ICD-10-CM | POA: Diagnosis not present

## 2022-07-04 DIAGNOSIS — R634 Abnormal weight loss: Secondary | ICD-10-CM | POA: Diagnosis not present

## 2022-07-04 DIAGNOSIS — R69 Illness, unspecified: Secondary | ICD-10-CM | POA: Diagnosis not present

## 2022-07-04 DIAGNOSIS — M1990 Unspecified osteoarthritis, unspecified site: Secondary | ICD-10-CM | POA: Diagnosis not present

## 2022-07-04 DIAGNOSIS — E1165 Type 2 diabetes mellitus with hyperglycemia: Secondary | ICD-10-CM | POA: Diagnosis not present

## 2022-07-04 DIAGNOSIS — N390 Urinary tract infection, site not specified: Secondary | ICD-10-CM | POA: Diagnosis not present

## 2022-07-04 DIAGNOSIS — Z23 Encounter for immunization: Secondary | ICD-10-CM | POA: Diagnosis not present

## 2022-07-04 DIAGNOSIS — F330 Major depressive disorder, recurrent, mild: Secondary | ICD-10-CM | POA: Diagnosis not present

## 2022-07-04 DIAGNOSIS — E538 Deficiency of other specified B group vitamins: Secondary | ICD-10-CM | POA: Diagnosis not present

## 2022-07-04 DIAGNOSIS — E785 Hyperlipidemia, unspecified: Secondary | ICD-10-CM | POA: Diagnosis not present

## 2022-07-04 DIAGNOSIS — Z139 Encounter for screening, unspecified: Secondary | ICD-10-CM | POA: Diagnosis not present

## 2022-07-05 DIAGNOSIS — S42402A Unspecified fracture of lower end of left humerus, initial encounter for closed fracture: Secondary | ICD-10-CM | POA: Diagnosis not present

## 2022-07-12 ENCOUNTER — Other Ambulatory Visit: Payer: Self-pay | Admitting: Oncology

## 2022-07-12 ENCOUNTER — Inpatient Hospital Stay: Payer: Medicare HMO

## 2022-07-12 ENCOUNTER — Encounter: Payer: Self-pay | Admitting: Oncology

## 2022-07-12 ENCOUNTER — Inpatient Hospital Stay: Payer: Medicare HMO | Attending: Hematology and Oncology | Admitting: Oncology

## 2022-07-12 VITALS — BP 125/58 | HR 92 | Temp 97.4°F | Resp 18 | Ht 61.0 in | Wt 108.1 lb

## 2022-07-12 DIAGNOSIS — M810 Age-related osteoporosis without current pathological fracture: Secondary | ICD-10-CM

## 2022-07-12 DIAGNOSIS — Z79899 Other long term (current) drug therapy: Secondary | ICD-10-CM | POA: Insufficient documentation

## 2022-07-12 DIAGNOSIS — C3431 Malignant neoplasm of lower lobe, right bronchus or lung: Secondary | ICD-10-CM | POA: Insufficient documentation

## 2022-07-12 DIAGNOSIS — J449 Chronic obstructive pulmonary disease, unspecified: Secondary | ICD-10-CM | POA: Diagnosis not present

## 2022-07-12 DIAGNOSIS — Z9981 Dependence on supplemental oxygen: Secondary | ICD-10-CM | POA: Insufficient documentation

## 2022-07-12 DIAGNOSIS — Z888 Allergy status to other drugs, medicaments and biological substances status: Secondary | ICD-10-CM | POA: Insufficient documentation

## 2022-07-12 DIAGNOSIS — M129 Arthropathy, unspecified: Secondary | ICD-10-CM

## 2022-07-12 DIAGNOSIS — Z885 Allergy status to narcotic agent status: Secondary | ICD-10-CM | POA: Insufficient documentation

## 2022-07-12 DIAGNOSIS — M8589 Other specified disorders of bone density and structure, multiple sites: Secondary | ICD-10-CM | POA: Diagnosis not present

## 2022-07-12 DIAGNOSIS — C3412 Malignant neoplasm of upper lobe, left bronchus or lung: Secondary | ICD-10-CM | POA: Insufficient documentation

## 2022-07-12 DIAGNOSIS — Z1231 Encounter for screening mammogram for malignant neoplasm of breast: Secondary | ICD-10-CM | POA: Diagnosis not present

## 2022-07-12 DIAGNOSIS — Z886 Allergy status to analgesic agent status: Secondary | ICD-10-CM | POA: Insufficient documentation

## 2022-07-12 HISTORY — DX: Age-related osteoporosis without current pathological fracture: M81.0

## 2022-07-12 LAB — CBC AND DIFFERENTIAL
HCT: 41 (ref 36–46)
Hemoglobin: 13.5 (ref 12.0–16.0)
Neutrophils Absolute: 7.17
Platelets: 338 10*3/uL (ref 150–400)
WBC: 10.7

## 2022-07-12 LAB — BASIC METABOLIC PANEL
BUN: 12 (ref 4–21)
CO2: 27 — AB (ref 13–22)
Chloride: 102 (ref 99–108)
Creatinine: 0.8 (ref 0.5–1.1)
Glucose: 106
Potassium: 3.6 mEq/L (ref 3.5–5.1)
Sodium: 136 — AB (ref 137–147)

## 2022-07-12 LAB — HEPATIC FUNCTION PANEL
ALT: 24 U/L (ref 7–35)
AST: 36 — AB (ref 13–35)
Alkaline Phosphatase: 97 (ref 25–125)
Bilirubin, Total: 0.6

## 2022-07-12 LAB — COMPREHENSIVE METABOLIC PANEL
Albumin: 4.1 (ref 3.5–5.0)
Calcium: 9.7 (ref 8.7–10.7)

## 2022-07-12 LAB — CBC: RBC: 4.88 (ref 3.87–5.11)

## 2022-07-12 NOTE — Progress Notes (Signed)
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 Bronchoscopy with biopsy at that time revealed atypical cells. She was deemed a poor surgical candidate due to multiple comorbidities including severe oxygen dependent COPD. She completed stereotactic radiation therapy to both lesions in late February 2022. Follow up imaging and office visit in July of 2022 with Dr. Sondra Come revealed her to be stable with no suspicious areas and expected radiation changes. CT in January of 2023 revealed post treatment changes with no new or progressive findings. In August she had weight loss, fatigue and weakness, and CT 05/09/22 revealed continued evolution of post-treatment changes in the right lower lobe and left upper lobe.    She is stable in terms of her 2 small lung cancers. She has osteoporosis that is untreated so I will arrange for Prolia injections every 6 months. I encouraged her to continue her B12 injections since she is feeling better.  She will let me know if the headaches persist and I would do a scan of her brain. I will check an RA and ANA and let her know about her mammogram result. She will return in 3 months with CBC, CMP, CEA and CT chest. She understands and agrees with this plan of care and knows to call if she has further concerns regarding her lung cancer.    Derwood Kaplan, MD  [facility] [address] [place] [address] Dept: [phone] Dept Fax: [phone]   No orders of the defined types were placed in this encounter.     CHIEF COMPLAINT:  CC: A 76 year old female with history of lung cancer here for 3 month evaluation  Current Treatment:  Surveillance  INTERVAL HISTORY:  Steve is here today for repeat clinical assessment. She has been placed on B12 and feels better, with appetitie improved.  She has a history of hip fracture and compression fracture, and now has had a fracture of the left elbow. A bone density was done and shows osteoporosis of the right femur and right forearm. She has severe osteoarthritis and degenerative disk disease. I will check an ANA and RA.  She has severe reflux symptoms so I don't think she would tolerate oral bisphosphonate, so I have recommended Prolia injections and reviewed the risks and benefits. She agrees and I will work on getting that scheduled. She had her annual mammogram today but we do not have a report. Her CBC and CMP look good today. Her main complaint today is headaches, which she rates as 8/10. She denies fevers or chills. She denies pain. Her weight has increased 2 pounds since her last visit 3 months ago. Her CBC, CMP and CEA are all normal.  I have reviewed the past medical history, past surgical history, social history and family history with the patient and they are unchanged from previous note.  ALLERGIES:  is allergic to aspirin, buspirone, celecoxib, chlorpheniramine, citalopram, ezetimibe, hydrocodone-acetaminophen, prochlorperazine, statins, and venlafaxine.  MEDICATIONS:  Current Outpatient Medications  Medication Sig Dispense Refill   celecoxib (CELEBREX) 200 MG capsule Take 200 mg by mouth daily.     diclofenac (VOLTAREN) 75 MG EC tablet Take 75 mg by mouth 2 (two) times daily.     methocarbamol (ROBAXIN) 500 MG tablet Take 500-1,000 mg by mouth every 6 (six) hours as  needed.     albuterol (PROVENTIL) (2.5 MG/3ML) 0.083% nebulizer solution Take 2.5 mg by nebulization every 6 (six) hours as needed for wheezing or shortness of breath.     albuterol (VENTOLIN HFA) 108 (90 Base) MCG/ACT inhaler Inhale 2 puffs into the lungs every 6 (six) hours as needed for wheezing or shortness of breath.     clonazePAM (KLONOPIN) 1 MG tablet Take 1 mg by mouth every 6 (six) hours as needed for anxiety.     FLUoxetine (PROZAC) 20 MG capsule Take 20 mg by mouth 2 (two) times daily.     gabapentin (NEURONTIN) 300 MG capsule Take 300 mg by mouth at bedtime.     levalbuterol (XOPENEX) 0.63 MG/3ML nebulizer solution SMARTSIG:1 Via Inhaler     losartan-hydrochlorothiazide (HYZAAR) 50-12.5 MG tablet Take 1 tablet by mouth daily.     meloxicam (MOBIC) 7.5 MG tablet Take 7.5 mg by mouth 2 (two) times daily.     mirtazapine (REMERON) 7.5 MG tablet Take 7.5 mg by mouth at bedtime.     montelukast (SINGULAIR) 10 MG tablet Take 1 tablet by mouth at bedtime.     Multiple Vitamin (MULTI-VITAMINS) TABS Take 1 tablet by mouth daily. Centrum silver     omeprazole (PRILOSEC) 40 MG capsule Take 40 mg by mouth daily.     ondansetron (ZOFRAN) 4 MG tablet Take 4 mg by mouth every 4 (four) hours as needed.     Probiotic Product (PROBIOTIC ADVANCED PO) Take by mouth. 60 billion     simvastatin (ZOCOR) 40 MG tablet Take 20 mg by mouth at bedtime.     SYMBICORT 160-4.5 MCG/ACT inhaler Inhale 2 puffs into the lungs 2 (two) times daily.     No current facility-administered medications for this visit.    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[place])   10/11/2020 Cancer Staging   Staging form: Lung, AJCC 8th Edition - Clinical stage from 10/11/2020: Stage IA2 (cT1b, cN0, cM0) - Signed by Derwood Kaplan, MD on 06/10/2021 Histopathologic type: Adenocarcinoma, NOS Stage prefix: Initial  diagnosis Histologic grade (G): GX Histologic grading system: 4 grade system Laterality: Left Tumor size (mm): 16 Lymph-vascular invasion (LVI): Presence of LVI unknown/indeterminate Diagnostic confirmation: Radiography and other imaging techniques without microscopic confirmation Staged by: Managing physician Stage used in treatment planning: Yes National guidelines used in treatment planning: Yes Type of national guideline used in treatment planning: NCCN Staging comments: SBRT   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[place])  10/11/2020 Initial Diagnosis   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[place])   10/11/2020 Cancer Staging   Staging form: Lung, AJCC 8th Edition - Clinical stage from 10/11/2020: Stage IA3 (cT1c, cN0, cM0) - Signed by Derwood Kaplan, MD on 06/10/2021 Stage prefix: Initial diagnosis Histologic grade (G): GX Histologic grading system: 4 grade system Laterality: Right Tumor size (mm): 22 Lymph-vascular invasion (LVI): Presence of LVI unknown/indeterminate Diagnostic confirmation: Radiography and other imaging techniques without microscopic confirmation Staged by: Managing physician Stage  used in treatment planning: Yes National guidelines used in treatment planning: Yes Type of national guideline used in treatment planning: NCCN       REVIEW OF SYSTEMS:   Constitutional: Denies fevers, chills or abnormal weight loss Eyes: Denies blurriness of vision Ears, nose, mouth, throat, and face: Denies mucositis or sore throat Respiratory: Denies cough, dyspnea or wheezes Cardiovascular: Denies palpitation, chest discomfort or lower extremity swelling Gastrointestinal:  Denies nausea, heartburn or change in bowel habits Skin: Denies abnormal skin rashes Lymphatics: Denies new lymphadenopathy or easy bruising Neurological:Denies numbness, tingling or new weaknesses Behavioral/Psych: Mood is stable, no new changes  All other systems were reviewed with the patient and are  negative.   VITALS:  Blood pressure (!) 125/58, pulse 92, temperature (!) 97.4 F (36.3 C), temperature source Oral, resp. rate 18, height 5\' 1"  (1.549 m), weight 108 lb 1.6 oz (49 kg), SpO2 95 %.  Wt Readings from Last 3 Encounters:  07/12/22 108 lb 1.6 oz (49 kg)  04/11/22 106 lb 14.4 oz (48.5 kg)  01/09/22 115 lb 1.6 oz (52.2 kg)    Body mass index is 20.43 kg/m.  Performance status (ECOG): 1 - Symptomatic but completely ambulatory  PHYSICAL EXAM:   GENERAL:alert, no distress and comfortable SKIN: skin color, texture, turgor are normal, no rashes or significant lesions EYES: normal, Conjunctiva are pink and non-injected, sclera clear OROPHARYNX:no exudate, no erythema and lips, buccal mucosa, and tongue normal  NECK: supple, thyroid normal size, non-tender, without nodularity LYMPH:  no palpable lymphadenopathy in the cervical, axillary or inguinal LUNGS: clear to auscultation and percussion with normal breathing effort HEART: regular rate & rhythm and no murmurs and no lower extremity edema ABDOMEN:abdomen soft, non-tender and normal bowel sounds Musculoskeletal:no cyanosis of digits and no clubbing  NEURO: alert & oriented x 3 with fluent speech, no focal motor/sensory deficits  LABORATORY DATA:  I have reviewed the data as listed    Component Value Date/Time   NA 136 (A) 07/12/2022 0000   K 3.6 07/12/2022 0000   CL 102 07/12/2022 0000   CO2 27 (A) 07/12/2022 0000   GLUCOSE 106 (H) 08/23/2020 1022   BUN 12 07/12/2022 0000   CREATININE 0.8 07/12/2022 0000   CREATININE 0.82 08/23/2020 1022   CALCIUM 9.7 07/12/2022 0000   PROT 6.5 08/23/2020 1022   PROT 6.3 08/02/2020 1233   ALBUMIN 4.1 07/12/2022 0000   ALBUMIN 4.1 08/02/2020 1233   AST 36 (A) 07/12/2022 0000   ALT 24 07/12/2022 0000   ALKPHOS 97 07/12/2022 0000   BILITOT 0.8 08/23/2020 1022   BILITOT 0.3 08/02/2020 1233   GFRNONAA >60 08/23/2020 1022   GFRAA 93 08/02/2020 1233    No results found for:  "SPEP", "UPEP"  Lab Results  Component Value Date   WBC 10.7 07/12/2022   NEUTROABS 7.17 07/12/2022   HGB 13.5 07/12/2022   HCT 41 07/12/2022   MCV 91 09/21/2021   PLT 338 07/12/2022      Chemistry      Component Value Date/Time   NA 136 (A) 07/12/2022 0000   K 3.6 07/12/2022 0000   CL 102 07/12/2022 0000   CO2 27 (A) 07/12/2022 0000   BUN 12 07/12/2022 0000   CREATININE 0.8 07/12/2022 0000   CREATININE 0.82 08/23/2020 1022   GLU 106 07/12/2022 0000      Component Value Date/Time   CALCIUM 9.7 07/12/2022 0000   ALKPHOS 97 07/12/2022 0000   AST 36 (A) 07/12/2022 0000  ALT 24 07/12/2022 0000   BILITOT 0.8 08/23/2020 1022   BILITOT 0.3 08/02/2020 1233       RADIOGRAPHIC STUDIES: I have personally reviewed the radiological images as listed and agreed with the findings in the report.

## 2022-07-13 LAB — CEA: CEA: 3 ng/mL (ref 0.0–4.7)

## 2022-07-14 DIAGNOSIS — J45909 Unspecified asthma, uncomplicated: Secondary | ICD-10-CM | POA: Diagnosis not present

## 2022-07-15 ENCOUNTER — Telehealth: Payer: Self-pay

## 2022-07-15 ENCOUNTER — Other Ambulatory Visit: Payer: Self-pay | Admitting: Pharmacist

## 2022-07-15 DIAGNOSIS — R45851 Suicidal ideations: Secondary | ICD-10-CM

## 2022-07-15 DIAGNOSIS — M810 Age-related osteoporosis without current pathological fracture: Secondary | ICD-10-CM | POA: Diagnosis not present

## 2022-07-15 DIAGNOSIS — Z888 Allergy status to other drugs, medicaments and biological substances status: Secondary | ICD-10-CM | POA: Diagnosis not present

## 2022-07-15 DIAGNOSIS — Z79899 Other long term (current) drug therapy: Secondary | ICD-10-CM | POA: Diagnosis not present

## 2022-07-15 DIAGNOSIS — C3412 Malignant neoplasm of upper lobe, left bronchus or lung: Secondary | ICD-10-CM | POA: Diagnosis not present

## 2022-07-15 DIAGNOSIS — J449 Chronic obstructive pulmonary disease, unspecified: Secondary | ICD-10-CM | POA: Diagnosis not present

## 2022-07-15 DIAGNOSIS — C3431 Malignant neoplasm of lower lobe, right bronchus or lung: Secondary | ICD-10-CM | POA: Diagnosis not present

## 2022-07-15 DIAGNOSIS — Z9981 Dependence on supplemental oxygen: Secondary | ICD-10-CM | POA: Diagnosis not present

## 2022-07-15 DIAGNOSIS — Z886 Allergy status to analgesic agent status: Secondary | ICD-10-CM | POA: Diagnosis not present

## 2022-07-15 DIAGNOSIS — Z885 Allergy status to narcotic agent status: Secondary | ICD-10-CM | POA: Diagnosis not present

## 2022-07-15 NOTE — Telephone Encounter (Signed)
Mammogram has not been read yet

## 2022-07-15 NOTE — Telephone Encounter (Signed)
-----   Message from Derwood Kaplan, MD sent at 07/15/2022  9:45 AM EST ----- Regarding: mammo Did she get her mammo this year?

## 2022-07-16 ENCOUNTER — Encounter: Payer: Self-pay | Admitting: Oncology

## 2022-07-16 LAB — RHEUMATOID FACTOR: Rheumatoid fact SerPl-aCnc: 11.1 IU/mL (ref <14.0)

## 2022-07-16 NOTE — Telephone Encounter (Signed)
-----   Message from Neva Seat sent at 07/16/2022  8:46 AM EST ----- Regarding: Mammogram results Patient calling about mammogram results.

## 2022-07-16 NOTE — Telephone Encounter (Signed)
Patient notified of mammo results

## 2022-07-17 DIAGNOSIS — J4550 Severe persistent asthma, uncomplicated: Secondary | ICD-10-CM | POA: Diagnosis not present

## 2022-07-17 LAB — ANTINUCLEAR ANTIBODIES, IFA: ANA Ab, IFA: NEGATIVE

## 2022-07-19 ENCOUNTER — Encounter: Payer: Self-pay | Admitting: Oncology

## 2022-07-22 ENCOUNTER — Inpatient Hospital Stay: Payer: Medicare HMO

## 2022-07-25 ENCOUNTER — Inpatient Hospital Stay: Payer: Medicare HMO

## 2022-08-03 ENCOUNTER — Encounter: Payer: Self-pay | Admitting: Oncology

## 2022-08-05 ENCOUNTER — Encounter: Payer: Self-pay | Admitting: Oncology

## 2022-08-05 NOTE — Addendum Note (Signed)
Addended by: Juanetta Beets on: 08/05/2022 02:27 PM   Modules accepted: Orders

## 2022-08-06 ENCOUNTER — Inpatient Hospital Stay: Payer: Medicare HMO

## 2022-08-06 VITALS — BP 154/62 | HR 75 | Temp 97.4°F | Resp 18 | Wt 107.6 lb

## 2022-08-06 DIAGNOSIS — C3412 Malignant neoplasm of upper lobe, left bronchus or lung: Secondary | ICD-10-CM | POA: Diagnosis not present

## 2022-08-06 DIAGNOSIS — J449 Chronic obstructive pulmonary disease, unspecified: Secondary | ICD-10-CM | POA: Diagnosis not present

## 2022-08-06 DIAGNOSIS — Z9981 Dependence on supplemental oxygen: Secondary | ICD-10-CM | POA: Diagnosis not present

## 2022-08-06 DIAGNOSIS — M810 Age-related osteoporosis without current pathological fracture: Secondary | ICD-10-CM | POA: Diagnosis not present

## 2022-08-06 DIAGNOSIS — Z886 Allergy status to analgesic agent status: Secondary | ICD-10-CM | POA: Diagnosis not present

## 2022-08-06 DIAGNOSIS — Z885 Allergy status to narcotic agent status: Secondary | ICD-10-CM | POA: Diagnosis not present

## 2022-08-06 DIAGNOSIS — Z888 Allergy status to other drugs, medicaments and biological substances status: Secondary | ICD-10-CM | POA: Diagnosis not present

## 2022-08-06 DIAGNOSIS — Z79899 Other long term (current) drug therapy: Secondary | ICD-10-CM | POA: Diagnosis not present

## 2022-08-06 DIAGNOSIS — C3431 Malignant neoplasm of lower lobe, right bronchus or lung: Secondary | ICD-10-CM | POA: Diagnosis not present

## 2022-08-06 MED ORDER — DENOSUMAB 60 MG/ML ~~LOC~~ SOSY
60.0000 mg | PREFILLED_SYRINGE | Freq: Once | SUBCUTANEOUS | Status: AC
  Administered 2022-08-06: 60 mg via SUBCUTANEOUS
  Filled 2022-08-06: qty 1

## 2022-08-06 NOTE — Progress Notes (Signed)
PT INSTRUCTED TO BEGIN CALCIUM  1200 MG DAILY SUPPLEMENT IMMEDIATELY. PT IS AGREEABLE TO THAT PLAN AND STATES SHE WILL BUY SOME TODAY.  SUGGESTED CITRICAL 1200 MG SLOW RELEASE TABLETS.

## 2022-08-06 NOTE — Patient Instructions (Signed)
Denosumab Injection (Osteoporosis) What is this medication? DENOSUMAB (den oh SUE mab) prevents and treats osteoporosis. It works by making your bones stronger and less likely to break (fracture). It is a monoclonal antibody. This medicine may be used for other purposes; ask your health care provider or pharmacist if you have questions. COMMON BRAND NAME(S): Prolia What should I tell my care team before I take this medication? They need to know if you have any of these conditions: Dental or gum disease, or plan to have dental surgery or a tooth pulled Infection Kidney disease Low levels of calcium or vitamin D in your blood On dialysis Poor nutrition Skin conditions Thyroid disease, or have had thyroid or parathyroid surgery Trouble absorbing minerals in your stomach or intestine An unusual or allergic reaction to denosumab, other medications, foods, dyes, or preservatives Pregnant or trying to get pregnant Breastfeeding How should I use this medication? This medication is injected under the skin. It is given by your care team in a hospital or clinic setting. A special MedGuide will be given to you before each treatment. Be sure to read this information carefully each time. Talk to your care team about the use of this medication in children. Special care may be needed. Overdosage: If you think you have taken too much of this medicine contact a poison control center or emergency room at once. NOTE: This medicine is only for you. Do not share this medicine with others. What if I miss a dose? Keep appointments for follow-up doses. It is important not to miss your dose. Call your care team if you are unable to keep an appointment. What may interact with this medication? Do not take this medication with any of the following: Other medications that contain denosumab This medication may also interact with the following: Medications that lower your chance of fighting infection Steroid  medications, such as prednisone or cortisone This list may not describe all possible interactions. Give your health care provider a list of all the medicines, herbs, non-prescription drugs, or dietary supplements you use. Also tell them if you smoke, drink alcohol, or use illegal drugs. Some items may interact with your medicine. What should I watch for while using this medication? Your condition will be monitored carefully while you are receiving this medication. You may need blood work while taking this medication. This medication may increase your risk of getting an infection. Call your care team for advice if you get a fever, chills, sore throat, or other symptoms of a cold or flu. Do not treat yourself. Try to avoid being around people who are sick. Tell your dentist and dental surgeon that you are taking this medication. You should not have major dental surgery while on this medication. See your dentist to have a dental exam and fix any dental problems before starting this medication. Take good care of your teeth while on this medication. Make sure you see your dentist for regular follow-up appointments. You should make sure you get enough calcium and vitamin D while you are taking this medication. Discuss the foods you eat and the vitamins you take with your care team. Talk to your care team if you are pregnant or think you might be pregnant. This medication can cause serious birth defects if taken during pregnancy and for 5 months after the last dose. You will need a negative pregnancy test before starting this medication. Contraception is recommended while taking this medication and for 5 months after the last dose. Your care   team can help you find the option that works for you. Talk to your care team before breastfeeding. Changes to your treatment plan may be needed. What side effects may I notice from receiving this medication? Side effects that you should report to your care team as soon as  possible: Allergic reactions--skin rash, itching, hives, swelling of the face, lips, tongue, or throat Infection--fever, chills, cough, sore throat, wounds that don't heal, pain or trouble when passing urine, general feeling of discomfort or being unwell Low calcium level--muscle pain or cramps, confusion, tingling, or numbness in the hands or feet Osteonecrosis of the jaw--pain, swelling, or redness in the mouth, numbness of the jaw, poor healing after dental work, unusual discharge from the mouth, visible bones in the mouth Severe bone, joint, or muscle pain Skin infection--skin redness, swelling, warmth, or pain Side effects that usually do not require medical attention (report these to your care team if they continue or are bothersome): Back pain Headache Joint pain Muscle pain Pain in the hands, arms, legs, or feet Runny or stuffy nose Sore throat This list may not describe all possible side effects. Call your doctor for medical advice about side effects. You may report side effects to FDA at 1-800-FDA-1088. Where should I keep my medication? This medication is given in a hospital or clinic. It will not be stored at home. NOTE: This sheet is a summary. It may not cover all possible information. If you have questions about this medicine, talk to your doctor, pharmacist, or health care provider.  2023 Elsevier/Gold Standard (2022-01-07 00:00:00)  

## 2022-08-07 ENCOUNTER — Telehealth: Payer: Self-pay

## 2022-08-07 ENCOUNTER — Encounter: Payer: Self-pay | Admitting: Licensed Clinical Social Worker

## 2022-08-07 DIAGNOSIS — E538 Deficiency of other specified B group vitamins: Secondary | ICD-10-CM | POA: Diagnosis not present

## 2022-08-07 DIAGNOSIS — N390 Urinary tract infection, site not specified: Secondary | ICD-10-CM | POA: Diagnosis not present

## 2022-08-07 DIAGNOSIS — C3431 Malignant neoplasm of lower lobe, right bronchus or lung: Secondary | ICD-10-CM

## 2022-08-07 DIAGNOSIS — M1990 Unspecified osteoarthritis, unspecified site: Secondary | ICD-10-CM | POA: Diagnosis not present

## 2022-08-07 NOTE — Progress Notes (Signed)
[place] Work  Clinical Social Work was referred by medical provider for assessment of psychosocial needs.  Clinical Social Worker attempted to contact patient by phone  to offer support and assess for needs.  CSW left voicemail with contact information and request for return call.     Adelene Amas, [place] Worker [facility]

## 2022-08-07 NOTE — Telephone Encounter (Signed)
-----   Message from Derwood Kaplan, MD sent at 08/03/2022  3:17 PM EST ----- Regarding: mammo When mammo?

## 2022-08-07 NOTE — Telephone Encounter (Signed)
Mammo and Bone Density printed and placed in Dr. Remi Deter box for her review

## 2022-08-09 ENCOUNTER — Encounter: Payer: Self-pay | Admitting: Oncology

## 2022-08-13 DIAGNOSIS — J45909 Unspecified asthma, uncomplicated: Secondary | ICD-10-CM | POA: Diagnosis not present

## 2022-08-20 ENCOUNTER — Inpatient Hospital Stay: Payer: Medicare HMO | Attending: Hematology and Oncology | Admitting: Licensed Clinical Social Worker

## 2022-08-20 DIAGNOSIS — C3412 Malignant neoplasm of upper lobe, left bronchus or lung: Secondary | ICD-10-CM

## 2022-08-20 NOTE — Progress Notes (Signed)
Angela Phillips  Initial Assessment   Angela Phillips is a 76 y.o. year old female contacted by phone. Clinical Social Phillips was referred by medical provider for assessment of psychosocial needs.   SDOH (Social Determinants of Health) assessments performed: Yes SDOH Interventions    Flowsheet Row Clinical Support from 08/20/2022 in [facility] at [place] Interventions Intervention Not Indicated  Housing Interventions Intervention Not Indicated  Transportation Interventions Patient Resources (Friends/Family)  Utilities Interventions Intervention Not Indicated  Alcohol Usage Interventions Intervention Not Indicated (Score <7)  Financial Strain Interventions Intervention Not Indicated  Physical Activity Interventions Intervention Not Indicated  Stress Interventions Intervention Not Indicated  Social Connections Interventions Intervention Not Indicated       SDOH Screenings   Food Insecurity: No Food Insecurity (08/20/2022)  Housing: Low Risk  (08/20/2022)  Transportation Needs: No Transportation Needs (08/20/2022)  Utilities: Not At Risk (08/20/2022)  Alcohol Screen: Low Risk  (08/20/2022)  Depression (PHQ2-9): Low Risk  (08/20/2022)  Financial Resource Strain: Low Risk  (08/20/2022)  Physical Activity: Inactive (08/20/2022)  Social Connections: Moderately Isolated (08/20/2022)  Stress: Stress Concern Present (08/20/2022)  Tobacco Use: Medium Risk (07/12/2022)     Distress Screen completed: Yes    10/02/2020   12:46 PM  ONCBCN DISTRESS SCREENING  Screening Type Initial Screening  Distress experienced in past week (1-10) 2  Practical problem type Insurance  Emotional problem type Depression;Nervousness/Anxiety  Physical Problem type Loss of appetitie;Constipation/diarrhea;Tingling hands/feet  Physician notified of physical symptoms Yes  Referral to clinical psychology No  Referral to clinical social Phillips No   Referral to financial advocate No  Referral to support programs No  Referral to palliative care No      Family/Social Information:  Housing Arrangement: patient lives with spouse  Angela Phillips [phone]  Family members/support persons in your life? Family, Friends, and Geophysical data processor concerns: no  Employment: Retired  .  Income source: Paediatric nurse concerns: No Type of concern: None Food access concerns: no Religious or spiritual practice: Not known Services Currently in place:  [employer]  Coping/ Adjustment to diagnosis: Patient understands treatment plan and what happens next? yes Concerns about diagnosis and/or treatment: I'm not especially worried about anything Patient reported stressors: Depression and Anxiety/ nervousness Hopes and/or priorities: Happy not be in radiation anymore Patient enjoys  N/A Current coping skills/ strengths: Active sense of humor , Average or above average intelligence , Capable of independent living , Communication skills , Scientist, research (life sciences) , Motivation for treatment/growth , Physical Health , Supportive family/friends , and Phillips skills     SUMMARY: Current SDOH Barriers:  Limited social support and [place] Phillips Clinical Goal(s):  No clinical social Phillips goals at this time  Interventions: Discussed common feeling and emotions when being diagnosed with cancer, and the importance of support during treatment Informed patient of the support team roles and support services at [facility] Provided [place] contact information and encouraged patient to call with any questions or concerns Provided patient with information about CSW role in patient care. Patient stated she is done with radiation and is no longer in active treatment.  No immediate needs identified.   Follow Up Plan: Patient will contact CSW with any support or resource needs Patient verbalizes understanding of  plan: Yes    Onur Mori, LCSW

## 2022-08-28 DIAGNOSIS — M65321 Trigger finger, right index finger: Secondary | ICD-10-CM | POA: Diagnosis not present

## 2022-08-28 DIAGNOSIS — M65331 Trigger finger, right middle finger: Secondary | ICD-10-CM | POA: Diagnosis not present

## 2022-08-29 DIAGNOSIS — R69 Illness, unspecified: Secondary | ICD-10-CM | POA: Diagnosis not present

## 2022-08-29 DIAGNOSIS — K529 Noninfective gastroenteritis and colitis, unspecified: Secondary | ICD-10-CM | POA: Diagnosis not present

## 2022-08-29 DIAGNOSIS — R159 Full incontinence of feces: Secondary | ICD-10-CM | POA: Diagnosis not present

## 2022-09-13 DIAGNOSIS — J45909 Unspecified asthma, uncomplicated: Secondary | ICD-10-CM | POA: Diagnosis not present

## 2022-09-16 DIAGNOSIS — D519 Vitamin B12 deficiency anemia, unspecified: Secondary | ICD-10-CM | POA: Diagnosis not present

## 2022-09-18 DIAGNOSIS — J4550 Severe persistent asthma, uncomplicated: Secondary | ICD-10-CM | POA: Diagnosis not present

## 2022-10-07 DIAGNOSIS — R5383 Other fatigue: Secondary | ICD-10-CM | POA: Diagnosis not present

## 2022-10-07 DIAGNOSIS — R918 Other nonspecific abnormal finding of lung field: Secondary | ICD-10-CM | POA: Diagnosis not present

## 2022-10-07 DIAGNOSIS — J4550 Severe persistent asthma, uncomplicated: Secondary | ICD-10-CM | POA: Diagnosis not present

## 2022-10-10 ENCOUNTER — Telehealth: Payer: Self-pay | Admitting: Oncology

## 2022-10-10 NOTE — Telephone Encounter (Signed)
Contacted pt to to notify her of CT scan appt. Unable to reach via phone, voicemail was left requesting she give me a call back.   CT scheduled for 10/29/22; Checking in @ 1pm at [facility]

## 2022-10-14 DIAGNOSIS — J45909 Unspecified asthma, uncomplicated: Secondary | ICD-10-CM | POA: Diagnosis not present

## 2022-10-15 DIAGNOSIS — M65351 Trigger finger, right little finger: Secondary | ICD-10-CM | POA: Diagnosis not present

## 2022-10-15 DIAGNOSIS — R079 Chest pain, unspecified: Secondary | ICD-10-CM | POA: Diagnosis not present

## 2022-10-15 DIAGNOSIS — R0789 Other chest pain: Secondary | ICD-10-CM | POA: Diagnosis not present

## 2022-10-15 DIAGNOSIS — S39011A Strain of muscle, fascia and tendon of abdomen, initial encounter: Secondary | ICD-10-CM | POA: Diagnosis not present

## 2022-10-15 DIAGNOSIS — M65321 Trigger finger, right index finger: Secondary | ICD-10-CM | POA: Diagnosis not present

## 2022-10-15 DIAGNOSIS — M65341 Trigger finger, right ring finger: Secondary | ICD-10-CM | POA: Diagnosis not present

## 2022-10-15 DIAGNOSIS — M65331 Trigger finger, right middle finger: Secondary | ICD-10-CM | POA: Diagnosis not present

## 2022-10-28 DIAGNOSIS — E785 Hyperlipidemia, unspecified: Secondary | ICD-10-CM | POA: Diagnosis not present

## 2022-10-28 DIAGNOSIS — Z853 Personal history of malignant neoplasm of breast: Secondary | ICD-10-CM | POA: Diagnosis not present

## 2022-10-28 DIAGNOSIS — I10 Essential (primary) hypertension: Secondary | ICD-10-CM | POA: Diagnosis not present

## 2022-10-28 DIAGNOSIS — M2550 Pain in unspecified joint: Secondary | ICD-10-CM | POA: Diagnosis not present

## 2022-10-28 DIAGNOSIS — M1990 Unspecified osteoarthritis, unspecified site: Secondary | ICD-10-CM | POA: Diagnosis not present

## 2022-10-28 DIAGNOSIS — M79643 Pain in unspecified hand: Secondary | ICD-10-CM | POA: Diagnosis not present

## 2022-10-29 DIAGNOSIS — I2510 Atherosclerotic heart disease of native coronary artery without angina pectoris: Secondary | ICD-10-CM | POA: Diagnosis not present

## 2022-10-29 DIAGNOSIS — C3490 Malignant neoplasm of unspecified part of unspecified bronchus or lung: Secondary | ICD-10-CM | POA: Diagnosis not present

## 2022-10-29 DIAGNOSIS — I700 Atherosclerosis of aorta: Secondary | ICD-10-CM | POA: Diagnosis not present

## 2022-10-29 DIAGNOSIS — C3431 Malignant neoplasm of lower lobe, right bronchus or lung: Secondary | ICD-10-CM | POA: Diagnosis not present

## 2022-10-29 LAB — BASIC METABOLIC PANEL
BUN: 15 (ref 4–21)
CO2: 26 — AB (ref 13–22)
Chloride: 101 (ref 99–108)
Creatinine: 0.7 (ref 0.5–1.1)
Glucose: 86
Potassium: 3.7 mEq/L (ref 3.5–5.1)
Sodium: 137 (ref 137–147)

## 2022-10-29 LAB — HEPATIC FUNCTION PANEL
ALT: 18 U/L (ref 7–35)
AST: 28 (ref 13–35)
Alkaline Phosphatase: 61 (ref 25–125)
Bilirubin, Total: 0.5

## 2022-10-29 LAB — CBC AND DIFFERENTIAL
HCT: 41 (ref 36–46)
Hemoglobin: 13.5 (ref 12.0–16.0)
Neutrophils Absolute: 4.9
Platelets: 292 10*3/uL (ref 150–400)
WBC: 8.3

## 2022-10-29 LAB — COMPREHENSIVE METABOLIC PANEL
Albumin: 4.3 (ref 3.5–5.0)
Calcium: 9 (ref 8.7–10.7)

## 2022-10-29 LAB — CBC: RBC: 4.75 (ref 3.87–5.11)

## 2022-10-30 NOTE — Progress Notes (Signed)
Patient Care Team: Lowella Dandy, NP as PCP - General (Internal Medicine) Melrose Nakayama, MD as Consulting Physician (Cardiothoracic Surgery) Gery Pray, MD as Consulting Physician (Radiation Oncology) Gardiner Rhyme, MD as Referring Physician (Pulmonary Disease) Derwood Kaplan, MD as Consulting Physician (Oncology)  Clinic Day: 11/01/22   I connected with Angela Phillips. Angela Phillips on 11/01/22 at  4:00 PM EST by telephone visit and verified that I am speaking with the correct person using two identifiers.  I discussed the limitations, risks, security and privacy concerns of performing an evaluation and management service by telemedicine and the availability of in-person appointments. I also discussed with the patient that there may be a patient responsible charge related to this service. The patient expressed understanding and agreed to proceed.  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 Bronchoscopy with biopsy at that time revealed atypical cells. She was deemed a poor surgical candidate due to multiple comorbidities including severe oxygen dependent COPD. She completed stereotactic radiation therapy to both lesions in late February 2022. Follow up imaging and office visit in July of 2022 with Dr. Sondra Come revealed her to be stable with no suspicious areas and expected radiation changes. CT in January of 2023 revealed post treatment changes with no new or progressive findings. In August she had weight loss, fatigue and weakness, and CT 05/09/22 revealed continued  evolution of post-treatment changes in the right lower lobe and left upper lobe.  She has now had a new CT scan done on October 29, 2022 which reveals postradiation changes but no evidence of recurrent or metastatic disease.  She has a small pleural effusion.  She has a new mild compression of the T8 vertebral body.  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  She was started on Prolia injections on August 06, 2022 and tolerated this without any difficulty.  She is on calcium supplement 1200 mg daily and her calcium level is normal.  Plan She is stable in terms of her 2 small lung cancers. She has osteoporosis that was untreated so I arranged for Prolia injections every 6 months. I encouraged her to continue her B12 injections since she is feeling better.  She also knows to continue calcium supplement of 1200 mg daily.  Her annual screening mammogram was clear in November 2023. She will return in 3 months with CBC, CMP, CEA and we will plan her next Prolia injection at that time.  If all is well, we will wait 6 months to repeat her CT scan of the chest.  She understands and agrees with this plan of care and knows to call if she has further concerns regarding her lung cancers.  I provided 10 minutes of time during this this encounter and > 50% was spent counseling as documented under my assessment and plan.    [facility] [address] [place] [address]  Dept: [phone] Dept Fax: [phone]   No orders of the defined types were placed in this encounter.     CHIEF COMPLAINT:  CC: A 77 year old female with bilateral non-small cell lung cancers  Current Treatment:  Surveillance  INTERVAL HISTORY:  Angela Phillips is here today for repeat clinical assessment for her bilateral non-small cell lung cancers.   These were treated with stereotactic radiation successfully.  Her latest CT scan from October 29, 2022 reveals postradiation changes but no evidence of recurrent or metastatic disease.  She has a small pleural effusion.  She also has a new compression deformity at the T8 vertebral body.  She has known osteoporosis with a bone density scan done July 12, 2022.  The T-score of the femur was -3.0 and the T-score of the forearm was even worse.  She received her first injection of Prolia at the end of November and tolerated it without any difficulty.  She is on calcium supplement 1200 mg daily and her calcium level is normal.  She tells me she had surgery on her right hand and just had therapy so her appetite is poor.  She had her testing done but did not feel up to coming in for her appointment and so we changed this to a telephone visit. She has a history of hip fracture and compression fracture, and then a fracture of the left elbow.  She had her annual mammogram July 12, 2022 and this was clear.  Her CBC, CMP and CEA are all normal. She denies signs of infection such as sore throat, sinus drainage, cough, or urinary symptoms.  She denies fevers or recurrent chills. She denies pain. She denies nausea, vomiting, chest pain, dyspnea or cough  I have reviewed the past medical history, past surgical history, social history and family history with the patient and they are unchanged from previous note.  ALLERGIES:  is allergic to aspirin, buspirone, chlorpheniramine, citalopram, ezetimibe, hydrocodone-acetaminophen, prochlorperazine, statins, and venlafaxine.  MEDICATIONS:  Current Outpatient Medications  Medication Sig Dispense Refill   albuterol (PROVENTIL) (2.5 MG/3ML) 0.083% nebulizer solution Take 2.5 mg by nebulization every 6 (six) hours as needed for wheezing or shortness of breath.     albuterol (VENTOLIN HFA) 108 (90 Base) MCG/ACT inhaler Inhale 2 puffs into the lungs every 6 (six) hours as needed  for wheezing or shortness of breath.     celecoxib (CELEBREX) 200 MG capsule Take 200 mg by mouth daily.     clonazePAM (KLONOPIN) 1 MG tablet Take 1 mg by mouth every 6 (six) hours as needed for anxiety.     diclofenac (VOLTAREN) 75 MG EC tablet Take 75 mg by mouth 2 (two) times daily.     FLUoxetine (PROZAC) 20 MG capsule Take 20 mg by mouth 2 (two) times daily.     gabapentin (NEURONTIN) 300 MG capsule Take 300 mg by mouth at bedtime.     levalbuterol (XOPENEX) 0.63 MG/3ML nebulizer solution SMARTSIG:1 Via Inhaler     losartan-hydrochlorothiazide (HYZAAR) 50-12.5 MG tablet Take 1 tablet by mouth daily.     meloxicam (MOBIC) 7.5 MG tablet Take 7.5 mg by mouth 2 (two) times daily.     methocarbamol (ROBAXIN) 500 MG tablet Take 500-1,000 mg by mouth every 6 (six) hours as needed.     mirtazapine (REMERON) 7.5 MG tablet Take 7.5 mg by mouth at bedtime.     montelukast (SINGULAIR) 10 MG tablet Take 1 tablet by mouth at bedtime.     Multiple Vitamin (MULTI-VITAMINS) TABS  Take 1 tablet by mouth daily. Centrum silver     omeprazole (PRILOSEC) 40 MG capsule Take 40 mg by mouth daily.     ondansetron (ZOFRAN) 4 MG tablet Take 4 mg by mouth every 4 (four) hours as needed.     Probiotic Product (PROBIOTIC ADVANCED PO) Take by mouth. 60 billion     simvastatin (ZOCOR) 40 MG tablet Take 20 mg by mouth at bedtime.     SYMBICORT 160-4.5 MCG/ACT inhaler Inhale 2 puffs into the lungs 2 (two) times daily.     No current facility-administered medications for this visit.    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[place])   10/11/2020 Cancer Staging   Staging form: Lung, AJCC 8th Edition - Clinical stage from 10/11/2020: Stage IA2 (cT1b, cN0, cM0) - Signed by Derwood Kaplan, MD on 06/10/2021 Histopathologic type: Adenocarcinoma, NOS Stage prefix: Initial diagnosis Histologic grade (G):  GX Histologic grading system: 4 grade system Laterality: Left Tumor size (mm): 16 Lymph-vascular invasion (LVI): Presence of LVI unknown/indeterminate Diagnostic confirmation: Radiography and other imaging techniques without microscopic confirmation Staged by: Managing physician Stage used in treatment planning: Yes National guidelines used in treatment planning: Yes Type of national guideline used in treatment planning: NCCN Staging comments: SBRT   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[place])  10/11/2020 Initial Diagnosis   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[place])   10/11/2020 Cancer Staging   Staging form: Lung, AJCC 8th Edition - Clinical stage from 10/11/2020: Stage IA3 (cT1c, cN0, cM0) - Signed by Derwood Kaplan, MD on 06/10/2021 Stage prefix: Initial diagnosis Histologic grade (G): GX Histologic grading system: 4 grade system Laterality: Right Tumor size (mm): 22 Lymph-vascular invasion (LVI): Presence of LVI unknown/indeterminate Diagnostic confirmation: Radiography and other imaging techniques without microscopic confirmation Staged by: Managing physician Stage used in treatment planning: Yes National guidelines used in treatment planning: Yes Type of national guideline used in treatment planning: NCCN       REVIEW OF SYSTEMS:  Review of Systems  Constitutional: Negative.  Negative for appetite change, chills, diaphoresis, fatigue, fever and unexpected weight change.  HENT:  Negative.  Negative for hearing loss, lump/mass, mouth sores, nosebleeds, sore throat, tinnitus, trouble swallowing and voice change.   Eyes: Negative.  Negative for eye problems and icterus.  Respiratory: Negative.  Negative for chest tightness, cough, hemoptysis, shortness of breath and wheezing.   Cardiovascular: Negative.  Negative for chest pain, leg swelling and palpitations.  Gastrointestinal: Negative.  Negative for abdominal distention, abdominal pain, blood in stool, constipation, diarrhea,  nausea, rectal pain and vomiting.  Endocrine: Negative.   Genitourinary: Negative.  Negative for bladder incontinence, difficulty urinating, dyspareunia, dysuria, frequency, hematuria, menstrual problem, nocturia, pelvic pain, vaginal bleeding and vaginal discharge.   Musculoskeletal: Negative.  Negative for arthralgias, back pain, flank pain, gait problem, myalgias, neck pain and neck stiffness.  Skin: Negative.  Negative for itching, rash and wound.  Neurological:  Negative for dizziness, extremity weakness, gait problem, headaches, light-headedness, numbness, seizures and speech difficulty.  Hematological: Negative.  Negative for adenopathy. Does not bruise/bleed easily.  Psychiatric/Behavioral: Negative.  Negative for confusion, decreased concentration, depression, sleep disturbance and suicidal ideas. The patient is not nervous/anxious.    VITALS:  There were no vitals taken for this visit.  Wt Readings from Last 3 Encounters:  08/06/22 107 lb 9.6 oz (  48.8 kg)  07/12/22 108 lb 1.6 oz (49 kg)  04/11/22 106 lb 14.4 oz (48.5 kg)    There is no height or weight on file to calculate BMI.  Performance status (ECOG): 1 - Symptomatic but completely ambulatory  PHYSICAL EXAM:   A physical exam was not done since this was a telephone visit.  Physical Exam Vitals and nursing note reviewed.  Constitutional:      General: She is not in acute distress.    Appearance: Normal appearance. She is normal weight. She is not ill-appearing, toxic-appearing or diaphoretic.  HENT:     Head: Normocephalic and atraumatic.     Right Ear: Tympanic membrane, ear canal and external ear normal. There is no impacted cerumen.     Left Ear: Tympanic membrane, ear canal and external ear normal. There is no impacted cerumen.     Nose: Nose normal. No congestion or rhinorrhea.     Mouth/Throat:     Mouth: Mucous membranes are moist.     Pharynx: Oropharynx is clear. No oropharyngeal exudate or posterior  oropharyngeal erythema.  Eyes:     General: No scleral icterus.       Left eye: No discharge.     Extraocular Movements: Extraocular movements intact.     Conjunctiva/sclera: Conjunctivae normal.     Pupils: Pupils are equal, round, and reactive to light.  Neck:     Vascular: No carotid bruit.  Cardiovascular:     Rate and Rhythm: Normal rate and regular rhythm.     Pulses: Normal pulses.     Heart sounds: Normal heart sounds. No murmur heard.    No friction rub. No gallop.  Pulmonary:     Effort: Pulmonary effort is normal. No respiratory distress.     Breath sounds: Normal breath sounds. No stridor. No wheezing, rhonchi or rales.  Chest:     Chest wall: No tenderness.  Abdominal:     General: Bowel sounds are normal. There is no distension.     Palpations: Abdomen is soft. There is no hepatomegaly, splenomegaly or mass.     Tenderness: There is no abdominal tenderness. There is no right CVA tenderness, left CVA tenderness, guarding or rebound.     Hernia: No hernia is present.  Musculoskeletal:        General: No swelling, tenderness, deformity or signs of injury. Normal range of motion.     Cervical back: Normal range of motion and neck supple. No rigidity or tenderness.     Right lower leg: No edema.     Left lower leg: No edema.  Lymphadenopathy:     Cervical: No cervical adenopathy.     Right cervical: No superficial, deep or posterior cervical adenopathy.    Left cervical: No superficial, deep or posterior cervical adenopathy.     Upper Body:     Right upper body: No supraclavicular, axillary or pectoral adenopathy.     Left upper body: No supraclavicular, axillary or pectoral adenopathy.  Skin:    General: Skin is warm and dry.     Coloration: Skin is not jaundiced or pale.     Findings: No bruising, erythema, lesion or rash.  Neurological:     General: No focal deficit present.     Mental Status: She is alert and oriented to person, place, and time. Mental status is  at baseline.     Cranial Nerves: No cranial nerve deficit.     Sensory: No sensory deficit.  Motor: No weakness.     Coordination: Coordination normal.     Gait: Gait normal.     Deep Tendon Reflexes: Reflexes normal.  Psychiatric:        Mood and Affect: Mood normal.        Behavior: Behavior normal.        Thought Content: Thought content normal.        Judgment: Judgment normal.   LABORATORY DATA:  I have reviewed the data as listed  CBC Ref Range & Units 3 d ago (10/29/22) 3 mo ago (07/12/22) 6 mo ago (04/11/22) 9 mo ago (01/09/22) 1 yr ago (09/21/21) 1 yr ago (07/23/21) 1 yr ago (06/06/21)  Hemoglobin 12.0 - 16.0 13.5 13.5 13.3 13.2 13.6 15.1 13.9  HCT 36 - 46 41 41 42 41 39 46 43  Neutrophils Absolute  4.90 7.17 3.50 7.85 6.16 3.94 9.32  Platelets 150 - 400 K/uL 292 338 282 472 Abnormal  255 R 256 R 338 R  WBC  8.3 10.7 7.0 10.9 8.8 8.2 11.1     CMP Ref Range & Units 3 d ago (10/29/22) 3 mo ago (07/12/22) 6 mo ago (04/11/22) 9 mo ago (01/09/22)  Glucose  86 106 94 107  BUN 4 - '21 15 12 14 12  '$ CO2 13 - 22 26 Abnormal  27 Abnormal  27 Abnormal  28 Abnormal   Creatinine 0.5 - 1.1 0.7 0.8 0.7 0.8  Potassium 3.5 - 5.1 mEq/L 3.7 3.6 4.0 3.7  Sodium 137 - 147 137 136 Abnormal  136 Abnormal  138  Chloride 99 - 108 101 102 101 100     CMP Ref Range & Units 3 d ago (10/29/22) 3 mo ago (07/12/22) 6 mo ago (04/11/22) 9 mo ago (01/09/22)  Calcium 8.7 - 10.7 9.0 9.7 9.7 9.4  Albumin 3.5 - 5.0 4.3 4.1 4.2 4.2     Component Ref Range & Units 3 d ago (10/29/22) 3 mo ago (07/12/22) 6 mo ago (04/11/22) 9 mo ago (01/09/22)  Alkaline Phosphatase 25 - 125 61 97 97 133 Abnormal   ALT 7 - 35 U/L '18 24 18 21  '$ AST 13 - 35 28 36 Abnormal  30 31  Bilirubin, Total  0.5 0.6 0.5 0.8     Component 10/29/2022  CEA 3.1      Component Value Date/Time   NA 136 (A) 07/12/2022 0000   K 3.6 07/12/2022 0000   CL 102 07/12/2022 0000   CO2 27 (A) 07/12/2022 0000   GLUCOSE 106 (H) 08/23/2020 1022    BUN 12 07/12/2022 0000   CREATININE 0.8 07/12/2022 0000   CREATININE 0.82 08/23/2020 1022   CALCIUM 9.7 07/12/2022 0000   PROT 6.5 08/23/2020 1022   PROT 6.3 08/02/2020 1233   ALBUMIN 4.1 07/12/2022 0000   ALBUMIN 4.1 08/02/2020 1233   AST 36 (A) 07/12/2022 0000   ALT 24 07/12/2022 0000   ALKPHOS 97 07/12/2022 0000   BILITOT 0.8 08/23/2020 1022   BILITOT 0.3 08/02/2020 1233   GFRNONAA >60 08/23/2020 1022   GFRAA 93 08/02/2020 1233   No results found for: "SPEP", "UPEP"  Lab Results  Component Value Date   WBC 10.7 07/12/2022   NEUTROABS 7.17 07/12/2022   HGB 13.5 07/12/2022   HCT 41 07/12/2022   MCV 91 09/21/2021   PLT 338 07/12/2022     Chemistry      Component Value Date/Time   NA 136 (A) 07/12/2022 0000   K 3.6  07/12/2022 0000   CL 102 07/12/2022 0000   CO2 27 (A) 07/12/2022 0000   BUN 12 07/12/2022 0000   CREATININE 0.8 07/12/2022 0000   CREATININE 0.82 08/23/2020 1022   GLU 106 07/12/2022 0000      Component Value Date/Time   CALCIUM 9.7 07/12/2022 0000   ALKPHOS 97 07/12/2022 0000   AST 36 (A) 07/12/2022 0000   ALT 24 07/12/2022 0000   BILITOT 0.8 08/23/2020 1022   BILITOT 0.3 08/02/2020 1233     Component Ref Range & Units 07/15/2022  Rhuematoid fact SerPl-aCnc <14.0 IU/mL 11.1     Component 07/15/2022  ANA Ab, IFA Negative      RADIOGRAPHIC STUDIES: I have personally reviewed the radiological images as listed and agreed with the findings in the report.     I,Jasmine M Lassiter,acting as a scribe for Derwood Kaplan, MD.,have documented all relevant documentation on the behalf of Derwood Kaplan, MD,as directed by  Derwood Kaplan, MD while in the presence of Derwood Kaplan, MD.

## 2022-10-31 ENCOUNTER — Ambulatory Visit: Payer: Medicare HMO | Admitting: Oncology

## 2022-10-31 DIAGNOSIS — M65331 Trigger finger, right middle finger: Secondary | ICD-10-CM | POA: Diagnosis not present

## 2022-10-31 DIAGNOSIS — I2510 Atherosclerotic heart disease of native coronary artery without angina pectoris: Secondary | ICD-10-CM | POA: Diagnosis not present

## 2022-10-31 DIAGNOSIS — M65321 Trigger finger, right index finger: Secondary | ICD-10-CM | POA: Diagnosis not present

## 2022-10-31 DIAGNOSIS — J45909 Unspecified asthma, uncomplicated: Secondary | ICD-10-CM | POA: Diagnosis not present

## 2022-10-31 DIAGNOSIS — M65341 Trigger finger, right ring finger: Secondary | ICD-10-CM | POA: Diagnosis not present

## 2022-10-31 DIAGNOSIS — I10 Essential (primary) hypertension: Secondary | ICD-10-CM | POA: Diagnosis not present

## 2022-10-31 DIAGNOSIS — M65351 Trigger finger, right little finger: Secondary | ICD-10-CM | POA: Diagnosis not present

## 2022-10-31 DIAGNOSIS — K219 Gastro-esophageal reflux disease without esophagitis: Secondary | ICD-10-CM | POA: Diagnosis not present

## 2022-10-31 DIAGNOSIS — E785 Hyperlipidemia, unspecified: Secondary | ICD-10-CM | POA: Diagnosis not present

## 2022-10-31 LAB — CEA: CEA: 3.1

## 2022-11-01 ENCOUNTER — Other Ambulatory Visit: Payer: Self-pay | Admitting: Oncology

## 2022-11-01 ENCOUNTER — Inpatient Hospital Stay: Payer: Medicare HMO | Attending: Hematology and Oncology | Admitting: Oncology

## 2022-11-01 DIAGNOSIS — C3431 Malignant neoplasm of lower lobe, right bronchus or lung: Secondary | ICD-10-CM

## 2022-11-01 DIAGNOSIS — C3412 Malignant neoplasm of upper lobe, left bronchus or lung: Secondary | ICD-10-CM

## 2022-11-01 DIAGNOSIS — R918 Other nonspecific abnormal finding of lung field: Secondary | ICD-10-CM

## 2022-11-04 ENCOUNTER — Telehealth: Payer: Self-pay | Admitting: Oncology

## 2022-11-04 NOTE — Telephone Encounter (Signed)
11/04/22 Spoke with patient and confirmed next appt

## 2022-11-05 ENCOUNTER — Encounter: Payer: Self-pay | Admitting: Oncology

## 2022-11-12 DIAGNOSIS — J45909 Unspecified asthma, uncomplicated: Secondary | ICD-10-CM | POA: Diagnosis not present

## 2022-11-13 ENCOUNTER — Encounter: Payer: Self-pay | Admitting: Oncology

## 2022-11-15 ENCOUNTER — Encounter: Payer: Self-pay | Admitting: Oncology

## 2022-11-20 DIAGNOSIS — Z853 Personal history of malignant neoplasm of breast: Secondary | ICD-10-CM | POA: Diagnosis not present

## 2022-11-20 DIAGNOSIS — E785 Hyperlipidemia, unspecified: Secondary | ICD-10-CM | POA: Diagnosis not present

## 2022-11-20 DIAGNOSIS — M1990 Unspecified osteoarthritis, unspecified site: Secondary | ICD-10-CM | POA: Diagnosis not present

## 2022-11-20 DIAGNOSIS — M79643 Pain in unspecified hand: Secondary | ICD-10-CM | POA: Diagnosis not present

## 2022-11-20 DIAGNOSIS — I10 Essential (primary) hypertension: Secondary | ICD-10-CM | POA: Diagnosis not present

## 2022-11-20 DIAGNOSIS — M069 Rheumatoid arthritis, unspecified: Secondary | ICD-10-CM | POA: Diagnosis not present

## 2022-11-20 DIAGNOSIS — M2550 Pain in unspecified joint: Secondary | ICD-10-CM | POA: Diagnosis not present

## 2022-11-21 DIAGNOSIS — Z9181 History of falling: Secondary | ICD-10-CM | POA: Diagnosis not present

## 2022-11-21 DIAGNOSIS — E1165 Type 2 diabetes mellitus with hyperglycemia: Secondary | ICD-10-CM | POA: Diagnosis not present

## 2022-11-21 DIAGNOSIS — R634 Abnormal weight loss: Secondary | ICD-10-CM | POA: Diagnosis not present

## 2022-11-21 DIAGNOSIS — E538 Deficiency of other specified B group vitamins: Secondary | ICD-10-CM | POA: Diagnosis not present

## 2022-11-21 DIAGNOSIS — E785 Hyperlipidemia, unspecified: Secondary | ICD-10-CM | POA: Diagnosis not present

## 2022-11-21 DIAGNOSIS — Z139 Encounter for screening, unspecified: Secondary | ICD-10-CM | POA: Diagnosis not present

## 2022-11-21 DIAGNOSIS — Z85118 Personal history of other malignant neoplasm of bronchus and lung: Secondary | ICD-10-CM | POA: Diagnosis not present

## 2022-11-21 DIAGNOSIS — R69 Illness, unspecified: Secondary | ICD-10-CM | POA: Diagnosis not present

## 2022-11-26 DIAGNOSIS — J4550 Severe persistent asthma, uncomplicated: Secondary | ICD-10-CM | POA: Diagnosis not present

## 2022-12-10 DIAGNOSIS — I10 Essential (primary) hypertension: Secondary | ICD-10-CM | POA: Diagnosis not present

## 2022-12-10 DIAGNOSIS — M069 Rheumatoid arthritis, unspecified: Secondary | ICD-10-CM | POA: Diagnosis not present

## 2022-12-10 DIAGNOSIS — M79643 Pain in unspecified hand: Secondary | ICD-10-CM | POA: Diagnosis not present

## 2022-12-10 DIAGNOSIS — M2550 Pain in unspecified joint: Secondary | ICD-10-CM | POA: Diagnosis not present

## 2022-12-10 DIAGNOSIS — E785 Hyperlipidemia, unspecified: Secondary | ICD-10-CM | POA: Diagnosis not present

## 2022-12-10 DIAGNOSIS — M1990 Unspecified osteoarthritis, unspecified site: Secondary | ICD-10-CM | POA: Diagnosis not present

## 2022-12-13 DIAGNOSIS — J45909 Unspecified asthma, uncomplicated: Secondary | ICD-10-CM | POA: Diagnosis not present

## 2022-12-25 DIAGNOSIS — E538 Deficiency of other specified B group vitamins: Secondary | ICD-10-CM | POA: Diagnosis not present

## 2022-12-30 DIAGNOSIS — R69 Illness, unspecified: Secondary | ICD-10-CM | POA: Diagnosis not present

## 2022-12-30 DIAGNOSIS — R5383 Other fatigue: Secondary | ICD-10-CM | POA: Diagnosis not present

## 2022-12-30 DIAGNOSIS — R918 Other nonspecific abnormal finding of lung field: Secondary | ICD-10-CM | POA: Diagnosis not present

## 2022-12-30 DIAGNOSIS — J4550 Severe persistent asthma, uncomplicated: Secondary | ICD-10-CM | POA: Diagnosis not present

## 2023-01-10 ENCOUNTER — Emergency Department (HOSPITAL_COMMUNITY): Payer: Medicare HMO

## 2023-01-10 ENCOUNTER — Telehealth: Payer: Self-pay | Admitting: Internal Medicine

## 2023-01-10 ENCOUNTER — Other Ambulatory Visit: Payer: Self-pay

## 2023-01-10 ENCOUNTER — Encounter (HOSPITAL_COMMUNITY): Payer: Self-pay

## 2023-01-10 ENCOUNTER — Emergency Department (HOSPITAL_COMMUNITY)
Admission: EM | Admit: 2023-01-10 | Discharge: 2023-01-10 | Disposition: A | Discharge status: Home / Self Care | Payer: Medicare HMO | Attending: Emergency Medicine | Admitting: Emergency Medicine

## 2023-01-10 DIAGNOSIS — J9811 Atelectasis: Secondary | ICD-10-CM | POA: Diagnosis not present

## 2023-01-10 DIAGNOSIS — I4891 Unspecified atrial fibrillation: Secondary | ICD-10-CM | POA: Diagnosis not present

## 2023-01-10 DIAGNOSIS — R079 Chest pain, unspecified: Secondary | ICD-10-CM | POA: Insufficient documentation

## 2023-01-10 DIAGNOSIS — R0789 Other chest pain: Secondary | ICD-10-CM | POA: Diagnosis not present

## 2023-01-10 DIAGNOSIS — R002 Palpitations: Secondary | ICD-10-CM | POA: Insufficient documentation

## 2023-01-10 DIAGNOSIS — J45909 Unspecified asthma, uncomplicated: Secondary | ICD-10-CM | POA: Insufficient documentation

## 2023-01-10 LAB — BASIC METABOLIC PANEL
Anion gap: 9 (ref 5–15)
BUN: 13 mg/dL (ref 8–23)
CO2: 26 mmol/L (ref 22–32)
Calcium: 8.8 mg/dL — ABNORMAL LOW (ref 8.9–10.3)
Chloride: 103 mmol/L (ref 98–111)
Creatinine, Ser: 0.95 mg/dL (ref 0.44–1.00)
GFR, Estimated: >60 mL/min (ref >60)
Glucose, Bld: 74 mg/dL (ref 70–99)
Potassium: 4.1 mmol/L (ref 3.5–5.1)
Sodium: 138 mmol/L (ref 135–145)

## 2023-01-10 LAB — TROPONIN I (HIGH SENSITIVITY)
Troponin I (High Sensitivity): 21 ng/L — ABNORMAL HIGH (ref <18)
Troponin I (High Sensitivity): 20 ng/L — ABNORMAL HIGH (ref <18)

## 2023-01-10 LAB — CBC
HCT: 42.3 % (ref 36.0–46.0)
Hemoglobin: 13.3 g/dL (ref 12.0–15.0)
MCH: 28.2 pg (ref 26.0–34.0)
MCHC: 31.4 g/dL (ref 30.0–36.0)
MCV: 89.6 fL (ref 80.0–100.0)
Platelets: 329 10*3/uL (ref 150–400)
RBC: 4.72 MIL/uL (ref 3.87–5.11)
RDW: 15.9 % — ABNORMAL HIGH (ref 11.5–15.5)
WBC: 10.3 10*3/uL (ref 4.0–10.5)
nRBC: 0 % (ref 0.0–0.2)

## 2023-01-10 LAB — MAGNESIUM: Magnesium: 1.9 mg/dL (ref 1.7–2.4)

## 2023-01-10 MED ORDER — IPRATROPIUM-ALBUTEROL 0.5-2.5 (3) MG/3ML IN SOLN
3.0000 mL | Freq: Once | RESPIRATORY_TRACT | Status: AC
  Administered 2023-01-10: 3 mL via RESPIRATORY_TRACT
  Filled 2023-01-10: qty 3

## 2023-01-10 NOTE — ED Provider Notes (Signed)
EMERGENCY DEPARTMENT AT [facility] Provider Note   CSN: [id number] Arrival date & time: 01/10/23  1520     History Chief Complaint  Patient presents with   Palpitations    HPI Angela Phillips is a 77 y.o. female presenting for episodic shortness of breath.  She states that she has a history of asthma as well as A-fib status post ablation.  States that she has been having episodic shortness of breath over the last week.  Came off of prednisone for arthritis 3 days ago and states the breathing is gotten worse since then.  Has not been using her home albuterol which she states she has significant amounts of. Denies fevers chills nausea vomiting syncope shortness of breath.  Is requesting discharge at time of my evaluation as she states that she feels fine and would like to follow-up in the outpatient setting.  States that all symptoms are currently resolved less episode of chest pain was this morning at 11 AM.   Patient's recorded medical, surgical, social, medication list and allergies were reviewed in the Snapshot window as part of the initial history.   Review of Systems   Review of Systems  Constitutional:  Negative for chills and fever.  HENT:  Negative for ear pain and sore throat.   Eyes:  Negative for pain and visual disturbance.  Respiratory:  Positive for chest tightness, shortness of breath and wheezing. Negative for cough.   Cardiovascular:  Negative for chest pain and palpitations.  Gastrointestinal:  Negative for abdominal pain and vomiting.  Genitourinary:  Negative for dysuria and hematuria.  Musculoskeletal:  Negative for arthralgias and back pain.  Skin:  Negative for color change and rash.  Neurological:  Negative for seizures and syncope.  All other systems reviewed and are negative.   Physical Exam Updated Vital Signs BP 134/70   Pulse 73   Temp 98 F (36.7 C) (Oral)   Resp (!) 24   Ht 5\' 1"  (1.549 m)   Wt 54 kg   SpO2 97%   BMI  22.48 kg/m  Physical Exam Vitals and nursing note reviewed.  Constitutional:      General: She is not in acute distress.    Appearance: She is well-developed.  HENT:     Head: Normocephalic and atraumatic.  Eyes:     Conjunctiva/sclera: Conjunctivae normal.  Cardiovascular:     Rate and Rhythm: Normal rate and regular rhythm.     Heart sounds: No murmur heard. Pulmonary:     Effort: Pulmonary effort is normal. No respiratory distress.     Breath sounds: Wheezing present.  Abdominal:     General: There is no distension.     Palpations: Abdomen is soft.     Tenderness: There is no abdominal tenderness. There is no right CVA tenderness or left CVA tenderness.  Musculoskeletal:        General: No swelling or tenderness. Normal range of motion.     Cervical back: Neck supple.  Skin:    General: Skin is warm and dry.  Neurological:     General: No focal deficit present.     Mental Status: She is alert and oriented to person, place, and time. Mental status is at baseline.     Cranial Nerves: No cranial nerve deficit.      ED Course/ Medical Decision Making/ A&P    Procedures Procedures   Medications Ordered in ED Medications  ipratropium-albuterol (DUONEB) 0.5-2.5 (3) MG/3ML nebulizer solution 3  mL (3 mLs Nebulization Given 01/10/23 2016)    Medical Decision Making:    Angela Phillips is a 77 y.o. female who presented to the ED today with chest pain shortness of breath detailed above.     Complete initial physical exam performed, notably the patient  was hemodynamically stable in no acute distress.  Patient is already requesting discharge at time of my initial evaluation. Vital signs are reassuring.      Reviewed and confirmed nursing documentation for past medical history, family history, social history.    Initial Assessment:   With the patient's presentation of chest pain/shortness of breath, most likely diagnosis is asthma exacerbation. Other diagnoses were  considered including (but not limited to) ACS, pulmonary embolism, pneumothorax, pneumonia, aortic dissection. These are considered less likely due to history of present illness and physical exam findings.   This is most consistent with an acute life/limb threatening illness complicated by underlying chronic conditions.  Initial Plan:  Screening labs including CBC and Metabolic panel to evaluate for infectious or metabolic etiology of disease.  Urinalysis with reflex culture ordered to evaluate for UTI or relevant urologic/nephrologic pathology.  CXR to evaluate for structural/infectious intrathoracic pathology.  Serial troponin/EKG to evaluate for cardiac pathology. Objective evaluation as below reviewed with plan for close reassessment  Initial Study Results:   Laboratory  All laboratory results reviewed without evidence of clinically relevant pathology.   Exceptions include: Troponin 21 downtrending to 20  EKG EKG was reviewed independently. Rate, rhythm, axis, intervals all examined and without medically relevant abnormality. ST segments without concerns for elevations.    Radiology  All images reviewed independently. Agree with radiology report at this time.   DG Chest Port 1 View  Result Date: 01/10/2023 CLINICAL DATA:  Atrial fibrillation. EXAM: PORTABLE CHEST 1 VIEW COMPARISON:  04/22/2022. FINDINGS: Unchanged elevation of the left hemidiaphragm with adjacent left basilar atelectasis and associated rightward mediastinal shift. No consolidation or pulmonary edema. No pleural effusion or pneumothorax. IMPRESSION: 1.  No evidence of acute cardiopulmonary disease. 2. Unchanged elevation of the left hemidiaphragm. Electronically Signed   By: Orvan Falconer M.D.   On: 01/10/2023 16:24     Final Assessment and Plan:   Patient observed in emergency department for total of 7 hours.  Vital signs stable and symptoms remain resolved.  Patient notes substantially improving respiratory symptoms  after administration of DuoNeb. Additionally, she states that chest pain has remained resolved.  We did shared medical decision making conversation regarding symptoms.  Given serial negative troponins, description of symptoms and overall improving nature as well as resolved chest pain, ACS is favored grossly less likely.  Patient will follow-up with her cardiologist who she has established with in the past. In addition, she will continue with prednisone 10 mg daily x 5 days for mild asthma exacerbation.  She would additionally like to use her home DuoNeb 3 times daily for the next 3 days which is reasonable.  Given overall well appearance and resolution of chest pain I believe patient stable for outpatient care management.  We discussed risk for residual cardiac symptoms and patient's breast understanding but would like to follow-up in the outpatient setting given reassuring objective evaluation today.  Overall this is reasonable and patient is encouraged to return if he has any worsening symptoms. Disposition:  I have considered need for hospitalization, however, considering all of the above, I believe this patient is stable for discharge at this time.  Patient/family educated about specific return precautions for  given chief complaint and symptoms.  Patient/family educated about follow-up with PCP/Cardiology.     Patient/family expressed understanding of return precautions and need for follow-up. Patient spoken to regarding all imaging and laboratory results and appropriate follow up for these results. All education provided in verbal form with additional information in written form. Time was allowed for answering of patient questions. Patient discharged.    Emergency Department Medication Summary:   Medications  ipratropium-albuterol (DUONEB) 0.5-2.5 (3) MG/3ML nebulizer solution 3 mL (3 mLs Nebulization Given 01/10/23 2016)         Clinical Impression:  1. Chest pain, unspecified type   2.  Heart palpitations      Discharge   Final Clinical Impression(s) / ED Diagnoses Final diagnoses:  Chest pain, unspecified type  Heart palpitations    Rx / DC Orders ED Discharge Orders     None         Glyn Ade, MD 01/10/23 2231

## 2023-01-10 NOTE — Telephone Encounter (Signed)
Pt Urgent call transferred to [facility] Triage.   Pt lives in [place], and states she has had cardiac related symptoms for 1 week;   Pt states they occur 2-4 times per day, and include:  Midline chest pain with weakness, palpitations, and palpitations also felt in her neck; Lightheadedness with ambulation; dizziness going from sitting to standing position.   I had Pt do a BP and HR at home, and after husband assisted Pt, BP 146/74, HR 84.   During our conversation, her symptoms were ok, but Pt states she had two different chest pain / weakness / lightheadedness episodes this morning already.   Note:  Pt had an ablation in February 2020 per Dr. Royann Shivers note 08/02/2020.      With multiple symptoms being present, 2-4 times a day, for 1 week, Pt advised to go to nearest ER for providers care.  Pt advised to share worsening symptoms / chest pain with Triage RN, and RN call note would be in the computer for ER staff to review.  Pt verbalized understanding of plan of care, and stated she will check in at the ER.

## 2023-01-10 NOTE — Telephone Encounter (Signed)
Pt c/o of Chest Pain: STAT if CP now or developed within 24 hours  1. Are you having CP right now?   No, but 1 time today  2. Are you experiencing any other symptoms (ex. SOB, nausea, vomiting, sweating)?   SOB  3. How long have you been experiencing CP?  Started about a week ago  4. Is your CP continuous or coming and going?   Coming and going when she has spells and she has to lay down  5. Have you taken Nitroglycerin?   No  Patient stated she is having chest pains and irregular heart beats.  Patient states her chest feels heavy 3-4 times a day.

## 2023-01-10 NOTE — ED Triage Notes (Signed)
Pt states she's been having episodes with her heart beating really fast and feeling weak. Hx of ablation in 2020 and afib. Pt in NAD at this time.

## 2023-01-12 DIAGNOSIS — J45909 Unspecified asthma, uncomplicated: Secondary | ICD-10-CM | POA: Diagnosis not present

## 2023-01-20 DIAGNOSIS — I471 Supraventricular tachycardia, unspecified: Secondary | ICD-10-CM | POA: Diagnosis not present

## 2023-01-20 DIAGNOSIS — R002 Palpitations: Secondary | ICD-10-CM | POA: Diagnosis not present

## 2023-01-20 DIAGNOSIS — R0789 Other chest pain: Secondary | ICD-10-CM | POA: Diagnosis not present

## 2023-01-20 DIAGNOSIS — J4540 Moderate persistent asthma, uncomplicated: Secondary | ICD-10-CM | POA: Diagnosis not present

## 2023-01-27 DIAGNOSIS — J4550 Severe persistent asthma, uncomplicated: Secondary | ICD-10-CM | POA: Diagnosis not present

## 2023-01-27 NOTE — Progress Notes (Unsigned)
Cardiology Office Note:    Date:  01/28/2023   ID:  LUNAFREYA Phillips, DOB [birth year], MRN [id number]  PCP:  Hurshel Party, NP  Cardiologist:  None  Electrophysiologist:  None   Referring MD: Hurshel Party, NP   Chief Complaint: ED follow-up of chest pain  History of Present Illness:    Angela Phillips is a 77 y.o. female with a history of paroxysmal SVT s/p AVNRT ablation in 10/2018, hypertension, hyperlipidemia, severe COPD/ asthma, rheumatoid arthritis, and remote tobacco abuse who is followed by Dr. Royann Shivers and Dr. Ladona Ridgel and presents for ED follow-up of chest pain.  Patient has a long history of palpitations since around the year 2000. She was ultimately found to have paroxysmal atrial fibrillation and underwent a AVNRT ablation in 10/2018 with Dr. Ladona Ridgel. She has done well from an SVT standpoint since the ablation with no recurrence. Patient was last seen by Dr. Royann Shivers in 07/2020 at which time she reported exertional dyspnea and chest heaviness. She was also in need of a surgical resection for a lung nodule that was suspicious for malignancy. A Myoview was ordered for further evaluation and was low risk with no evidence of ischemia. She was ultimately diagnosed with lung cancer.   Patient was recently seen in the ED on 01/10/2023 for intermittent episodes of shortness of breath and associated chest pain. EKG showed no acute ischemic changes. High-sensitivity troponin was 21 >> 20. Presentation was felt to be most consistent with an asthma exacerbation and she was prescribed Prednisone and advised to follow-up with Cardiology as an outpatient.   Patient presents today for follow-up.  Here with her husband.  Patient reports that for about the past month and a half she has been having intermittent episodes that start with a feeling of her heart beating hard on both sides of her neck followed by significant shortness of breath, lightheadedness/dizziness, near syncope, and left-sided chest pain  that she states "just hurts."  These episodes occur when she is exerting herself such as when she is vacuuming, walking, or working in the garden and resolve after she goes and lays down for several minutes.  The episodes last around a total of 10 minutes.  She denies any syncope.  She denies any chest pain outside of these events.  She states the left-sided chest pain is similar to the symptoms she had with SVT prior to her ablation but overall these episodes feel different than when she had SVT.  She occasionally has some shortness of breath due to her to her COPD and asthma but she states that the shortness of breath during these episodes are very different than that.  She has a pulse oximeter at home and states her O2 sats are always normal during these episodes and states her heart rates are normally in the 60-70 range.  She denies any orthopnea, PND, or edema.  Past Medical History:  Diagnosis Date   Asthma    COPD (chronic obstructive pulmonary disease) (HCC)    Dyslipidemia (high LDL; low HDL) 08/13/2016   Dysuria 95/62/1308   History of radiation therapy 10/24/2020-11/06/2020   SBRT to left and right lungs   Dr Antony Blackbird   HTN (hypertension) 11/20/2016   Hypokalemia 07/23/2021   Paroxysmal SVT (supraventricular tachycardia) 07/05/2016    Past Surgical History:  Procedure Laterality Date   ABDOMINAL HYSTERECTOMY     CARDIAC CATHETERIZATION     CARPAL TUNNEL RELEASE     CATARACT EXTRACTION  CHOLECYSTECTOMY     HEMORROIDECTOMY     JOINT REPLACEMENT     R knee   ORIF left elbow Left 12/2021   SVT ABLATION N/A 10/19/2018   Procedure: SVT ABLATION;  Surgeon: Marinus Maw, MD;  Location: [facility];  Service: Cardiovascular;  Laterality: N/A;   TOE SURGERY     VIDEO BRONCHOSCOPY WITH ENDOBRONCHIAL NAVIGATION  08/24/2020   VIDEO BRONCHOSCOPY WITH ENDOBRONCHIAL NAVIGATION N/A 08/24/2020   Procedure: VIDEO BRONCHOSCOPY WITH ENDOBRONCHIAL NAVIGATION;  Surgeon: Loreli Slot, MD;  Location: [facility] OR;  Service: Thoracic;  Laterality: N/A;    Current Medications: Current Meds  Medication Sig   albuterol (PROVENTIL) (2.5 MG/3ML) 0.083% nebulizer solution Take 2.5 mg by nebulization every 6 (six) hours as needed for wheezing or shortness of breath.   albuterol (VENTOLIN HFA) 108 (90 Base) MCG/ACT inhaler Inhale 2 puffs into the lungs every 6 (six) hours as needed for wheezing or shortness of breath.   celecoxib (CELEBREX) 200 MG capsule Take 200 mg by mouth daily.   clonazePAM (KLONOPIN) 1 MG tablet Take 1 mg by mouth every 6 (six) hours as needed for anxiety.   diclofenac (VOLTAREN) 75 MG EC tablet Take 75 mg by mouth 2 (two) times daily.   diphenoxylate-atropine (LOMOTIL) 2.5-0.025 MG tablet Take 1 tablet by mouth as needed.   FLUoxetine (PROZAC) 20 MG capsule Take 20 mg by mouth 2 (two) times daily.   gabapentin (NEURONTIN) 300 MG capsule Take 300 mg by mouth at bedtime.   levalbuterol (XOPENEX) 0.63 MG/3ML nebulizer solution SMARTSIG:1 Via Inhaler   losartan-hydrochlorothiazide (HYZAAR) 50-12.5 MG tablet Take 1 tablet by mouth daily.   meloxicam (MOBIC) 7.5 MG tablet Take 7.5 mg by mouth 2 (two) times daily.   methocarbamol (ROBAXIN) 500 MG tablet Take 500-1,000 mg by mouth every 6 (six) hours as needed.   Multiple Vitamin (MULTI-VITAMINS) TABS Take 1 tablet by mouth daily. Centrum silver   omeprazole (PRILOSEC) 40 MG capsule Take 40 mg by mouth daily.   ondansetron (ZOFRAN) 4 MG tablet Take 4 mg by mouth every 4 (four) hours as needed.   simvastatin (ZOCOR) 40 MG tablet Take 20 mg by mouth at bedtime.   SYMBICORT 160-4.5 MCG/ACT inhaler Inhale 2 puffs into the lungs 2 (two) times daily.   traMADol (ULTRAM) 50 MG tablet Take by mouth.     Allergies:   Aspirin, Buspirone, Celecoxib, Chlorpheniramine, Citalopram, Ezetimibe, Hydrocodone-acetaminophen, Prochlorperazine, Statins, and Venlafaxine   Social History   Socioeconomic History   Marital status:  Married    Spouse name: Not on file   Number of children: Not on file   Years of education: Not on file   Highest education level: Not on file  Occupational History   Not on file  Tobacco Use   Smoking status: Former    Packs/day: 0.50    Years: 15.00    Additional pack years: 0.00    Total pack years: 7.50    Types: Cigarettes   Smokeless tobacco: Former    Quit date: 09/09/1989  Vaping Use   Vaping Use: Never used  Substance and Sexual Activity   Alcohol use: No   Drug use: No   Sexual activity: Not on file  Other Topics Concern   Not on file  Social History Narrative   Not on file   Social Determinants of Health   Financial Resource Strain: Low Risk  (08/20/2022)   Overall Financial Resource Strain (CARDIA)    Difficulty of  Paying Living Expenses: Not very hard  Food Insecurity: No Food Insecurity (08/20/2022)   Hunger Vital Sign    Worried About Running Out of Food in the Last Year: Never true    Ran Out of Food in the Last Year: Never true  Transportation Needs: No Transportation Needs (08/20/2022)   PRAPARE - Administrator, Civil Service (Medical): No    Lack of Transportation (Non-Medical): No  Physical Activity: Inactive (08/20/2022)   Exercise Vital Sign    Days of Exercise per Week: 0 days    Minutes of Exercise per Session: 0 min  Stress: Stress Concern Present (08/20/2022)   [employer] of Occupational Health - Occupational Stress Questionnaire    Feeling of Stress : To some extent  Social Connections: Moderately Isolated (08/20/2022)   Social Connection and Isolation Panel [NHANES]    Frequency of Communication with Friends and Family: More than three times a week    Frequency of Social Gatherings with Friends and Family: More than three times a week    Attends Religious Services: Never    Database administrator or Organizations: No    Attends Engineer, structural: Never    Marital Status: Married     Family History: The  patient's family history includes Dementia in her father; Diabetes in her mother; Heart disease in her mother; Thyroid disease in her brother and sister.  ROS:   Please see the history of present illness.     EKGs/Labs/Other Studies Reviewed:    The following studies were reviewed:  Myoview 08/11/2020: The left ventricular ejection fraction is hyperdynamic (>65%). This is a low risk study. The study is normal.   Low risk stress nuclear study with normal perfusion and normal left ventricular regional and global systolic function.  EKG:  EKG ordered today. EKG personally reviewed and demonstrates normal sinus rhythm, rate 81 bpm, with incomplete RBBB but no acute ST/T changes. No acute changes compared to prior tracings.   Recent Labs: 10/29/2022: ALT 18 01/10/2023: BUN 13; Creatinine, Ser 0.95; Hemoglobin 13.3; Magnesium 1.9; Platelets 329; Potassium 4.1; Sodium 138  Recent Lipid Panel    Component Value Date/Time   CHOL 270 (H) 08/02/2020 1233   TRIG 176 (H) 08/02/2020 1233   HDL 56 08/02/2020 1233   CHOLHDL 4.8 (H) 08/02/2020 1233   LDLCALC 181 (H) 08/02/2020 1233    Physical Exam:    Vital Signs: BP 116/70 (BP Location: Right Arm, Patient Position: Sitting, Cuff Size: Normal)   Pulse 81   Ht 5\' 1"  (1.549 m)   Wt 116 lb 12.8 oz (53 kg)   SpO2 98%   BMI 22.07 kg/m     Wt Readings from Last 3 Encounters:  01/28/23 116 lb 12.8 oz (53 kg)  01/10/23 119 lb (54 kg)  08/06/22 107 lb 9.6 oz (48.8 kg)     General: 77 y.o. thin Caucasian female in no acute distress. HEENT: Normocephalic and atraumatic. Sclera clear.  Neck: Supple. No carotid bruits. No JVD. Heart: RRR. Distinct S1 and S2. No murmurs, gallops, or rubs. Lungs: No increased work of breathing. Clear to ausculation bilaterally. No wheezes, rhonchi, or rales.  Abdomen: Soft, non-distended, and non-tender to palpation.  MSK: Normal strength and tone for age.  Extremities: No lower extremity edema.    Skin: Warm and  dry. Neuro: Alert and oriented x3. No focal deficits. Psych: Normal affect. Responds appropriately.   Assessment:    1. Palpitations   2. Dizziness  3. Near syncope   4. Paroxysmal SVT (supraventricular tachycardia)   5. Chest pain of uncertain etiology   6. Coronary artery calcification   7. Primary hypertension   8. Hyperlipidemia, unspecified hyperlipidemia type     Plan:    Palpitations with Dizziness and Near Syncope Paroxysmal SVT S/p AVNRT ablation in 10/2018. She describes intermittent episodes of palpitations that she describes as a feeling of her heart beating hard on both sides of her neck followed by significant shortness of breath, lightheadedness/dizziness, near syncope, and left-sided chest pain. These episodes have been occurring on a daily basis for the past 1.5 months. They occur during exertion and resolve with rest. Magnesium and Potassium were normal during recent ED visit earlier this month and patient states her PCP just checked a TSH level within the past week and it was normal.  - Will order 1 Zio monitor and Echo.   Chest Pain Coronary Artery Calcifications Patient has a history of chest pain. Myoview in 07/2020 was low risk with no evidence of ischemia. Chest CT in 2022 showed coronary artery calcifications. She was recently seen in the ED for shortness of breath and chest pain. EKG showed no acute ischemic changes. High-sensitivity troponin was 21 >> 20. Symptoms were felt to be most consistent with an asthma exacerbation and she was treated with Prednisone.  - Episodes of chest pain occur solely after episodes of palpitations as described above.  - EKG showed no acute ischemic changes.  - Will start with Zio monitor and Echo as above. If Zio monitor does not show any significant arrhythmias that correlate with these episodes, will consider ischemic evaluation with cardiac PET or coronary CTA.  Hypertension BP well controlled.  - Continue Losartan-HCTZ  50-12.5mg  daily.   Hyperlipidemia - Continue Simvastatin 20mg  daily.  Disposition: Follow up with me in 2-3 weeks.    Medication Adjustments/Labs and Tests Ordered: Current medicines are reviewed at length with the patient today.  Concerns regarding medicines are outlined above.  Orders Placed This Encounter  Procedures   LONG TERM MONITOR-LIVE TELEMETRY (3-14 DAYS)   EKG 12-Lead   No orders of the defined types were placed in this encounter.   Patient Instructions  Medication Instructions:  No changes *If you need a refill on your cardiac medications before your next appointment, please call your pharmacy*   Lab Work: No Labs If you have labs (blood work) drawn today and your tests are completely normal, you will receive your results only by: MyChart Message (if you have MyChart) OR A paper copy in the mail If you have any lab test that is abnormal or we need to change your treatment, we will call you to review the results.   Testing/Procedures: Christena Deem- Long Term Monitor Instructions  Your physician has requested you wear a ZIO patch monitor for 7 days.  This is a single patch monitor. [employer] supplies one patch monitor per enrollment. Additional stickers are not available. Please do not apply patch if you will be having a Nuclear Stress Test,  Echocardiogram, Cardiac CT, MRI, or Chest Xray during the period you would be wearing the  monitor. The patch cannot be worn during these tests. You cannot remove and re-apply the  ZIO XT patch monitor.  Your ZIO patch monitor will be mailed 3 day USPS to your address on file. It may take 3-5 days  to receive your monitor after you have been enrolled.  Once you have received your monitor, please review the enclosed  instructions. Your monitor  has already been registered assigning a specific monitor serial # to you.  Billing and Patient Assistance Program Information  We have supplied [employer] with any of your insurance  information on file for billing purposes. [employer] offers a sliding scale Patient Assistance Program for patients that do not have  insurance, or whose insurance does not completely cover the cost of the ZIO monitor.  You must apply for the Patient Assistance Program to qualify for this discounted rate.  To apply, please call [employer] at [phone], select option 4, select option 2, ask to apply for  Patient Assistance Program. [name] will ask your household income, and how many people  are in your household. They will quote your out-of-pocket cost based on that information.  [employer] will also be able to set up a 37-month, interest-free payment plan if needed.  Applying the monitor   Shave hair from upper left chest.  Hold abrader disc by orange tab. Rub abrader in 40 strokes over the upper left chest as  indicated in your monitor instructions.  Clean area with 4 enclosed alcohol pads. Let dry.  Apply patch as indicated in monitor instructions. Patch will be placed under collarbone on left  side of chest with arrow pointing upward.  Rub patch adhesive wings for 2 minutes. Remove white label marked "1". Remove the white  label marked "2". Rub patch adhesive wings for 2 additional minutes.  While looking in a mirror, press and release button in center of patch. A small green light will  flash 3-4 times. This will be your only indicator that the monitor has been turned on.  Do not shower for the first 24 hours. You may shower after the first 24 hours.  Press the button if you feel a symptom. You will hear a small click. Record Date, Time and  Symptom in the Patient Logbook.  When you are ready to remove the patch, follow instructions on the last 2 pages of Patient  Logbook. Stick patch monitor onto the last page of Patient Logbook.  Place Patient Logbook in the blue and white box. Use locking tab on box and tape box closed  securely. The blue and white box has prepaid postage on it. Please  place it in the mailbox as  soon as possible. Your physician should have your test results approximately 7 days after the  monitor has been mailed back to [facility].  Call [facility] Customer Care at [phone] if you have questions regarding  your ZIO XT patch monitor. Call them immediately if you see an orange light blinking on your  monitor.  If your monitor falls off in less than 4 days, contact our Monitor department at [phone].  If your monitor becomes loose or falls off after 4 days call [employer] at [phone] for  suggestions on securing your monitor    Follow-Up: At [facility], you and your health needs are our priority.  As part of our continuing mission to provide you with exceptional heart care, we have created designated Provider Care Teams.  These Care Teams include your primary Cardiologist (physician) and Advanced Practice Providers (APPs -  Physician Assistants and Nurse Practitioners) who all work together to provide you with the care you need, when you need it.  We recommend signing up for the patient portal called "MyChart".  Sign up information is provided on this After Visit Summary.  MyChart is used to connect with patients for Virtual Visits (Telemedicine).  Patients are  able to view lab/test results, encounter notes, upcoming appointments, etc.  Non-urgent messages can be sent to your provider as well.   To learn more about what you can do with MyChart, go to [email or web address].    Your next appointment:   Keep Scheduled Appointment  Provider:   Marjie Skiff, PA-C         Signed, Corrin Parker, PA-C  01/28/2023 9:23 PM    [facility]

## 2023-01-28 ENCOUNTER — Encounter: Payer: Self-pay | Admitting: Student

## 2023-01-28 ENCOUNTER — Ambulatory Visit: Payer: Medicare HMO | Attending: Physician Assistant | Admitting: Student

## 2023-01-28 ENCOUNTER — Other Ambulatory Visit (INDEPENDENT_AMBULATORY_CARE_PROVIDER_SITE_OTHER): Payer: Medicare HMO

## 2023-01-28 VITALS — BP 116/70 | HR 81 | Ht 61.0 in | Wt 116.8 lb

## 2023-01-28 DIAGNOSIS — R079 Chest pain, unspecified: Secondary | ICD-10-CM | POA: Diagnosis not present

## 2023-01-28 DIAGNOSIS — R42 Dizziness and giddiness: Secondary | ICD-10-CM | POA: Diagnosis not present

## 2023-01-28 DIAGNOSIS — I10 Essential (primary) hypertension: Secondary | ICD-10-CM

## 2023-01-28 DIAGNOSIS — I471 Supraventricular tachycardia, unspecified: Secondary | ICD-10-CM

## 2023-01-28 DIAGNOSIS — I2584 Coronary atherosclerosis due to calcified coronary lesion: Secondary | ICD-10-CM | POA: Diagnosis not present

## 2023-01-28 DIAGNOSIS — R002 Palpitations: Secondary | ICD-10-CM

## 2023-01-28 DIAGNOSIS — I2510 Atherosclerotic heart disease of native coronary artery without angina pectoris: Secondary | ICD-10-CM | POA: Diagnosis not present

## 2023-01-28 DIAGNOSIS — R55 Syncope and collapse: Secondary | ICD-10-CM

## 2023-01-28 DIAGNOSIS — E785 Hyperlipidemia, unspecified: Secondary | ICD-10-CM

## 2023-01-28 NOTE — Addendum Note (Signed)
Addended by: Marjie Skiff on: 01/28/2023 09:25 PM   Modules accepted: Orders

## 2023-01-28 NOTE — Progress Notes (Unsigned)
Enrolled for [employer] to mail a ZIO AT Live Telemetry monitor to patients address on file.   Dr. Croitoru to read. 

## 2023-01-28 NOTE — Patient Instructions (Signed)
Medication Instructions:  No changes *If you need a refill on your cardiac medications before your next appointment, please call your pharmacy*   Lab Work: No Labs If you have labs (blood work) drawn today and your tests are completely normal, you will receive your results only by: MyChart Message (if you have MyChart) OR A paper copy in the mail If you have any lab test that is abnormal or we need to change your treatment, we will call you to review the results.   Testing/Procedures: Angela Phillips- Long Term Monitor Instructions  Your physician has requested you wear a ZIO patch monitor for 7 days.  This is a single patch monitor. [employer] supplies one patch monitor per enrollment. Additional stickers are not available. Please do not apply patch if you will be having a Nuclear Stress Test,  Echocardiogram, Cardiac CT, MRI, or Chest Xray during the period you would be wearing the  monitor. The patch cannot be worn during these tests. You cannot remove and re-apply the  ZIO XT patch monitor.  Your ZIO patch monitor will be mailed 3 day USPS to your address on file. It may take 3-5 days  to receive your monitor after you have been enrolled.  Once you have received your monitor, please review the enclosed instructions. Your monitor  has already been registered assigning a specific monitor serial # to you.  Billing and Patient Assistance Program Information  We have supplied [employer] with any of your insurance information on file for billing purposes. [employer] offers a sliding scale Patient Assistance Program for patients that do not have  insurance, or whose insurance does not completely cover the cost of the ZIO monitor.  You must apply for the Patient Assistance Program to qualify for this discounted rate.  To apply, please call [employer] at [phone], select option 4, select option 2, ask to apply for  Patient Assistance Program. Angela Phillips will ask your household income, and how many people   are in your household. They will quote your out-of-pocket cost based on that information.  [employer] will also be able to set up a 55-month, interest-free payment plan if needed.  Applying the monitor   Shave hair from upper left chest.  Hold abrader disc by orange tab. Rub abrader in 40 strokes over the upper left chest as  indicated in your monitor instructions.  Clean area with 4 enclosed alcohol pads. Let dry.  Apply patch as indicated in monitor instructions. Patch will be placed under collarbone on left  side of chest with arrow pointing upward.  Rub patch adhesive wings for 2 minutes. Remove white label marked "1". Remove the white  label marked "2". Rub patch adhesive wings for 2 additional minutes.  While looking in a mirror, press and release button in center of patch. A small green light will  flash 3-4 times. This will be your only indicator that the monitor has been turned on.  Do not shower for the first 24 hours. You may shower after the first 24 hours.  Press the button if you feel a symptom. You will hear a small click. Record Date, Time and  Symptom in the Patient Logbook.  When you are ready to remove the patch, follow instructions on the last 2 pages of Patient  Logbook. Stick patch monitor onto the last page of Patient Logbook.  Place Patient Logbook in the blue and white box. Use locking tab on box and tape box closed  securely. The blue and white box  has prepaid postage on it. Please place it in the mailbox as  soon as possible. Your physician should have your test results approximately 7 days after the  monitor has been mailed back to [facility].  Call [facility] Customer Care at [phone] if you have questions regarding  your ZIO XT patch monitor. Call them immediately if you see an orange light blinking on your  monitor.  If your monitor falls off in less than 4 days, contact our Monitor department at [phone].  If your monitor becomes loose or  falls off after 4 days call [employer] at [phone] for  suggestions on securing your monitor    Follow-Up: At [facility], you and your health needs are our priority.  As part of our continuing mission to provide you with exceptional heart care, we have created designated Provider Care Teams.  These Care Teams include your primary Cardiologist (physician) and Advanced Practice Providers (APPs -  Physician Assistants and Nurse Practitioners) who all work together to provide you with the care you need, when you need it.  We recommend signing up for the patient portal called "MyChart".  Sign up information is provided on this After Visit Summary.  MyChart is used to connect with patients for Virtual Visits (Telemedicine).  Patients are able to view lab/test results, encounter notes, upcoming appointments, etc.  Non-urgent messages can be sent to your provider as well.   To learn more about what you can do with MyChart, go to [email or web address].    Your next appointment:   Keep Scheduled Appointment  Provider:   Marjie Skiff, PA-C

## 2023-01-29 ENCOUNTER — Ambulatory Visit: Payer: Medicare HMO | Admitting: Physician Assistant

## 2023-01-29 NOTE — Progress Notes (Incomplete)
Patient Care Team: Hurshel Party, NP as PCP - General (Internal Medicine) Loreli Slot, MD as Consulting Physician (Cardiothoracic Surgery) Antony Blackbird, MD as Consulting Physician (Radiation Oncology) Marcellus Scott, MD as Referring Physician (Pulmonary Disease) Dellia Beckwith, MD as Consulting Physician (Oncology)  Clinic Day: 01/30/23    I connected with Angela Baton. Phillips on 11/01/22 at  4:00 PM EST by telephone visit and verified that I am speaking with the correct person using two identifiers.  I discussed the limitations, risks, security and privacy concerns of performing an evaluation and management service by telemedicine and the availability of in-person appointments. I also discussed with the patient that there may be a patient responsible charge related to this service. The patient expressed understanding and agreed to proceed.  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 Bronchoscopy with biopsy at that time revealed atypical cells. She was deemed a poor surgical candidate due to multiple comorbidities including severe oxygen dependent COPD. She completed stereotactic radiation therapy to both lesions in late February 2022. Follow up imaging and office visit in July of 2022 with Dr. Roselind Messier revealed her to be stable with no suspicious areas and expected radiation changes. CT in January of 2023 revealed post treatment changes with no new or progressive findings. In August she had weight loss, fatigue and weakness, and CT 05/09/22 revealed continued  evolution of post-treatment changes in the right lower lobe and left upper lobe.  She has now had a new CT scan done on October 29, 2022 which reveals postradiation changes but no evidence of recurrent or metastatic disease.  She has a small pleural effusion.  She has a new mild compression of the T8 vertebral body.  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  She was started on Prolia injections on August 06, 2022 and tolerated this without any difficulty.  She is on calcium supplement 1200 mg daily and her calcium level is normal.  Plan She is stable in terms of her 2 small lung cancers. She has osteoporosis that was untreated so I arranged for Prolia injections every 6 months. I encouraged her to continue her B12 injections since she is feeling better.  She also knows to continue calcium supplement of 1200 mg daily.  Her annual screening mammogram was clear in November 2023. She will return in 3 months with CBC, CMP, CEA and we will plan her next Prolia injection at that time.  If all is well, we will wait 6 months to repeat her CT scan of the chest.  She understands and agrees with this plan of care and knows to call if she has further concerns regarding her lung cancers.  I provided 10 minutes of time during this this encounter and > 50% was spent counseling as documented under my assessment and plan.    [facility] AT [facility] [address] [place] [address] Dept: [phone]  Dept Fax: [phone]   No orders of the defined types were placed in this encounter.     CHIEF COMPLAINT:  CC: A 77 year old female with bilateral non-small cell lung cancers  Current Treatment:  Surveillance  INTERVAL HISTORY:  Angela Phillips is here today for repeat clinical assessment for her bilateral non-small cell lung cancers.   Patient states that she feels *** and ***.    She denies signs of infection such as sore throat, sinus drainage, cough, or urinary symptoms.  She denies fevers or recurrent chills. She denies pain. She denies nausea, vomiting, chest pain, dyspnea or cough. Her appetite is *** and her weight {Weight change:10426}.  These were treated with stereotactic radiation successfully.  Her latest CT scan from October 29, 2022 reveals postradiation changes but no evidence of recurrent or metastatic disease.  She has a small pleural effusion.  She also has a new compression deformity at the T8 vertebral body.  She has known osteoporosis with a bone density scan done July 12, 2022.  The T-score of the femur was -3.0 and the T-score of the forearm was even worse.  She received her first injection of Prolia at the end of November and tolerated it without any difficulty.  She is on calcium supplement 1200 mg daily and her calcium level is normal.  She tells me she had surgery on her right hand and just had therapy so her appetite is poor.  She had her testing done but did not feel up to coming in for her appointment and so we changed this to a telephone visit. She has a history of hip fracture and compression fracture, and then a fracture of the left elbow.  She had her annual mammogram July 12, 2022 and this was clear.  Her CBC, CMP and CEA are all normal. She denies signs of infection such as sore throat, sinus drainage, cough, or urinary symptoms.  She denies fevers or recurrent chills. She denies pain. She denies nausea, vomiting, chest pain, dyspnea or cough  I have reviewed the past medical history, past surgical history, social history and family history with the patient and they are unchanged from previous note.  ALLERGIES:  is allergic to aspirin, buspirone, celecoxib, chlorpheniramine, citalopram, ezetimibe, hydrocodone-acetaminophen, prochlorperazine, statins, and venlafaxine.  MEDICATIONS:  Current  Outpatient Medications  Medication Sig Dispense Refill   albuterol (PROVENTIL) (2.5 MG/3ML) 0.083% nebulizer solution Take 2.5 mg by nebulization every 6 (six) hours as needed for wheezing or shortness of breath.     albuterol (VENTOLIN HFA) 108 (90 Base) MCG/ACT inhaler Inhale 2 puffs into the lungs every 6 (six) hours as needed for wheezing or shortness of breath.     celecoxib (CELEBREX) 200 MG capsule Take 200 mg by mouth daily.     clonazePAM (KLONOPIN) 1 MG tablet Take 1 mg by mouth every 6 (six) hours as needed for anxiety.     diclofenac (VOLTAREN) 75 MG EC tablet Take 75 mg by mouth 2 (two) times daily.     diphenoxylate-atropine (LOMOTIL) 2.5-0.025 MG tablet Take 1 tablet by mouth as needed.     FLUoxetine (PROZAC) 20 MG capsule Take 20 mg by mouth 2 (two) times daily.     gabapentin (NEURONTIN) 300 MG capsule Take 300 mg by mouth at bedtime.     levalbuterol (XOPENEX) 0.63 MG/3ML nebulizer solution SMARTSIG:1 Via Inhaler     losartan-hydrochlorothiazide (HYZAAR) 50-12.5 MG tablet Take 1 tablet by mouth daily.     meloxicam (MOBIC) 7.5 MG  tablet Take 7.5 mg by mouth 2 (two) times daily.     methocarbamol (ROBAXIN) 500 MG tablet Take 500-1,000 mg by mouth every 6 (six) hours as needed.     mirtazapine (REMERON) 7.5 MG tablet Take 7.5 mg by mouth at bedtime. (Patient not taking: Reported on 01/28/2023)     montelukast (SINGULAIR) 10 MG tablet Take 1 tablet by mouth at bedtime. (Patient not taking: Reported on 01/28/2023)     Multiple Vitamin (MULTI-VITAMINS) TABS Take 1 tablet by mouth daily. Centrum silver     omeprazole (PRILOSEC) 40 MG capsule Take 40 mg by mouth daily.     ondansetron (ZOFRAN) 4 MG tablet Take 4 mg by mouth every 4 (four) hours as needed.     predniSONE (DELTASONE) 10 MG tablet Take 10 mg by mouth daily. (Patient not taking: Reported on 01/28/2023)     Probiotic Product (PROBIOTIC ADVANCED PO) Take by mouth. 60 billion (Patient not taking: Reported on 01/28/2023)      simvastatin (ZOCOR) 40 MG tablet Take 20 mg by mouth at bedtime.     SYMBICORT 160-4.5 MCG/ACT inhaler Inhale 2 puffs into the lungs 2 (two) times daily.     traMADol (ULTRAM) 50 MG tablet Take by mouth.     No current facility-administered medications for this visit.    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)   10/11/2020 Cancer Staging   Staging form: Lung, AJCC 8th Edition - Clinical stage from 10/11/2020: Stage IA2 (cT1b, cN0, cM0) - Signed by Dellia Beckwith, MD on 06/10/2021 Histopathologic type: Adenocarcinoma, NOS Stage prefix: Initial diagnosis Histologic grade (G): GX Histologic grading system: 4 grade system Laterality: Left Tumor size (mm): 16 Lymph-vascular invasion (LVI): Presence of LVI unknown/indeterminate Diagnostic confirmation: Radiography and other imaging techniques without microscopic confirmation Staged by: Managing physician Stage used in treatment planning: Yes National guidelines used in treatment planning: Yes Type of national guideline used in treatment planning: NCCN Staging comments: SBRT   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)  10/11/2020 Initial Diagnosis   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)   10/11/2020 Cancer Staging   Staging form: Lung, AJCC 8th Edition - Clinical stage from 10/11/2020: Stage IA3 (cT1c, cN0, cM0) - Signed by Dellia Beckwith, MD on 06/10/2021 Stage prefix: Initial diagnosis Histologic grade (G): GX Histologic grading system: 4 grade system Laterality: Right Tumor size (mm): 22 Lymph-vascular invasion (LVI): Presence of LVI unknown/indeterminate Diagnostic confirmation: Radiography and other imaging techniques without microscopic confirmation Staged by: Managing physician Stage used in treatment planning: Yes National guidelines used in treatment planning: Yes Type of national guideline used in  treatment planning: NCCN       REVIEW OF SYSTEMS:  Review of Systems  Constitutional: Negative.  Negative for appetite change, chills, diaphoresis, fatigue, fever and unexpected weight change.  HENT:  Negative.  Negative for hearing loss, lump/mass, mouth sores, nosebleeds, sore throat, tinnitus, trouble swallowing and voice change.   Eyes: Negative.  Negative for eye problems and icterus.  Respiratory: Negative.  Negative for chest tightness, cough, hemoptysis, shortness of breath and wheezing.   Cardiovascular: Negative.  Negative for chest pain, leg swelling and palpitations.  Gastrointestinal: Negative.  Negative for abdominal distention, abdominal pain, blood in stool, constipation, diarrhea, nausea, rectal pain and vomiting.  Endocrine: Negative.   Genitourinary: Negative.  Negative for bladder  incontinence, difficulty urinating, dyspareunia, dysuria, frequency, hematuria, menstrual problem, nocturia, pelvic pain, vaginal bleeding and vaginal discharge.   Musculoskeletal: Negative.  Negative for arthralgias, back pain, flank pain, gait problem, myalgias, neck pain and neck stiffness.  Skin: Negative.  Negative for itching, rash and wound.  Neurological:  Negative for dizziness, extremity weakness, gait problem, headaches, light-headedness, numbness, seizures and speech difficulty.  Hematological: Negative.  Negative for adenopathy. Does not bruise/bleed easily.  Psychiatric/Behavioral: Negative.  Negative for confusion, decreased concentration, depression, sleep disturbance and suicidal ideas. The patient is not nervous/anxious.    VITALS:  There were no vitals taken for this visit.  Wt Readings from Last 3 Encounters:  01/28/23 116 lb 12.8 oz (53 kg)  01/10/23 119 lb (54 kg)  08/06/22 107 lb 9.6 oz (48.8 kg)    There is no height or weight on file to calculate BMI.  Performance status (ECOG): 1 - Symptomatic but completely ambulatory  PHYSICAL EXAM:   A physical exam was not  done since this was a telephone visit.  Physical Exam Vitals and nursing note reviewed.  Constitutional:      General: She is not in acute distress.    Appearance: Normal appearance. She is normal weight. She is not ill-appearing, toxic-appearing or diaphoretic.  HENT:     Head: Normocephalic and atraumatic.     Right Ear: Tympanic membrane, ear canal and external ear normal. There is no impacted cerumen.     Left Ear: Tympanic membrane, ear canal and external ear normal. There is no impacted cerumen.     Nose: Nose normal. No congestion or rhinorrhea.     Mouth/Throat:     Mouth: Mucous membranes are moist.     Pharynx: Oropharynx is clear. No oropharyngeal exudate or posterior oropharyngeal erythema.  Eyes:     General: No scleral icterus.       Left eye: No discharge.     Extraocular Movements: Extraocular movements intact.     Conjunctiva/sclera: Conjunctivae normal.     Pupils: Pupils are equal, round, and reactive to light.  Neck:     Vascular: No carotid bruit.  Cardiovascular:     Rate and Rhythm: Normal rate and regular rhythm.     Pulses: Normal pulses.     Heart sounds: Normal heart sounds. No murmur heard.    No friction rub. No gallop.  Pulmonary:     Effort: Pulmonary effort is normal. No respiratory distress.     Breath sounds: Normal breath sounds. No stridor. No wheezing, rhonchi or rales.  Chest:     Chest wall: No tenderness.  Abdominal:     General: Bowel sounds are normal. There is no distension.     Palpations: Abdomen is soft. There is no hepatomegaly, splenomegaly or mass.     Tenderness: There is no abdominal tenderness. There is no right CVA tenderness, left CVA tenderness, guarding or rebound.     Hernia: No hernia is present.  Musculoskeletal:        General: No swelling, tenderness, deformity or signs of injury. Normal range of motion.     Cervical back: Normal range of motion and neck supple. No rigidity or tenderness.     Right lower leg: No  edema.     Left lower leg: No edema.  Lymphadenopathy:     Cervical: No cervical adenopathy.     Right cervical: No superficial, deep or posterior cervical adenopathy.    Left cervical: No superficial, deep or posterior cervical adenopathy.  Upper Body:     Right upper body: No supraclavicular, axillary or pectoral adenopathy.     Left upper body: No supraclavicular, axillary or pectoral adenopathy.  Skin:    General: Skin is warm and dry.     Coloration: Skin is not jaundiced or pale.     Findings: No bruising, erythema, lesion or rash.  Neurological:     General: No focal deficit present.     Mental Status: She is alert and oriented to person, place, and time. Mental status is at baseline.     Cranial Nerves: No cranial nerve deficit.     Sensory: No sensory deficit.     Motor: No weakness.     Coordination: Coordination normal.     Gait: Gait normal.     Deep Tendon Reflexes: Reflexes normal.  Psychiatric:        Mood and Affect: Mood normal.        Behavior: Behavior normal.        Thought Content: Thought content normal.        Judgment: Judgment normal.   LABORATORY DATA:  I have reviewed the data as listed  Component Ref Range & Units 01/10/23  Magnesium 1.7 - 2.4 mg/dL 1.9     Component Ref Range & Units 2 wk ago (01/10/23) 2 wk ago (01/10/23)  Troponin I (High Sensitivity) <18 ng/L 20 High  21 High  CM        Component Value Date/Time   NA 138 01/10/2023 1558   NA 137 10/29/2022 0000   K 4.1 01/10/2023 1558   CL 103 01/10/2023 1558   CO2 26 01/10/2023 1558   GLUCOSE 74 01/10/2023 1558   BUN 13 01/10/2023 1558   BUN 15 10/29/2022 0000   CREATININE 0.95 01/10/2023 1558   CALCIUM 8.8 (L) 01/10/2023 1558   PROT 6.5 08/23/2020 1022   PROT 6.3 08/02/2020 1233   ALBUMIN 4.3 10/29/2022 0000   ALBUMIN 4.1 08/02/2020 1233   AST 28 10/29/2022 0000   ALT 18 10/29/2022 0000   ALKPHOS 61 10/29/2022 0000   BILITOT 0.8 08/23/2020 1022   BILITOT 0.3 08/02/2020  1233   GFRNONAA >60 01/10/2023 1558   GFRAA 93 08/02/2020 1233   No results found for: "SPEP", "UPEP"  Lab Results  Component Value Date   WBC 10.3 01/10/2023   NEUTROABS 4.90 10/29/2022   HGB 13.3 01/10/2023   HCT 42.3 01/10/2023   MCV 89.6 01/10/2023   PLT 329 01/10/2023     Chemistry      Component Value Date/Time   NA 138 01/10/2023 1558   NA 137 10/29/2022 0000   K 4.1 01/10/2023 1558   CL 103 01/10/2023 1558   CO2 26 01/10/2023 1558   BUN 13 01/10/2023 1558   BUN 15 10/29/2022 0000   CREATININE 0.95 01/10/2023 1558   GLU 86 10/29/2022 0000      Component Value Date/Time   CALCIUM 8.8 (L) 01/10/2023 1558   ALKPHOS 61 10/29/2022 0000   AST 28 10/29/2022 0000   ALT 18 10/29/2022 0000   BILITOT 0.8 08/23/2020 1022   BILITOT 0.3 08/02/2020 1233     RADIOGRAPHIC STUDIES: I have personally reviewed the radiological images as listed and agreed with the findings in the report.        I,Jasmine M Lassiter,acting as a scribe for Dellia Beckwith, MD.,have documented all relevant documentation on the behalf of Dellia Beckwith, MD,as directed by  Dellia Beckwith, MD while  in the presence of Dellia Beckwith, MD.

## 2023-01-30 ENCOUNTER — Telehealth: Payer: Self-pay | Admitting: Oncology

## 2023-01-30 ENCOUNTER — Inpatient Hospital Stay: Payer: Medicare HMO

## 2023-01-30 ENCOUNTER — Inpatient Hospital Stay: Payer: Medicare HMO | Admitting: Oncology

## 2023-01-30 DIAGNOSIS — I471 Supraventricular tachycardia, unspecified: Secondary | ICD-10-CM | POA: Diagnosis not present

## 2023-01-30 DIAGNOSIS — I10 Essential (primary) hypertension: Secondary | ICD-10-CM

## 2023-01-30 DIAGNOSIS — E785 Hyperlipidemia, unspecified: Secondary | ICD-10-CM

## 2023-01-30 DIAGNOSIS — I2584 Coronary atherosclerosis due to calcified coronary lesion: Secondary | ICD-10-CM

## 2023-01-30 DIAGNOSIS — I2510 Atherosclerotic heart disease of native coronary artery without angina pectoris: Secondary | ICD-10-CM

## 2023-01-30 DIAGNOSIS — R002 Palpitations: Secondary | ICD-10-CM

## 2023-01-30 NOTE — Telephone Encounter (Signed)
01/30/23  Patient did not realize her Appt was for today labs, follow up with Dr Gilman Buttner.  Transferred to [facility] to reschedule

## 2023-01-31 ENCOUNTER — Telehealth: Payer: Self-pay | Admitting: *Deleted

## 2023-01-31 NOTE — Telephone Encounter (Signed)
Katy from monitors brought an email from [place] about pt monitor not transmitting -- they addressed this with her and tried to troubleshoot. Monitor is working/transmitting -  just not today. They advised her to go to an area w/ good cell service at least once a day.  She reported the following to [facility] when they called --  she told them she feels like her heart is beating in her throat, feeling faint, SOB, and like she is going to pass out.  They informed her to call 911 if she needs to. Pt reports that she is currently without symptoms. Advised to go to ED for evaluation if symptoms return, as we currently cannot see if heart rhythm is related or not. Patient verbalized understanding and agreeable to plan.

## 2023-02-01 DIAGNOSIS — I2510 Atherosclerotic heart disease of native coronary artery without angina pectoris: Secondary | ICD-10-CM | POA: Diagnosis not present

## 2023-02-03 ENCOUNTER — Encounter (HOSPITAL_COMMUNITY): Payer: Self-pay

## 2023-02-03 DIAGNOSIS — R002 Palpitations: Secondary | ICD-10-CM | POA: Diagnosis not present

## 2023-02-03 DIAGNOSIS — I4891 Unspecified atrial fibrillation: Secondary | ICD-10-CM | POA: Diagnosis not present

## 2023-02-03 DIAGNOSIS — I4510 Unspecified right bundle-branch block: Secondary | ICD-10-CM | POA: Diagnosis not present

## 2023-02-03 DIAGNOSIS — R42 Dizziness and giddiness: Secondary | ICD-10-CM | POA: Diagnosis not present

## 2023-02-03 DIAGNOSIS — I491 Atrial premature depolarization: Secondary | ICD-10-CM | POA: Diagnosis not present

## 2023-02-03 DIAGNOSIS — R4589 Other symptoms and signs involving emotional state: Secondary | ICD-10-CM | POA: Diagnosis not present

## 2023-02-03 DIAGNOSIS — J9811 Atelectasis: Secondary | ICD-10-CM | POA: Diagnosis not present

## 2023-02-03 DIAGNOSIS — R9431 Abnormal electrocardiogram [ECG] [EKG]: Secondary | ICD-10-CM | POA: Diagnosis not present

## 2023-02-03 DIAGNOSIS — F419 Anxiety disorder, unspecified: Secondary | ICD-10-CM | POA: Diagnosis not present

## 2023-02-03 DIAGNOSIS — R079 Chest pain, unspecified: Secondary | ICD-10-CM | POA: Diagnosis not present

## 2023-02-07 ENCOUNTER — Other Ambulatory Visit: Payer: Medicare HMO

## 2023-02-07 ENCOUNTER — Telehealth: Payer: Self-pay

## 2023-02-07 ENCOUNTER — Ambulatory Visit: Payer: Medicare HMO | Admitting: Oncology

## 2023-02-07 NOTE — Telephone Encounter (Signed)
Transition Care Management Unsuccessful Follow-up Telephone Call  Date of discharge and from where:  [facility] 5/27  Attempts:  1st Attempt  Reason for unsuccessful TCM follow-up call:  Left voice message   Lenard Forth [facility] Guide, [facility] [phone] [address] [place], [place], [place] [address] Phone: [phone] Email: [email or web address]

## 2023-02-11 ENCOUNTER — Telehealth: Payer: Self-pay

## 2023-02-11 NOTE — Telephone Encounter (Signed)
Transition Care Management Follow-up Telephone Call Date of discharge and from where: [facility] 5/27 How have you been since you were released from the hospital? Better  Any questions or concerns? No  Items Reviewed: Did the pt receive and understand the discharge instructions provided? Yes  Medications obtained and verified? Yes  Other? No  Any new allergies since your discharge? No  Dietary orders reviewed? No Do you have support at home? Yes    Follow up appointments reviewed:  PCP Hospital f/u appt confirmed? No  Scheduled to see  on  @ . Specialist Hospital f/u appt confirmed? Yes  Scheduled to see Cardiology on 6/12 @ . Are transportation arrangements needed? No  If their condition worsens, is the pt aware to call PCP or go to the Emergency Dept.? Yes Was the patient provided with contact information for the PCP's office or ED? Yes Was to pt encouraged to call back with questions or concerns? Yes

## 2023-02-12 DIAGNOSIS — J45909 Unspecified asthma, uncomplicated: Secondary | ICD-10-CM | POA: Diagnosis not present

## 2023-02-17 ENCOUNTER — Ambulatory Visit: Payer: Medicare HMO | Admitting: Student

## 2023-02-18 ENCOUNTER — Encounter: Payer: Self-pay | Admitting: Oncology

## 2023-02-19 ENCOUNTER — Encounter: Payer: Self-pay | Admitting: Oncology

## 2023-02-19 ENCOUNTER — Ambulatory Visit: Payer: Medicare HMO | Attending: Student

## 2023-02-19 DIAGNOSIS — R42 Dizziness and giddiness: Secondary | ICD-10-CM

## 2023-02-19 DIAGNOSIS — R079 Chest pain, unspecified: Secondary | ICD-10-CM | POA: Diagnosis not present

## 2023-02-19 DIAGNOSIS — R55 Syncope and collapse: Secondary | ICD-10-CM

## 2023-02-19 DIAGNOSIS — R002 Palpitations: Secondary | ICD-10-CM | POA: Diagnosis not present

## 2023-02-19 LAB — ECHOCARDIOGRAM COMPLETE: S' Lateral: 2.4 cm

## 2023-02-21 ENCOUNTER — Telehealth: Payer: Self-pay | Admitting: Cardiovascular Disease

## 2023-02-21 NOTE — Telephone Encounter (Signed)
Patient wants call back to discuss test results. 

## 2023-02-21 NOTE — Telephone Encounter (Signed)
Spoke with patient and gave information.  She ask about her pulse beating in her neck.  Advised when she feels this, to check her BP and pulse and write it down  to see if there is any pattern or changes. She may try

## 2023-02-24 ENCOUNTER — Telehealth: Payer: Self-pay | Admitting: Cardiovascular Disease

## 2023-02-24 NOTE — Telephone Encounter (Signed)
Pt returning nurses call regarding results. Please advise 

## 2023-02-24 NOTE — Telephone Encounter (Signed)
Results of echo given to paitient  She cancelled F/U on 6/10.  She ask does she need to be seen (OV stated 2-3 weeks) as we gave her her results of her test.  She ask to let her know when the follow up is needed, she ask to be seen by her primary doctor for next visit

## 2023-02-25 NOTE — Telephone Encounter (Signed)
Monitor showed no significant arrhythmias (there was a single 11 beat run of SVT and rare premature beats but triggered events did not correlate to any arrhythmias). Given reassuring Echo and monitor results, I think it is okay if she just follows up with Dr. Royann Shivers in 4-6 months as long as she is feeling okay.  Thank! Marlee Armenteros

## 2023-02-26 NOTE — Telephone Encounter (Signed)
Returned call to pt, notified of result. Tried to make appt but pt declined 1st available and APP (as she wants to see MD only-discuss these results) informed of ER precautions. (She states that she "will not be going there either") she will call back with "what she decides".

## 2023-02-27 ENCOUNTER — Telehealth: Payer: Self-pay | Admitting: Cardiovascular Disease

## 2023-02-27 NOTE — Telephone Encounter (Signed)
Patient would like to switch to Dr Tomie China since he is in [place], where she lives. Please advise

## 2023-02-27 NOTE — Telephone Encounter (Signed)
No objections

## 2023-03-04 DIAGNOSIS — J4550 Severe persistent asthma, uncomplicated: Secondary | ICD-10-CM | POA: Diagnosis not present

## 2023-03-04 DIAGNOSIS — R5383 Other fatigue: Secondary | ICD-10-CM | POA: Diagnosis not present

## 2023-03-04 DIAGNOSIS — R918 Other nonspecific abnormal finding of lung field: Secondary | ICD-10-CM | POA: Diagnosis not present

## 2023-03-06 NOTE — Progress Notes (Incomplete)
Patient Care Team: Angela Party, NP as PCP - General (Internal Medicine) Angela Slot, MD as Consulting Physician (Cardiothoracic Surgery) Angela Blackbird, MD as Consulting Physician (Radiation Oncology) Angela Scott, MD as Referring Physician (Pulmonary Disease) Angela Beckwith, MD as Consulting Physician (Oncology)  Clinic Day: 03/06/23   I connected with Angela Phillips on 11/01/22 at  4:00 PM EST by telephone visit and verified that I am speaking with the correct person using two identifiers.  I discussed the limitations, risks, security and privacy concerns of performing an evaluation and management service by telemedicine and the availability of in-person appointments. I also discussed with the patient that there may be a patient responsible charge related to this service. The patient expressed understanding and agreed to proceed.  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 Bronchoscopy with biopsy at that time revealed atypical cells. She was deemed a poor surgical candidate due to multiple comorbidities including severe oxygen dependent COPD. She completed stereotactic radiation therapy to both lesions in late February 2022. Follow up imaging and office visit in July of 2022 with Angela Phillips revealed her to be stable with no suspicious areas and expected radiation changes. CT in January of 2023 revealed post treatment changes with no new or progressive findings. In August she had weight loss, fatigue and weakness, and CT 05/09/22 revealed continued evolution  of post-treatment changes in the right lower lobe and left upper lobe.  She has now had a new CT scan done on October 29, 2022 which reveals postradiation changes but no evidence of recurrent or metastatic disease.  She has a small pleural effusion.  She has a new mild compression of the T8 vertebral body.  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  She was started on Prolia injections on August 06, 2022 and tolerated this without any difficulty.  She is on calcium supplement 1200 mg daily and her calcium level is normal.  Plan She is stable in terms of her 2 small lung cancers. She has osteoporosis that was untreated so I arranged for Prolia injections every 6 months. I encouraged her to continue her B12 injections since she is feeling better.  She also knows to continue calcium supplement of 1200 mg daily.  Her annual screening mammogram was clear in November 2023. She will return in 3 months with CBC, CMP, CEA and we will plan her next Prolia injection at that time.  If all is well, we will wait 6 months to repeat her CT scan of the chest.  She understands and agrees with this plan of care and knows to call if she has further concerns regarding her lung cancers.  I provided 10 minutes of time during this this encounter and > 50% was spent counseling as documented under my assessment and plan.     CANCER Angela Phillips AT [facility] [address] [place] [address] Dept:  [phone] Dept Fax: [phone]   No orders of the defined types were placed in this encounter.    CHIEF COMPLAINT:  CC: A 77 year old female with bilateral non-small cell lung cancers  Current Treatment:  Surveillance  INTERVAL HISTORY:  [name] is here today for repeat clinical assessment for her bilateral non-small cell lung cancers.  These were  treated with stereotactic radiation successfully.  Her latest CT scan from October 29, 2022 reveals postradiation changes but no evidence of recurrent or metastatic disease.  She has a small pleural effusion.  Patient states that she feels *** and ***.     She denies signs of infection such as sore throat, sinus drainage, cough, or urinary symptoms.  She denies fevers or recurrent chills. She denies pain. She denies nausea, vomiting, chest pain, dyspnea or cough. Her appetite is *** and her weight {Weight change:10426}.  She also has a new compression deformity at the T8 vertebral body.  She has known osteoporosis with a bone density scan done July 12, 2022.  The T-score of the femur was -3.0 and the T-score of the forearm was even worse.  She received her first injection of Prolia at the end of November and tolerated it without any difficulty.  She is on calcium supplement 1200 mg daily and her calcium level is normal.  She tells me she had surgery on her right hand and just had therapy so her appetite is poor.  She had her testing done but did not feel up to coming in for her appointment and so we changed this to a telephone visit. She has a history of hip fracture and compression fracture, and then a fracture of the left elbow.  She had her annual mammogram July 12, 2022 and this was clear.  Her CBC, CMP and CEA are all normal. She denies signs of infection such as sore throat, sinus drainage, cough, or urinary symptoms.  She denies fevers or recurrent chills. She denies pain. She denies nausea, vomiting, chest pain, dyspnea or cough  I have reviewed the past medical history, past surgical history, social history and family history with the patient and they are unchanged from previous note.  ALLERGIES:  is allergic to aspirin, buspirone, celecoxib, chlorpheniramine, citalopram, ezetimibe, hydrocodone-acetaminophen, prochlorperazine, statins, and venlafaxine.  MEDICATIONS:  Current Outpatient  Medications  Medication Sig Dispense Refill   albuterol (PROVENTIL) (2.5 MG/3ML) 0.083% nebulizer solution Take 2.5 mg by nebulization every 6 (six) hours as needed for wheezing or shortness of breath.     albuterol (VENTOLIN HFA) 108 (90 Base) MCG/ACT inhaler Inhale 2 puffs into the lungs every 6 (six) hours as needed for wheezing or shortness of breath.     celecoxib (CELEBREX) 200 MG capsule Take 200 mg by mouth daily.     clonazePAM (KLONOPIN) 1 MG tablet Take 1 mg by mouth every 6 (six) hours as needed for anxiety.     diclofenac (VOLTAREN) 75 MG EC tablet Take 75 mg by mouth 2 (two) times daily.     diphenoxylate-atropine (LOMOTIL) 2.5-0.025 MG tablet Take 1 tablet by mouth as needed.     FLUoxetine (PROZAC) 20 MG capsule Take 20 mg by mouth 2 (two) times daily.     gabapentin (NEURONTIN) 300 MG capsule Take 300 mg by mouth at bedtime.     levalbuterol (XOPENEX) 0.63 MG/3ML nebulizer solution SMARTSIG:1 Via Inhaler     losartan-hydrochlorothiazide (HYZAAR) 50-12.5 MG tablet Take 1 tablet by mouth daily.     meloxicam (MOBIC) 7.5  MG tablet Take 7.5 mg by mouth 2 (two) times daily.     methocarbamol (ROBAXIN) 500 MG tablet Take 500-1,000 mg by mouth every 6 (six) hours as needed.     mirtazapine (REMERON) 7.5 MG tablet Take 7.5 mg by mouth at bedtime. (Patient not taking: Reported on 01/28/2023)     montelukast (SINGULAIR) 10 MG tablet Take 1 tablet by mouth at bedtime. (Patient not taking: Reported on 01/28/2023)     Multiple Vitamin (MULTI-VITAMINS) TABS Take 1 tablet by mouth daily. Centrum silver     omeprazole (PRILOSEC) 40 MG capsule Take 40 mg by mouth daily.     ondansetron (ZOFRAN) 4 MG tablet Take 4 mg by mouth every 4 (four) hours as needed.     predniSONE (DELTASONE) 10 MG tablet Take 10 mg by mouth daily. (Patient not taking: Reported on 01/28/2023)     Probiotic Product (PROBIOTIC ADVANCED PO) Take by mouth. 60 billion (Patient not taking: Reported on 01/28/2023)     simvastatin  (ZOCOR) 40 MG tablet Take 20 mg by mouth at bedtime.     SYMBICORT 160-4.5 MCG/ACT inhaler Inhale 2 puffs into the lungs 2 (two) times daily.     traMADol (ULTRAM) 50 MG tablet Take by mouth.     No current facility-administered medications for this visit.    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)   10/11/2020 Cancer Staging   Staging form: Lung, AJCC 8th Edition - Clinical stage from 10/11/2020: Stage IA2 (cT1b, cN0, cM0) - Signed by Angela Beckwith, MD on 06/10/2021 Histopathologic type: Adenocarcinoma, NOS Stage prefix: Initial diagnosis Histologic grade (G): GX Histologic grading system: 4 grade system Laterality: Left Tumor size (mm): 16 Lymph-vascular invasion (LVI): Presence of LVI unknown/indeterminate Diagnostic confirmation: Radiography and other imaging techniques without microscopic confirmation Staged by: Managing physician Stage used in treatment planning: Yes National guidelines used in treatment planning: Yes Type of national guideline used in treatment planning: NCCN Staging comments: SBRT   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)  10/11/2020 Initial Diagnosis   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)   10/11/2020 Cancer Staging   Staging form: Lung, AJCC 8th Edition - Clinical stage from 10/11/2020: Stage IA3 (cT1c, cN0, cM0) - Signed by Angela Beckwith, MD on 06/10/2021 Stage prefix: Initial diagnosis Histologic grade (G): GX Histologic grading system: 4 grade system Laterality: Right Tumor size (mm): 22 Lymph-vascular invasion (LVI): Presence of LVI unknown/indeterminate Diagnostic confirmation: Radiography and other imaging techniques without microscopic confirmation Staged by: Managing physician Stage used in treatment planning: Yes National guidelines used in treatment planning: Yes Type of national guideline used in treatment  planning: NCCN       REVIEW OF SYSTEMS:  Review of Systems  Constitutional: Negative.  Negative for appetite change, chills, diaphoresis, fatigue, fever and unexpected weight change.  HENT:  Negative.  Negative for hearing loss, lump/mass, mouth sores, nosebleeds, sore throat, tinnitus, trouble swallowing and voice change.   Eyes: Negative.  Negative for eye problems and icterus.  Respiratory: Negative.  Negative for chest tightness, cough, hemoptysis, shortness of breath and wheezing.   Cardiovascular: Negative.  Negative for chest pain, leg swelling and palpitations.  Gastrointestinal: Negative.  Negative for abdominal distention, abdominal pain, blood in stool, constipation, diarrhea, nausea, rectal pain and vomiting.  Endocrine: Negative.   Genitourinary: Negative.  Negative for  bladder incontinence, difficulty urinating, dyspareunia, dysuria, frequency, hematuria, menstrual problem, nocturia, pelvic pain, vaginal bleeding and vaginal discharge.   Musculoskeletal: Negative.  Negative for arthralgias, back pain, flank pain, gait problem, myalgias, neck pain and neck stiffness.  Skin: Negative.  Negative for itching, rash and wound.  Neurological:  Negative for dizziness, extremity weakness, gait problem, headaches, light-headedness, numbness, seizures and speech difficulty.  Hematological: Negative.  Negative for adenopathy. Does not bruise/bleed easily.  Psychiatric/Behavioral: Negative.  Negative for confusion, decreased concentration, depression, sleep disturbance and suicidal ideas. The patient is not nervous/anxious.    VITALS:  There were no vitals taken for this visit.  Wt Readings from Last 3 Encounters:  01/28/23 116 lb 12.8 oz (53 kg)  01/10/23 119 lb (54 kg)  08/06/22 107 lb 9.6 oz (48.8 kg)    There is no height or weight on file to calculate BMI.  Performance status (ECOG): 1 - Symptomatic but completely ambulatory  PHYSICAL EXAM:   A physical exam was not done since  this was a telephone visit.  Physical Exam Vitals and nursing note reviewed.  Constitutional:      General: She is not in acute distress.    Appearance: Normal appearance. She is normal weight. She is not ill-appearing, toxic-appearing or diaphoretic.  HENT:     Head: Normocephalic and atraumatic.     Right Ear: Tympanic membrane, ear canal and external ear normal. There is no impacted cerumen.     Left Ear: Tympanic membrane, ear canal and external ear normal. There is no impacted cerumen.     Nose: Nose normal. No congestion or rhinorrhea.     Mouth/Throat:     Mouth: Mucous membranes are moist.     Pharynx: Oropharynx is clear. No oropharyngeal exudate or posterior oropharyngeal erythema.  Eyes:     General: No scleral icterus.       Right eye: No discharge.        Left eye: No discharge.     Extraocular Movements: Extraocular movements intact.     Conjunctiva/sclera: Conjunctivae normal.     Pupils: Pupils are equal, round, and reactive to light.  Neck:     Vascular: No carotid bruit.  Cardiovascular:     Rate and Rhythm: Normal rate and regular rhythm.     Pulses: Normal pulses.     Heart sounds: Normal heart sounds. No murmur heard.    No friction rub. No gallop.  Pulmonary:     Effort: Pulmonary effort is normal. No respiratory distress.     Breath sounds: Normal breath sounds. No stridor. No wheezing, rhonchi or rales.  Chest:     Chest wall: No tenderness.  Abdominal:     General: Bowel sounds are normal. There is no distension.     Palpations: Abdomen is soft. There is no hepatomegaly, splenomegaly or mass.     Tenderness: There is no abdominal tenderness. There is no right CVA tenderness, left CVA tenderness, guarding or rebound.     Hernia: No hernia is present.  Musculoskeletal:        General: No swelling, tenderness, deformity or signs of injury. Normal range of motion.     Cervical back: Normal range of motion and neck supple. No rigidity or tenderness.      Right lower leg: No edema.     Left lower leg: No edema.  Lymphadenopathy:     Cervical: No cervical adenopathy.     Right cervical: No superficial, deep or posterior cervical adenopathy.  Left cervical: No superficial, deep or posterior cervical adenopathy.     Upper Body:     Right upper body: No supraclavicular, axillary or pectoral adenopathy.     Left upper body: No supraclavicular, axillary or pectoral adenopathy.  Skin:    General: Skin is warm and dry.     Coloration: Skin is not jaundiced or pale.     Findings: No bruising, erythema, lesion or rash.  Neurological:     General: No focal deficit present.     Mental Status: She is alert and oriented to person, place, and time. Mental status is at baseline.     Cranial Nerves: No cranial nerve deficit.     Sensory: No sensory deficit.     Motor: No weakness.     Coordination: Coordination normal.     Gait: Gait normal.     Deep Tendon Reflexes: Reflexes normal.  Psychiatric:        Mood and Affect: Mood normal.        Behavior: Behavior normal.        Thought Content: Thought content normal.        Judgment: Judgment normal.   LABORATORY DATA:  I have reviewed the data as listed  CBC Ref Range & Units (01/10/23)    (10/29/22) 3 mo ago (07/12/22) 6 mo ago (04/11/22) 9 mo ago (01/09/22) 1 yr ago (09/21/21) 1 yr ago (07/23/21) 1 yr ago (06/06/21)  Hemoglobin 12.0 - 16.0 13.3  13.5 13.5 13.3 13.2 13.6 15.1 13.9  HCT 36 - 46 42.3  41 41 42 41 39 46 43  Neutrophils Absolute   4.90 7.17 3.50 7.85 6.16 3.94 9.32  Platelets 150 - 400 K/uL 329  292 338 282 472 Abnormal  255 R 256 R 338 R  WBC  10.3    8.3 10.7 7.0 10.9 8.8 8.2 11.1     Component 10/29/2022  CEA 3.1      Component Value Date/Time   NA 138 01/10/2023 1558   NA 137 10/29/2022 0000   K 4.1 01/10/2023 1558   CL 103 01/10/2023 1558   CO2 26 01/10/2023 1558   GLUCOSE 74 01/10/2023 1558   BUN 13 01/10/2023 1558   BUN 15 10/29/2022 0000   CREATININE 0.95  01/10/2023 1558   CALCIUM 8.8 (L) 01/10/2023 1558   PROT 6.5 08/23/2020 1022   PROT 6.3 08/02/2020 1233   ALBUMIN 4.3 10/29/2022 0000   ALBUMIN 4.1 08/02/2020 1233   AST 28 10/29/2022 0000   ALT 18 10/29/2022 0000   ALKPHOS 61 10/29/2022 0000   BILITOT 0.8 08/23/2020 1022   BILITOT 0.3 08/02/2020 1233   GFRNONAA >60 01/10/2023 1558   GFRAA 93 08/02/2020 1233   No results found for: "SPEP", "UPEP"  Lab Results  Component Value Date   WBC 10.3 01/10/2023   NEUTROABS 4.90 10/29/2022   HGB 13.3 01/10/2023   HCT 42.3 01/10/2023   MCV 89.6 01/10/2023   PLT 329 01/10/2023     Chemistry      Component Value Date/Time   NA 138 01/10/2023 1558   NA 137 10/29/2022 0000   K 4.1 01/10/2023 1558   CL 103 01/10/2023 1558   CO2 26 01/10/2023 1558   BUN 13 01/10/2023 1558   BUN 15 10/29/2022 0000   CREATININE 0.95 01/10/2023 1558   GLU 86 10/29/2022 0000      Component Value Date/Time   CALCIUM 8.8 (L) 01/10/2023 1558   ALKPHOS 61 10/29/2022 0000  AST 28 10/29/2022 0000   ALT 18 10/29/2022 0000   BILITOT 0.8 08/23/2020 1022   BILITOT 0.3 08/02/2020 1233     Component Ref Range & Units 01/10/23  Magnesium 1.7 - 2.4 mg/dL 1.9         Component Ref Range & Units 1 mo ago (01/10/23) 1 mo ago (01/10/23)  Troponin I (High Sensitivity) <18 ng/L 20 High  21 High  CM     Component 4 mo ago  CEA 3.1      RADIOGRAPHIC STUDIES: I have personally reviewed the radiological images as listed and agreed with the findings in the report.      I,Jasmine M Lassiter,acting as a scribe for Angela Beckwith, MD.,have documented all relevant documentation on the behalf of Angela Beckwith, MD,as directed by  Angela Beckwith, MD while in the presence of Angela Beckwith, MD.

## 2023-03-07 ENCOUNTER — Inpatient Hospital Stay: Payer: Medicare HMO | Admitting: Oncology

## 2023-03-07 ENCOUNTER — Telehealth: Payer: Self-pay

## 2023-03-07 ENCOUNTER — Inpatient Hospital Stay: Payer: Medicare HMO

## 2023-03-07 NOTE — Patient Outreach (Signed)
  Care Coordination   Initial Visit Note   03/07/2023 Name: Angela Phillips MRN: [id number] DOB: [birth year]  Angela Phillips is Phillips 77 y.o. year old female who sees Moon, Amy A, NP for primary care. I spoke with  Angela Phillips by phone today.  What matters to the patients health and wellness today?  Placed call to patient to review and offer [facility] care coordination program,. Patient reports that she is doing well an denies any needs today.  Reviewed pending cardiology appointment.    SDOH assessments and interventions completed:  No     Care Coordination Interventions:  No, not indicated   Follow up plan: No further intervention required.   Encounter Outcome:  Pt. Refused   Angela Pavy, RN, BSN, CEN [facility] [employer] [phone]

## 2023-03-14 DIAGNOSIS — J45909 Unspecified asthma, uncomplicated: Secondary | ICD-10-CM | POA: Diagnosis not present

## 2023-03-27 NOTE — Progress Notes (Signed)
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 Bronchoscopy with biopsy at that time revealed atypical cells. She was deemed a poor surgical candidate due to multiple comorbidities including severe oxygen dependent COPD. She completed stereotactic radiation therapy to both lesions in late February 2022. Follow up imaging and office visit in July of 2022 with Dr. Roselind Messier revealed her to be stable with no suspicious areas and expected radiation changes. CT in January of 2023 revealed post treatment changes with no new or progressive findings. In August she had weight loss, fatigue and weakness, and CT 05/09/22 revealed continued evolution of post-treatment changes in the right lower lobe and left upper lobe.  She has now had a new CT scan done on October 29, 2022 which reveals postradiation changes but no evidence of recurrent or metastatic disease.  She has a small pleural effusion.  She has a new mild compression of the T8 vertebral body.  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  She was started on Prolia injections on August 06, 2022 and tolerated this without any difficulty.  She is on calcium supplement 1200 mg daily and her calcium level is normal.  Plan She has a new compression deformity at the T8  vertebral body.  She has known osteoporosis with a bone density scan done July 12, 2022.  The T-score of the femur was -3.0 and the T-score of the forearm was even worse.  She received her first injection of Prolia at the end of November and tolerated it without any difficulty.  Patient states that she does not feel the best and complains of a cough with clear phlegm, chills, dizziness, pain in the right side of her chest, and occasional pain in her head. Patient has had multiple falls. She informed me that she has been having problems with her heart and has been feeling heart palpitations associated with a sensation of her pulse in her neck about 3-4 times a day accompanied with dizziness, recently when walking from the kitchen to living room she had a syncopal episode. She went to the cardiologist and had cardiac telemetry monitoring done on 01/28/2023. The monitor showed underlying normal rhythm with a single 11 beat run of SVT and rare premature beats. Patient had an echocardiogram done on 02/19/2023 that revealed  left ventricular ejection fraction of 60 to 65%, GLS-19.4%. Left ventricular diastolic parameters are consistent with Grade I diastolic dysfunction (impaired relaxation). Right ventricular systolic function is normal and the right ventricular size is severely enlarged, there is normal pulmonary artery systolic pressure. Her right atrial size was severely dilated. Her tricuspid valve regurgitation is moderate and aortic valve regurgitation is mild, no aortic stenosis is present. She has a follow-up appointment with her cardiologist scheduled. I will schedule a CT chest and MRI of the brain. I advised her to not skip meals as she only eats  once a day and when she feels dizzy and lightheaded to consume something such as juice or food and to check her blood sugar, a random blood sugar last time was 74. Her labs today are pending. I will see her back within the next 2-3 weeks after her CT and MRI scan  to review the results in person. She understands and agrees with this plan of care and knows to call if she has further concerns regarding her lung cancers.  I provided 25 minutes of time during this this encounter and > 50% was spent counseling as documented under my assessment and plan.   [facility] AT [facility] [address] [place] [address] Dept: [phone] Dept Fax: [phone]   No orders of the defined types were placed in this encounter.   CHIEF COMPLAINT:  CC: A 77 year old female with bilateral non-small cell lung cancers  Current Treatment:  Surveillance  INTERVAL HISTORY:  Angela Phillips is here today for repeat clinical assessment for her bilateral non-small cell lung cancers.  These were treated with stereotactic radiation successfully.  She has a new compression deformity at the T8 vertebral body.  She has known osteoporosis with a bone density scan done July 12, 2022.  The T-score of the femur was -3.0 and the T-score of the forearm was even worse.  She received her first injection of Prolia at the end of November and tolerated it without any difficulty.  Patient states that she does not feel the best and complains of a cough with clear phlegm, chills, dizziness, pain in the right side of her chest, and occasional pain in her head. Patient has had multiple falls. She informed me that she has been having problems with her heart and has been feeling heart palpitations associated with a sensation of her pulse in her neck about 3-4 times a day accompanied with dizziness, recently when walking from the kitchen to living room she had a syncopal episode. She went to the cardiologist and had cardiac telemetry monitoring done on 01/28/2023. The monitor showed underlying normal rhythm with a single 11 beat run of SVT and rare premature beats. Patient had an echocardiogram done on 02/19/2023 that revealed  left ventricular ejection  fraction of 60 to 65%, GLS-19.4%. Left ventricular diastolic parameters are consistent with Grade I diastolic dysfunction (impaired relaxation). Right ventricular systolic function is normal and the right ventricular size is severely enlarged, there is normal pulmonary artery systolic pressure. Her right atrial size was severely dilated. Her tricuspid valve regurgitation is moderate and aortic valve regurgitation is mild, no aortic stenosis is present. She has a follow-up appointment with her cardiologist scheduled. I will schedule a CT chest and MRI of the brain. I advised her to not skip meals as she only eats once a day and when she feels dizzy and lightheaded to consume something such as juice or food and to check her blood sugar, a random blood sugar last time was 74. Her labs today are pending. I will see her back within the next 2-3 weeks after her CT and MRI scan to review the results in person. She denies signs of infection such as sore throat, sinus drainage, or urinary symptoms.  She denies fevers. She denies pain. She denies nausea and vomiting. Her appetite is good and her weight has been stable.   I have reviewed the past medical history, past surgical history, social history and family history with the patient and they are  unchanged from previous note.  ALLERGIES:  is allergic to aspirin, buspirone, celecoxib, chlorpheniramine, citalopram, ezetimibe, hydrocodone-acetaminophen, prochlorperazine, statins, and venlafaxine.  MEDICATIONS:  Current Outpatient Medications  Medication Sig Dispense Refill   albuterol (PROVENTIL) (2.5 MG/3ML) 0.083% nebulizer solution Take 2.5 mg by nebulization every 6 (six) hours as needed for wheezing or shortness of breath.     albuterol (VENTOLIN HFA) 108 (90 Base) MCG/ACT inhaler Inhale 2 puffs into the lungs every 6 (six) hours as needed for wheezing or shortness of breath.     celecoxib (CELEBREX) 200 MG capsule Take 200 mg by mouth daily.     clonazePAM  (KLONOPIN) 1 MG tablet Take 1 mg by mouth every 6 (six) hours as needed for anxiety.     diclofenac (VOLTAREN) 75 MG EC tablet Take 75 mg by mouth 2 (two) times daily.     diphenoxylate-atropine (LOMOTIL) 2.5-0.025 MG tablet Take 1 tablet by mouth as needed.     FLUoxetine (PROZAC) 20 MG capsule Take 20 mg by mouth 2 (two) times daily.     gabapentin (NEURONTIN) 300 MG capsule Take 300 mg by mouth at bedtime.     levalbuterol (XOPENEX) 0.63 MG/3ML nebulizer solution SMARTSIG:1 Via Inhaler     losartan-hydrochlorothiazide (HYZAAR) 50-12.5 MG tablet Take 1 tablet by mouth daily.     meloxicam (MOBIC) 7.5 MG tablet Take 7.5 mg by mouth 2 (two) times daily.     methocarbamol (ROBAXIN) 500 MG tablet Take 500-1,000 mg by mouth every 6 (six) hours as needed.     mirtazapine (REMERON) 7.5 MG tablet Take 7.5 mg by mouth at bedtime.     montelukast (SINGULAIR) 10 MG tablet Take 1 tablet by mouth at bedtime.     Multiple Vitamin (MULTI-VITAMINS) TABS Take 1 tablet by mouth daily. Centrum silver     omeprazole (PRILOSEC) 40 MG capsule Take 40 mg by mouth daily.     ondansetron (ZOFRAN) 4 MG tablet Take 4 mg by mouth every 4 (four) hours as needed.     predniSONE (DELTASONE) 10 MG tablet Take 10 mg by mouth daily.     Probiotic Product (PROBIOTIC ADVANCED PO) Take by mouth. 60 billion     simvastatin (ZOCOR) 40 MG tablet Take 20 mg by mouth at bedtime.     SYMBICORT 160-4.5 MCG/ACT inhaler Inhale 2 puffs into the lungs 2 (two) times daily.     traMADol (ULTRAM) 50 MG tablet Take by mouth.     No current facility-administered medications for this visit.    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)   10/11/2020 Cancer Staging   Staging form: Lung, AJCC 8th Edition - Clinical stage from 10/11/2020: Stage IA2 (cT1b, cN0, cM0) - Signed by Dellia Beckwith, MD on 06/10/2021 Histopathologic  type: Adenocarcinoma, NOS Stage prefix: Initial diagnosis Histologic grade (G): GX Histologic grading system: 4 grade system Laterality: Left Tumor size (mm): 16 Lymph-vascular invasion (LVI): Presence of LVI unknown/indeterminate Diagnostic confirmation: Radiography and other imaging techniques without microscopic confirmation Staged by: Managing physician Stage used in treatment planning: Yes National guidelines used in treatment planning: Yes Type of national guideline used in treatment planning: NCCN Staging comments: SBRT   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)  10/11/2020 Initial Diagnosis   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)   10/11/2020 Cancer Staging   Staging form:  Lung, AJCC 8th Edition - Clinical stage from 10/11/2020: Stage IA3 (cT1c, cN0, cM0) - Signed by Dellia Beckwith, MD on 06/10/2021 Stage prefix: Initial diagnosis Histologic grade (G): GX Histologic grading system: 4 grade system Laterality: Right Tumor size (mm): 22 Lymph-vascular invasion (LVI): Presence of LVI unknown/indeterminate Diagnostic confirmation: Radiography and other imaging techniques without microscopic confirmation Staged by: Managing physician Stage used in treatment planning: Yes National guidelines used in treatment planning: Yes Type of national guideline used in treatment planning: NCCN       REVIEW OF SYSTEMS:  Review of Systems  Constitutional: Negative.  Negative for appetite change, chills, diaphoresis, fatigue, fever and unexpected weight change.  HENT:  Negative.  Negative for hearing loss, lump/mass, mouth sores, nosebleeds, sore throat, tinnitus, trouble swallowing and voice change.   Eyes: Negative.  Negative for eye problems and icterus.  Respiratory:  Positive for cough (with clear/white phlegm). Negative for chest tightness, hemoptysis, shortness of breath and wheezing.   Cardiovascular:  Positive for chest pain (right side). Negative for leg swelling and  palpitations.  Gastrointestinal: Negative.  Negative for abdominal distention, abdominal pain, blood in stool, constipation, diarrhea, nausea, rectal pain and vomiting.  Endocrine: Negative.   Genitourinary: Negative.  Negative for bladder incontinence, difficulty urinating, dyspareunia, dysuria, frequency, hematuria, menstrual problem, nocturia, pelvic pain, vaginal bleeding and vaginal discharge.   Musculoskeletal: Negative.  Negative for arthralgias, back pain, flank pain, gait problem, myalgias, neck pain and neck stiffness.  Skin: Negative.  Negative for itching, rash and wound.  Neurological:  Positive for dizziness, headaches and light-headedness. Negative for extremity weakness, gait problem, numbness, seizures and speech difficulty.       Syncopal episode  Hematological: Negative.  Negative for adenopathy. Does not bruise/bleed easily.  Psychiatric/Behavioral: Negative.  Negative for confusion, decreased concentration, depression, sleep disturbance and suicidal ideas. The patient is not nervous/anxious.    VITALS:  Blood pressure 121/66, pulse 69, temperature 97.8 F (36.6 C), temperature source Oral, resp. rate 18, height 5\' 1"  (1.549 m), weight 119 lb 1.6 oz (54 kg), SpO2 100%.  Wt Readings from Last 3 Encounters:  03/28/23 119 lb 1.6 oz (54 kg)  01/28/23 116 lb 12.8 oz (53 kg)  01/10/23 119 lb (54 kg)    Body mass index is 22.5 kg/m.  Performance status (ECOG): 1 - Symptomatic but completely ambulatory  PHYSICAL EXAM:  Physical Exam Vitals and nursing note reviewed.  Constitutional:      General: She is not in acute distress.    Appearance: Normal appearance. She is normal weight. She is not ill-appearing, toxic-appearing or diaphoretic.  HENT:     Head: Normocephalic and atraumatic.     Right Ear: Tympanic membrane, ear canal and external ear normal. There is no impacted cerumen.     Left Ear: Tympanic membrane, ear canal and external ear normal. There is no impacted  cerumen.     Nose: Nose normal. No congestion or rhinorrhea.     Mouth/Throat:     Mouth: Mucous membranes are moist.     Pharynx: Oropharynx is clear. No oropharyngeal exudate or posterior oropharyngeal erythema.  Eyes:     General: No scleral icterus.       Left eye: No discharge.     Extraocular Movements: Extraocular movements intact.     Conjunctiva/sclera: Conjunctivae normal.     Pupils: Pupils are equal, round, and reactive to light.  Neck:     Vascular: No carotid bruit.  Cardiovascular:     Rate  and Rhythm: Normal rate and regular rhythm.     Pulses: Normal pulses.     Heart sounds: Normal heart sounds. No murmur heard.    No friction rub. No gallop.  Pulmonary:     Effort: Pulmonary effort is normal. No respiratory distress.     Breath sounds: No stridor. Wheezing present. No rhonchi or rales.     Comments: Diffuse expiratory wheezing in all lung fields Chest:     Chest wall: No tenderness.  Abdominal:     General: Bowel sounds are normal. There is no distension.     Palpations: Abdomen is soft. There is no hepatomegaly, splenomegaly or mass.     Tenderness: There is no abdominal tenderness. There is no right CVA tenderness, left CVA tenderness, guarding or rebound.     Hernia: No hernia is present.  Musculoskeletal:        General: No swelling, tenderness, deformity or signs of injury. Normal range of motion.     Cervical back: Normal range of motion and neck supple. No rigidity or tenderness.     Right lower leg: No edema.     Left lower leg: No edema.  Lymphadenopathy:     Cervical: No cervical adenopathy.     Right cervical: No superficial, deep or posterior cervical adenopathy.    Left cervical: No superficial, deep or posterior cervical adenopathy.     Upper Body:     Right upper body: No supraclavicular, axillary or pectoral adenopathy.     Left upper body: No supraclavicular, axillary or pectoral adenopathy.  Skin:    General: Skin is warm and dry.      Coloration: Skin is not jaundiced or pale.     Findings: No bruising, erythema, lesion or rash.  Neurological:     General: No focal deficit present.     Mental Status: She is alert and oriented to person, place, and time. Mental status is at baseline.     Cranial Nerves: No cranial nerve deficit.     Sensory: No sensory deficit.     Motor: No weakness.     Coordination: Coordination normal.     Gait: Gait normal.     Deep Tendon Reflexes: Reflexes normal.  Psychiatric:        Mood and Affect: Mood normal.        Behavior: Behavior normal.        Thought Content: Thought content normal.        Judgment: Judgment normal.   LABORATORY DATA:  I have reviewed the data as listed  Component 10/29/2022  CEA 3.1      Component Value Date/Time   NA 139 03/28/2023 1454   NA 137 10/29/2022 0000   K 3.7 03/28/2023 1454   CL 102 03/28/2023 1454   CO2 26 03/28/2023 1454   GLUCOSE 138 (H) 03/28/2023 1454   BUN 15 03/28/2023 1454   BUN 15 10/29/2022 0000   CREATININE 0.89 03/28/2023 1454   CALCIUM 9.1 03/28/2023 1454   PROT 7.0 03/28/2023 1454   PROT 6.3 08/02/2020 1233   ALBUMIN 3.6 03/28/2023 1454   ALBUMIN 4.1 08/02/2020 1233   AST 25 03/28/2023 1454   ALT 28 03/28/2023 1454   ALKPHOS 61 03/28/2023 1454   BILITOT 0.4 03/28/2023 1454   GFRNONAA >60 03/28/2023 1454   GFRAA 93 08/02/2020 1233   No results found for: "SPEP", "UPEP"  Lab Results  Component Value Date   WBC 5.8 03/28/2023   NEUTROABS 3.2 03/28/2023  HGB 12.4 03/28/2023   HCT 41.3 03/28/2023   MCV 88.2 03/28/2023   PLT 284 03/28/2023     Chemistry      Component Value Date/Time   NA 139 03/28/2023 1454   NA 137 10/29/2022 0000   K 3.7 03/28/2023 1454   CL 102 03/28/2023 1454   CO2 26 03/28/2023 1454   BUN 15 03/28/2023 1454   BUN 15 10/29/2022 0000   CREATININE 0.89 03/28/2023 1454   GLU 86 10/29/2022 0000      Component Value Date/Time   CALCIUM 9.1 03/28/2023 1454   ALKPHOS 61 03/28/2023 1454    AST 25 03/28/2023 1454   ALT 28 03/28/2023 1454   BILITOT 0.4 03/28/2023 1454         Component Ref Range & Units 2 mo ago (01/10/23) 2 mo ago (01/10/23)  Troponin I (High Sensitivity) <18 ng/L 20 High  21 High  CM     Component Ref Range & Units 2 mo ago  Magnesium 1.7 - 2.4 mg/dL 1.9    RADIOGRAPHIC STUDIES: I have personally reviewed the radiological images as listed and agreed with the findings in the report.       I,Jasmine M Lassiter,acting as a scribe for Dellia Beckwith, MD.,have documented all relevant documentation on the behalf of Dellia Beckwith, MD,as directed by  Dellia Beckwith, MD while in the presence of Dellia Beckwith, MD.

## 2023-03-28 ENCOUNTER — Inpatient Hospital Stay: Payer: Medicare HMO | Attending: Oncology | Admitting: Oncology

## 2023-03-28 ENCOUNTER — Inpatient Hospital Stay: Payer: Medicare HMO

## 2023-03-28 ENCOUNTER — Other Ambulatory Visit: Payer: Self-pay | Admitting: Oncology

## 2023-03-28 VITALS — BP 121/66 | HR 69 | Temp 97.8°F | Resp 18 | Ht 61.0 in | Wt 119.1 lb

## 2023-03-28 DIAGNOSIS — R42 Dizziness and giddiness: Secondary | ICD-10-CM | POA: Diagnosis not present

## 2023-03-28 DIAGNOSIS — C3412 Malignant neoplasm of upper lobe, left bronchus or lung: Secondary | ICD-10-CM

## 2023-03-28 DIAGNOSIS — M810 Age-related osteoporosis without current pathological fracture: Secondary | ICD-10-CM | POA: Diagnosis not present

## 2023-03-28 DIAGNOSIS — C3431 Malignant neoplasm of lower lobe, right bronchus or lung: Secondary | ICD-10-CM

## 2023-03-28 DIAGNOSIS — Z79899 Other long term (current) drug therapy: Secondary | ICD-10-CM | POA: Diagnosis not present

## 2023-03-28 DIAGNOSIS — R059 Cough, unspecified: Secondary | ICD-10-CM | POA: Insufficient documentation

## 2023-03-28 DIAGNOSIS — R079 Chest pain, unspecified: Secondary | ICD-10-CM | POA: Insufficient documentation

## 2023-03-28 DIAGNOSIS — R519 Headache, unspecified: Secondary | ICD-10-CM | POA: Insufficient documentation

## 2023-03-28 LAB — CMP (CANCER CENTER ONLY)
ALT: 28 U/L (ref 0–44)
AST: 25 U/L (ref 15–41)
Albumin: 3.6 g/dL (ref 3.5–5.0)
Alkaline Phosphatase: 61 U/L (ref 38–126)
Anion gap: 11 (ref 5–15)
BUN: 15 mg/dL (ref 8–23)
CO2: 26 mmol/L (ref 22–32)
Calcium: 9.1 mg/dL (ref 8.9–10.3)
Chloride: 102 mmol/L (ref 98–111)
Creatinine: 0.89 mg/dL (ref 0.44–1.00)
GFR, Estimated: >60 mL/min (ref >60)
Glucose, Bld: 138 mg/dL — ABNORMAL HIGH (ref 70–99)
Potassium: 3.7 mmol/L (ref 3.5–5.1)
Sodium: 139 mmol/L (ref 135–145)
Total Bilirubin: 0.4 mg/dL (ref 0.3–1.2)
Total Protein: 7 g/dL (ref 6.5–8.1)

## 2023-03-28 LAB — CBC WITH DIFFERENTIAL (CANCER CENTER ONLY)
Abs Immature Granulocytes: 0.03 10*3/uL (ref 0.00–0.07)
Basophils Absolute: 0 10*3/uL (ref 0.0–0.1)
Basophils Relative: 1 %
Eosinophils Absolute: 0 10*3/uL (ref 0.0–0.5)
Eosinophils Relative: 1 %
HCT: 41.3 % (ref 36.0–46.0)
Hemoglobin: 12.4 g/dL (ref 12.0–15.0)
Immature Granulocytes: 1 %
Lymphocytes Relative: 37 %
Lymphs Abs: 2.2 10*3/uL (ref 0.7–4.0)
MCH: 26.5 pg (ref 26.0–34.0)
MCHC: 30 g/dL (ref 30.0–36.0)
MCV: 88.2 fL (ref 80.0–100.0)
Monocytes Absolute: 0.3 10*3/uL (ref 0.1–1.0)
Monocytes Relative: 6 %
Neutro Abs: 3.2 10*3/uL (ref 1.7–7.7)
Neutrophils Relative %: 54 %
Platelet Count: 284 10*3/uL (ref 150–400)
RBC: 4.68 MIL/uL (ref 3.87–5.11)
RDW: 15.2 % (ref 11.5–15.5)
WBC Count: 5.8 10*3/uL (ref 4.0–10.5)
nRBC: 0 % (ref 0.0–0.2)

## 2023-03-29 LAB — CEA: CEA: 3.2 ng/mL (ref 0.0–4.7)

## 2023-03-31 ENCOUNTER — Telehealth: Payer: Self-pay

## 2023-03-31 ENCOUNTER — Telehealth: Payer: Self-pay | Admitting: Oncology

## 2023-03-31 ENCOUNTER — Telehealth: Payer: Self-pay | Admitting: Cardiology

## 2023-03-31 NOTE — Telephone Encounter (Signed)
Pt states "my heart is beating across my neck." She states it makes her feel weak and "funny" Pt denies any other symptoms. Please advise.

## 2023-03-31 NOTE — Telephone Encounter (Signed)
-----   Message from Dellia Beckwith sent at 03/28/2023  5:33 PM EDT ----- Regarding: call Can tell her all her labs normal

## 2023-03-31 NOTE — Telephone Encounter (Signed)
Spoke with pt who states that for 2 months she has been feeling her heartbeat in her neck. Pt reports that she had 1 syncopal episode last week but did not go to the ED for evaluation. Pt reports that she feels dizzy with standing. Advised to keep a BP/HR log of sitting to standing BP's. Advised to go to the ED for evaluation of syncope if it reoccurs. Pt recently wore a heart monitor which only showed SVT. Pt verbalized understanding and had no additional questions.

## 2023-03-31 NOTE — Telephone Encounter (Signed)
Contacted pt to schedule a follow up appt and to notify her of upcoming scans appt date, time and instructions.. Unable to reach via phone, voicemail was left requesting that she call the office back.   Scheduling Message Entered by Gery Pray H on 03/28/2023 at  5:27 PM Priority: Routine <No visit type provided>  Department: [facility]  Provider:  Scheduling Notes:  Pls sched CT chest and MRI brain, then have her return to review results after done

## 2023-03-31 NOTE — Telephone Encounter (Signed)
Attempted to contact patient. No answer and no VM.  

## 2023-04-01 ENCOUNTER — Telehealth: Payer: Self-pay | Admitting: Oncology

## 2023-04-01 NOTE — Telephone Encounter (Signed)
Patient has been scheduled. Aware of all appt dates and times.   Scheduling Message Entered by Gery Pray H on 03/28/2023 at  5:27 PM Priority: Routine <No visit type provided>  Department: [facility]  Provider:  Scheduling Notes:  Pls sched CT chest and MRI brain, then have her return to review results after done

## 2023-04-07 DIAGNOSIS — R5383 Other fatigue: Secondary | ICD-10-CM | POA: Diagnosis not present

## 2023-04-07 DIAGNOSIS — R918 Other nonspecific abnormal finding of lung field: Secondary | ICD-10-CM | POA: Diagnosis not present

## 2023-04-07 DIAGNOSIS — F411 Generalized anxiety disorder: Secondary | ICD-10-CM | POA: Diagnosis not present

## 2023-04-07 DIAGNOSIS — J4550 Severe persistent asthma, uncomplicated: Secondary | ICD-10-CM | POA: Diagnosis not present

## 2023-04-09 ENCOUNTER — Encounter: Payer: Self-pay | Admitting: Oncology

## 2023-04-09 DIAGNOSIS — C3412 Malignant neoplasm of upper lobe, left bronchus or lung: Secondary | ICD-10-CM | POA: Diagnosis not present

## 2023-04-09 DIAGNOSIS — J984 Other disorders of lung: Secondary | ICD-10-CM | POA: Diagnosis not present

## 2023-04-09 DIAGNOSIS — I700 Atherosclerosis of aorta: Secondary | ICD-10-CM | POA: Diagnosis not present

## 2023-04-09 DIAGNOSIS — C3491 Malignant neoplasm of unspecified part of right bronchus or lung: Secondary | ICD-10-CM | POA: Diagnosis not present

## 2023-04-09 NOTE — Telephone Encounter (Signed)
Created in error

## 2023-04-11 ENCOUNTER — Encounter: Payer: Self-pay | Admitting: Oncology

## 2023-04-11 DIAGNOSIS — M7021 Olecranon bursitis, right elbow: Secondary | ICD-10-CM | POA: Diagnosis not present

## 2023-04-14 ENCOUNTER — Telehealth: Payer: Self-pay

## 2023-04-14 DIAGNOSIS — J45909 Unspecified asthma, uncomplicated: Secondary | ICD-10-CM | POA: Diagnosis not present

## 2023-04-14 NOTE — Telephone Encounter (Signed)
MRI scheduled for 04/21/23. CT was done but not read as of 04/14/23

## 2023-04-14 NOTE — Telephone Encounter (Signed)
-----   Message from Dellia Beckwith sent at 04/11/2023  9:15 AM EDT ----- Regarding: CT, MRI She is having CT chest and MRI brain and then I see 8/6  to review results.  Have they been done yet?

## 2023-04-15 ENCOUNTER — Emergency Department (HOSPITAL_COMMUNITY)
Admission: EM | Admit: 2023-04-15 | Discharge: 2023-04-15 | Disposition: A | Discharge status: Home / Self Care | Payer: Medicare HMO | Attending: Emergency Medicine | Admitting: Emergency Medicine

## 2023-04-15 ENCOUNTER — Encounter (HOSPITAL_COMMUNITY): Payer: Self-pay

## 2023-04-15 ENCOUNTER — Other Ambulatory Visit: Payer: Self-pay

## 2023-04-15 ENCOUNTER — Telehealth: Payer: Self-pay | Admitting: Oncology

## 2023-04-15 ENCOUNTER — Inpatient Hospital Stay: Payer: Medicare HMO | Attending: Oncology | Admitting: Oncology

## 2023-04-15 DIAGNOSIS — R42 Dizziness and giddiness: Secondary | ICD-10-CM | POA: Diagnosis not present

## 2023-04-15 DIAGNOSIS — F419 Anxiety disorder, unspecified: Secondary | ICD-10-CM | POA: Diagnosis not present

## 2023-04-15 DIAGNOSIS — R002 Palpitations: Secondary | ICD-10-CM | POA: Diagnosis not present

## 2023-04-15 LAB — COMPREHENSIVE METABOLIC PANEL
ALT: 21 U/L (ref 0–44)
AST: 29 U/L (ref 15–41)
Albumin: 3.7 g/dL (ref 3.5–5.0)
Alkaline Phosphatase: 70 U/L (ref 38–126)
Anion gap: 11 (ref 5–15)
BUN: 11 mg/dL (ref 8–23)
CO2: 24 mmol/L (ref 22–32)
Calcium: 9.4 mg/dL (ref 8.9–10.3)
Chloride: 102 mmol/L (ref 98–111)
Creatinine, Ser: 0.91 mg/dL (ref 0.44–1.00)
GFR, Estimated: >60 mL/min (ref >60)
Glucose, Bld: 107 mg/dL — ABNORMAL HIGH (ref 70–99)
Potassium: 3.5 mmol/L (ref 3.5–5.1)
Sodium: 137 mmol/L (ref 135–145)
Total Bilirubin: 0.6 mg/dL (ref 0.3–1.2)
Total Protein: 7 g/dL (ref 6.5–8.1)

## 2023-04-15 LAB — CBC WITH DIFFERENTIAL/PLATELET
Abs Immature Granulocytes: 0.04 10*3/uL (ref 0.00–0.07)
Basophils Absolute: 0.1 10*3/uL (ref 0.0–0.1)
Basophils Relative: 1 %
Eosinophils Absolute: 0 10*3/uL (ref 0.0–0.5)
Eosinophils Relative: 0 %
HCT: 44.2 % (ref 36.0–46.0)
Hemoglobin: 13.5 g/dL (ref 12.0–15.0)
Immature Granulocytes: 0 %
Lymphocytes Relative: 37 %
Lymphs Abs: 3.7 10*3/uL (ref 0.7–4.0)
MCH: 27.3 pg (ref 26.0–34.0)
MCHC: 30.5 g/dL (ref 30.0–36.0)
MCV: 89.5 fL (ref 80.0–100.0)
Monocytes Absolute: 0.7 10*3/uL (ref 0.1–1.0)
Monocytes Relative: 7 %
Neutro Abs: 5.6 10*3/uL (ref 1.7–7.7)
Neutrophils Relative %: 55 %
Platelets: 282 10*3/uL (ref 150–400)
RBC: 4.94 MIL/uL (ref 3.87–5.11)
RDW: 16.1 % — ABNORMAL HIGH (ref 11.5–15.5)
WBC: 10.1 10*3/uL (ref 4.0–10.5)
nRBC: 0 % (ref 0.0–0.2)

## 2023-04-15 LAB — TROPONIN I (HIGH SENSITIVITY): Troponin I (High Sensitivity): 9 ng/L (ref <18)

## 2023-04-15 MED ORDER — SODIUM CHLORIDE 0.9 % IV BOLUS
1000.0000 mL | Freq: Once | INTRAVENOUS | Status: AC
  Administered 2023-04-15: 1000 mL via INTRAVENOUS

## 2023-04-15 NOTE — Discharge Instructions (Addendum)
As we discussed, your heart is back in the rhythm right now.  I recommend you talk to your cardiologist and you may need a Holter monitor.  Please continue your current meds  Return to ER if you have worse palpitations or shortness of breath with passing out

## 2023-04-15 NOTE — ED Provider Notes (Signed)
[place] EMERGENCY DEPARTMENT AT [facility] Provider Note   CSN: [id number] Arrival date & time: 04/15/23  1509     History  Chief Complaint  Patient presents with   Palpitations    FRED MCCANLESS is a 77 y.o. 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  Patient states that she was at the preop area.  She states that her husband is getting surgery today and she is very anxious for him.  She states that she that she almost passed out and felt very lightheaded and dizzy.  Patient has a history of SVT with similar symptoms.  Patient states that by the time she got to the ER, her symptoms have resolved.  The history is provided by the patient.       Home Medications Prior to Admission medications   Medication Sig Start Date End Date Taking? Authorizing Provider  albuterol (PROVENTIL) (2.5 MG/3ML) 0.083% nebulizer solution Take 2.5 mg by nebulization every 6 (six) hours as needed for wheezing or shortness of breath.    [provider]  albuterol (VENTOLIN HFA) 108 (90 Base) MCG/ACT inhaler Inhale 2 puffs into the lungs every 6 (six) hours as needed for wheezing or shortness of breath.    [provider]  celecoxib (CELEBREX) 200 MG capsule Take 200 mg by mouth daily. 07/04/22   [provider]  clonazePAM (KLONOPIN) 1 MG tablet Take 1 mg by mouth every 6 (six) hours as needed for anxiety.    [provider]  diclofenac (VOLTAREN) 75 MG EC tablet Take 75 mg by mouth 2 (two) times daily. 05/03/22   [provider]  diphenoxylate-atropine (LOMOTIL) 2.5-0.025 MG tablet Take 1 tablet by mouth as needed. 10/18/22   [provider]  FLUoxetine (PROZAC) 20 MG capsule Take 20 mg by mouth 2 (two) times daily.    [provider]  gabapentin (NEURONTIN) 300 MG capsule Take 300 mg by mouth at bedtime.    [provider]  levalbuterol Pauline Aus) 0.63 MG/3ML nebulizer solution SMARTSIG:1 Via Inhaler 07/04/21    [provider]  losartan-hydrochlorothiazide (HYZAAR) 50-12.5 MG tablet Take 1 tablet by mouth daily. 10/02/21   [provider]  meloxicam (MOBIC) 7.5 MG tablet Take 7.5 mg by mouth 2 (two) times daily. 12/20/21   [provider]  methocarbamol (ROBAXIN) 500 MG tablet Take 500-1,000 mg by mouth every 6 (six) hours as needed. 07/04/22   [provider]  mirtazapine (REMERON) 7.5 MG tablet Take 7.5 mg by mouth at bedtime. 10/25/21   [provider]  montelukast (SINGULAIR) 10 MG tablet Take 1 tablet by mouth at bedtime.    [provider]  Multiple Vitamin (MULTI-VITAMINS) TABS Take 1 tablet by mouth daily. Centrum silver    [provider]  omeprazole (PRILOSEC) 40 MG capsule Take 40 mg by mouth daily. 08/13/20   [provider]  ondansetron (ZOFRAN) 4 MG tablet Take 4 mg by mouth every 4 (four) hours as needed. 10/03/21   [provider]  predniSONE (DELTASONE) 10 MG tablet Take 10 mg by mouth daily. 10/28/22   [provider]  Probiotic Product (PROBIOTIC ADVANCED PO) Take by mouth. 60 billion    [provider]  simvastatin (ZOCOR) 40 MG tablet Take 20 mg by mouth at bedtime.    [provider]  SYMBICORT 160-4.5 MCG/ACT inhaler Inhale 2 puffs into the lungs 2 (two) times daily. 08/07/20   [provider]  traMADol (ULTRAM) 50 MG tablet  Take by mouth. 10/31/22   [provider]      Allergies    Aspirin, Buspirone, Celecoxib, Chlorpheniramine, Citalopram, Ezetimibe, Hydrocodone-acetaminophen, Prochlorperazine, Statins, and Venlafaxine    Review of Systems   Review of Systems  Cardiovascular:  Positive for palpitations.  All other systems reviewed and are negative.   Physical Exam Updated Vital Signs BP (!) 149/68 (BP Location: Left Arm)   Pulse 70   Temp 97.6 F (36.4 C)   Resp 17   Ht 5\' 1"  (1.549 m)   Wt 53.5 kg   SpO2 99%   BMI 22.30 kg/m  Physical  Exam Vitals and nursing note reviewed.  HENT:     Head: Normocephalic.     Nose: Nose normal.     Mouth/Throat:     Mouth: Mucous membranes are moist.  Eyes:     Extraocular Movements: Extraocular movements intact.     Pupils: Pupils are equal, round, and reactive to light.  Cardiovascular:     Pulses: Normal pulses.     Heart sounds: Normal heart sounds.  Pulmonary:     Effort: Pulmonary effort is normal.     Breath sounds: Normal breath sounds.  Abdominal:     General: Abdomen is flat.     Palpations: Abdomen is soft.  Musculoskeletal:        General: Normal range of motion.     Cervical back: Normal range of motion and neck supple.  Skin:    General: Skin is warm.  Neurological:     General: No focal deficit present.     Mental Status: She is alert.  Psychiatric:        Mood and Affect: Mood normal.     ED Results / Procedures / Treatments   Labs (all labs ordered are listed, but only abnormal results are displayed) Labs Reviewed  CBC WITH DIFFERENTIAL/PLATELET - Abnormal; Notable for the following components:      Result Value   RDW 16.1 (*)    All other components within normal limits  COMPREHENSIVE METABOLIC PANEL - Abnormal; Notable for the following components:   Glucose, Bld 107 (*)    All other components within normal limits  TROPONIN I (HIGH SENSITIVITY)    EKG EKG Interpretation Date/Time:  Tuesday April 15 2023 15:02:01 EDT Ventricular Rate:  70 PR Interval:  148 QRS Duration:  114 QT Interval:  432 QTC Calculation: 466 R Axis:   60  Text Interpretation: Normal sinus rhythm Incomplete right bundle branch block Nonspecific ST and T wave abnormality Abnormal ECG When compared with ECG of 10-Jan-2023 15:31, PREVIOUS ECG IS PRESENT Confirmed by Richardean Canal [phone]) on 04/15/2023 4:26:07 PM  Radiology No results found.  Procedures Procedures    Medications Ordered in ED Medications  sodium chloride 0.9 % bolus 1,000 mL (1,000 mLs Intravenous  New Bag/Given 04/15/23 1714)    ED Course/ Medical Decision Making/ A&P                                 Medical Decision Making CICLEY MAIN is a 77 y.o. female here presenting with palpitations.  Patient has not been eating and drinking much and was with her husband in the preop area.  Patient then had some palpitations.  I think she likely had SVT which she had before.  By the time she got to the ER, patient is back in sinus rhythm.  Patient appears  dehydrated so we will check electrolytes and hydrate patient.  6:08 PM I reviewed patient's labs and they were unremarkable.  Patient was given IV fluids and felt better.  Patient states that she has been having palpitations every day.  I have encouraged her to follow-up with her cardiologist and she may need a Holter monitor.  Problems Addressed: Palpitations: acute illness or injury  Amount and/or Complexity of Data Reviewed Labs: ordered. Decision-making details documented in ED Course. ECG/medicine tests: ordered and independent interpretation performed. Decision-making details documented in ED Course.    Final Clinical Impression(s) / ED Diagnoses Final diagnoses:  None    Rx / DC Orders ED Discharge Orders     None         Charlynne Pander, MD 04/15/23 [phone]

## 2023-04-15 NOTE — ED Triage Notes (Signed)
Pt reports she had an episode of palpitations that she felt in her neck which happened when she was waking today about 10 min ago, and caused her to feel dizzy and light headed. Hx of cardiac ablation and SVT. She states the palpitations are gone at this time. Denies pain.

## 2023-04-15 NOTE — Telephone Encounter (Signed)
04/15/2023  Patient's spouse had surgery today and she is in triage with heart issues

## 2023-04-15 NOTE — Progress Notes (Incomplete)
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 Bronchoscopy with biopsy at that time revealed atypical cells. She was deemed a poor surgical candidate due to multiple comorbidities including severe oxygen dependent COPD. She completed stereotactic radiation therapy to both lesions in late February 2022. Follow up imaging and office visit in July of 2022 with Dr. Roselind Messier revealed her to be stable with no suspicious areas and expected radiation changes. CT in January of 2023 revealed post treatment changes with no new or progressive findings. In August she had weight loss, fatigue and weakness, and CT 05/09/22 revealed continued evolution of post-treatment changes in the right lower lobe and left upper lobe.  She has now had a new CT scan done on October 29, 2022 which reveals postradiation changes but no evidence of recurrent or metastatic disease.  She has a small pleural effusion.  She has a new mild compression of the T8 vertebral body.  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  She was started on Prolia injections on August 06, 2022 and tolerated this without any difficulty.  She is on calcium supplement 1200 mg daily and her calcium level is normal.  Plan She has a new compression deformity at the T8  vertebral body.  She has known osteoporosis with a bone density scan done July 12, 2022.  The T-score of the femur was -3.0 and the T-score of the forearm was even worse.  She received her first injection of Prolia at the end of November and tolerated it without any difficulty.  Patient states that she does not feel the best and complains of a cough with clear phlegm, chills, dizziness, pain in the right side of her chest, and occasional pain in her head. Patient has had multiple falls. She informed me that she has been having problems with her heart and has been feeling heart palpitations associated with a sensation of her pulse in her neck about 3-4 times a day accompanied with dizziness, recently when walking from the kitchen to living room she had a syncopal episode. She went to the cardiologist and had cardiac telemetry monitoring done on 01/28/2023. The monitor showed underlying normal rhythm with a single 11 beat run of SVT and rare premature beats. Patient had an echocardiogram done on 02/19/2023 that revealed  left ventricular ejection fraction of 60 to 65%, GLS-19.4%. Left ventricular diastolic parameters are consistent with Grade I diastolic dysfunction (impaired relaxation). Right ventricular systolic function is normal and the right ventricular size is severely enlarged, there is normal pulmonary artery systolic pressure. Her right atrial size was severely dilated. Her tricuspid valve regurgitation is moderate and aortic valve regurgitation is mild, no aortic stenosis is present. She has a follow-up appointment with her cardiologist scheduled. I will schedule a CT chest and MRI of the brain. I advised her to not skip meals as she only  eats once a day and when she feels dizzy and lightheaded to consume something such as juice or food and to check her blood sugar, a random blood sugar last time was 74. Her labs today are pending. I will see her back within the next 2-3 weeks after her CT and MRI scan  to review the results in person. She understands and agrees with this plan of care and knows to call if she has further concerns regarding her lung cancers.  I provided 25 minutes of time during this this encounter and > 50% was spent counseling as documented under my assessment and plan.   [facility] AT [facility] [address] [place] [address] Dept: [phone] Dept Fax: [phone]   No orders of the defined types were placed in this encounter.   CHIEF COMPLAINT:  CC: A 77 year old female with bilateral non-small cell lung cancers  Current Treatment:  Surveillance  INTERVAL HISTORY:  Angela Phillips is here today for repeat clinical assessment for her bilateral non-small cell lung cancers.  These were treated with stereotactic radiation successfully.  She has a new compression deformity at the T8 vertebral body.  She has known osteoporosis with a bone density scan done July 12, 2022.  The T-score of the femur was -3.0 and the T-score of the forearm was even worse.  She received her first injection of Prolia at the end of November and tolerated it without any difficulty.  Patient states that she feels *** and ***      I will see her  back in *** with CBC and CMP.  She  denies signs of infections such as sore throat, sinus drainage, cough or urinary symptoms. She  denies fever or recurrent chills. She  also deny nausea, vomiting, chest pain, or dyspnea. Her  appetite is *** and Her  weight {Weight change:10426}   does not feel the best and complains of a cough with clear phlegm, chills, dizziness, pain in the right side of her chest, and occasional pain in her head. Patient has had multiple falls. She informed me that she has been having problems with her heart and has been feeling heart palpitations associated with a sensation of her pulse in her neck about 3-4 times a day accompanied with dizziness, recently when walking from the  kitchen to living room she had a syncopal episode. She went to the cardiologist and had cardiac telemetry monitoring done on 01/28/2023. The monitor showed underlying normal rhythm with a single 11 beat run of SVT and rare premature beats. Patient had an echocardiogram done on 02/19/2023 that revealed  left ventricular ejection fraction of 60 to 65%, GLS-19.4%. Left ventricular diastolic parameters are consistent with Grade I diastolic dysfunction (impaired relaxation). Right ventricular systolic function is normal and the right ventricular size is severely enlarged, there is normal pulmonary artery systolic pressure. Her right atrial size was severely dilated. Her tricuspid valve regurgitation is moderate and aortic valve regurgitation is mild, no aortic stenosis is present. She has a follow-up appointment with her cardiologist scheduled. I will schedule a CT chest and MRI of the brain. I advised her to not skip meals as she only eats once a day and when she feels dizzy and lightheaded to consume something such as juice or food and to check her blood sugar, a random blood sugar last time was 74. Her labs today are pending. I will see her back within the next 2-3 weeks after her  CT and MRI scan to review the results in person. She denies signs of infection such as sore throat, sinus drainage, or urinary symptoms.  She denies fevers. She denies pain. She denies nausea and vomiting. Her appetite is good and her weight has been stable.   I have reviewed the past medical history, past surgical history, social history and family history with the patient and they are unchanged from previous note.  ALLERGIES:  is allergic to aspirin, buspirone, celecoxib, chlorpheniramine, citalopram, ezetimibe, hydrocodone-acetaminophen, prochlorperazine, statins, and venlafaxine.  MEDICATIONS:  Current Outpatient Medications  Medication Sig Dispense Refill   albuterol (PROVENTIL) (2.5 MG/3ML) 0.083% nebulizer solution Take 2.5  mg by nebulization every 6 (six) hours as needed for wheezing or shortness of breath.     albuterol (VENTOLIN HFA) 108 (90 Base) MCG/ACT inhaler Inhale 2 puffs into the lungs every 6 (six) hours as needed for wheezing or shortness of breath.     celecoxib (CELEBREX) 200 MG capsule Take 200 mg by mouth daily.     clonazePAM (KLONOPIN) 1 MG tablet Take 1 mg by mouth every 6 (six) hours as needed for anxiety.     diclofenac (VOLTAREN) 75 MG EC tablet Take 75 mg by mouth 2 (two) times daily.     diphenoxylate-atropine (LOMOTIL) 2.5-0.025 MG tablet Take 1 tablet by mouth as needed.     FLUoxetine (PROZAC) 20 MG capsule Take 20 mg by mouth 2 (two) times daily.     gabapentin (NEURONTIN) 300 MG capsule Take 300 mg by mouth at bedtime.     levalbuterol (XOPENEX) 0.63 MG/3ML nebulizer solution SMARTSIG:1 Via Inhaler     losartan-hydrochlorothiazide (HYZAAR) 50-12.5 MG tablet Take 1 tablet by mouth daily.     meloxicam (MOBIC) 7.5 MG tablet Take 7.5 mg by mouth 2 (two) times daily.     methocarbamol (ROBAXIN) 500 MG tablet Take 500-1,000 mg by mouth every 6 (six) hours as needed.     mirtazapine (REMERON) 7.5 MG tablet Take 7.5 mg by mouth at bedtime.     montelukast (SINGULAIR) 10 MG tablet Take 1 tablet by mouth at bedtime.     Multiple Vitamin (MULTI-VITAMINS) TABS Take 1 tablet by mouth daily. Centrum silver     omeprazole (PRILOSEC) 40 MG capsule Take 40 mg by mouth daily.     ondansetron (ZOFRAN) 4 MG tablet Take 4 mg by mouth every 4 (four) hours as needed.     predniSONE (DELTASONE) 10 MG tablet Take 10 mg by mouth daily.     Probiotic Product (PROBIOTIC ADVANCED PO) Take by mouth. 60 billion     simvastatin (ZOCOR) 40 MG tablet Take 20 mg by mouth at bedtime.     SYMBICORT 160-4.5 MCG/ACT inhaler Inhale 2 puffs into the lungs 2 (two) times daily.     traMADol (ULTRAM) 50 MG tablet Take by mouth.     No current facility-administered medications for this visit.    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)   10/11/2020 Cancer Staging   Staging form: Lung, AJCC 8th Edition - Clinical stage from 10/11/2020: Stage IA2 (cT1b, cN0, cM0) - Signed by Dellia Beckwith, MD on 06/10/2021 Histopathologic type: Adenocarcinoma, NOS Stage prefix: Initial diagnosis Histologic grade (G): GX Histologic grading system: 4 grade system Laterality: Left Tumor size (mm): 16 Lymph-vascular invasion (LVI): Presence of LVI unknown/indeterminate Diagnostic confirmation: Radiography  and other imaging techniques without microscopic confirmation Staged by: Managing physician Stage used in treatment planning: Yes National guidelines used in treatment planning: Yes Type of national guideline used in treatment planning: NCCN Staging comments: SBRT   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)  10/11/2020 Initial Diagnosis   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)   10/11/2020 Cancer Staging   Staging form: Lung, AJCC 8th Edition - Clinical stage from 10/11/2020: Stage IA3 (cT1c, cN0, cM0) - Signed by Dellia Beckwith, MD on 06/10/2021 Stage prefix: Initial diagnosis Histologic grade (G): GX Histologic grading system: 4 grade system Laterality: Right Tumor size (mm): 22 Lymph-vascular invasion (LVI): Presence of LVI unknown/indeterminate Diagnostic confirmation: Radiography and other imaging techniques without microscopic confirmation Staged by: Managing physician Stage used in treatment planning: Yes National guidelines used in treatment planning: Yes Type of national guideline used in treatment planning: NCCN       REVIEW OF SYSTEMS:  Review of Systems  Constitutional: Negative.  Negative for appetite change, chills, diaphoresis, fatigue, fever and unexpected weight change.  HENT:  Negative.  Negative for hearing loss, lump/mass, mouth sores, nosebleeds, sore throat, tinnitus,  trouble swallowing and voice change.   Eyes: Negative.  Negative for eye problems and icterus.  Respiratory:  Positive for cough (with clear/white phlegm). Negative for chest tightness, hemoptysis, shortness of breath and wheezing.   Cardiovascular:  Positive for chest pain (right side). Negative for leg swelling and palpitations.  Gastrointestinal: Negative.  Negative for abdominal distention, abdominal pain, blood in stool, constipation, diarrhea, nausea, rectal pain and vomiting.  Endocrine: Negative.   Genitourinary: Negative.  Negative for bladder incontinence, difficulty urinating, dyspareunia, dysuria, frequency, hematuria, menstrual problem, nocturia, pelvic pain, vaginal bleeding and vaginal discharge.   Musculoskeletal: Negative.  Negative for arthralgias, back pain, flank pain, gait problem, myalgias, neck pain and neck stiffness.  Skin: Negative.  Negative for itching, rash and wound.  Neurological:  Positive for dizziness, headaches and light-headedness. Negative for extremity weakness, gait problem, numbness, seizures and speech difficulty.       Syncopal episode  Hematological: Negative.  Negative for adenopathy. Does not bruise/bleed easily.  Psychiatric/Behavioral: Negative.  Negative for confusion, decreased concentration, depression, sleep disturbance and suicidal ideas. The patient is not nervous/anxious.    VITALS:  There were no vitals taken for this visit.  Wt Readings from Last 3 Encounters:  03/28/23 119 lb 1.6 oz (54 kg)  01/28/23 116 lb 12.8 oz (53 kg)  01/10/23 119 lb (54 kg)    There is no height or weight on file to calculate BMI.  Performance status (ECOG): 1 - Symptomatic but completely ambulatory  PHYSICAL EXAM:  Physical Exam Vitals and nursing note reviewed.  Constitutional:      General: She is not in acute distress.    Appearance: Normal appearance. She is normal weight. She is not ill-appearing, toxic-appearing or diaphoretic.  HENT:     Head:  Normocephalic and atraumatic.     Right Ear: Tympanic membrane, ear canal and external ear normal. There is no impacted cerumen.     Left Ear: Tympanic membrane, ear canal and external ear normal. There is no impacted cerumen.     Nose: Nose normal. No congestion or rhinorrhea.     Mouth/Throat:     Mouth: Mucous membranes are moist.     Pharynx: Oropharynx is clear. No oropharyngeal exudate or posterior oropharyngeal erythema.  Eyes:     General: No scleral icterus.  Left eye: No discharge.     Extraocular Movements: Extraocular movements intact.     Conjunctiva/sclera: Conjunctivae normal.     Pupils: Pupils are equal, round, and reactive to light.  Neck:     Vascular: No carotid bruit.  Cardiovascular:     Rate and Rhythm: Normal rate and regular rhythm.     Pulses: Normal pulses.     Heart sounds: Normal heart sounds. No murmur heard.    No friction rub. No gallop.  Pulmonary:     Effort: Pulmonary effort is normal. No respiratory distress.     Breath sounds: No stridor. Wheezing present. No rhonchi or rales.     Comments: Diffuse expiratory wheezing in all lung fields Chest:     Chest wall: No tenderness.  Abdominal:     General: Bowel sounds are normal. There is no distension.     Palpations: Abdomen is soft. There is no hepatomegaly, splenomegaly or mass.     Tenderness: There is no abdominal tenderness. There is no right CVA tenderness, left CVA tenderness, guarding or rebound.     Hernia: No hernia is present.  Musculoskeletal:        General: No swelling, tenderness, deformity or signs of injury. Normal range of motion.     Cervical back: Normal range of motion and neck supple. No rigidity or tenderness.     Right lower leg: No edema.     Left lower leg: No edema.  Lymphadenopathy:     Cervical: No cervical adenopathy.     Right cervical: No superficial, deep or posterior cervical adenopathy.    Left cervical: No superficial, deep or posterior cervical  adenopathy.     Upper Body:     Right upper body: No supraclavicular, axillary or pectoral adenopathy.     Left upper body: No supraclavicular, axillary or pectoral adenopathy.  Skin:    General: Skin is warm and dry.     Coloration: Skin is not jaundiced or pale.     Findings: No bruising, erythema, lesion or rash.  Neurological:     General: No focal deficit present.     Mental Status: She is alert and oriented to person, place, and time. Mental status is at baseline.     Cranial Nerves: No cranial nerve deficit.     Sensory: No sensory deficit.     Motor: No weakness.     Coordination: Coordination normal.     Gait: Gait normal.     Deep Tendon Reflexes: Reflexes normal.  Psychiatric:        Mood and Affect: Mood normal.        Behavior: Behavior normal.        Thought Content: Thought content normal.        Judgment: Judgment normal.   LABORATORY DATA:  I have reviewed the data as listed           Component Ref Range & Units 2 wk ago (03/28/23) 9 mo ago (07/12/22) 1 yr ago (04/11/22) 1 yr ago (01/09/22) 1 yr ago (09/21/21) 1 yr ago (07/23/21) 1 yr ago (06/06/21)  CEA 0.0 - 4.7 ng/mL 3.2 3.0 CM 2.7 CM 2.3 CM 4.0 CM 4.7 CM 6.5 High  CM        Component Value Date/Time   NA 139 03/28/2023 1454   NA 137 10/29/2022 0000   K 3.7 03/28/2023 1454   CL 102 03/28/2023 1454   CO2 26 03/28/2023 1454   GLUCOSE 138 (H) 03/28/2023  1454   BUN 15 03/28/2023 1454   BUN 15 10/29/2022 0000   CREATININE 0.89 03/28/2023 1454   CALCIUM 9.1 03/28/2023 1454   PROT 7.0 03/28/2023 1454   PROT 6.3 08/02/2020 1233   ALBUMIN 3.6 03/28/2023 1454   ALBUMIN 4.1 08/02/2020 1233   AST 25 03/28/2023 1454   ALT 28 03/28/2023 1454   ALKPHOS 61 03/28/2023 1454   BILITOT 0.4 03/28/2023 1454   GFRNONAA >60 03/28/2023 1454   GFRAA 93 08/02/2020 1233   No results found for: "SPEP", "UPEP"  Lab Results  Component Value Date   WBC 5.8 03/28/2023   NEUTROABS 3.2 03/28/2023   HGB 12.4 03/28/2023    HCT 41.3 03/28/2023   MCV 88.2 03/28/2023   PLT 284 03/28/2023     Chemistry      Component Value Date/Time   NA 139 03/28/2023 1454   NA 137 10/29/2022 0000   K 3.7 03/28/2023 1454   CL 102 03/28/2023 1454   CO2 26 03/28/2023 1454   BUN 15 03/28/2023 1454   BUN 15 10/29/2022 0000   CREATININE 0.89 03/28/2023 1454   GLU 86 10/29/2022 0000      Component Value Date/Time   CALCIUM 9.1 03/28/2023 1454   ALKPHOS 61 03/28/2023 1454   AST 25 03/28/2023 1454   ALT 28 03/28/2023 1454   BILITOT 0.4 03/28/2023 1454         Component Ref Range & Units  (01/10/23) 2 mo ago (01/10/23)  Troponin I (High Sensitivity) <18 ng/L 20 High  21 High  CM     Component Ref Range & Units 01/10/23  Magnesium 1.7 - 2.4 mg/dL 1.9    RADIOGRAPHIC STUDIES: I have personally reviewed the radiological images as listed and agreed with the findings in the report.  EXAM: 04/09/2023 CT CHEST WITH CONTRAST IMPRESSION No findings of active/progressive malignancy. Stable band of volume loss in the right lower lobe and left upper lobe para mediastinal region, likely the result of prior radiation therapy. Progressive sclerosis and collapse of the T8 vertebral body now along both endplates with 60% loss of vertebral body height. Based on the original imaging the appearance is probably benign/osteoporotic although the mixed appearance is currently ambiguous. Aortic, coronary, and systemic atherosclerosis.             I,Oluwatobi Asade,acting as a scribe for Dellia Beckwith, MD.,have documented all relevant documentation on the behalf of Dellia Beckwith, MD,as directed by  Dellia Beckwith, MD while in the presence of Dellia Beckwith, MD.

## 2023-04-21 DIAGNOSIS — R9089 Other abnormal findings on diagnostic imaging of central nervous system: Secondary | ICD-10-CM | POA: Diagnosis not present

## 2023-04-21 DIAGNOSIS — C3490 Malignant neoplasm of unspecified part of unspecified bronchus or lung: Secondary | ICD-10-CM | POA: Diagnosis not present

## 2023-04-21 DIAGNOSIS — C3412 Malignant neoplasm of upper lobe, left bronchus or lung: Secondary | ICD-10-CM | POA: Diagnosis not present

## 2023-04-22 ENCOUNTER — Telehealth: Payer: Self-pay

## 2023-04-22 NOTE — Telephone Encounter (Signed)
Transition Care Management Unsuccessful Follow-up Telephone Call  Date of discharge and from where:  [facility] 8[facility]  Attempts:  1st Attempt  Reason for unsuccessful TCM follow-up call:  No answer/busy   Lenard Forth [facility] Guide, [facility] [phone] [address] [place], [place], [place] [address] Phone: [phone] Email: [email or web address]

## 2023-04-23 ENCOUNTER — Telehealth: Payer: Self-pay

## 2023-04-23 NOTE — Telephone Encounter (Signed)
Transition Care Management Unsuccessful Follow-up Telephone Call  Date of discharge and from where:  [facility]  Attempts:  2nd Attempt  Reason for unsuccessful TCM follow-up call:  No answer/busy   Lenard Forth [facility] Guide, [facility] [phone] [address] [place], [place], [place] [address] Phone: [phone] Email: [email or web address]

## 2023-04-24 ENCOUNTER — Encounter: Payer: Self-pay | Admitting: Oncology

## 2023-04-29 ENCOUNTER — Other Ambulatory Visit: Payer: Self-pay

## 2023-04-29 DIAGNOSIS — J449 Chronic obstructive pulmonary disease, unspecified: Secondary | ICD-10-CM | POA: Insufficient documentation

## 2023-04-29 DIAGNOSIS — J45909 Unspecified asthma, uncomplicated: Secondary | ICD-10-CM | POA: Insufficient documentation

## 2023-04-29 DIAGNOSIS — Z923 Personal history of irradiation: Secondary | ICD-10-CM | POA: Insufficient documentation

## 2023-04-29 DIAGNOSIS — M069 Rheumatoid arthritis, unspecified: Secondary | ICD-10-CM

## 2023-04-29 DIAGNOSIS — M1990 Unspecified osteoarthritis, unspecified site: Secondary | ICD-10-CM | POA: Insufficient documentation

## 2023-04-29 DIAGNOSIS — M1380 Other specified arthritis, unspecified site: Secondary | ICD-10-CM | POA: Insufficient documentation

## 2023-04-29 DIAGNOSIS — Z853 Personal history of malignant neoplasm of breast: Secondary | ICD-10-CM | POA: Insufficient documentation

## 2023-04-29 DIAGNOSIS — E785 Hyperlipidemia, unspecified: Secondary | ICD-10-CM | POA: Insufficient documentation

## 2023-04-29 HISTORY — DX: Rheumatoid arthritis, unspecified: M06.9

## 2023-04-29 HISTORY — DX: Unspecified osteoarthritis, unspecified site: M19.90

## 2023-04-29 HISTORY — DX: Hyperlipidemia, unspecified: E78.5

## 2023-04-29 HISTORY — DX: Personal history of malignant neoplasm of breast: Z85.3

## 2023-04-30 ENCOUNTER — Encounter: Payer: Self-pay | Admitting: Cardiology

## 2023-04-30 ENCOUNTER — Encounter: Payer: Self-pay | Admitting: Oncology

## 2023-04-30 ENCOUNTER — Telehealth: Payer: Self-pay | Admitting: Cardiology

## 2023-04-30 ENCOUNTER — Ambulatory Visit: Payer: Medicare HMO | Attending: Cardiology | Admitting: Cardiology

## 2023-04-30 VITALS — BP 150/70 | HR 65 | Ht 61.0 in | Wt 118.8 lb

## 2023-04-30 DIAGNOSIS — I209 Angina pectoris, unspecified: Secondary | ICD-10-CM

## 2023-04-30 DIAGNOSIS — I200 Unstable angina: Secondary | ICD-10-CM | POA: Diagnosis not present

## 2023-04-30 DIAGNOSIS — R0609 Other forms of dyspnea: Secondary | ICD-10-CM

## 2023-04-30 DIAGNOSIS — R002 Palpitations: Secondary | ICD-10-CM

## 2023-04-30 DIAGNOSIS — I10 Essential (primary) hypertension: Secondary | ICD-10-CM

## 2023-04-30 HISTORY — DX: Other forms of dyspnea: R06.09

## 2023-04-30 HISTORY — DX: Angina pectoris, unspecified: I20.9

## 2023-04-30 MED ORDER — METOPROLOL TARTRATE 50 MG PO TABS: ORAL_TABLET | ORAL | 0 refills | Status: DC

## 2023-04-30 NOTE — Patient Instructions (Signed)
Medication Instructions:  Your physician recommends that you continue on your current medications as directed. Please refer to the Current Medication list given to you today.   *If you need a refill on your cardiac medications before your next appointment, please call your pharmacy*   Lab Work: Your physician recommends that you have a BMP done today.   If you have labs (blood work) drawn today and your tests are completely normal, you will receive your results only by: MyChart Message (if you have MyChart) OR A paper copy in the mail If you have any lab test that is abnormal or we need to change your treatment, we will call you to review the results.   Testing/Procedures:   Your cardiac CT will be scheduled at one of the below locations:   [facility] [address] [place], [place] [address] [phone]  If scheduled at [facility], please arrive at the [facility] and Children's Entrance (Entrance C2) of [facility] 30 minutes prior to test start time. You can use the FREE valet parking offered at entrance C (encouraged to control the heart rate for the test)  Proceed to the [facility] Radiology Department (first floor) to check-in and test prep.  All radiology patients and guests should use entrance C2 at [facility], accessed from [facility], even though the hospital's physical address listed is [address]     Please follow these instructions carefully (unless otherwise directed): On the Night Before the Test: Be sure to Drink plenty of water. Do not consume any caffeinated/decaffeinated beverages or chocolate 12 hours prior to your test. Do not take any antihistamines 12 hours prior to your test.  On the Day of the Test: Drink plenty of water until 1 hour prior to the test. Do not eat any food 1 hour prior to test. You may take your regular medications prior to the test.  Take metoprolol (Lopressor) two  hours prior to test. FEMALES- please wear underwire-free bra if available, avoid dresses & tight clothing      After the Test: Drink plenty of water. After receiving IV contrast, you may experience a mild flushed feeling. This is normal. On occasion, you may experience a mild rash up to 24 hours after the test. This is not dangerous. If this occurs, you can take Benadryl 25 mg and increase your fluid intake. If you experience trouble breathing, this can be serious. If it is severe call 911 IMMEDIATELY. If it is mild, please call our office. If you take any of these medications: Glipizide/Metformin, Avandament, Glucavance, please do not take 48 hours after completing test unless otherwise instructed.  We will call to schedule your test 2-4 weeks out understanding that some insurance companies will need an authorization prior to the service being performed.   For non-scheduling related questions, please contact the cardiac imaging nurse navigator should you have any questions/concerns: Rockwell Alexandria, Cardiac Imaging Nurse Navigator Larey Brick, Cardiac Imaging Nurse Navigator [facility] Direct Office Dial: [phone]   For scheduling needs, including cancellations and rescheduling, please call [place], [phone].   A zio monitor was ordered today. It will remain on for 14 days. We will place the monitor once your CT is completed. You will then return monitor and event diary in provided box. It takes 1-2 weeks for report to be downloaded and returned to [place]. We will call you with the results. If monitor falls off or has  orange flashing light, please call Zio for further instructions.  Your next appointment:   6 month(s)  The format for your next appointment:   In Person  Provider:   Belva Crome, MD   Other Instructions Cardiac CT Angiogram A cardiac CT angiogram is a procedure to look at the heart and the area around the heart. It may be done to  help find the cause of chest pains or other symptoms of heart disease. During this procedure, a substance called contrast dye is injected into the blood vessels in the area to be checked. A large X-ray machine, called a CT scanner, then takes detailed pictures of the heart and the surrounding area. The procedure is also sometimes called a coronary CT angiogram, coronary artery scanning, or CTA. A cardiac CT angiogram allows the health care provider to see how well blood is flowing to and from the heart. The health care provider will be able to see if there are any problems, such as: Blockage or narrowing of the coronary arteries in the heart. Fluid around the heart. Signs of weakness or disease in the muscles, valves, and tissues of the heart. Tell a health care provider about: Any allergies you have. This is especially important if you have had a previous allergic reaction to contrast dye. All medicines you are taking, including vitamins, herbs, eye drops, creams, and over-the-counter medicines. Any blood disorders you have. Any surgeries you have had. Any medical conditions you have. Whether you are pregnant or may be pregnant. Any anxiety disorders, chronic pain, or other conditions you have that may increase your stress or prevent you from lying still. What are the risks? Generally, this is a safe procedure. However, problems may occur, including: Bleeding. Infection. Allergic reactions to medicines or dyes. Damage to other structures or organs. Kidney damage from the contrast dye that is used. Increased risk of cancer from radiation exposure. This risk is low. Talk with your health care provider about: The risks and benefits of testing. How you can receive the lowest dose of radiation. What happens before the procedure? Wear comfortable clothing and remove any jewelry, glasses, dentures, and hearing aids. Follow instructions from your health care provider about eating and drinking. This  may include: For 12 hours before the procedure -- avoid caffeine. This includes tea, coffee, soda, energy drinks, and diet pills. Drink plenty of water or other fluids that do not have caffeine in them. Being well hydrated can prevent complications. For 4-6 hours before the procedure -- stop eating and drinking. The contrast dye can cause nausea, but this is less likely if your stomach is empty. Ask your health care provider about changing or stopping your regular medicines. This is especially important if you are taking diabetes medicines, blood thinners, or medicines to treat problems with erections (erectile dysfunction). What happens during the procedure?  Hair on your chest may need to be removed so that small sticky patches called electrodes can be placed on your chest. These will transmit information that helps to monitor your heart during the procedure. An IV will be inserted into one of your veins. You might be given a medicine to control your heart rate during the procedure. This will help to ensure that good images are obtained. You will be asked to lie on an exam table. This table will slide in and out of the CT machine during the procedure. Contrast dye will be injected into the IV. You might feel warm, or you may get a metallic  taste in your mouth. You will be given a medicine called nitroglycerin. This will relax or dilate the arteries in your heart. The table that you are lying on will move into the CT machine tunnel for the scan. The person running the machine will give you instructions while the scans are being done. You may be asked to: Keep your arms above your head. Hold your breath. Stay very still, even if the table is moving. When the scanning is complete, you will be moved out of the machine. The IV will be removed. The procedure may vary among health care providers and hospitals. What can I expect after the procedure? After your procedure, it is common to have: A  metallic taste in your mouth from the contrast dye. A feeling of warmth. A headache from the nitroglycerin. Follow these instructions at home: Take over-the-counter and prescription medicines only as told by your health care provider. If you are told, drink enough fluid to keep your urine pale yellow. This will help to flush the contrast dye out of your body. Most people can return to their normal activities right after the procedure. Ask your health care provider what activities are safe for you. It is up to you to get the results of your procedure. Ask your health care provider, or the department that is doing the procedure, when your results will be ready. Keep all follow-up visits as told by your health care provider. This is important. Contact a health care provider if: You have any symptoms of allergy to the contrast dye. These include: Shortness of breath. Rash or hives. A racing heartbeat. Summary A cardiac CT angiogram is a procedure to look at the heart and the area around the heart. It may be done to help find the cause of chest pains or other symptoms of heart disease. During this procedure, a large X-ray machine, called a CT scanner, takes detailed pictures of the heart and the surrounding area after a contrast dye has been injected into blood vessels in the area. Ask your health care provider about changing or stopping your regular medicines before the procedure. This is especially important if you are taking diabetes medicines, blood thinners, or medicines to treat erectile dysfunction. If you are told, drink enough fluid to keep your urine pale yellow. This will help to flush the contrast dye out of your body. This information is not intended to replace advice given to you by your health care provider. Make sure you discuss any questions you have with your health care provider. Document Revised: 04/21/2019 Document Reviewed: 04/21/2019 Elsevier Patient Education  2020 [employer].

## 2023-04-30 NOTE — Telephone Encounter (Signed)
Left vm to call back

## 2023-04-30 NOTE — Progress Notes (Signed)
Cardiology Office Note:    Date:  04/30/2023   ID:  Angela Phillips, DOB 1945/12/31, MRN [id number]  PCP:  Hurshel Party, NP  Cardiologist:  Garwin Brothers, MD   Referring MD: Hurshel Party, NP    ASSESSMENT:    1. Primary hypertension   2. Unstable angina pectoris (HCC)   3. Palpitations   4. Dyspnea on exertion   5. Angina pectoris (HCC)    PLAN:    In order of problems listed above:  Coronary artery disease and dyspnea on exertion.  Patient's symptoms are very concerning.  She has multivessel coronary artery disease.  This could represent an anginal episode.  In view of this I recommended CT coronary angiography and the patient is agreeable.  She knows to go to the nearest emergency room for any significant symptoms. Essential hypertension: Blood pressure stable and diet was emphasized.  She tells me that her blood pressure is fine at home and.  She is anxious about seeing [place] at our office for the first time today. Palpitations: TSH is normal.  Will do her repeat 2-week monitoring as her symptoms are increasing.  She is agreeable. Mixed dyslipidemia: On lipid-lowering medications following.  Primary care provider.  Goal LDL must be less than 60. Patient will be seen in follow-up appointment in 6 months or earlier if the patient has any concerns.    Medication Adjustments/Labs and Tests Ordered: Current medicines are reviewed at length with the patient today.  Concerns regarding medicines are outlined above.  Orders Placed This Encounter  Procedures   CT CORONARY MORPH W/CTA COR W/SCORE W/CA W/CM &/OR WO/CM   Basic metabolic panel   LONG TERM MONITOR (3-14 DAYS)   EKG 12-Lead   Meds ordered this encounter  Medications   metoprolol tartrate (LOPRESSOR) 50 MG tablet    Sig: Take 50 mg Lopressor 2 hours prior to your CT scan for a heart rate greater than 55.    Dispense:  1 tablet    Refill:  0     No chief complaint on file.    History of Present Illness:    Angela Phillips is a 77 y.o. female.  Patient has past medical history of palpitations.  She gives history of dyspnea on exertion and chest pain.  She is very concerned about it.  Her palpitations are regular.  She underwent event monitoring for a month in the month of June and this was unremarkable..  At the time of my evaluation, the patient is alert awake oriented and in no distress.  Her dyspnea on exertion sometimes also makes her very dizzy.  At the time of my evaluation, the patient is alert awake oriented and in no distress.  She has history of essential hypertension her CT has revealed aortic and coronary atherosclerosis.  Her chest pain is not related to exertion.  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, treated with SBRT  Clinical stage I     COPD (chronic obstructive pulmonary disease) (HCC)    Dyslipidemia (high LDL; low HDL) 08/13/2016   Dysuria 40/98/1191   Essential  hypertension 07/05/2016   Falls 11/20/2016   H/O ultrasound guided needle biopsy of lung 08/24/2020   History of radiation therapy 10/24/2020-11/06/2020   SBRT to left and right lungs   Dr Antony Blackbird   HTN (hypertension) 11/20/2016   Hyperlipidemia 04/29/2023   Hypokalemia 07/23/2021   Impaired mobility and ADLs 10/28/2017   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   Osteoporosis 07/12/2022   Palpitations 07/05/2016   Paroxysmal SVT (supraventricular tachycardia) 07/05/2016   Personal history of malignant neoplasm of breast 04/29/2023   Pneumothorax on right 08/24/2020   Rheumatoid arthritis (HCC) 04/29/2023   Subdural hematoma (HCC)  11/20/2016   SVT (supraventricular tachycardia) 10/19/2018    Past Surgical History:  Procedure Laterality Date   ABDOMINAL HYSTERECTOMY     CARDIAC CATHETERIZATION     CARPAL TUNNEL RELEASE     CATARACT EXTRACTION     CHOLECYSTECTOMY     HEMORROIDECTOMY     JOINT REPLACEMENT     R knee   ORIF left elbow Left 12/2021   SVT ABLATION N/A 10/19/2018   Procedure: SVT ABLATION;  Surgeon: Marinus Maw, MD;  Location: [facility] INVASIVE CV LAB;  Service: Cardiovascular;  Laterality: N/A;   TOE SURGERY     VIDEO BRONCHOSCOPY WITH ENDOBRONCHIAL NAVIGATION  08/24/2020   VIDEO BRONCHOSCOPY WITH ENDOBRONCHIAL NAVIGATION N/A 08/24/2020   Procedure: VIDEO BRONCHOSCOPY WITH ENDOBRONCHIAL NAVIGATION;  Surgeon: Loreli Slot, MD;  Location: [facility] OR;  Service: Thoracic;  Laterality: N/A;    Current Medications: Current Meds  Medication Sig   albuterol (PROVENTIL) (2.5 MG/3ML) 0.083% nebulizer solution Take 2.5 mg by nebulization every 6 (six) hours as needed for wheezing or shortness of breath.   albuterol (VENTOLIN HFA) 108 (90 Base) MCG/ACT inhaler Inhale 2 puffs into the lungs every 6 (six) hours as needed for wheezing or shortness of breath.   celecoxib (CELEBREX) 200 MG capsule Take 200 mg by mouth 2 (two) times daily.   clonazePAM (KLONOPIN) 1 MG tablet Take 1 mg by mouth every 6 (six) hours as needed for anxiety.   diclofenac (VOLTAREN) 75 MG EC tablet Take 75 mg by mouth 2 (two) times daily.   diltiazem (CARDIZEM CD) 120 MG 24 hr capsule Take 120 mg by mouth daily.   FLUoxetine (PROZAC) 20 MG capsule Take 20 mg by mouth 2 (two) times daily.   gabapentin (NEURONTIN) 300 MG capsule Take 300 mg by mouth at bedtime.   metoprolol tartrate (LOPRESSOR) 50 MG tablet Take 50 mg Lopressor 2 hours prior to your CT scan for a heart rate greater than 55.   Multiple Vitamin (MULTI-VITAMINS) TABS Take 1 tablet by mouth daily. Centrum silver   omeprazole (PRILOSEC) 40 MG capsule Take 40 mg by mouth  daily.   simvastatin (ZOCOR) 40 MG tablet Take 40 mg by mouth at bedtime.     Allergies:   Aspirin, Atorvastatin, Buspirone, Celecoxib, Chlorpheniramine, Citalopram, Ezetimibe, Ezetimibe-simvastatin, Hydrocodone-acetaminophen, Prochlorperazine, Rosuvastatin, Statins, and Venlafaxine   Social History   Socioeconomic History   Marital status: Married    Spouse name: Not on file   Number of children: Not on file   Years of education: Not on file   Highest education level: Not on file  Occupational History   Not on file  Tobacco Use   Smoking status: Former    Current packs/day: 0.50    Average packs/day: 0.5 packs/day for 15.0 years (7.5 ttl pk-yrs)    Types: Cigarettes   Smokeless tobacco: Former  Quit date: 09/09/1989  Vaping Use   Vaping status: Never Used  Substance and Sexual Activity   Alcohol use: No   Drug use: No   Sexual activity: Not on file  Other Topics Concern   Not on file  Social History Narrative   Not on file   Social Determinants of Health   Financial Resource Strain: Low Risk  (08/20/2022)   Overall Financial Resource Strain (CARDIA)    Difficulty of Paying Living Expenses: Not very hard  Food Insecurity: No Food Insecurity (08/20/2022)   Hunger Vital Sign    Worried About Running Out of Food in the Last Year: Never true    Ran Out of Food in the Last Year: Never true  Transportation Needs: No Transportation Needs (08/20/2022)   PRAPARE - Administrator, Civil Service (Medical): No    Lack of Transportation (Non-Medical): No  Physical Activity: Inactive (08/20/2022)   Exercise Vital Sign    Days of Exercise per Week: 0 days    Minutes of Exercise per Session: 0 min  Stress: Stress Concern Present (08/20/2022)   [employer] of Occupational Health - Occupational Stress Questionnaire    Feeling of Stress : To some extent  Social Connections: Moderately Isolated (08/20/2022)   Social Connection and Isolation Panel [NHANES]     Frequency of Communication with Friends and Family: More than three times a week    Frequency of Social Gatherings with Friends and Family: More than three times a week    Attends Religious Services: Never    Database administrator or Organizations: No    Attends Engineer, structural: Never    Marital Status: Married     Family History: The patient's family history includes Dementia in her father; Diabetes in her mother; Heart disease in her mother; Thyroid disease in her brother and sister.  ROS:   Please see the history of present illness.    All other systems reviewed and are negative.  EKGs/Labs/Other Studies Reviewed:    The following studies were reviewed today: .Marland KitchenEKG Interpretation Date/Time:  Wednesday April 30 2023 13:06:12 EDT Ventricular Rate:  65 PR Interval:  146 QRS Duration:  110 QT Interval:  444 QTC Calculation: 461 R Axis:   67  Text Interpretation: Normal sinus rhythm Incomplete right bundle branch block Cannot rule out Anterior infarct , age undetermined Abnormal ECG When compared with ECG of 15-Apr-2023 15:02, No significant change was found Confirmed by Belva Crome [phone]) on 04/30/2023 1:20:13 PM     Recent Labs: 01/10/2023: Magnesium 1.9 04/15/2023: ALT 21; BUN 11; Creatinine, Ser 0.91; Hemoglobin 13.5; Platelets 282; Potassium 3.5; Sodium 137  Recent Lipid Panel    Component Value Date/Time   CHOL 270 (H) 08/02/2020 1233   TRIG 176 (H) 08/02/2020 1233   HDL 56 08/02/2020 1233   CHOLHDL 4.8 (H) 08/02/2020 1233   LDLCALC 181 (H) 08/02/2020 1233    Physical Exam:    VS:  BP (!) 150/70   Pulse 65   Ht 5\' 1"  (1.549 m)   Wt 118 lb 12.8 oz (53.9 kg)   SpO2 97%   BMI 22.45 kg/m     Wt Readings from Last 3 Encounters:  04/30/23 118 lb 12.8 oz (53.9 kg)  04/15/23 118 lb (53.5 kg)  03/28/23 119 lb 1.6 oz (54 kg)     GEN: Patient is in no acute distress HEENT: Normal NECK: No JVD; No carotid bruits LYMPHATICS: No  lymphadenopathy CARDIAC: Hear sounds  regular, 2/6 systolic murmur at the apex. RESPIRATORY:  Clear to auscultation without rales, wheezing or rhonchi  ABDOMEN: Soft, non-tender, non-distended MUSCULOSKELETAL:  No edema; No deformity  SKIN: Warm and dry NEUROLOGIC:  Alert and oriented x 3 PSYCHIATRIC:  Normal affect   Signed, Garwin Brothers, MD  04/30/2023 1:38 PM    [facility]

## 2023-04-30 NOTE — Telephone Encounter (Signed)
Pt calling  to clarify she should be taking or what else was added.

## 2023-05-01 ENCOUNTER — Telehealth: Payer: Self-pay | Admitting: Cardiology

## 2023-05-01 LAB — BASIC METABOLIC PANEL
BUN/Creatinine Ratio: 16 (ref 12–28)
BUN: 14 mg/dL (ref 8–27)
CO2: 26 mmol/L (ref 20–29)
Calcium: 9.4 mg/dL (ref 8.7–10.3)
Chloride: 103 mmol/L (ref 96–106)
Creatinine, Ser: 0.88 mg/dL (ref 0.57–1.00)
Glucose: 88 mg/dL (ref 70–99)
Potassium: 4.4 mmol/L (ref 3.5–5.2)
Sodium: 142 mmol/L (ref 134–144)
eGFR: 68 mL/min/{1.73_m2} (ref >59)

## 2023-05-01 NOTE — Telephone Encounter (Signed)
  Garwin Brothers, MD 05/01/2023  8:59 AM EDT Back to Top    The results of the study is unremarkable. Please inform patient. I will discuss in detail at next appointment. Cc  primary care/referring physician Garwin Brothers, MD 05/01/2023 8:59 AM

## 2023-05-01 NOTE — Telephone Encounter (Signed)
Spoke with patient. She reports she is still having spells -- heart starts beating hard in her neck and this causes dizziness. She has sit down and rest until the episode passes. Lasts several minutes. She was seen yesterday and plan is for cardiac CTA  Reviewed BMET results with patient. No further assistance needed at this time.

## 2023-05-01 NOTE — Telephone Encounter (Signed)
Patient is calling to follow up on lab results.

## 2023-05-01 NOTE — Telephone Encounter (Signed)
Left vm to call back

## 2023-05-01 NOTE — Telephone Encounter (Signed)
Spoke to patient and review lab results and plan for cardiac CTA. No long-term med changes made at visit on 04/30/23

## 2023-05-05 ENCOUNTER — Telehealth (HOSPITAL_COMMUNITY): Payer: Self-pay | Admitting: Emergency Medicine

## 2023-05-05 NOTE — Telephone Encounter (Signed)
Reaching out to patient to offer assistance regarding upcoming cardiac imaging study; pt verbalizes understanding of appt date/time, parking situation and where to check in, pre-test NPO status and medications ordered, and verified current allergies; name and call back number provided for further questions should they arise Sara Wallace RN Navigator Cardiac Imaging [facility] [phone] office [phone] cell 

## 2023-05-06 ENCOUNTER — Emergency Department (HOSPITAL_COMMUNITY): Payer: Medicare HMO

## 2023-05-06 ENCOUNTER — Inpatient Hospital Stay (HOSPITAL_COMMUNITY)
Admission: EM | Admit: 2023-05-06 | Discharge: 2023-05-14 | DRG: 286 | Disposition: A | Discharge status: Home Health | Payer: Medicare HMO | Attending: Family Medicine | Admitting: Family Medicine

## 2023-05-06 ENCOUNTER — Ambulatory Visit (HOSPITAL_COMMUNITY)
Admission: RE | Admit: 2023-05-06 | Discharge: 2023-05-06 | Disposition: A | Discharge status: Home / Self Care | Payer: Medicare HMO | Source: Ambulatory Visit | Attending: Cardiology | Admitting: Cardiology

## 2023-05-06 ENCOUNTER — Other Ambulatory Visit: Payer: Self-pay

## 2023-05-06 ENCOUNTER — Encounter: Payer: Self-pay | Admitting: Oncology

## 2023-05-06 DIAGNOSIS — Z923 Personal history of irradiation: Secondary | ICD-10-CM

## 2023-05-06 DIAGNOSIS — N390 Urinary tract infection, site not specified: Secondary | ICD-10-CM | POA: Diagnosis not present

## 2023-05-06 DIAGNOSIS — Z8349 Family history of other endocrine, nutritional and metabolic diseases: Secondary | ICD-10-CM

## 2023-05-06 DIAGNOSIS — I5033 Acute on chronic diastolic (congestive) heart failure: Secondary | ICD-10-CM | POA: Diagnosis not present

## 2023-05-06 DIAGNOSIS — E876 Hypokalemia: Secondary | ICD-10-CM | POA: Diagnosis present

## 2023-05-06 DIAGNOSIS — E872 Acidosis, unspecified: Secondary | ICD-10-CM | POA: Diagnosis present

## 2023-05-06 DIAGNOSIS — I071 Rheumatic tricuspid insufficiency: Secondary | ICD-10-CM | POA: Diagnosis present

## 2023-05-06 DIAGNOSIS — Y92009 Unspecified place in unspecified non-institutional (private) residence as the place of occurrence of the external cause: Secondary | ICD-10-CM

## 2023-05-06 DIAGNOSIS — I952 Hypotension due to drugs: Secondary | ICD-10-CM | POA: Diagnosis present

## 2023-05-06 DIAGNOSIS — F32A Depression, unspecified: Secondary | ICD-10-CM | POA: Diagnosis present

## 2023-05-06 DIAGNOSIS — I2510 Atherosclerotic heart disease of native coronary artery without angina pectoris: Secondary | ICD-10-CM | POA: Diagnosis not present

## 2023-05-06 DIAGNOSIS — I2781 Cor pulmonale (chronic): Secondary | ICD-10-CM | POA: Diagnosis present

## 2023-05-06 DIAGNOSIS — M064 Inflammatory polyarthropathy: Secondary | ICD-10-CM | POA: Diagnosis present

## 2023-05-06 DIAGNOSIS — Z833 Family history of diabetes mellitus: Secondary | ICD-10-CM

## 2023-05-06 DIAGNOSIS — Z818 Family history of other mental and behavioral disorders: Secondary | ICD-10-CM

## 2023-05-06 DIAGNOSIS — J90 Pleural effusion, not elsewhere classified: Secondary | ICD-10-CM | POA: Diagnosis not present

## 2023-05-06 DIAGNOSIS — E869 Volume depletion, unspecified: Secondary | ICD-10-CM | POA: Diagnosis not present

## 2023-05-06 DIAGNOSIS — K7200 Acute and subacute hepatic failure without coma: Secondary | ICD-10-CM | POA: Diagnosis not present

## 2023-05-06 DIAGNOSIS — R9389 Abnormal findings on diagnostic imaging of other specified body structures: Secondary | ICD-10-CM | POA: Diagnosis not present

## 2023-05-06 DIAGNOSIS — I130 Hypertensive heart and chronic kidney disease with heart failure and stage 1 through stage 4 chronic kidney disease, or unspecified chronic kidney disease: Secondary | ICD-10-CM | POA: Diagnosis not present

## 2023-05-06 DIAGNOSIS — F419 Anxiety disorder, unspecified: Secondary | ICD-10-CM | POA: Diagnosis not present

## 2023-05-06 DIAGNOSIS — I2721 Secondary pulmonary arterial hypertension: Secondary | ICD-10-CM | POA: Diagnosis present

## 2023-05-06 DIAGNOSIS — I495 Sick sinus syndrome: Secondary | ICD-10-CM | POA: Diagnosis not present

## 2023-05-06 DIAGNOSIS — J9621 Acute and chronic respiratory failure with hypoxia: Secondary | ICD-10-CM | POA: Diagnosis present

## 2023-05-06 DIAGNOSIS — Z85118 Personal history of other malignant neoplasm of bronchus and lung: Secondary | ICD-10-CM | POA: Diagnosis not present

## 2023-05-06 DIAGNOSIS — I4510 Unspecified right bundle-branch block: Secondary | ICD-10-CM | POA: Diagnosis present

## 2023-05-06 DIAGNOSIS — R570 Cardiogenic shock: Secondary | ICD-10-CM | POA: Diagnosis not present

## 2023-05-06 DIAGNOSIS — E785 Hyperlipidemia, unspecified: Secondary | ICD-10-CM | POA: Diagnosis present

## 2023-05-06 DIAGNOSIS — R001 Bradycardia, unspecified: Principal | ICD-10-CM | POA: Diagnosis present

## 2023-05-06 DIAGNOSIS — N179 Acute kidney failure, unspecified: Secondary | ICD-10-CM | POA: Diagnosis present

## 2023-05-06 DIAGNOSIS — I5082 Biventricular heart failure: Secondary | ICD-10-CM | POA: Diagnosis present

## 2023-05-06 DIAGNOSIS — K219 Gastro-esophageal reflux disease without esophagitis: Secondary | ICD-10-CM | POA: Diagnosis present

## 2023-05-06 DIAGNOSIS — Z90710 Acquired absence of both cervix and uterus: Secondary | ICD-10-CM

## 2023-05-06 DIAGNOSIS — T447X5A Adverse effect of beta-adrenoreceptor antagonists, initial encounter: Secondary | ICD-10-CM | POA: Diagnosis present

## 2023-05-06 DIAGNOSIS — J4489 Other specified chronic obstructive pulmonary disease: Secondary | ICD-10-CM | POA: Diagnosis present

## 2023-05-06 DIAGNOSIS — I959 Hypotension, unspecified: Secondary | ICD-10-CM | POA: Diagnosis not present

## 2023-05-06 DIAGNOSIS — Z885 Allergy status to narcotic agent status: Secondary | ICD-10-CM

## 2023-05-06 DIAGNOSIS — K761 Chronic passive congestion of liver: Secondary | ICD-10-CM | POA: Diagnosis present

## 2023-05-06 DIAGNOSIS — R918 Other nonspecific abnormal finding of lung field: Secondary | ICD-10-CM | POA: Diagnosis not present

## 2023-05-06 DIAGNOSIS — J9811 Atelectasis: Secondary | ICD-10-CM | POA: Diagnosis not present

## 2023-05-06 DIAGNOSIS — J9611 Chronic respiratory failure with hypoxia: Secondary | ICD-10-CM | POA: Diagnosis present

## 2023-05-06 DIAGNOSIS — I272 Pulmonary hypertension, unspecified: Secondary | ICD-10-CM | POA: Diagnosis not present

## 2023-05-06 DIAGNOSIS — N170 Acute kidney failure with tubular necrosis: Secondary | ICD-10-CM | POA: Diagnosis present

## 2023-05-06 DIAGNOSIS — M069 Rheumatoid arthritis, unspecified: Secondary | ICD-10-CM | POA: Diagnosis present

## 2023-05-06 DIAGNOSIS — R1011 Right upper quadrant pain: Secondary | ICD-10-CM | POA: Diagnosis not present

## 2023-05-06 DIAGNOSIS — I25110 Atherosclerotic heart disease of native coronary artery with unstable angina pectoris: Secondary | ICD-10-CM | POA: Diagnosis present

## 2023-05-06 DIAGNOSIS — D649 Anemia, unspecified: Secondary | ICD-10-CM | POA: Diagnosis not present

## 2023-05-06 DIAGNOSIS — K5900 Constipation, unspecified: Secondary | ICD-10-CM | POA: Diagnosis not present

## 2023-05-06 DIAGNOSIS — N1831 Chronic kidney disease, stage 3a: Secondary | ICD-10-CM | POA: Diagnosis present

## 2023-05-06 DIAGNOSIS — X58XXXA Exposure to other specified factors, initial encounter: Secondary | ICD-10-CM | POA: Diagnosis present

## 2023-05-06 DIAGNOSIS — Z452 Encounter for adjustment and management of vascular access device: Secondary | ICD-10-CM | POA: Diagnosis not present

## 2023-05-06 DIAGNOSIS — G4700 Insomnia, unspecified: Secondary | ICD-10-CM | POA: Diagnosis present

## 2023-05-06 DIAGNOSIS — R079 Chest pain, unspecified: Secondary | ICD-10-CM | POA: Diagnosis not present

## 2023-05-06 DIAGNOSIS — R55 Syncope and collapse: Secondary | ICD-10-CM | POA: Diagnosis not present

## 2023-05-06 DIAGNOSIS — Z853 Personal history of malignant neoplasm of breast: Secondary | ICD-10-CM

## 2023-05-06 DIAGNOSIS — M8448XA Pathological fracture, other site, initial encounter for fracture: Secondary | ICD-10-CM | POA: Diagnosis present

## 2023-05-06 DIAGNOSIS — Z87891 Personal history of nicotine dependence: Secondary | ICD-10-CM

## 2023-05-06 DIAGNOSIS — I2722 Pulmonary hypertension due to left heart disease: Secondary | ICD-10-CM | POA: Diagnosis not present

## 2023-05-06 DIAGNOSIS — I200 Unstable angina: Secondary | ICD-10-CM

## 2023-05-06 DIAGNOSIS — Z79899 Other long term (current) drug therapy: Secondary | ICD-10-CM

## 2023-05-06 DIAGNOSIS — J449 Chronic obstructive pulmonary disease, unspecified: Principal | ICD-10-CM

## 2023-05-06 DIAGNOSIS — Z9981 Dependence on supplemental oxygen: Secondary | ICD-10-CM

## 2023-05-06 DIAGNOSIS — C3412 Malignant neoplasm of upper lobe, left bronchus or lung: Secondary | ICD-10-CM | POA: Diagnosis present

## 2023-05-06 DIAGNOSIS — Z886 Allergy status to analgesic agent status: Secondary | ICD-10-CM

## 2023-05-06 DIAGNOSIS — I700 Atherosclerosis of aorta: Secondary | ICD-10-CM | POA: Diagnosis not present

## 2023-05-06 DIAGNOSIS — R002 Palpitations: Secondary | ICD-10-CM | POA: Diagnosis present

## 2023-05-06 DIAGNOSIS — Z8249 Family history of ischemic heart disease and other diseases of the circulatory system: Secondary | ICD-10-CM

## 2023-05-06 DIAGNOSIS — Z87820 Personal history of traumatic brain injury: Secondary | ICD-10-CM

## 2023-05-06 DIAGNOSIS — I4719 Other supraventricular tachycardia: Secondary | ICD-10-CM | POA: Diagnosis present

## 2023-05-06 DIAGNOSIS — R54 Age-related physical debility: Secondary | ICD-10-CM | POA: Diagnosis present

## 2023-05-06 DIAGNOSIS — K760 Fatty (change of) liver, not elsewhere classified: Secondary | ICD-10-CM | POA: Diagnosis not present

## 2023-05-06 DIAGNOSIS — R0602 Shortness of breath: Secondary | ICD-10-CM | POA: Diagnosis not present

## 2023-05-06 HISTORY — DX: Hypotension due to drugs: I95.2

## 2023-05-06 HISTORY — DX: Bradycardia, unspecified: R00.1

## 2023-05-06 LAB — TROPONIN I (HIGH SENSITIVITY)
Troponin I (High Sensitivity): 10 ng/L (ref <18)
Troponin I (High Sensitivity): 10 ng/L (ref <18)

## 2023-05-06 LAB — CBC WITH DIFFERENTIAL/PLATELET
Abs Immature Granulocytes: 0.03 10*3/uL (ref 0.00–0.07)
Basophils Absolute: 0 10*3/uL (ref 0.0–0.1)
Basophils Relative: 0 %
Eosinophils Absolute: 0 10*3/uL (ref 0.0–0.5)
Eosinophils Relative: 0 %
HCT: 39.7 % (ref 36.0–46.0)
Hemoglobin: 12.4 g/dL (ref 12.0–15.0)
Immature Granulocytes: 0 %
Lymphocytes Relative: 28 %
Lymphs Abs: 2.3 10*3/uL (ref 0.7–4.0)
MCH: 28.1 pg (ref 26.0–34.0)
MCHC: 31.2 g/dL (ref 30.0–36.0)
MCV: 90 fL (ref 80.0–100.0)
Monocytes Absolute: 0.5 10*3/uL (ref 0.1–1.0)
Monocytes Relative: 7 %
Neutro Abs: 5.2 10*3/uL (ref 1.7–7.7)
Neutrophils Relative %: 65 %
Platelets: 321 10*3/uL (ref 150–400)
RBC: 4.41 MIL/uL (ref 3.87–5.11)
RDW: 16.7 % — ABNORMAL HIGH (ref 11.5–15.5)
WBC: 8.1 10*3/uL (ref 4.0–10.5)
nRBC: 0 % (ref 0.0–0.2)

## 2023-05-06 LAB — T4, FREE: Free T4: 1.17 ng/dL — ABNORMAL HIGH (ref 0.61–1.12)

## 2023-05-06 LAB — BASIC METABOLIC PANEL
Anion gap: 15 (ref 5–15)
BUN: 18 mg/dL (ref 8–23)
CO2: 26 mmol/L (ref 22–32)
Calcium: 9.3 mg/dL (ref 8.9–10.3)
Chloride: 97 mmol/L — ABNORMAL LOW (ref 98–111)
Creatinine, Ser: 1.04 mg/dL — ABNORMAL HIGH (ref 0.44–1.00)
GFR, Estimated: 56 mL/min — ABNORMAL LOW (ref >60)
Glucose, Bld: 131 mg/dL — ABNORMAL HIGH (ref 70–99)
Potassium: 4.3 mmol/L (ref 3.5–5.1)
Sodium: 138 mmol/L (ref 135–145)

## 2023-05-06 LAB — TSH: TSH: 8.729 u[IU]/mL — ABNORMAL HIGH (ref 0.350–4.500)

## 2023-05-06 LAB — LACTIC ACID, PLASMA
Lactic Acid, Venous: 2 mmol/L (ref 0.5–1.9)
Lactic Acid, Venous: 2.4 mmol/L (ref 0.5–1.9)
Lactic Acid, Venous: 4.9 mmol/L (ref 0.5–1.9)

## 2023-05-06 LAB — D-DIMER, QUANTITATIVE: D-Dimer, Quant: 0.99 ug{FEU}/mL — ABNORMAL HIGH (ref 0.00–0.50)

## 2023-05-06 LAB — BRAIN NATRIURETIC PEPTIDE: B Natriuretic Peptide: 403.7 pg/mL — ABNORMAL HIGH (ref 0.0–100.0)

## 2023-05-06 MED ORDER — CALCIUM GLUCONATE-NACL 2-0.675 GM/100ML-% IV SOLN
2.0000 g | Freq: Once | INTRAVENOUS | Status: AC
  Administered 2023-05-07: 2000 mg via INTRAVENOUS
  Filled 2023-05-06: qty 100

## 2023-05-06 MED ORDER — IOHEXOL 350 MG/ML SOLN
75.0000 mL | Freq: Once | INTRAVENOUS | Status: AC | PRN
  Administered 2023-05-06: 75 mL via INTRAVENOUS

## 2023-05-06 MED ORDER — ONDANSETRON HCL 4 MG/2ML IJ SOLN
4.0000 mg | Freq: Once | INTRAMUSCULAR | Status: AC
  Administered 2023-05-06: 4 mg via INTRAVENOUS
  Filled 2023-05-06: qty 2

## 2023-05-06 MED ORDER — SODIUM CHLORIDE 0.9 % IV BOLUS
1000.0000 mL | Freq: Once | INTRAVENOUS | Status: AC
  Administered 2023-05-06: 1000 mL via INTRAVENOUS

## 2023-05-06 MED ORDER — DOCUSATE SODIUM 100 MG PO CAPS: 100.0000 mg | ORAL_CAPSULE | Freq: Two times a day (BID) | ORAL | Status: DC | PRN

## 2023-05-06 MED ORDER — CALCIUM GLUCONATE-NACL 1-0.675 GM/50ML-% IV SOLN
1.0000 g | INTRAVENOUS | Status: AC
  Administered 2023-05-06: 1000 mg via INTRAVENOUS
  Filled 2023-05-06: qty 50

## 2023-05-06 MED ORDER — LACTATED RINGERS IV BOLUS
1000.0000 mL | Freq: Once | INTRAVENOUS | Status: AC
  Administered 2023-05-06: 1000 mL via INTRAVENOUS

## 2023-05-06 MED ORDER — FLUOXETINE HCL 20 MG PO CAPS
20.0000 mg | ORAL_CAPSULE | Freq: Two times a day (BID) | ORAL | Status: DC
  Administered 2023-05-07 – 2023-05-14 (×14): 20 mg via ORAL
  Filled 2023-05-06 (×14): qty 1

## 2023-05-06 MED ORDER — CALCIUM GLUCONATE-NACL 1-0.675 GM/50ML-% IV SOLN
1.0000 g | INTRAVENOUS | Status: AC
  Administered 2023-05-07: 1000 mg via INTRAVENOUS
  Filled 2023-05-06: qty 50

## 2023-05-06 MED ORDER — HEPARIN SODIUM (PORCINE) 5000 UNIT/ML IJ SOLN
5000.0000 [IU] | Freq: Three times a day (TID) | INTRAMUSCULAR | Status: DC
  Administered 2023-05-07 – 2023-05-08 (×4): 5000 [IU] via SUBCUTANEOUS
  Filled 2023-05-06 (×4): qty 1

## 2023-05-06 MED ORDER — PANTOPRAZOLE SODIUM 40 MG PO TBEC
40.0000 mg | DELAYED_RELEASE_TABLET | Freq: Every day | ORAL | Status: DC
  Administered 2023-05-08 – 2023-05-14 (×7): 40 mg via ORAL
  Filled 2023-05-06 (×7): qty 1

## 2023-05-06 MED ORDER — DEXTROSE IN LACTATED RINGERS 5 % IV SOLN: INTRAVENOUS | Status: DC

## 2023-05-06 MED ORDER — ALBUTEROL SULFATE (2.5 MG/3ML) 0.083% IN NEBU
2.5000 mg | INHALATION_SOLUTION | Freq: Four times a day (QID) | RESPIRATORY_TRACT | Status: DC | PRN
  Administered 2023-05-07 – 2023-05-10 (×7): 2.5 mg via RESPIRATORY_TRACT
  Filled 2023-05-06 (×7): qty 3

## 2023-05-06 MED ORDER — POLYETHYLENE GLYCOL 3350 17 G PO PACK: 17.0000 g | PACK | Freq: Every day | ORAL | Status: DC | PRN

## 2023-05-06 MED ORDER — DOPAMINE-DEXTROSE 3.2-5 MG/ML-% IV SOLN
0.0000 ug/kg/min | INTRAVENOUS | Status: DC
  Administered 2023-05-06: 2 ug/kg/min via INTRAVENOUS
  Filled 2023-05-06 (×2): qty 250

## 2023-05-06 MED ORDER — SIMVASTATIN 20 MG PO TABS
40.0000 mg | ORAL_TABLET | Freq: Every day | ORAL | Status: DC
  Administered 2023-05-07: 40 mg via ORAL
  Filled 2023-05-06: qty 2

## 2023-05-06 NOTE — ED Notes (Signed)
6E progressive refused pt. Pt still symptomatic and bradycardic. Admitting MD notified of pt's status and vital signs.

## 2023-05-06 NOTE — ED Provider Notes (Signed)
[place] EMERGENCY DEPARTMENT AT [facility] Provider Note   CSN: [id number] Arrival date & time: 05/06/23  1322     History Chief Complaint  Patient presents with   Bradycardia   Angela Phillips is a 77 y.o. female w/ hx of CAD, HTN, angina, SVT, COPD, asthma, RA, hx of breast cancer, hx of lung cancer that p/w bradycardia, chest pain, and weakness.  Took 1 pill of metoprolol at 11:30 peior to CT scan at 1. This is a new medication, she's never taken it before. At CT scan, reports that she felt hot, nauseous, and threw up a few times. Reports generalized weakness. Feels SOB. Felt stabbing chest pain when she was throwing up, but this has since resolved. Pain was located in her Left breast. Denies LOC today. Denies fever.   Had heart beat feeling in her neck. During these episodes, she would have to lay down. Reports passing out due to this in the past. This is why she was sent to get heart imaging.   Per chart review: - Pt was sent to get CT coronary angio to workup potential anginal episodes by her Cardiologist. Pt was advised to take Lopressor 50mg  prior to getting her imaging done.         Home Medications Prior to Admission medications   Medication Sig Start Date End Date Taking? Authorizing Provider  albuterol (PROVENTIL) (2.5 MG/3ML) 0.083% nebulizer solution Take 2.5 mg by nebulization every 6 (six) hours as needed for wheezing or shortness of breath.    [provider]  albuterol (VENTOLIN HFA) 108 (90 Base) MCG/ACT inhaler Inhale 2 puffs into the lungs every 6 (six) hours as needed for wheezing or shortness of breath.    [provider]  celecoxib (CELEBREX) 200 MG capsule Take 200 mg by mouth 2 (two) times daily. 07/04/22   [provider]  clonazePAM (KLONOPIN) 1 MG tablet Take 1 mg by mouth every 6 (six) hours as needed for anxiety.    [provider]  diclofenac (VOLTAREN) 75 MG EC tablet Take 75 mg by mouth 2 (two)  times daily. 05/03/22   [provider]  diltiazem (CARDIZEM CD) 120 MG 24 hr capsule Take 120 mg by mouth daily. 04/14/23   [provider]  FLUoxetine (PROZAC) 20 MG capsule Take 20 mg by mouth 2 (two) times daily.    [provider]  gabapentin (NEURONTIN) 300 MG capsule Take 300 mg by mouth at bedtime.    [provider]  metoprolol tartrate (LOPRESSOR) 50 MG tablet Take 50 mg Lopressor 2 hours prior to your CT scan for a heart rate greater than 55. 04/30/23   Revankar, Aundra Dubin, MD  Multiple Vitamin (MULTI-VITAMINS) TABS Take 1 tablet by mouth daily. Centrum silver    [provider]  omeprazole (PRILOSEC) 40 MG capsule Take 40 mg by mouth daily. 08/13/20   [provider]  simvastatin (ZOCOR) 40 MG tablet Take 40 mg by mouth at bedtime.    [provider]      Allergies    Aspirin, Atorvastatin, Buspirone, Celecoxib, Chlorpheniramine, Citalopram, Ezetimibe, Ezetimibe-simvastatin, Hydrocodone-acetaminophen, Prochlorperazine, Rosuvastatin, Statins, and Venlafaxine    Review of Systems   Review of Systems  Physical Exam Updated Vital Signs BP 124/68   Pulse (!) 41   Temp (!) 97.5 F (36.4 C) (Oral)   Resp 15   SpO2 100%  Physical Exam General: Alert, uncomfortable appearing woman. Speaking in full sentences on RA and responding appropriately.  HEENT: NCAT. MMM. CV: RRR, no murmurs. 2+ Dorsalis pedis pulses BL.  Resp: CTAB, no wheezing or crackles. Normal WOB on RA.  Abm: Soft, nontender, nondistended. BS present. Ext: Moves all ext spontaneously Skin: Warm, well perfused   ED Results / Procedures / Treatments   Labs (all labs ordered are listed, but only abnormal results are displayed) Labs Reviewed  CBC WITH DIFFERENTIAL/PLATELET - Abnormal; Notable for the following components:      Result Value   RDW 16.7 (*)    All other components within normal limits  BASIC METABOLIC PANEL - Abnormal; Notable for the following  components:   Chloride 97 (*)    Glucose, Bld 131 (*)    Creatinine, Ser 1.04 (*)    GFR, Estimated 56 (*)    All other components within normal limits  LACTIC ACID, PLASMA - Abnormal; Notable for the following components:   Lactic Acid, Venous 2.0 (*)    All other components within normal limits  TSH - Abnormal; Notable for the following components:   TSH 8.729 (*)    All other components within normal limits  T4, FREE - Abnormal; Notable for the following components:   Free T4 1.17 (*)    All other components within normal limits  D-DIMER, QUANTITATIVE - Abnormal; Notable for the following components:   D-Dimer, Quant 0.99 (*)    All other components within normal limits  BRAIN NATRIURETIC PEPTIDE - Abnormal; Notable for the following components:   B Natriuretic Peptide 403.7 (*)    All other components within normal limits  LACTIC ACID, PLASMA  TROPONIN I (HIGH SENSITIVITY)  TROPONIN I (HIGH SENSITIVITY)    EKG EKG Interpretation Date/Time:  Tuesday May 06 2023 14:02:20 EDT Ventricular Rate:  42 PR Interval:  116 QRS Duration:  133 QT Interval:  538 QTC Calculation: 450 R Axis:   68  Text Interpretation: Sinus bradycardia Borderline short PR interval Right bundle branch block Confirmed by Alvino Blood ([id number]) on 05/06/2023 3:58:57 PM  Radiology DG Chest Portable 1 View  Result Date: 05/06/2023 CLINICAL DATA:  Hypotension EXAM: PORTABLE CHEST 1 VIEW COMPARISON:  02/03/2023 FINDINGS: Stable abnormal density above and along the aortic arch attributable to the band of chronically atelectatic left upper lobe shown on multiple prior chest CT examinations, probably therapy related, correlate with patient history. Mild volume loss in the left hemithorax with mild elevation of left hemidiaphragm. Band of right infrahilar density compatible with known right lower lobe chronically atelectatic lung. No new opacities. Atherosclerotic calcification of the aortic arch. Mild lower  thoracic spondylosis. IMPRESSION: 1. Chronically atelectatic portions of the left upper lobe and right lower lobe as on prior CT scans. No new opacities. 2. Mild lower thoracic spondylosis. 3.  Aortic Atherosclerosis (ICD10-I70.0). Electronically Signed   By: Gaylyn Rong M.D.   On: 05/06/2023 15:57    Procedures Procedures   Medications Ordered in ED Medications  lactated ringers bolus 1,000 mL (0 mLs Intravenous Stopped 05/06/23 1530)    ED Course/ Medical Decision Making/ A&P                               Medical Decision Making:   VERNETTA ROEHRIG is a 77 y.o. female who presented to the ED today with bradycardia detailed above.     Complete initial physical exam performed, notably the patient was bradycardic with regular rhythm.    Reviewed and confirmed nursing documentation for past  medical history, family history, social history.    Initial Assessment:   With the patient's presentation of bradycardia and dyspnea, most likely diagnosis is metoprolol toxicity. Other diagnoses were considered including (but not limited to) ACS, arrhythmia, PE, neurogenic shock, anemia, tamponade, hypothyroidism. These are considered less likely due to history of present illness and physical exam findings.   This is most consistent with an acute complicated illness  Initial Plan:  BNP, D dimer, lactic acid, TSH, T4, troponin  1L LR bolus Screening labs including CBC and Metabolic panel to evaluate for infectious or metabolic etiology of disease.  CXR to evaluate for structural/infectious intrathoracic pathology.  EKG to evaluate for cardiac pathology Objective evaluation as below reviewed   Initial Study Results:   Laboratory  All laboratory results reviewed without evidence of clinically relevant pathology.   Exceptions include:  - lactic acid 2.0 (mildly elevated) - ddimer 0.99 - BNP elevated - TSH elevated  EKG EKG was reviewed independently. Rate, rhythm, axis, intervals all  examined and without medically relevant abnormality. ST segments without concerns for elevations.   - RBBB, which is present on prior EKG - Notable for sinus brady  Radiology:  All images reviewed independently. Agree with radiology report at this time.   DG Chest Portable 1 View  Result Date: 05/06/2023 CLINICAL DATA:  Hypotension EXAM: PORTABLE CHEST 1 VIEW COMPARISON:  02/03/2023 FINDINGS: Stable abnormal density above and along the aortic arch attributable to the band of chronically atelectatic left upper lobe shown on multiple prior chest CT examinations, probably therapy related, correlate with patient history. Mild volume loss in the left hemithorax with mild elevation of left hemidiaphragm. Band of right infrahilar density compatible with known right lower lobe chronically atelectatic lung. No new opacities. Atherosclerotic calcification of the aortic arch. Mild lower thoracic spondylosis. IMPRESSION: 1. Chronically atelectatic portions of the left upper lobe and right lower lobe as on prior CT scans. No new opacities. 2. Mild lower thoracic spondylosis. 3.  Aortic Atherosclerosis (ICD10-I70.0). Electronically Signed   By: Gaylyn Rong M.D.   On: 05/06/2023 15:57    Consults: Consult to Cardiology. They plan to come evaluate patient.   Reassessment and Plan:   - Reassessed at 1430. Pt reports that nausea and SOB are both improved s/p 1L LR bolus. HR was 40. BP 111/62.  - Ddimer elevated. Given chest pain and SOB, will get CTA PE for further workup.  - 1550: Pt satting 98% but reports that she wears O2 at home. Per chart review, pt is on chronic home O2 for COPD. Pt was placed on 2L [place] for comfort, but was not truly hypoxic.    Final Clinical Impression(s) / ED Diagnoses Final diagnoses:  Bradycardia  Near syncope    Rx / DC Orders ED Discharge Orders          Ordered    CT Angio Chest Pulmonary Embolism (PE) W or WO Contrast        05/06/23 1504              Lincoln Brigham, MD 05/06/23 1618    Ernie Avena, MD 05/07/23 1417

## 2023-05-06 NOTE — ED Provider Notes (Signed)
    ED Course / MDM   Clinical Course as of 05/06/23 2031  Tue May 06, 2023  2030 Received signout from prior team pending CTA chest and cardiology consult.  Cardiology agrees the patient should be admitted since her ischemic workup is not able to be completed since she is continue to have symptoms of chest pain.  Family medicine will admit patient. [WS]    Clinical Course User Index [WS] Lonell Grandchild, MD   Medical Decision Making Amount and/or Complexity of Data Reviewed Labs: ordered. Radiology: ordered.  Risk Prescription drug management. Decision regarding hospitalization.          Lonell Grandchild, MD 05/06/23 2031

## 2023-05-06 NOTE — Consult Note (Addendum)
Cardiology Consultation   Patient ID: JULI Phillips MRN: [id number]; DOB: 02-28-1946  Admit date: 05/06/2023 Date of Consult: 05/06/2023  PCP:  Hurshel Party, NP   [facility] Providers Cardiologist: Revenkar  Patient Profile:   Angela Phillips is a 77 y.o. female with a hx of CAD (on CT scan)   who is being seen 05/06/2023 for the evaluation of bradycardia  at the request of Dr Suezanne Jacquet .  History of Present Illness:   Ms. Angela Phillips is a 77 yo woman whe is followed by R Ravenkar.  Seen recently (04/30/23) in clinic        The pt says over the past few wks she has noticed episodes of heart racing, feels it in her neck  She says after this starts she will get chest pressure Discomfort will radiate into  L arm   Severe discomfort.   One time passed out due to severity , , fell on sofa     Episodes occur with and without  acitivty  She says always the racing first and then the pressure   Pressure will ease when racing stops   Initially occurred a couple times per day  Now 5 or 6x per day  Can last seconds then up to 20 min   Spells associated with SOB  WIll sometimes wake her from sleep    The pt hs noticed some DOE over the past several months She has also  noted some LE edema    She has occasional PND which is not new    She Wears 2L O2 at night     Seen by R Revenkar.  Set up for Coronary CTA today  Took metoprolol on way in   Got to XRay  Became weak, nauseated   Vomited   Broke out into a sweat.  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   RLL,  clinical dx, treated with SBRT     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, 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, treated with SBRT  Clinical stage I     COPD (chronic obstructive pulmonary disease) (HCC)    Dyslipidemia (high LDL; low HDL) 08/13/2016   Dysuria 40/98/1191   Essential hypertension 07/05/2016   Falls 11/20/2016   H/O ultrasound guided needle biopsy of lung 08/24/2020   History of radiation therapy 10/24/2020-11/06/2020   SBRT to left and right lungs   Dr Antony Blackbird   HTN (hypertension) 11/20/2016   Hyperlipidemia 04/29/2023   Hypokalemia 07/23/2021   Impaired mobility and ADLs 10/28/2017   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   Osteoporosis 07/12/2022   Palpitations 07/05/2016   Paroxysmal SVT (supraventricular tachycardia) 07/05/2016   Personal history of malignant neoplasm of breast 04/29/2023   Pneumothorax on right 08/24/2020   Rheumatoid arthritis (HCC) 04/29/2023   Subdural hematoma (HCC) 11/20/2016   SVT (  supraventricular tachycardia) 10/19/2018    Past Surgical History:  Procedure Laterality Date   ABDOMINAL HYSTERECTOMY     CARDIAC CATHETERIZATION     CARPAL TUNNEL RELEASE     CATARACT EXTRACTION     CHOLECYSTECTOMY     HEMORROIDECTOMY     JOINT REPLACEMENT     R knee   ORIF left elbow Left 12/2021   SVT ABLATION N/A 10/19/2018   Procedure: SVT ABLATION;  Surgeon: Marinus Maw, MD;  Location: [facility];  Service: Cardiovascular;  Laterality: N/A;   TOE SURGERY     VIDEO BRONCHOSCOPY WITH ENDOBRONCHIAL NAVIGATION  08/24/2020   VIDEO BRONCHOSCOPY WITH ENDOBRONCHIAL NAVIGATION N/A 08/24/2020   Procedure: VIDEO BRONCHOSCOPY WITH ENDOBRONCHIAL NAVIGATION;  Surgeon: Loreli Slot, MD;  Location: [facility] OR;  Service: Thoracic;  Laterality: N/A;     Home Medications:  Prior to Admission medications   Medication Sig Start Date End Date Taking? Authorizing Provider   albuterol (PROVENTIL) (2.5 MG/3ML) 0.083% nebulizer solution Take 2.5 mg by nebulization every 6 (six) hours as needed for wheezing or shortness of breath.    [provider]  albuterol (VENTOLIN HFA) 108 (90 Base) MCG/ACT inhaler Inhale 2 puffs into the lungs every 6 (six) hours as needed for wheezing or shortness of breath.    [provider]  celecoxib (CELEBREX) 200 MG capsule Take 200 mg by mouth 2 (two) times daily. 07/04/22   [provider]  clonazePAM (KLONOPIN) 1 MG tablet Take 1 mg by mouth every 6 (six) hours as needed for anxiety.    [provider]  diclofenac (VOLTAREN) 75 MG EC tablet Take 75 mg by mouth 2 (two) times daily. 05/03/22   [provider]  diltiazem (CARDIZEM CD) 120 MG 24 hr capsule Take 120 mg by mouth daily. 04/14/23   [provider]  FLUoxetine (PROZAC) 20 MG capsule Take 20 mg by mouth 2 (two) times daily.    [provider]  gabapentin (NEURONTIN) 300 MG capsule Take 300 mg by mouth at bedtime.    [provider]  metoprolol tartrate (LOPRESSOR) 50 MG tablet Take 50 mg Lopressor 2 hours prior to your CT scan for a heart rate greater than 55. 04/30/23   Revankar, Aundra Dubin, MD  Multiple Vitamin (MULTI-VITAMINS) TABS Take 1 tablet by mouth daily. Centrum silver    [provider]  omeprazole (PRILOSEC) 40 MG capsule Take 40 mg by mouth daily. 08/13/20   [provider]  simvastatin (ZOCOR) 40 MG tablet Take 40 mg by mouth at bedtime.    [provider]      Allergies:    Allergies  Allergen Reactions   Aspirin Other (See Comments)    Bleeding (intolerance)   Atorvastatin     Other Reaction(s): Unknown   Buspirone Other (See Comments)    Patient does not recall taking this med   Celecoxib Other (See Comments)    Patient cannot remember reaction   Chlorpheniramine Other (See Comments)    Patient cannot remember reaction   Citalopram Other (See Comments)     Patient cannot remember reaction   Ezetimibe Other (See Comments)    Myalgias (intolerance)   Ezetimibe-Simvastatin     Other Reaction(s): Unknown   Hydrocodone-Acetaminophen Itching   Prochlorperazine Other (See Comments)    "Makes me feel like I'm having a face stroke"   Rosuvastatin     Other Reaction(s): Unknown   Statins Other (See Comments)    Myalgias (intolerance)  Venlafaxine Other (See Comments)    Patient cannot remember reaction    Social History:   Social History   Socioeconomic History   Marital status: Married    Spouse name: Not on file   Number of children: Not on file   Years of education: Not on file   Highest education level: Not on file  Occupational History   Not on file  Tobacco Use   Smoking status: Former    Current packs/day: 0.50    Average packs/day: 0.5 packs/day for 15.0 years (7.5 ttl pk-yrs)    Types: Cigarettes   Smokeless tobacco: Former    Quit date: 09/09/1989  Vaping Use   Vaping status: Never Used  Substance and Sexual Activity   Alcohol use: No   Drug use: No   Sexual activity: Not on file  Other Topics Concern   Not on file  Social History Narrative   Not on file   Social Determinants of Health   Financial Resource Strain: Low Risk  (08/20/2022)   Overall Financial Resource Strain (CARDIA)    Difficulty of Paying Living Expenses: Not very hard  Food Insecurity: No Food Insecurity (08/20/2022)   Hunger Vital Sign    Worried About Running Out of Food in the Last Year: Never true    Ran Out of Food in the Last Year: Never true  Transportation Needs: No Transportation Needs (08/20/2022)   PRAPARE - Administrator, Civil Service (Medical): No    Lack of Transportation (Non-Medical): No  Physical Activity: Inactive (08/20/2022)   Exercise Vital Sign    Days of Exercise per Week: 0 days    Minutes of Exercise per Session: 0 min  Stress: Stress Concern Present (08/20/2022)   [employer] of Occupational  Health - Occupational Stress Questionnaire    Feeling of Stress : To some extent  Social Connections: Moderately Isolated (08/20/2022)   Social Connection and Isolation Panel [NHANES]    Frequency of Communication with Friends and Family: More than three times a week    Frequency of Social Gatherings with Friends and Family: More than three times a week    Attends Religious Services: Never    Database administrator or Organizations: No    Attends Banker Meetings: Never    Marital Status: Married  Catering manager Violence: Not At Risk (08/20/2022)   Humiliation, Afraid, Rape, and Kick questionnaire    Fear of Current or Ex-Partner: No    Emotionally Abused: No    Physically Abused: No    Sexually Abused: No    Family History:    Family History  Problem Relation Age of Onset   Thyroid disease Brother    Dementia Father    Heart disease Mother    Diabetes Mother    Thyroid disease Sister    2 brothers with CAD   ROS:  Please see the history of present illness.   All other ROS reviewed and negative.     Physical Exam/Data:   Vitals:   05/06/23 1445 05/06/23 1530 05/06/23 1600 05/06/23 1615  BP: 113/65 125/62 124/68 124/62  Pulse: (!) 41 (!) 39 (!) 41 (!) 37  Resp: (!) 23 15 15  (!) 21  Temp:      TempSrc:      SpO2: 98% 98% 100% 100%   No intake or output data in the 24 hours ending 05/06/23 1729    04/30/2023    1:08 PM 04/15/2023    3:22  PM 03/28/2023    3:27 PM  Last 3 Weights  Weight (lbs) 118 lb 12.8 oz 118 lb 119 lb 1.6 oz  Weight (kg) 53.887 kg 53.524 kg 54.023 kg     There is no height or weight on file to calculate BMI.  General:  Well nourished, well developed, in no acute distress  Denies dizziness  HEENT: normal Neck: no JVD   Vascular: No carotid bruits; Distal pulses 2+ bilaterally Cardiac:  normal S1, S2; RRR; no murmur  Lungs:  Moving air  No rales or wheezes    Abd: soft, nontender, no hepatomegaly  Ext: No LE   edema Musculoskeletal:  No deformities, BUE and BLE strength normal and equal Skin: warm and dry  Neuro:  CNs 2-12 intact, no focal abnormalities noted Psych:  Normal affect   EKG:  The EKG was personally reviewed and demonstrates:   Telemetry:  Telemetry was personally reviewed and demonstrates:  Junctional rhythm   37 bpm   Narrow QRS   Relevant CV Studies:  Echo   June 2024  1. Left ventricular ejection fraction, by estimation, is 60 to 65%. The  left ventricle has normal function. The left ventricle has no regional  wall motion abnormalities. Left ventricular diastolic parameters are  consistent with Grade I diastolic  dysfunction (impaired relaxation). GLS-19.4%   2. Right ventricular systolic function is normal. The right ventricular  size is severely enlarged. There is normal pulmonary artery systolic  pressure.   3. Right atrial size was severely dilated.   4. The mitral valve is normal in structure. No evidence of mitral valve  regurgitation. No evidence of mitral stenosis.   5. Tricuspid valve regurgitation is moderate.   6. The aortic valve is normal in structure. Aortic valve regurgitation is  mild. No aortic stenosis is present.   7. The inferior vena cava is normal in size with greater than 50%  respiratory variability, suggesting right atrial pressure of 3 mmHg.    Monitor June 2024    The dominant rhythm is normal sinus rhythm with normal circadian variation.   There are rare premature atrial and premature ventricular beats.  There is a single 11-beat run of nonsustained supraventricular tachycardia, most likely ectopic atrial tachycardia.   Premature ventricular contractions there is no evidence of complex ventricular arrhythmia.   There is no evidence of significant bradycardia or pauses.   There are rare blocked premature atrial contractions, followed by junctional escape beats.   Several symptom-triggered recordings are submitted, but these show normal sinus  rhythm.   Essentially normal arrhythmia monitor. There is no correlation between patient's symptoms and any rhythm abnormalities. \\ Myoview 2021  The left ventricular ejection fraction is hyperdynamic (>65%). This is a low risk study. The study is normal.   Laboratory Data:  High Sensitivity Troponin:   Recent Labs  Lab 04/15/23 1627 05/06/23 1354 05/06/23 1528  TROPONINIHS 9 10 10      Chemistry Recent Labs  Lab 04/30/23 1349 05/06/23 1354  NA 142 138  K 4.4 4.3  CL 103 97*  CO2 26 26  GLUCOSE 88 131*  BUN 14 18  CREATININE 0.88 1.04*  CALCIUM 9.4 9.3  GFRNONAA  --  56*  ANIONGAP  --  15    No results for input(s): "PROT", "ALBUMIN", "AST", "ALT", "ALKPHOS", "BILITOT" in the last 168 hours. Lipids No results for input(s): "CHOL", "TRIG", "HDL", "LABVLDL", "LDLCALC", "CHOLHDL" in the last 168 hours.  Hematology Recent Labs  Lab 05/06/23 1354  WBC 8.1  RBC 4.41  HGB 12.4  HCT 39.7  MCV 90.0  MCH 28.1  MCHC 31.2  RDW 16.7*  PLT 321   Thyroid  Recent Labs  Lab 05/06/23 1354  TSH 8.729*  FREET4 1.17*    BNP Recent Labs  Lab 05/06/23 1354  BNP 403.7*    DDimer  Recent Labs  Lab 05/06/23 1354  DDIMER 0.99*     Radiology/Studies:  DG Chest Portable 1 View  Result Date: 05/06/2023 CLINICAL DATA:  Hypotension EXAM: PORTABLE CHEST 1 VIEW COMPARISON:  02/03/2023 FINDINGS: Stable abnormal density above and along the aortic arch attributable to the band of chronically atelectatic left upper lobe shown on multiple prior chest CT examinations, probably therapy related, correlate with patient history. Mild volume loss in the left hemithorax with mild elevation of left hemidiaphragm. Band of right infrahilar density compatible with known right lower lobe chronically atelectatic lung. No new opacities. Atherosclerotic calcification of the aortic arch. Mild lower thoracic spondylosis. IMPRESSION: 1. Chronically atelectatic portions of the left upper lobe and  right lower lobe as on prior CT scans. No new opacities. 2. Mild lower thoracic spondylosis. 3.  Aortic Atherosclerosis (ICD10-I70.0). Electronically Signed   By: Gaylyn Rong M.D.   On: 05/06/2023 15:57     Assessment and Plan:   1  Bradycardia   Profound response to metoprolol  Hx sugg of vagal component as well earlier      At home she is on diltiazem but she did not take any of her other meds today     Currently in junctional bradycardia with rel narrow QRS   SBP 90s to 100s    Follow as medicine washes out of system   Do not give dilt  Keep NPO after MN     2  Chest pain   Hx she gives today is that pain is always after she feels heart racing in neck   Severe     CT in past did shows CAD  Tachycardia could exacerbate ischemia     Note that she also has noted increased DOE Recomm:  Once HR recovers would recomm R and L heart cath to define anatomy and heart pressures (Echo with enlarged RV   May be from COPD, need to r/o pulmnary HTN)  3   Tachycardia  Hx of SVT in past , s/p ablation (remote)  Keep on telemetry to monitor for arrhythmias  4  CAD  As noted above  5  HL  Check in am  reportedly taking simvistatin   Intolerant to other  statins   For questions or updates, please contact [facility] Please consult [email or web address] for contact info under    Signed, Dietrich Pates, MD  05/06/2023 5:29 PM

## 2023-05-06 NOTE — H&P (Cosign Needed Addendum)
Hospital Admission History and Physical Service Pager: [phone]  Patient name: Angela Phillips Medical record number: [id number] Date of Birth: Apr 27, 1946 Age: 77 y.o. Gender: female  Primary Care Provider: Hurshel Party, NP Consultants: Cardiology Code Status: FULL, which was confirmed with family present at bedside  Preferred Emergency Contact: Contact Information     Name Relation Home Work Mobile   Ancrum,HOWARD Spouse   [phone]      Other Contacts   None on File    Chief Complaint: Weakness, chest pain  Assessment and Plan: TYKIERA COMMANDER is a 77 y.o. female presenting with bradycardia, nausea, weakness, and chest pain. Differential for this patient's presentation includes symptomatic bradycardia 2/2 beta-blocker administration with possible initial vagal response vs ischemia-mediated bradycardia.  Suspect this is multifactorial, exacerbated by excessive nodal blockade given metoprolol administration on top of her home diltiazem.Also likely a component of underlying ischemic disease given history of SVT and chest pain, with ongoing workup per cardiology.  Also considered MI and PE, but ruled out by normal troponins with lack of ischemic changes on EKG and CTA chest, respectively.  Also considered possible CHF given history of SVT, but did not appear volume overloaded on exam and without signs of fluid overload and CXR. We are now 8 hours our from administration of metoprolol tartrate, half-life 3-7 hours. Anticipate she should be working her way toward metabolizing this out of her system.  Assessment & Plan Bradycardia Symptomatic bradycardia in setting of beta-blocker administration.  Continues to be bradycardic at this time with intermittent sinus brady and  junctional escape rhythm.  Expect improvement as metoprolol continues to be metabolized. -Admit to FMTS, progressive care, attending Dr. Terisa Starr -Continuous cardiac and SpO2 monitoring -Avoid beta-blockers  and diltiazem, holding these home meds -Status post 1 L bolus x 2, monitor BP closely -Compazine as needed for nausea -AM BMP and mag, goal K>4, mag>2 -Cardiology consulted, appreciate recs: -N.p.o. at midnight for left and right cath tomorrow -Do not see a benefit to glucagon administration given time since metoprolol administration -High risk for needing higher level of care for inotropic/chronotropic support. Monitor closely for now.   Near syncope Suspect related to SVT outpatient, currently likely secondary to hypotension and bradycardia. -Workup per cardiology, appreciate recs Anxiety Understandably anxious in the setting of her current situation. -Hold clonazepam while experiencing symptomatic bradycardia with near syncope, restart in a.m. if improved due to concern for withdrawal  Chronic and Stable: -COPD, asthma: Albuterol neb Q6h PRN and 2 L O2 -GERD: Pantoprazole 40 mg daily -Depression: Paroxetine 20 mg twice daily -HLD: Simvastatin 40 mg daily  FEN/GI: N.p.o. tonight, saline lock PIV VTE Prophylaxis: Heparin every 8 hours  Disposition: Progressive  History of Present Illness: Angela Phillips is a 77 y.o. female with a pertinent PMH of CAD, HTN, SVT, COPD on 2 L, history of lung cancer, HLD presenting with bradycardia, chest pain, weakness.  Today, patient presented to outpatient cardiologist Revenkar for coronary CTA to workup some potential anginal episodes, received 1x metoprolol 50 mg prior to procedure at 11:30 AM.  Became weak and nauseated, experienced emesis x 1 and diaphoresis.  Significantly bradycardic and was sent to ED.  On ED presentation, patient was bradycardic at ~40 bpm with nausea and shortness of breath.  BP 124/68.  EKG showing sinus bradycardia with RBBB.  Lactate mildly elevated to 2, BNP, TSH, and T4 elevated.  D-dimer elevated to 0.99.  CTA PE ordered, showed no evidence of pulmonary  embolus.  Received 1 L LR bolus, placed on 2 L [place] for comfort  but had been saturating well on room air prior (chronically on 2 L at home for COPD).  Cardiology consulted and elected to proceed with right and left catheterization tomorrow once bradycardia proves to define anatomy and heart pressures.  FMTS consulted for admission for symptomatic bradycardia.  When evaluated for admission, patient reported that she was feeling weak and lightheaded, transfer to wheelchair to go to restroom and stated she felt dizzy at the time.  Endorse some shortness of breath, continuing nausea, repeatedly states she is feeling sweaty and unwell.  Denies chest pain.  Reports that she has been having episodes of palpitations during which she can feel her heart beating in her neck, set to lie down during these episodes and has had concurrent syncope in the past.  Was undergoing workup for this issue when she received the beta-blocker.  Review Of Systems: Per HPI.  Pertinent Past Medical History: -COPD, asthma on 2 L chronically -Essential hypertension -SVT, palpitations -HLD -Cancer of LUL and RLL, treated with SBRT -Rheumatoid arthritis -CAD  Remainder reviewed in history tab.   Pertinent Past Surgical History: -SVT ablation in 2020  Remainder reviewed in history tab.  Pertinent Social History: Tobacco use: Former smoker, approximately 7.5 pack years on chart review Alcohol use: Socially Other Substance use: No  Pertinent Family History: -Thyroid disease in sister and brother -Heart disease in mother  Remainder reviewed in history tab.   Important Outpatient Medications: -Albuterol neb every 6 as needed -Clonazepam 1 mg every 6 hours as needed -Celecoxib 200 mg twice daily -Diclofenac 75 mg twice daily -Omeprazole 40 mg daily -Simvastatin 40 mg nightly -Fluoxetine 20 mg twice daily -Gabapentin 300 mg nightly -Diltiazem XR 120 mg daily -One-time dose of metoprolol tartrate 50 mg  Remainder reviewed in medication history.  Objective: BP (!) 88/53    Pulse (!) 36   Temp 97.8 F (36.6 C)   Resp 18   SpO2 100%   Physical Exam: General: Early female, frail, resting comfortably in bed appears in some distress. Eyes:  PERRLA (3-4 mm). ENTM: Moderately dry mucous membranes. Neck: No with adenopathy. Cardiovascular: Bradycardic, regular rhythm.  Normal S1/2.  No murmurs/rubs/gallops. Respiratory: Normal work of breathing on 2 L [place].  CTAB.  No wheezing. Gastrointestinal: Tenderness to palpation. MSK: Moving all extremities appropriately. Extremities: Cap refill 2 to 3 seconds.  DP and PT pulses 2+ bilaterally.  Well-perfused. Derm: No rashes grossly.  Some old tattoos on lower extremities bilaterally. Neuro: Alert and oriented x4.  No focal neurological deficit. Psych: Pleasant, converses appropriately but distressed and anxious.  Labs:  CBC BMET  Recent Labs  Lab 05/06/23 1354  WBC 8.1  HGB 12.4  HCT 39.7  PLT 321   Recent Labs  Lab 05/06/23 1354  NA 138  K 4.3  CL 97*  CO2 26  BUN 18  CREATININE 1.04*  GLUCOSE 131*  CALCIUM 9.3     Pertinent additional labs: -Troponin 9>10 -Lactate 2.0>2.4 -D-dimer 0.99 -TSH 8.729, T41.17 -BNP 403.7  Initial admission EKG: Sinus bradycardia, RBBB, normal axis, PRWP, no ischemic changes. Repeat EKG: Junctional rhythm, bradycardia, RBBB, some nonspecific T wave inversions, no ischemic changes, QTc 491  Imaging Studies Performed: -CTA chest: No evidence of PE, trace left pleural effusion, CAD, stable areas of linear consolidation in left upper and right lower lobes consistent with therapeutic changes status post lung cancer treatment. -CXR: No focal consolidation, interstitial fluid.  Chronic atelectatic portions of left upper lung right lower lobe.  Independently reviewed and agree with radiologist interpretation.   Dimitry Sharion Dove, MD 05/06/2023, 7:14 PM [facility]  FPTS Intern pager: [phone], text pages welcome Secure chat group [facility] Teaching Service    I have evaluated this patient along with Dr. Sharion Dove and reviewed the above note, making necessary revisions.  Dorothyann Gibbs, MD 05/06/2023, 10:54 PM PGY-3, [facility]

## 2023-05-06 NOTE — Progress Notes (Signed)
Patient ID: Angela Phillips, female   DOB: 11-Sep-1945, 77 y.o.   MRN: [id number] Pt. Here for ct heart c/o nausea and vomiting feeling hot and dizziness feel like passing out. :see vitals in flow sheets  Dr. Mayford Knife called advised pt to go to Emergency room pt taken to er

## 2023-05-06 NOTE — Consult Note (Addendum)
NAME:  Angela Phillips, MRN:  [id number], DOB:  1946-02-27, LOS: 0 ADMISSION DATE:  05/06/2023, CONSULTATION DATE:  8/27 REFERRING MD:  [place], CHIEF COMPLAINT:  symptomatic bradycardia    History of Present Illness:  This is a 77 year old female patient with a fairly extensive history as outlined below.  Presented to the emergency room on 8/27 with chief complaint of chest pain, dizziness and nausea.  More recently had been seen by cardiology on 8/21 in clinic reporting frequent episodes of feeling as though her heart was racing, felt it up in her neck this was associated with chest pressure and left arm radiation.  1 of these episodes was also consistent with a syncopal event where she passed out and fell on her sofa.  These were occurring 5-6 times a day, at times are paroxysmal and wake her from sleep.  She has noticed worsening associated exertional dyspnea and lower extremity swelling she typically wears oxygen at night.  Prior echocardiogram in June 2024 showed normal LVEF, had severe RV dilation but normal RVEF the RA was dilated and had moderate TR.  She had been referred for a CT angiogram by cardiology, apparently took her metoprolol on way.  She became weak, nauseated, vomiting, diaphoretic.  Had chest pain and dizziness so was referred to the emergency room where she was found to be bradycardic and hypotensive.  This was apparently the first time she had ever taken her Lopressor (took 50mg  at 1130 CCM seeing at 2230).  She was seen in the emergency room Initially by emergency room services and family practice teaching service.  Felt to have had a profound response from her metoprolol but also perhaps a vagal component as well.  Therapeutic interventions to date have included placing her in Trendelenburg, and providing IV fluids however in spite of this she has remained hypotensive so the critical care service was asked to evaluate with concern she might need vasoactive drip support  ER  evaluation:  White blood cell count normal serum creatinine 1.04 lactate 2.0 TSH 8.73 Free T4: 1.167, BNP 404 initial troponin 10, this stayed at 10.  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 HR 30s. Had taken 50mg  metoprolol for first time at 1130 earlier that day. Remained bradycardic at 2230 after fluid challenge  Interim History / Subjective:  Has some mild resting SOB. No current CP   Objective   Blood pressure (!) 93/51, pulse (!) 33, temperature 97.8 F (36.6 C), resp. rate 16, SpO2 94%.       No intake or output data in the 24 hours ending 05/06/23 2202 There were no vitals filed for this visit.  Examination: General: awake, calm, not in distress  HENT: MMM no JVD  Lungs: clear  Cardiovascular: bradycardic junctional rhythm on 12 lead Abdomen: soft not tender  Extremities: warm strong pulses no edema  Neuro:  awake and oriented  GU: minimal UOP   Resolved Hospital Problem list     Assessment & Plan:  Symptomatic bradycardia (currently junctional bradycardia) w/ chest pain and SOB due to beta blocker w/ known h/o prior SVT and recent concern for cardiac conduction problem  1/2 life of lopressor is 4-7 hrs. It is  possible but seems unlikely that this is still on-going from her lopressor but possible her GFR is reduced.  Plan Admit to ICU  Will cont tele Stop BB Keep NPO except meds Cont MIVFs Hold her CCB (she reports she did NOT take this today) Will give an amp of calcium gluconate here.  At this point don't really think to add glucagon will add much  Start low dose dopamine titrate to HR 40-60 (want to avoid tachycardia Defer repeat echo to cards. Just had one in June  Will likely need left and right heart cath   Lactic acidosis due to reduced end-organ perfusion. (not due to sepsis)  Plan Cont MIVFs Rate control as above Recycle CEs  H/o asthma Plan PRN albuterol   RV hypertrophy Etiology not clear. 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 CT imaging neg for concern for recurrence   Hyperlipidemia Plan Cont zocor   H/o  rheumatoid arthritis Plan PRN analgesia    Best Practice (right click and "Reselect all SmartList Selections" daily)   Diet/type: NPO w/ oral meds DVT prophylaxis: prophylactic heparin  GI prophylaxis: PPI Lines: N/A Foley:  N/A Code Status:  full code Last date of multidisciplinary goals of care discussion [pending]  Labs   CBC: Recent Labs  Lab 05/06/23 1354  WBC 8.1  NEUTROABS 5.2  HGB 12.4  HCT 39.7  MCV 90.0  PLT 321    Basic Metabolic Panel: Recent Labs  Lab 04/30/23 1349 05/06/23 1354  NA 142 138  K 4.4 4.3  CL 103 97*  CO2 26 26  GLUCOSE 88 131*  BUN 14 18  CREATININE 0.88 1.04*  CALCIUM 9.4 9.3    GFR: Estimated Creatinine Clearance: 34.7 mL/min (A) (by C-G formula based on SCr of 1.04 mg/dL (H)). Recent Labs  Lab 05/06/23 1354 05/06/23 1528  WBC 8.1  --   LATICACIDVEN 2.0* 2.4*    Liver Function Tests: No results for input(s): "AST", "ALT", "ALKPHOS", "BILITOT", "PROT", "ALBUMIN" in the last 168 hours. No results for input(s): "LIPASE", "AMYLASE" in the last 168 hours. No results for input(s): "AMMONIA" in the last 168 hours.  ABG No results found for: "PHART", "PCO2ART", "PO2ART", "HCO3", "TCO2", "ACIDBASEDEF", "O2SAT"   Coagulation Profile: No results for input(s): "INR", "PROTIME" in the last 168 hours.  Cardiac Enzymes: No results for input(s): "CKTOTAL", "CKMB", "CKMBINDEX", "TROPONINI" in the last 168 hours.  HbA1C: No results found for: "HGBA1C"  CBG: No results for input(s): "GLUCAP" in the last 168 hours.  Review of Systems:   Review of Systems  Constitutional:  Positive for diaphoresis. Negative for chills and fever.  HENT: Negative.    Eyes: Negative.   Respiratory:  Positive  for shortness of breath.   Cardiovascular:  Positive for chest pain, leg swelling and PND.  Gastrointestinal:  Positive for nausea and vomiting.  Genitourinary: Negative.   Musculoskeletal: Negative.   Skin: Negative.   Neurological:  Positive for dizziness.  Endo/Heme/Allergies: Negative.   Psychiatric/Behavioral: Negative.       Past Medical History:  She,  has a past medical history of Accelerated junctional rhythm (11/17/2017), Acute cystitis without hematuria (09/04/2020), Asthma, Atypical chest pain (08/13/2016), 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) (10/11/2020), Cancer of upper lobe of left lung (HCC) (10/11/2020), COPD (chronic obstructive pulmonary disease) (HCC), Dyslipidemia (high LDL; low HDL) (08/13/2016), Dysuria (07/23/2021), Essential hypertension (07/05/2016), Falls (11/20/2016), H/O ultrasound guided needle biopsy of lung (08/24/2020), History of  radiation therapy (10/24/2020-11/06/2020), HTN (hypertension) (11/20/2016), Hyperlipidemia (04/29/2023), Hypokalemia (07/23/2021), Impaired mobility and ADLs (10/28/2017), 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   Surgical History:   Past Surgical History:  Procedure Laterality Date   ABDOMINAL HYSTERECTOMY     CARDIAC CATHETERIZATION     CARPAL TUNNEL RELEASE     CATARACT EXTRACTION     CHOLECYSTECTOMY     HEMORROIDECTOMY     JOINT REPLACEMENT     R knee   ORIF left elbow Left 12/2021   SVT ABLATION N/A 10/19/2018   Procedure: SVT ABLATION;  Surgeon: Marinus Maw, MD;  Location: [facility];  Service: Cardiovascular;  Laterality: N/A;   TOE SURGERY     VIDEO BRONCHOSCOPY WITH ENDOBRONCHIAL NAVIGATION  08/24/2020   VIDEO BRONCHOSCOPY WITH ENDOBRONCHIAL NAVIGATION N/A 08/24/2020   Procedure: VIDEO BRONCHOSCOPY WITH ENDOBRONCHIAL NAVIGATION;  Surgeon: Loreli Slot, MD;  Location: [facility] OR;  Service: Thoracic;  Laterality: N/A;     Social History:   reports that she has quit smoking. Her smoking use included cigarettes. She has a 7.5 pack-year smoking history. She quit smokeless tobacco use about 33 years ago. She reports that she does not drink alcohol and does not use drugs.   Family History:  Her family history includes Dementia in her father; Diabetes in her mother; Heart disease in her mother; Thyroid disease in her brother and sister.   Allergies Allergies  Allergen Reactions   Aspirin Other (See Comments)    Bleeding (intolerance)   Atorvastatin     Other Reaction(s): Unknown   Buspirone  Other (See Comments)    Patient does not recall taking this med   Celecoxib Other (See Comments)    Patient cannot remember reaction   Chlorpheniramine Other (See Comments)    Patient cannot remember reaction   Citalopram Other (See Comments)    Patient cannot remember reaction   Ezetimibe Other (See Comments)    Myalgias (intolerance)   Ezetimibe-Simvastatin     Other Reaction(s): Unknown   Hydrocodone-Acetaminophen Itching   Prochlorperazine Other (See Comments)    "Makes me feel like I'm having a face stroke"   Rosuvastatin     Other Reaction(s): Unknown   Statins Other (See Comments)    Myalgias (intolerance)   Venlafaxine Other (See Comments)    Patient cannot remember reaction     Home Medications  Prior to Admission medications   Medication Sig Start Date End Date Taking? Authorizing Provider  albuterol (PROVENTIL) (2.5 MG/3ML) 0.083% nebulizer solution Take 2.5 mg by nebulization  every 6 (six) hours as needed for wheezing or shortness of breath.   Yes [provider]  albuterol (VENTOLIN HFA) 108 (90 Base) MCG/ACT inhaler Inhale 2 puffs into the lungs every 6 (six) hours as needed for wheezing or shortness of breath.   Yes [provider]  celecoxib (CELEBREX) 200 MG capsule Take 200 mg by mouth 2 (two) times daily. 07/04/22  Yes [provider]  clonazePAM (KLONOPIN) 1 MG tablet Take 1 mg by mouth every 6 (six) hours as needed for anxiety.   Yes [provider]  diclofenac (VOLTAREN) 75 MG EC tablet Take 75 mg by mouth 2 (two) times daily. 05/03/22  Yes [provider]  diltiazem (CARDIZEM CD) 120 MG 24 hr capsule Take 120 mg by mouth daily. 04/14/23  Yes [provider]  FLUoxetine (PROZAC) 20 MG capsule Take 20 mg by mouth 2 (two) times daily.   Yes [provider]  gabapentin (NEURONTIN) 300 MG capsule Take 300 mg by mouth at bedtime.   Yes [provider]  metoprolol tartrate (LOPRESSOR) 50 MG tablet  Take 50 mg Lopressor 2 hours prior to your CT scan for a heart rate greater than 55. Patient taking differently: Take 50 mg by mouth See admin instructions. Take 50 mg by mouth 2 hours prior to your CT scan for a heart rate greater than 55. 04/30/23  Yes Revankar, Aundra Dubin, MD  Multiple Vitamin (MULTI-VITAMINS) TABS Take 1 tablet by mouth daily. Centrum silver   Yes [provider]  omeprazole (PRILOSEC) 40 MG capsule Take 40 mg by mouth daily. 08/13/20  Yes [provider]  simvastatin (ZOCOR) 40 MG tablet Take 40 mg by mouth at bedtime.   Yes [provider]     Critical care time: 34 minutes    Simonne Martinet ACNP-BC [facility] Pulmonary/Critical Care Pager # [phone] OR # [phone] if no answer

## 2023-05-06 NOTE — ED Notes (Signed)
Pt placed in trendelenburg

## 2023-05-06 NOTE — ED Triage Notes (Signed)
Pt came to hospital today for CT scan of heart to r/o any blockages. Pt states she has "been having trouble with her heart" but does not know any further details. Pt states she took metoprolol at home today (unsure of dose) prior to CT scan per cardiologist. When pt arrived to hospital for CT scan, pt was bradycardic and hypotensive. Pt unsure of how low her HR and BP were. Pt c/o CP, dizziness, and nausea.

## 2023-05-06 NOTE — Progress Notes (Addendum)
Notified by RN that patient's vitals had not improved s/p 1L Bolus. Remains bradycardic to the low 30s with MAPs in the low-mid 60s. [facility] Progressive has declined to take the patient with her current vital signs. Patient placed in Trendelenburg. Repeat EKG reviewed, now in junctional escape rhythm with retrograde P waves. Was Sinus H[place]on Foley on review of earlier EKG.  On assessment in the room she reports still feeling generally unwell and nauseated but without LOC.  - Given that progressive unit is unable to provide the monitoring she needs, I have discussed her care with Anders Simmonds from ICU. I do suspect she would benefit from ICU level monitoring +/- inotropic/chronotropic support, though need to take care not to exacerbate any potential underlying cardiac ischemia in this patient undergoing ischemic workup. Appreciate their assistance with this challenging case.   - Considering time since administration of BB, do not think glucagon administration would be helpful at this time, we are several half-lives out from Lopressor dose. Question whether this is all 2/2 metoprolol given that this does not seem to be improving with time.   Eliezer Mccoy, MD

## 2023-05-06 NOTE — ED Notes (Signed)
ED TO INPATIENT HANDOFF REPORT  ED Nurse Name and Phone #:  Joice Lofts, Paramedic [phone]  S Name/Age/Gender Angela Phillips 77 y.o. female Room/Bed: 026C/026C  Code Status   Code Status: Not on file  Home/SNF/Other Home Patient oriented to: self, place, time, and situation Is this baseline? Yes   Triage Complete: Triage complete  Chief Complaint Symptomatic bradycardia [R00.1]  Triage Note Pt came to hospital today for CT scan of heart to r/o any blockages. Pt states she has "been having trouble with her heart" but does not know any further details. Pt states she took metoprolol at home today (unsure of dose) prior to CT scan per cardiologist. When pt arrived to hospital for CT scan, pt was bradycardic and hypotensive. Pt unsure of how low her HR and BP were. Pt c/o CP, dizziness, and nausea.    Allergies Allergies  Allergen Reactions   Aspirin Other (See Comments)    Bleeding (intolerance)   Atorvastatin     Other Reaction(s): Unknown   Buspirone Other (See Comments)    Patient does not recall taking this med   Celecoxib Other (See Comments)    Patient cannot remember reaction   Chlorpheniramine Other (See Comments)    Patient cannot remember reaction   Citalopram Other (See Comments)    Patient cannot remember reaction   Ezetimibe Other (See Comments)    Myalgias (intolerance)   Ezetimibe-Simvastatin     Other Reaction(s): Unknown   Hydrocodone-Acetaminophen Itching   Prochlorperazine Other (See Comments)    "Makes me feel like I'm having a face stroke"   Rosuvastatin     Other Reaction(s): Unknown   Statins Other (See Comments)    Myalgias (intolerance)   Venlafaxine Other (See Comments)    Patient cannot remember reaction    Level of Care/Admitting Diagnosis ED Disposition     ED Disposition  Admit   Condition  --   Comment  Hospital Area: [facility] [[id number]]  Level of Care: Progressive [102]  Admit to Progressive based on  following criteria: CARDIOVASCULAR & THORACIC of moderate stability with acute coronary syndrome symptoms/low risk myocardial infarction/hypertensive urgency/arrhythmias/heart failure potentially compromising stability and stable post cardiovascular intervention patients.  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, treated with SBRT  Clinical stage I     COPD (chronic obstructive pulmonary disease) (HCC)    Dyslipidemia (high LDL; low HDL) 08/13/2016   Dysuria 40/34/7425   Essential hypertension 07/05/2016   Falls 11/20/2016   H/O ultrasound guided needle biopsy of lung 08/24/2020   History of radiation therapy 10/24/2020-11/06/2020   SBRT to left and right lungs   Dr Fayrene Fearing  [place]   HTN (hypertension) 11/20/2016   Hyperlipidemia 04/29/2023   Hypokalemia 07/23/2021   Impaired mobility and ADLs 10/28/2017   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   Osteoporosis 07/12/2022   Palpitations 07/05/2016   Paroxysmal SVT (supraventricular tachycardia) 07/05/2016   Personal history of malignant neoplasm of breast 04/29/2023    Pneumothorax on right 08/24/2020   Rheumatoid arthritis (HCC) 04/29/2023   Subdural hematoma (HCC) 11/20/2016   SVT (supraventricular tachycardia) 10/19/2018   Past Surgical History:  Procedure Laterality Date   ABDOMINAL HYSTERECTOMY     CARDIAC CATHETERIZATION     CARPAL TUNNEL RELEASE     CATARACT EXTRACTION     CHOLECYSTECTOMY     HEMORROIDECTOMY     JOINT REPLACEMENT     R knee   ORIF left elbow Left 12/2021   SVT ABLATION N/A 10/19/2018   Procedure: SVT ABLATION;  Surgeon: Marinus Maw, MD;  Location: [facility] INVASIVE CV LAB;  Service: Cardiovascular;  Laterality: N/A;   TOE SURGERY     VIDEO BRONCHOSCOPY WITH ENDOBRONCHIAL NAVIGATION  08/24/2020   VIDEO BRONCHOSCOPY WITH ENDOBRONCHIAL NAVIGATION N/A 08/24/2020   Procedure: VIDEO BRONCHOSCOPY WITH ENDOBRONCHIAL NAVIGATION;  Surgeon: Loreli Slot, MD;  Location: [facility] OR;  Service: Thoracic;  Laterality: N/A;     A IV Location/Drains/Wounds Patient Lines/Drains/Airways Status     Active Line/Drains/Airways     Name Placement date Placement time Site Days   Peripheral IV 05/06/23 20 G Anterior;Left Forearm 05/06/23  1413  Forearm  less than 1            Intake/Output Last 24 hours No intake or output data in the 24 hours ending 05/06/23 2030  Labs/Imaging Results for orders placed or performed during the hospital encounter of 05/06/23 (from the past 48 hour(s))  CBC with Differential     Status: Abnormal   Collection Time: 05/06/23  1:54 PM  Result Value Ref Range   WBC 8.1 4.0 - 10.5 K/uL   RBC 4.41 3.87 - 5.11 MIL/uL   Hemoglobin 12.4 12.0 - 15.0 g/dL   HCT 10.2 72.5 - 36.6 %   MCV 90.0 80.0 - 100.0 fL   MCH 28.1 26.0 - 34.0 pg   MCHC 31.2 30.0 - 36.0 g/dL   RDW 44.0 (H) 34.7 - 42.5 %   Platelets 321 150 - 400 K/uL   nRBC 0.0 0.0 - 0.2 %   Neutrophils Relative % 65 %   Neutro Abs 5.2 1.7 - 7.7 K/uL   Lymphocytes Relative 28 %   Lymphs Abs 2.3 0.7 - 4.0 K/uL   Monocytes Relative 7 %   Monocytes  Absolute 0.5 0.1 - 1.0 K/uL   Eosinophils Relative 0 %   Eosinophils Absolute 0.0 0.0 - 0.5 K/uL   Basophils Relative 0 %   Basophils Absolute 0.0 0.0 - 0.1 K/uL   Immature Granulocytes 0 %   Abs Immature Granulocytes 0.03 0.00 - 0.07 K/uL    Comment: Performed at [facility], [address] [place], [place] [address]  Basic metabolic panel     Status: Abnormal   Collection Time: 05/06/23  1:54 PM  Result Value Ref Range   Sodium 138 135 - 145 mmol/L   Potassium 4.3 3.5 - 5.1 mmol/L   Chloride 97 (L) 98 - 111 mmol/L   CO2 26 22 - 32 mmol/L   Glucose, Bld 131 (H) 70 - 99 mg/dL    Comment: Glucose reference range applies only  to samples taken after fasting for at least 8 hours.   BUN 18 8 - 23 mg/dL   Creatinine, Ser 6.96 (H) 0.44 - 1.00 mg/dL   Calcium 9.3 8.9 - 29.5 mg/dL   GFR, Estimated 56 (L) >60 mL/min    Comment: (NOTE) Calculated using the CKD-EPI Creatinine Equation (2021)    Anion gap 15 5 - 15    Comment: Performed at [facility], [address], [place], [place] [address]  Lactic acid, plasma     Status: Abnormal   Collection Time: 05/06/23  1:54 PM  Result Value Ref Range   Lactic Acid, Venous 2.0 (HH) 0.5 - 1.9 mmol/L    Comment: CRITICAL RESULT CALLED TO, READ BACK BY AND VERIFIED WITH [facility] RN @ [phone] 05/06/23 LEONARD,A Performed at [facility], [address] [place], [place] [address]   TSH     Status: Abnormal   Collection Time: 05/06/23  1:54 PM  Result Value Ref Range   TSH 8.729 (H) 0.350 - 4.500 uIU/mL    Comment: Performed by a 3rd Generation assay with a functional sensitivity of <=0.01 uIU/mL. Performed at [facility], [address], [place], [place] [address]   T4, free     Status: Abnormal   Collection Time: 05/06/23  1:54 PM  Result Value Ref Range   Free T4 1.17 (H) 0.61 - 1.12 ng/dL    Comment: (NOTE) Biotin ingestion may interfere with free T4 tests. If the results are inconsistent with the TSH level,  previous test results, or the clinical presentation, then consider biotin interference. If needed, order repeat testing after stopping biotin. Performed at [facility], [address] [place], [place] [address]   D-dimer, quantitative     Status: Abnormal   Collection Time: 05/06/23  1:54 PM  Result Value Ref Range   D-Dimer, Quant 0.99 (H) 0.00 - 0.50 ug/mL-FEU    Comment: (NOTE) At the manufacturer cut-off value of 0.5 g/mL FEU, this assay has a negative predictive value of 95-100%.This assay is intended for use in conjunction with a clinical pretest probability (PTP) assessment model to exclude pulmonary embolism (PE) and deep venous thrombosis (DVT) in outpatients suspected of PE or DVT. Results should be correlated with clinical presentation. Performed at [facility] Lab, [address] [place], [place] [address]   Brain natriuretic peptide     Status: Abnormal   Collection Time: 05/06/23  1:54 PM  Result Value Ref Range   B Natriuretic Peptide 403.7 (H) 0.0 - 100.0 pg/mL    Comment: Performed at [facility] Lab, [address] [place], [place] [address]  Troponin I (High Sensitivity)     Status: None   Collection Time: 05/06/23  1:54 PM  Result Value Ref Range   Troponin I (High Sensitivity) 10 <18 ng/L    Comment: (NOTE) Elevated high sensitivity troponin I (hsTnI) values and significant  changes across serial measurements may suggest ACS but many other  chronic and acute conditions are known to elevate hsTnI results.  Refer to the "Links" section for chest pain algorithms and additional  guidance. Performed at [facility] Lab, [address], [place], [place] [address]   Lactic acid, plasma     Status: Abnormal   Collection Time: 05/06/23  3:28 PM  Result Value Ref Range   Lactic Acid, Venous 2.4 (HH) 0.5 - 1.9 mmol/L    Comment: CRITICAL RESULT CALLED TO, READ BACK BY  AND VERIFIED WITH Helene Shoe RN @1624  ON 05/06/23 BY MAB MT Performed at  [facility] Lab, [address] [place], [place] [address]   Troponin I (High Sensitivity)     Status: None   Collection Time: 05/06/23  3:28 PM  Result Value Ref Range   Troponin I (High Sensitivity) 10 <18 ng/L    Comment: (NOTE) Elevated high sensitivity troponin I (hsTnI) values and significant  changes across serial measurements may suggest ACS but many other  chronic and acute conditions are known to elevate hsTnI results.  Refer to the "Links" section for chest pain algorithms and additional  guidance. Performed at [facility] Lab, [address], [place], [place] [address]    CT Angio Chest PE W and/or Wo Contrast  Result Date: 05/06/2023 CLINICAL DATA:  Positive D-dimer, bradycardia, hypotension, history of right lung cancer EXAM: CT ANGIOGRAPHY CHEST WITH CONTRAST TECHNIQUE: Multidetector CT imaging of the chest was performed using the standard protocol during bolus administration of intravenous contrast. Multiplanar CT image reconstructions and MIPs were obtained to evaluate the vascular anatomy. RADIATION DOSE REDUCTION: This exam was performed according to the departmental dose-optimization program which includes automated exposure control, adjustment of the mA and/or kV according to patient size and/or use of iterative reconstruction technique. CONTRAST:  75mL OMNIPAQUE IOHEXOL 350 MG/ML SOLN COMPARISON:  05/06/2023, 04/09/2023 FINDINGS: Cardiovascular: This is a technically adequate evaluation of the pulmonary vasculature. No filling defects or pulmonary emboli. The heart is unremarkable without pericardial effusion. Normal caliber of the thoracic aorta. Stable atherosclerosis of the aorta and coronary vasculature. Mediastinum/Nodes: No enlarged mediastinal, hilar, or axillary lymph nodes. Soft tissue density in the right paraesophageal region lower chest again noted, favoring underlying varices and unchanged. Thyroid gland, trachea, and esophagus demonstrate no significant  findings. Lungs/Pleura: Stable areas of linear consolidation within the left upper and right lower lobes likely reflect post therapeutic changes given history of prior lung cancer. No new areas of lung consolidation. Trace left pleural effusion has developed in the interim. No pneumothorax. The central airways are patent. Upper Abdomen: No acute abnormality. Musculoskeletal: Stable compression deformity and sclerosis of the T8 vertebral body, without significant progression since prior exam. No other acute bony abnormalities. Reconstructed images demonstrate no additional findings. Review of the MIP images confirms the above findings. IMPRESSION: 1. No evidence of pulmonary embolus. 2. Trace left pleural effusion, new since prior study. 3. Stable areas of linear consolidation and volume loss within the left upper and right lower lobes, consistent with post therapeutic changes in this patient with a history of lung cancer. 4. Stable T8 compression deformity.  No new fractures. 5. Aortic Atherosclerosis (ICD10-I70.0). Coronary artery atherosclerosis. Electronically Signed   By: Sharlet Salina M.D.   On: 05/06/2023 18:02   DG Chest Portable 1 View  Result Date: 05/06/2023 CLINICAL DATA:  Hypotension EXAM: PORTABLE CHEST 1 VIEW COMPARISON:  02/03/2023 FINDINGS: Stable abnormal density above and along the aortic arch attributable to the band of chronically atelectatic left upper lobe shown on multiple prior chest CT examinations, probably therapy related, correlate with patient history. Mild volume loss in the left hemithorax with mild elevation of left hemidiaphragm. Band of right infrahilar density compatible with known right lower lobe chronically atelectatic lung. No new opacities. Atherosclerotic calcification of the aortic arch. Mild lower thoracic spondylosis. IMPRESSION: 1. Chronically atelectatic portions of the left upper lobe and right lower lobe as on prior CT scans. No new opacities. 2. Mild lower  thoracic spondylosis.  3.  Aortic Atherosclerosis (ICD10-I70.0). Electronically Signed   By: Gaylyn Rong M.D.   On: 05/06/2023 15:57    Pending Labs Unresulted Labs (From admission, onward)    None       Vitals/Pain Today's Vitals   05/06/23 1915 05/06/23 1930 05/06/23 1945 05/06/23 2015  BP: (!) 103/52 (!) 90/56 100/61 (!) 84/55  Pulse: (!) 38 (!) 36 (!) 40 (!) 35  Resp: 19 20 16 14   Temp:      TempSrc:      SpO2: 100% 92% 96% 97%  PainSc:        Isolation Precautions No active isolations  Medications Medications  lactated ringers bolus 1,000 mL (0 mLs Intravenous Stopped 05/06/23 1530)  ondansetron (ZOFRAN) injection 4 mg (4 mg Intravenous Given 05/06/23 1638)  iohexol (OMNIPAQUE) 350 MG/ML injection 75 mL (75 mLs Intravenous Contrast Given 05/06/23 1714)  sodium chloride 0.9 % bolus 1,000 mL (1,000 mLs Intravenous New Bag/Given 05/06/23 1945)    Mobility walks     Focused Assessments Cardiac Assessment Handoff:  Cardiac Rhythm: Sinus bradycardia No results found for: "CKTOTAL", "CKMB", "CKMBINDEX", "TROPONINI" Lab Results  Component Value Date   DDIMER 0.99 (H) 05/06/2023   Does the Patient currently have chest pain? No    R Recommendations: See Admitting Provider Note  Report given to:   Additional Notes:

## 2023-05-07 ENCOUNTER — Inpatient Hospital Stay (HOSPITAL_COMMUNITY): Payer: Medicare HMO

## 2023-05-07 ENCOUNTER — Inpatient Hospital Stay: Payer: Medicare HMO | Admitting: Oncology

## 2023-05-07 DIAGNOSIS — R079 Chest pain, unspecified: Secondary | ICD-10-CM | POA: Diagnosis not present

## 2023-05-07 DIAGNOSIS — R001 Bradycardia, unspecified: Secondary | ICD-10-CM | POA: Diagnosis not present

## 2023-05-07 DIAGNOSIS — N179 Acute kidney failure, unspecified: Secondary | ICD-10-CM

## 2023-05-07 DIAGNOSIS — K7200 Acute and subacute hepatic failure without coma: Secondary | ICD-10-CM | POA: Diagnosis present

## 2023-05-07 HISTORY — DX: Acute and subacute hepatic failure without coma: K72.00

## 2023-05-07 HISTORY — DX: Acute kidney failure, unspecified: N17.9

## 2023-05-07 LAB — MRSA NEXT GEN BY PCR, NASAL: MRSA by PCR Next Gen: NOT DETECTED

## 2023-05-07 LAB — ECHOCARDIOGRAM LIMITED
Height: 61 in
S' Lateral: 1.4 cm
Weight: 1428.58 oz

## 2023-05-07 LAB — COMPREHENSIVE METABOLIC PANEL
ALT: 185 U/L — ABNORMAL HIGH (ref 0–44)
ALT: 346 U/L — ABNORMAL HIGH (ref 0–44)
AST: 221 U/L — ABNORMAL HIGH (ref 15–41)
AST: 378 U/L — ABNORMAL HIGH (ref 15–41)
Albumin: 3.1 g/dL — ABNORMAL LOW (ref 3.5–5.0)
Albumin: 3 g/dL — ABNORMAL LOW (ref 3.5–5.0)
Alkaline Phosphatase: 109 U/L (ref 38–126)
Alkaline Phosphatase: 94 U/L (ref 38–126)
Anion gap: 14 (ref 5–15)
Anion gap: 10 (ref 5–15)
BUN: 24 mg/dL — ABNORMAL HIGH (ref 8–23)
BUN: 25 mg/dL — ABNORMAL HIGH (ref 8–23)
CO2: 19 mmol/L — ABNORMAL LOW (ref 22–32)
CO2: 21 mmol/L — ABNORMAL LOW (ref 22–32)
Calcium: 10.2 mg/dL (ref 8.9–10.3)
Calcium: 9.4 mg/dL (ref 8.9–10.3)
Chloride: 105 mmol/L (ref 98–111)
Chloride: 104 mmol/L (ref 98–111)
Creatinine, Ser: 1.6 mg/dL — ABNORMAL HIGH (ref 0.44–1.00)
Creatinine, Ser: 1.31 mg/dL — ABNORMAL HIGH (ref 0.44–1.00)
GFR, Estimated: 33 mL/min — ABNORMAL LOW (ref >60)
GFR, Estimated: 42 mL/min — ABNORMAL LOW (ref >60)
Glucose, Bld: 140 mg/dL — ABNORMAL HIGH (ref 70–99)
Glucose, Bld: 145 mg/dL — ABNORMAL HIGH (ref 70–99)
Potassium: 5.4 mmol/L — ABNORMAL HIGH (ref 3.5–5.1)
Potassium: 4.7 mmol/L (ref 3.5–5.1)
Sodium: 138 mmol/L (ref 135–145)
Sodium: 135 mmol/L (ref 135–145)
Total Bilirubin: 0.9 mg/dL (ref 0.3–1.2)
Total Bilirubin: 0.8 mg/dL (ref 0.3–1.2)
Total Protein: 5.7 g/dL — ABNORMAL LOW (ref 6.5–8.1)
Total Protein: 5.7 g/dL — ABNORMAL LOW (ref 6.5–8.1)

## 2023-05-07 LAB — CBC
HCT: 42.7 % (ref 36.0–46.0)
Hemoglobin: 13 g/dL (ref 12.0–15.0)
MCH: 26.6 pg (ref 26.0–34.0)
MCHC: 30.4 g/dL (ref 30.0–36.0)
MCV: 87.5 fL (ref 80.0–100.0)
Platelets: 246 10*3/uL (ref 150–400)
RBC: 4.88 MIL/uL (ref 3.87–5.11)
RDW: 17 % — ABNORMAL HIGH (ref 11.5–15.5)
WBC: 11.3 10*3/uL — ABNORMAL HIGH (ref 4.0–10.5)
nRBC: 0 % (ref 0.0–0.2)

## 2023-05-07 LAB — LACTIC ACID, PLASMA: Lactic Acid, Venous: 3.5 mmol/L (ref 0.5–1.9)

## 2023-05-07 LAB — GLUCOSE, CAPILLARY: Glucose-Capillary: 135 mg/dL — ABNORMAL HIGH (ref 70–99)

## 2023-05-07 LAB — BRAIN NATRIURETIC PEPTIDE: B Natriuretic Peptide: 1586 pg/mL — ABNORMAL HIGH (ref 0.0–100.0)

## 2023-05-07 LAB — MAGNESIUM: Magnesium: 1.8 mg/dL (ref 1.7–2.4)

## 2023-05-07 MED ORDER — ONDANSETRON HCL 4 MG/2ML IJ SOLN
4.0000 mg | Freq: Four times a day (QID) | INTRAMUSCULAR | Status: DC | PRN
  Administered 2023-05-07 – 2023-05-10 (×2): 4 mg via INTRAVENOUS
  Filled 2023-05-07 (×2): qty 2

## 2023-05-07 MED ORDER — CLONAZEPAM 0.5 MG PO TABS
0.5000 mg | ORAL_TABLET | Freq: Two times a day (BID) | ORAL | Status: DC | PRN
  Administered 2023-05-07 – 2023-05-11 (×8): 0.5 mg via ORAL
  Filled 2023-05-07 (×8): qty 1

## 2023-05-07 MED ORDER — METOCLOPRAMIDE HCL 5 MG/ML IJ SOLN
5.0000 mg | Freq: Once | INTRAMUSCULAR | Status: AC
  Administered 2023-05-07: 5 mg via INTRAVENOUS
  Filled 2023-05-07: qty 2

## 2023-05-07 MED ORDER — SODIUM CHLORIDE 0.9% FLUSH: 3.0000 mL | INTRAVENOUS | Status: DC | PRN

## 2023-05-07 MED ORDER — METOCLOPRAMIDE HCL 5 MG/ML IJ SOLN: 10.0000 mg | Freq: Once | INTRAMUSCULAR | Status: DC

## 2023-05-07 MED ORDER — SODIUM CHLORIDE 0.9 % IV SOLN: INTRAVENOUS | Status: DC

## 2023-05-07 MED ORDER — ASPIRIN 81 MG PO CHEW
81.0000 mg | CHEWABLE_TABLET | ORAL | Status: AC
  Administered 2023-05-08: 81 mg via ORAL
  Filled 2023-05-07: qty 1

## 2023-05-07 MED ORDER — SODIUM CHLORIDE 0.9 % IV SOLN: 250.0000 mL | INTRAVENOUS | Status: DC | PRN

## 2023-05-07 MED ORDER — MAGNESIUM SULFATE 2 GM/50ML IV SOLN
2.0000 g | Freq: Once | INTRAVENOUS | Status: AC
  Administered 2023-05-07: 2 g via INTRAVENOUS
  Filled 2023-05-07: qty 50

## 2023-05-07 MED ORDER — SODIUM CHLORIDE 0.9% FLUSH
3.0000 mL | Freq: Two times a day (BID) | INTRAVENOUS | Status: DC
  Administered 2023-05-07 – 2023-05-08 (×2): 3 mL via INTRAVENOUS

## 2023-05-07 MED ORDER — CHLORHEXIDINE GLUCONATE CLOTH 2 % EX PADS
6.0000 | MEDICATED_PAD | Freq: Every day | CUTANEOUS | Status: DC
  Administered 2023-05-07 – 2023-05-14 (×8): 6 via TOPICAL

## 2023-05-07 NOTE — TOC Initial Note (Signed)
Transition of Care [facility]) - Initial/Assessment Note    Patient Details  Name: Angela Phillips MRN: [id number] Date of Birth: [birth year]  Transition of Care [facility]) CM/SW Contact:    Elliot Cousin, RN Phone Number: [phone] 05/07/2023, 6:22 PM  Clinical Narrative:  CM spoke to pt and lives at home with husband. Gave permission to speak to husband, Dimas Aguas. Pt reports she has RW and cane at home. Will need PT/OT evaluation and recommendations.                  Expected Discharge Plan: Home w Home Health Services Barriers to Discharge: Continued Medical Work up   Patient Goals and CMS Choice Patient states their goals for this hospitalization and ongoing recovery are:: wants to get better          Expected Discharge Plan and Services   Discharge Planning Services: CM Consult Post Acute Care Choice: Home Health Living arrangements for the past 2 months: Single Family Home                                      Prior Living Arrangements/Services Living arrangements for the past 2 months: Single Family Home Lives with:: Spouse Patient language and need for interpreter reviewed:: Yes Do you feel safe going back to the place where you live?: Yes      Need for Family Participation in Patient Care: No (Comment) Care giver support system in place?: Yes (comment) Current home services: DME (rolling walker, cane) Criminal Activity/Legal Involvement Pertinent to Current Situation/Hospitalization: No - Comment as needed  Activities of Daily Living Home Assistive Devices/Equipment: Walker (specify type), Grab bars in shower ADL Screening (condition at time of admission) Patient's cognitive ability adequate to safely complete daily activities?: Yes Is the patient deaf or have difficulty hearing?: Yes (hearing aid) Does the patient have difficulty seeing, even when wearing glasses/contacts?: No Does the patient have difficulty concentrating, remembering, or making  decisions?: No Patient able to express need for assistance with ADLs?: Yes Does the patient have difficulty dressing or bathing?: No Independently performs ADLs?: Yes (appropriate for developmental age) Does the patient have difficulty walking or climbing stairs?: Yes Weakness of Legs: Both Weakness of Arms/Hands: Left (previous fracture)  Permission Sought/Granted Permission sought to share information with : Case Manager, Family Supports, PCP Permission granted to share information with : Yes, Verbal Permission Granted  Share Information with NAME: Luana Herzberger     Permission granted to share info w Relationship: husband  Permission granted to share info w Contact Information: [phone]  Emotional Assessment Appearance:: Appears stated age Attitude/Demeanor/Rapport: Engaged Affect (typically observed): Accepting Orientation: : Oriented to Self, Oriented to Place, Oriented to  Time, Oriented to Situation   Psych Involvement: No (comment)  Admission diagnosis:  Anxiety [F41.9] Bradycardia [R00.1] Symptomatic bradycardia [R00.1] Near syncope [R55] Patient Active Problem List   Diagnosis Date Noted   Shock liver 05/07/2023   AKI (acute kidney injury) (HCC) 05/07/2023   Symptomatic bradycardia 05/06/2023   Hypotension due to drugs 05/06/2023   Dyspnea on exertion 04/30/2023   Angina pectoris (HCC) 04/30/2023   Hyperlipidemia 04/29/2023   Personal history of malignant neoplasm of breast 04/29/2023   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   Rheumatoid arthritis (HCC) 04/29/2023   Asthma    COPD (chronic obstructive pulmonary disease) (HCC)    History of radiation therapy    Osteoporosis 07/12/2022  Class: Chronic   Hypokalemia 07/23/2021   Dysuria 07/23/2021   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    Class: Chronic   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/2020    Class: Diagnosis of   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   Acute cystitis without  hematuria 09/04/2020   Pneumothorax on right 08/24/2020   H/O ultrasound guided needle biopsy of lung 08/24/2020   SVT (supraventricular tachycardia) 10/19/2018   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 [place] [address] Phone: [phone] Fax: [phone]     Social Determinants of Health (SDOH) Social History: SDOH Screenings   Food Insecurity: No Food Insecurity (05/07/2023)  Housing: Low Risk  (05/07/2023)  Transportation Needs: No Transportation Needs (05/07/2023)  Utilities: Not At Risk (05/07/2023)  Alcohol Screen: Low Risk  (08/20/2022)  Depression (PHQ2-9): Low Risk  (08/20/2022)  Financial Resource Strain: Low Risk  (08/20/2022)  Physical Activity: Inactive (08/20/2022)  Social Connections: Moderately Isolated (08/20/2022)  Stress: Stress Concern Present (08/20/2022)  Tobacco Use: Medium Risk (04/30/2023)   SDOH Interventions:     Readmission Risk Interventions     No data to display

## 2023-05-07 NOTE — Progress Notes (Signed)
 An USGPIV (ultrasound guided PIV) has been placed for short-term vasopressor infusion. A correctly placed ivWatch must be used when administering Vasopressors. Should this treatment be needed beyond 24 hours, central line access should be obtained.  It will be the responsibility of the bedside nurse to follow best practice to prevent extravasations.

## 2023-05-07 NOTE — Progress Notes (Addendum)
eLink Physician-Brief Progress Note Patient Name: Angela Phillips DOB: 11-15-1945 MRN: [id number]   Date of Service  05/07/2023  HPI/Events of Note  77 y.o. female presenting with bradycardia, nausea, weakness, and chest pain. Bradycardia persisted and she was transferred to the ICU for symptomatic bradycardia now on Dopamine infusion.   eICU Interventions  Heart rate improved 40s to 50s with dopamine infusion.  Continue intermittent calcium infusion  Persistently elevated lactate, trend with a.m. labs  PPI, home dose for GI prophylaxis DVT prophylaxis with heparin  No immediate intervention indicated   0419 -add Zofran for nausea and vomiting  Intervention Category Evaluation Type: New Patient Evaluation  Chigozie Basaldua 05/07/2023, 12:33 AM

## 2023-05-07 NOTE — Progress Notes (Signed)
NAME:  Angela Phillips, MRN:  [id number], DOB:  August 20, 1946, LOS: 1 ADMISSION DATE:  05/06/2023, CONSULTATION DATE:  05/06/23 REFERRING MD:  Angela Phillips, CHIEF COMPLAINT:  Symptomatic bradycardia   History of Present Illness:  Angela Phillips is a 77 year-old female with a history of atypical chest pain, asthma, lung adenocarcinoma s/p radiation therapy, COPD, hyperlipidemia, hypertension, and SVT presented to the ED with chest pain, dizziness, nausea. She was seen by Cardiology on 8/21 for frequent episodes (5-6x a day) of racing heart, felt radiating to her neck and left arm with associated with chest pressure. She had one episode of syncope with a fall on her sofa, noted worsening lower extremity edema. She uses supplemental oxygen at home at night, but was using it during the day during these episodes. On 8/27, she was on her way to CT coronary angiography as recommended by Cardiology, took metoprolol on the way, and became weak, nauseated, diaphoretic, prompting presentation to the ED.   In the ED, she was noted to have elevated Lactate, TSH, free T4. She was given IV fluids, tried Trendelenburg positioning with no improvement in hypotension or bradycardia. She was transferred to the ICU for vasoactive support.    Pertinent  Medical History  Junctional bradycardia  Cystitis Asthma Atypical chest pain  RLL adenocarcinoma treated with radiation therapy  COPD Hypertension, Hyperlipidemia  SVT  Breast cancer   Significant Hospital Events: Including procedures, antibiotic start and stop dates in addition to other pertinent events   8/27 admitted w/ cc: CP, dizziness and nausea. HR 30s. Had taken 50mg  metoprolol for first time at 1130 earlier that day. Remained bradycardic at 2230 after fluid challenge  8/28 HR improved to 45-50 on dopamine   Interim History / Subjective:  She continues to be nauseated with 4-5 episodes of non-bloody emesis since last night. She reports intermittent dizziness, chest  discomfort, and weakness right before she starts feeling nauseous. No current chest pain or shortness of breath.   Objective   Blood pressure 124/71, pulse (!) 51, temperature (!) 97.5 F (36.4 C), temperature source Oral, resp. rate (!) 26, height 5\' 1"  (1.549 m), weight 40.5 kg, SpO2 95%.        Intake/Output Summary (Last 24 hours) at 05/07/2023 1100 Last data filed at 05/07/2023 1000 Gross per 24 hour  Intake 334.22 ml  Output --  Net 334.22 ml   Filed Weights   05/07/23 0210  Weight: 40.5 kg   Examination: General: Elderly female, lying on the bed HEENT: [place]/AT, eyes anicteric.  moist mucus membranes Neuro: Alert, awake following commands Chest: Coarse breath sounds, no wheezes or rhonchi Heart: Bradycardic, regular rhythm, no murmurs or gallops Abdomen: Soft, nontender, nondistended, bowel sounds present Skin: No rash  Labs and images were reviewed  Resolved Hospital Problem list     Assessment & Plan:  Symptomatic junctional bradycardia w/ chest discomfort  Chronically dilated RV and body DDx for bradycardia is broad and includes Tachy Brady syndrome, secondary to beta blocker use (less likely given timing of symptoms), post radiation sinus node dysfunction.  Patient's heart rate remained in 50s Continue dopamine Continue telemetry monitoring Keep n.p.o. Cardiology is consulting Will repeat echocardiogram Possible RHC/LHC Avoid AV nodal blocking agents  Lactic Acidosis/metabolic acidosis Continue gentle fluid hydration Lactate is improving, down to 3.5  Acute kidney injury due to ischemic ATN  Shock liver Serum creatinine elevated to 1.6, baseline is less than 1 Monitor intake and output Monitor LFTs Avoid hepatotoxic and nephrotoxic agents  Asthma  Continue PRN albuterol   Hyperlipidemia  Continue statin    Best Practice (right click and "Reselect all SmartList Selections" daily)   Diet/type: NPO w/ oral meds DVT prophylaxis: prophylactic  heparin  GI prophylaxis: PPI Lines: N/A Foley:  N/A Code Status:  full code Last date of multidisciplinary goals of care discussion [8/28: Patient was updated at bedside, decision was to continue full scope of care]  Labs   CBC: Recent Labs  Lab 05/06/23 1354 05/07/23 0754  WBC 8.1 11.3*  NEUTROABS 5.2  --   HGB 12.4 13.0  HCT 39.7 42.7  MCV 90.0 87.5  PLT 321 246    Basic Metabolic Panel: Recent Labs  Lab 04/30/23 1349 05/06/23 1354 05/07/23 0754 05/07/23 0832  NA 142 138  --  138  K 4.4 4.3  --  5.4*  CL 103 97*  --  105  CO2 26 26  --  19*  GLUCOSE 88 131*  --  140*  BUN 14 18  --  24*  CREATININE 0.88 1.04*  --  1.60*  CALCIUM 9.4 9.3  --  10.2  MG  --   --  1.8  --    GFR: Estimated Creatinine Clearance: 19.1 mL/min (A) (by C-G formula based on SCr of 1.6 mg/dL (H)). Recent Labs  Lab 05/06/23 1354 05/06/23 1528 05/06/23 2213 05/07/23 0754  WBC 8.1  --   --  11.3*  LATICACIDVEN 2.0* 2.4* 4.9* 3.5*    Liver Function Tests: Recent Labs  Lab 05/07/23 0832  AST 221*  ALT 185*  ALKPHOS 109  BILITOT 0.9  PROT 5.7*  ALBUMIN 3.1*   No results for input(s): "LIPASE", "AMYLASE" in the last 168 hours. No results for input(s): "AMMONIA" in the last 168 hours.  ABG No results found for: "PHART", "PCO2ART", "PO2ART", "HCO3", "TCO2", "ACIDBASEDEF", "O2SAT"   Coagulation Profile: No results for input(s): "INR", "PROTIME" in the last 168 hours.  Cardiac Enzymes: No results for input(s): "CKTOTAL", "CKMB", "CKMBINDEX", "TROPONINI" in the last 168 hours.  HbA1C: No results found for: "HGBA1C"  CBG: Recent Labs  Lab 05/07/23 0030  GLUCAP 135*    The patient is critically ill due to severe symptomatic bradycardia/AKI/shock liver.  Critical care was necessary to treat or prevent imminent or life-threatening deterioration.  Critical care was time spent personally by me on the following activities: development of treatment plan with patient and/or  surrogate as well as nursing, discussions with consultants, evaluation of patient's response to treatment, examination of patient, obtaining history from patient or surrogate, ordering and performing treatments and interventions, ordering and review of laboratory studies, ordering and review of radiographic studies, pulse oximetry, re-evaluation of patient's condition and participation in multidisciplinary rounds.   During this encounter critical care time was devoted to patient care services described in this note for 39 minutes.     Cheri Fowler, MD [facility] See Amion for pager If no response to pager, please call [phone] until 7pm After 7pm, Please call E-link [phone]

## 2023-05-07 NOTE — Progress Notes (Signed)
Rounding Note    Patient Name: Angela Phillips Date of Encounter: 05/07/2023  [facility] Cardiologist: Josiah Lobo; Ladona Ridgel   Subjective   Patient very nauseated   No CP   Vomited earlier  Inpatient Medications    Scheduled Meds:  Chlorhexidine Gluconate Cloth  6 each Topical Daily   FLUoxetine  20 mg Oral BID   heparin  5,000 Units Subcutaneous Q8H   pantoprazole  40 mg Oral Daily   simvastatin  40 mg Oral QHS   Continuous Infusions:  dextrose 5% lactated ringers     DOPamine 5 mcg/kg/min (05/07/23 0400)   PRN Meds: albuterol, docusate sodium, ondansetron (ZOFRAN) IV, polyethylene glycol   Vital Signs    Vitals:   05/07/23 0500 05/07/23 0515 05/07/23 0530 05/07/23 0545  BP: 124/65 (!) 114/101 104/63 113/63  Pulse: (!) 44 (!) 47 (!) 45 (!) 46  Resp: 20 (!) 21 (!) 21 (!) 22  Temp:      TempSrc:      SpO2: 95% 96% 95% 95%  Weight:      Height:        Intake/Output Summary (Last 24 hours) at 05/07/2023 0649 Last data filed at 05/07/2023 0400 Gross per 24 hour  Intake 202.19 ml  Output --  Net 202.19 ml      05/07/2023    2:10 AM 04/30/2023    1:08 PM 04/15/2023    3:22 PM  Last 3 Weights  Weight (lbs) 89 lb 4.6 oz 118 lb 12.8 oz 118 lb  Weight (kg) 40.5 kg 53.887 kg 53.524 kg      Telemetry    Junctional rhythm   30s to 40s   - Personally Reviewed  ECG     - Personally Reviewed  Physical Exam   GEN  Thin 77 yo who is anxious  Neck: JVP is normal  Cardiac: RRR  No S3    Respiratory: Clear to auscultation bilaterally. GI:  Mild RUQ tendernes    MS: No edema; Neuro   CN II-XII intact   Pt sl confused  Labs    High Sensitivity Troponin:   Recent Labs  Lab 04/15/23 1627 05/06/23 1354 05/06/23 1528  TROPONINIHS 9 10 10      Chemistry Recent Labs  Lab 04/30/23 1349 05/06/23 1354  NA 142 138  K 4.4 4.3  CL 103 97*  CO2 26 26  GLUCOSE 88 131*  BUN 14 18  CREATININE 0.88 1.04*  CALCIUM 9.4 9.3  GFRNONAA  --  56*  ANIONGAP  --   15    Lipids No results for input(s): "CHOL", "TRIG", "HDL", "LABVLDL", "LDLCALC", "CHOLHDL" in the last 168 hours.  Hematology Recent Labs  Lab 05/06/23 1354  WBC 8.1  RBC 4.41  HGB 12.4  HCT 39.7  MCV 90.0  MCH 28.1  MCHC 31.2  RDW 16.7*  PLT 321   Thyroid  Recent Labs  Lab 05/06/23 1354  TSH 8.729*  FREET4 1.17*    BNP Recent Labs  Lab 05/06/23 1354  BNP 403.7*    DDimer  Recent Labs  Lab 05/06/23 1354  DDIMER 0.99*     Radiology    CT Angio Chest PE W and/or Wo Contrast  Result Date: 05/06/2023 CLINICAL DATA:  Positive D-dimer, bradycardia, hypotension, history of right lung cancer EXAM: CT ANGIOGRAPHY CHEST WITH CONTRAST TECHNIQUE: Multidetector CT imaging of the chest was performed using the standard protocol during bolus administration of intravenous contrast. Multiplanar CT image reconstructions and MIPs were  obtained to evaluate the vascular anatomy. RADIATION DOSE REDUCTION: This exam was performed according to the departmental dose-optimization program which includes automated exposure control, adjustment of the mA and/or kV according to patient size and/or use of iterative reconstruction technique. CONTRAST:  75mL OMNIPAQUE IOHEXOL 350 MG/ML SOLN COMPARISON:  05/06/2023, 04/09/2023 FINDINGS: Cardiovascular: This is a technically adequate evaluation of the pulmonary vasculature. No filling defects or pulmonary emboli. The heart is unremarkable without pericardial effusion. Normal caliber of the thoracic aorta. Stable atherosclerosis of the aorta and coronary vasculature. Mediastinum/Nodes: No enlarged mediastinal, hilar, or axillary lymph nodes. Soft tissue density in the right paraesophageal region lower chest again noted, favoring underlying varices and unchanged. Thyroid gland, trachea, and esophagus demonstrate no significant findings. Lungs/Pleura: Stable areas of linear consolidation within the left upper and right lower lobes likely reflect post therapeutic  changes given history of prior lung cancer. No new areas of lung consolidation. Trace left pleural effusion has developed in the interim. No pneumothorax. The central airways are patent. Upper Abdomen: No acute abnormality. Musculoskeletal: Stable compression deformity and sclerosis of the T8 vertebral body, without significant progression since prior exam. No other acute bony abnormalities. Reconstructed images demonstrate no additional findings. Review of the MIP images confirms the above findings. IMPRESSION: 1. No evidence of pulmonary embolus. 2. Trace left pleural effusion, new since prior study. 3. Stable areas of linear consolidation and volume loss within the left upper and right lower lobes, consistent with post therapeutic changes in this patient with a history of lung cancer. 4. Stable T8 compression deformity.  No new fractures. 5. Aortic Atherosclerosis (ICD10-I70.0). Coronary artery atherosclerosis. Electronically Signed   By: Sharlet Salina M.D.   On: 05/06/2023 18:02   DG Chest Portable 1 View  Result Date: 05/06/2023 CLINICAL DATA:  Hypotension EXAM: PORTABLE CHEST 1 VIEW COMPARISON:  02/03/2023 FINDINGS: Stable abnormal density above and along the aortic arch attributable to the band of chronically atelectatic left upper lobe shown on multiple prior chest CT examinations, probably therapy related, correlate with patient history. Mild volume loss in the left hemithorax with mild elevation of left hemidiaphragm. Band of right infrahilar density compatible with known right lower lobe chronically atelectatic lung. No new opacities. Atherosclerotic calcification of the aortic arch. Mild lower thoracic spondylosis. IMPRESSION: 1. Chronically atelectatic portions of the left upper lobe and right lower lobe as on prior CT scans. No new opacities. 2. Mild lower thoracic spondylosis. 3.  Aortic Atherosclerosis (ICD10-I70.0). Electronically Signed   By: Gaylyn Rong M.D.   On: 05/06/2023 15:57     Cardiac Studies   Echo   June 2024   1. Left ventricular ejection fraction, by estimation, is 60 to 65%. The  left ventricle has normal function. The left ventricle has no regional  wall motion abnormalities. Left ventricular diastolic parameters are  consistent with Grade I diastolic  dysfunction (impaired relaxation). GLS-19.4%   2. Right ventricular systolic function is normal. The right ventricular  size is severely enlarged. There is normal pulmonary artery systolic  pressure.   3. Right atrial size was severely dilated.   4. The mitral valve is normal in structure. No evidence of mitral valve  regurgitation. No evidence of mitral stenosis.   5. Tricuspid valve regurgitation is moderate.   6. The aortic valve is normal in structure. Aortic valve regurgitation is  mild. No aortic stenosis is present.   7. The inferior vena cava is normal in size with greater than 50%  respiratory variability, suggesting right  atrial pressure of 3 mmHg.      Monitor June 2024     The dominant rhythm is normal sinus rhythm with normal circadian variation.   There are rare premature atrial and premature ventricular beats.  There is a single 11-beat run of nonsustained supraventricular tachycardia, most likely ectopic atrial tachycardia.   Premature ventricular contractions there is no evidence of complex ventricular arrhythmia.   There is no evidence of significant bradycardia or pauses.   There are rare blocked premature atrial contractions, followed by junctional escape beats.   Several symptom-triggered recordings are submitted, but these show normal sinus rhythm.   Essentially normal arrhythmia monitor. There is no correlation between patient's symptoms and any rhythm abnormalities. \\ Myoview 2021   The left ventricular ejection fraction is hyperdynamic (>65%). This is a low risk study. The study is normal.    Patient Profile      Angela Phillips is a 77 y.o. female with a hx of  CAD (on CT scan)   who is being seen 05/06/2023 for the evaluation of bradycardia  at the request of Dr Suezanne Jacquet .   Assessment & Plan    1  Bradycardia  Pt presented yesterday for CCTA   Took metoprolol prior to coming   In waiting area became N/, profoundly bradycardic, hypotensive Sent to ER     SInce yesterday she has remained in junctional rhythm    Last night DA started at low dose   Now on 5 mcg/kg/min   BP 110s -120s/ She denies any mix up in meds  Was taking diltiazem 120 mg   Did not take yesterday   took 50 mg  metoprolol yesterday  Will review with EP   Concern with dyspnea/SOB and CP  about CAD Labs pending   Consider R/L heart cath prior EP evaluation.  2  Hx CP   Pt says spells of CP preceded by palpitations in neck    Here in hosp she said she had some palpitations in middle of night which nursing says were not associated with increase in HR    WOuld recomm L heart cath to define anatomy    Pt with CAD on CT scan AM labs pending   3  HL  Pt had been on simvistatin   Lipids pendings       For questions or updates, please contact [facility] Please consult [email or web address] for contact info under        Signed, Dietrich Pates, MD  05/07/2023, 6:49 AM

## 2023-05-07 NOTE — Progress Notes (Signed)
   Pt complains of nausea  No CP     HR 50s  (junctional), SBP 120s/  Labs:   BUN/CR 24/1.6  K 5.4  AST/ALT 221/185   Lactic acid 3.5   Bnp 1586  Reviewed with interventional service, EP  Recomm:  -Continue dopamine IV (currently at 5)  HR in 50s, not clear temp pacer wire would add but will follow     -IV fluids    -Limited echo  REevaluate LVEF -RUQ USN at bedside  Pt tender to palpation today -Repeat labs this PM    -Keep at llimited po (ice chips to wet mouth)  Dietrich Pates MD

## 2023-05-08 ENCOUNTER — Encounter: Payer: Self-pay | Admitting: Oncology

## 2023-05-08 ENCOUNTER — Encounter (HOSPITAL_COMMUNITY)
Admission: EM | Disposition: A | Discharge status: Home Health | Payer: Self-pay | Source: Home / Self Care | Attending: Internal Medicine

## 2023-05-08 ENCOUNTER — Other Ambulatory Visit: Payer: Self-pay

## 2023-05-08 ENCOUNTER — Inpatient Hospital Stay (HOSPITAL_COMMUNITY): Payer: Medicare HMO

## 2023-05-08 DIAGNOSIS — I2510 Atherosclerotic heart disease of native coronary artery without angina pectoris: Secondary | ICD-10-CM | POA: Diagnosis not present

## 2023-05-08 DIAGNOSIS — R570 Cardiogenic shock: Secondary | ICD-10-CM

## 2023-05-08 DIAGNOSIS — N179 Acute kidney failure, unspecified: Secondary | ICD-10-CM | POA: Diagnosis not present

## 2023-05-08 DIAGNOSIS — K7200 Acute and subacute hepatic failure without coma: Secondary | ICD-10-CM | POA: Diagnosis not present

## 2023-05-08 DIAGNOSIS — R001 Bradycardia, unspecified: Secondary | ICD-10-CM | POA: Diagnosis not present

## 2023-05-08 HISTORY — DX: Cardiogenic shock: R57.0

## 2023-05-08 HISTORY — PX: RIGHT/LEFT HEART CATH AND CORONARY ANGIOGRAPHY: CATH118266

## 2023-05-08 LAB — HEPATIC FUNCTION PANEL
ALT: 538 U/L — ABNORMAL HIGH (ref 0–44)
AST: 486 U/L — ABNORMAL HIGH (ref 15–41)
Albumin: 3 g/dL — ABNORMAL LOW (ref 3.5–5.0)
Alkaline Phosphatase: 90 U/L (ref 38–126)
Bilirubin, Direct: 0.4 mg/dL — ABNORMAL HIGH (ref 0.0–0.2)
Indirect Bilirubin: 0.8 mg/dL (ref 0.3–0.9)
Total Bilirubin: 1.2 mg/dL (ref 0.3–1.2)
Total Protein: 5.7 g/dL — ABNORMAL LOW (ref 6.5–8.1)

## 2023-05-08 LAB — POCT I-STAT EG7
Acid-base deficit: 2 mmol/L (ref 0.0–2.0)
Acid-base deficit: 2 mmol/L (ref 0.0–2.0)
Acid-base deficit: 2 mmol/L (ref 0.0–2.0)
Bicarbonate: 24.6 mmol/L (ref 20.0–28.0)
Bicarbonate: 24.9 mmol/L (ref 20.0–28.0)
Bicarbonate: 25 mmol/L (ref 20.0–28.0)
Calcium, Ion: 1.21 mmol/L (ref 1.15–1.40)
Calcium, Ion: 1.28 mmol/L (ref 1.15–1.40)
Calcium, Ion: 1.31 mmol/L (ref 1.15–1.40)
HCT: 36 % (ref 36.0–46.0)
HCT: 38 % (ref 36.0–46.0)
HCT: 38 % (ref 36.0–46.0)
Hemoglobin: 12.2 g/dL (ref 12.0–15.0)
Hemoglobin: 12.9 g/dL (ref 12.0–15.0)
Hemoglobin: 12.9 g/dL (ref 12.0–15.0)
O2 Saturation: 39 %
O2 Saturation: 48 %
O2 Saturation: 49 %
Potassium: 4.3 mmol/L (ref 3.5–5.1)
Potassium: 4.5 mmol/L (ref 3.5–5.1)
Potassium: 4.6 mmol/L (ref 3.5–5.1)
Sodium: 139 mmol/L (ref 135–145)
Sodium: 139 mmol/L (ref 135–145)
Sodium: 140 mmol/L (ref 135–145)
TCO2: 26 mmol/L (ref 22–32)
TCO2: 26 mmol/L (ref 22–32)
TCO2: 26 mmol/L (ref 22–32)
pCO2, Ven: 50.3 mmHg (ref 44–60)
pCO2, Ven: 50.6 mmHg (ref 44–60)
pCO2, Ven: 51.1 mmHg (ref 44–60)
pH, Ven: 7.295 (ref 7.25–7.43)
pH, Ven: 7.298 (ref 7.25–7.43)
pH, Ven: 7.301 (ref 7.25–7.43)
pO2, Ven: 25 mmHg — CL (ref 32–45)
pO2, Ven: 29 mmHg — CL (ref 32–45)
pO2, Ven: 30 mmHg — CL (ref 32–45)

## 2023-05-08 LAB — CBC
HCT: 39.3 % (ref 36.0–46.0)
Hemoglobin: 12.2 g/dL (ref 12.0–15.0)
MCH: 27.5 pg (ref 26.0–34.0)
MCHC: 31 g/dL (ref 30.0–36.0)
MCV: 88.7 fL (ref 80.0–100.0)
Platelets: 166 10*3/uL (ref 150–400)
RBC: 4.43 MIL/uL (ref 3.87–5.11)
RDW: 17.2 % — ABNORMAL HIGH (ref 11.5–15.5)
WBC: 18.6 10*3/uL — ABNORMAL HIGH (ref 4.0–10.5)
nRBC: 0.2 % (ref 0.0–0.2)

## 2023-05-08 LAB — BASIC METABOLIC PANEL
Anion gap: 10 (ref 5–15)
BUN: 28 mg/dL — ABNORMAL HIGH (ref 8–23)
CO2: 21 mmol/L — ABNORMAL LOW (ref 22–32)
Calcium: 9 mg/dL (ref 8.9–10.3)
Chloride: 105 mmol/L (ref 98–111)
Creatinine, Ser: 1.14 mg/dL — ABNORMAL HIGH (ref 0.44–1.00)
GFR, Estimated: 50 mL/min — ABNORMAL LOW (ref >60)
Glucose, Bld: 142 mg/dL — ABNORMAL HIGH (ref 70–99)
Potassium: 4.9 mmol/L (ref 3.5–5.1)
Sodium: 136 mmol/L (ref 135–145)

## 2023-05-08 LAB — LIPID PANEL
Cholesterol: 118 mg/dL (ref 0–200)
HDL: 37 mg/dL — ABNORMAL LOW (ref >40)
LDL Cholesterol: 68 mg/dL (ref 0–99)
Total CHOL/HDL Ratio: 3.2 ratio
Triglycerides: 67 mg/dL (ref <150)
VLDL: 13 mg/dL (ref 0–40)

## 2023-05-08 LAB — COOXEMETRY PANEL
Carboxyhemoglobin: 0.9 % (ref 0.5–1.5)
Carboxyhemoglobin: 0.3 % — ABNORMAL LOW (ref 0.5–1.5)
Methemoglobin: <0.7 % (ref 0.0–1.5)
Methemoglobin: 1.6 % — ABNORMAL HIGH (ref 0.0–1.5)
O2 Saturation: 42.3 %
O2 Saturation: 48.6 %
Total hemoglobin: 12.2 g/dL (ref 12.0–16.0)
Total hemoglobin: 11.7 g/dL — ABNORMAL LOW (ref 12.0–16.0)

## 2023-05-08 LAB — CREATININE, SERUM
Creatinine, Ser: 1.07 mg/dL — ABNORMAL HIGH (ref 0.44–1.00)
GFR, Estimated: 54 mL/min — ABNORMAL LOW (ref >60)

## 2023-05-08 LAB — POCT I-STAT 7, (LYTES, BLD GAS, ICA,H+H)
Acid-base deficit: 4 mmol/L — ABNORMAL HIGH (ref 0.0–2.0)
Bicarbonate: 21 mmol/L (ref 20.0–28.0)
Calcium, Ion: 1.23 mmol/L (ref 1.15–1.40)
HCT: 36 % (ref 36.0–46.0)
Hemoglobin: 12.2 g/dL (ref 12.0–15.0)
O2 Saturation: 93 %
Potassium: 4.5 mmol/L (ref 3.5–5.1)
Sodium: 139 mmol/L (ref 135–145)
TCO2: 22 mmol/L (ref 22–32)
pCO2 arterial: 39.2 mmHg (ref 32–48)
pH, Arterial: 7.337 — ABNORMAL LOW (ref 7.35–7.45)
pO2, Arterial: 71 mmHg — ABNORMAL LOW (ref 83–108)

## 2023-05-08 LAB — HEMOGLOBIN A1C
Hgb A1c MFr Bld: 6.6 % — ABNORMAL HIGH (ref 4.8–5.6)
Mean Plasma Glucose: 142.72 mg/dL

## 2023-05-08 LAB — MAGNESIUM: Magnesium: 2.2 mg/dL (ref 1.7–2.4)

## 2023-05-08 LAB — BRAIN NATRIURETIC PEPTIDE: B Natriuretic Peptide: 1309.3 pg/mL — ABNORMAL HIGH (ref 0.0–100.0)

## 2023-05-08 LAB — LACTIC ACID, PLASMA: Lactic Acid, Venous: 4 mmol/L (ref 0.5–1.9)

## 2023-05-08 SURGERY — RIGHT/LEFT HEART CATH AND CORONARY ANGIOGRAPHY
Anesthesia: LOCAL

## 2023-05-08 MED ORDER — SODIUM CHLORIDE 0.9 % IV SOLN: INTRAVENOUS | Status: DC

## 2023-05-08 MED ORDER — VERAPAMIL HCL 2.5 MG/ML IV SOLN
INTRAVENOUS | Status: DC | PRN
  Administered 2023-05-08: 10 mL via INTRA_ARTERIAL

## 2023-05-08 MED ORDER — IOHEXOL 350 MG/ML SOLN
INTRAVENOUS | Status: DC | PRN
  Administered 2023-05-08: 50 mL

## 2023-05-08 MED ORDER — LIDOCAINE HCL (PF) 1 % IJ SOLN
INTRAMUSCULAR | Status: AC
  Filled 2023-05-08: qty 30

## 2023-05-08 MED ORDER — VERAPAMIL HCL 2.5 MG/ML IV SOLN
INTRAVENOUS | Status: AC
  Filled 2023-05-08: qty 2

## 2023-05-08 MED ORDER — SODIUM CHLORIDE 0.9% FLUSH: 3.0000 mL | INTRAVENOUS | Status: DC | PRN

## 2023-05-08 MED ORDER — SODIUM CHLORIDE 0.9 % IV SOLN: 250.0000 mL | INTRAVENOUS | Status: DC | PRN

## 2023-05-08 MED ORDER — ONDANSETRON HCL 4 MG/2ML IJ SOLN
INTRAMUSCULAR | Status: DC | PRN
  Administered 2023-05-08: 4 mg via INTRAVENOUS

## 2023-05-08 MED ORDER — DOBUTAMINE-DEXTROSE 4-5 MG/ML-% IV SOLN
2.5000 ug/kg/min | INTRAVENOUS | Status: DC
  Administered 2023-05-08: 2.5 ug/kg/min via INTRAVENOUS
  Filled 2023-05-08: qty 250

## 2023-05-08 MED ORDER — DOBUTAMINE-DEXTROSE 4-5 MG/ML-% IV SOLN: 2.5000 ug/kg/min | INTRAVENOUS | Status: DC

## 2023-05-08 MED ORDER — FUROSEMIDE 10 MG/ML IJ SOLN
40.0000 mg | Freq: Once | INTRAMUSCULAR | Status: DC
  Filled 2023-05-08: qty 4

## 2023-05-08 MED ORDER — ONDANSETRON HCL 4 MG/2ML IJ SOLN
INTRAMUSCULAR | Status: AC
  Filled 2023-05-08: qty 2

## 2023-05-08 MED ORDER — HEPARIN SODIUM (PORCINE) 1000 UNIT/ML IJ SOLN
INTRAMUSCULAR | Status: AC
  Filled 2023-05-08: qty 10

## 2023-05-08 MED ORDER — HEPARIN SODIUM (PORCINE) 5000 UNIT/ML IJ SOLN
5000.0000 [IU] | Freq: Three times a day (TID) | INTRAMUSCULAR | Status: DC
  Administered 2023-05-08 – 2023-05-14 (×17): 5000 [IU] via SUBCUTANEOUS
  Filled 2023-05-08 (×16): qty 1

## 2023-05-08 MED ORDER — HEPARIN (PORCINE) IN NACL 1000-0.9 UT/500ML-% IV SOLN
INTRAVENOUS | Status: DC | PRN
  Administered 2023-05-08 (×2): 500 mL

## 2023-05-08 MED ORDER — LIDOCAINE HCL (PF) 1 % IJ SOLN
INTRAMUSCULAR | Status: DC | PRN
  Administered 2023-05-08: 5 mL
  Administered 2023-05-08: 2 mL

## 2023-05-08 MED ORDER — SODIUM CHLORIDE 0.9% FLUSH
3.0000 mL | Freq: Two times a day (BID) | INTRAVENOUS | Status: DC
  Administered 2023-05-08 – 2023-05-14 (×12): 3 mL via INTRAVENOUS

## 2023-05-08 MED ORDER — PRAVASTATIN SODIUM 40 MG PO TABS
80.0000 mg | ORAL_TABLET | Freq: Every day | ORAL | Status: DC
  Administered 2023-05-08: 80 mg via ORAL
  Filled 2023-05-08: qty 2

## 2023-05-08 MED ORDER — HEPARIN SODIUM (PORCINE) 1000 UNIT/ML IJ SOLN
INTRAMUSCULAR | Status: DC | PRN
  Administered 2023-05-08: 2500 [IU] via INTRAVENOUS

## 2023-05-08 SURGICAL SUPPLY — 12 items
CATH 5FR JL3.5 JR4 ANG PIG MP (CATHETERS) IMPLANT
CATH BALLN WEDGE 5F 110CM (CATHETERS) IMPLANT
CATH INFINITI 5 FR 3DRC (CATHETERS) IMPLANT
DEVICE RAD TR BAND REGULAR (VASCULAR PRODUCTS) IMPLANT
GLIDESHEATH SLEND SS 6F .021 (SHEATH) IMPLANT
GUIDEWIRE INQWIRE 1.5J.035X260 (WIRE) IMPLANT
INQWIRE 1.5J .035X260CM (WIRE) ×2
PACK CARDIAC CATHETERIZATION (CUSTOM PROCEDURE TRAY) ×2 IMPLANT
SET ATX-X65L (MISCELLANEOUS) IMPLANT
SHEATH GLIDE SLENDER 4/5FR (SHEATH) IMPLANT
SHEATH PROBE COVER 6X72 (BAG) IMPLANT
WIRE HI TORQ VERSACORE-J 145CM (WIRE) IMPLANT

## 2023-05-08 NOTE — Progress Notes (Signed)
Rounding Note    Patient Name: Angela Phillips Date of Encounter: 05/08/2023  [facility] Cardiologist: Josiah Lobo; Ladona Ridgel   Subjective   Patient denies CP   Breathing is fair     Inpatient Medications    Scheduled Meds:  aspirin  81 mg Oral Pre-Cath   Chlorhexidine Gluconate Cloth  6 each Topical Daily   FLUoxetine  20 mg Oral BID   heparin  5,000 Units Subcutaneous Q8H   pantoprazole  40 mg Oral Daily   simvastatin  40 mg Oral QHS   sodium chloride flush  3 mL Intravenous Q12H   Continuous Infusions:  sodium chloride     sodium chloride Stopped (05/07/23 1756)   dextrose 5% lactated ringers 75 mL/hr at 05/08/23 0700   DOPamine 5 mcg/kg/min (05/08/23 0700)   PRN Meds: sodium chloride, albuterol, clonazePAM, docusate sodium, ondansetron (ZOFRAN) IV, polyethylene glycol, sodium chloride flush   Vital Signs    Vitals:   05/08/23 0615 05/08/23 0630 05/08/23 0645 05/08/23 0700  BP: 134/71 139/87 (!) 143/67 126/74  Pulse: (!) 52 (!) 52 (!) 55 (!) 52  Resp: (!) 27 (!) 26 (!) 24 (!) 27  Temp:      TempSrc:      SpO2: 95% 96% 94% 94%  Weight:      Height:        Intake/Output Summary (Last 24 hours) at 05/08/2023 0809 Last data filed at 05/08/2023 0700 Gross per 24 hour  Intake 1803.2 ml  Output 200 ml  Net 1603.2 ml      05/08/2023    5:00 AM 05/07/2023    2:10 AM 04/30/2023    1:08 PM  Last 3 Weights  Weight (lbs) 101 lb 13.6 oz 89 lb 4.6 oz 118 lb 12.8 oz  Weight (kg) 46.2 kg 40.5 kg 53.887 kg      Telemetry    Junctional 50s   - Personally Reviewed  ECG     EKG yesterday  Junctional RHythm  52 bpm  RBBB- Personally Reviewed  Physical Exam   GEN  Thin 77 yo who is anxious  Neck: JVP is normal  Cardiac: RRR  No S3    Respiratory: Clear to auscultation  Ext  No LE  edema; Neuro   CN II-XII intact   Pt alert Labs    High Sensitivity Troponin:   Recent Labs  Lab 04/15/23 1627 05/06/23 1354 05/06/23 1528  TROPONINIHS 9 10 10       Chemistry Recent Labs  Lab 05/06/23 1354 05/07/23 0754 05/07/23 0832 05/07/23 1550  NA 138  --  138 135  K 4.3  --  5.4* 4.7  CL 97*  --  105 104  CO2 26  --  19* 21*  GLUCOSE 131*  --  140* 145*  BUN 18  --  24* 25*  CREATININE 1.04*  --  1.60* 1.31*  CALCIUM 9.3  --  10.2 9.4  MG  --  1.8  --   --   PROT  --   --  5.7* 5.7*  ALBUMIN  --   --  3.1* 3.0*  AST  --   --  221* 378*  ALT  --   --  185* 346*  ALKPHOS  --   --  109 94  BILITOT  --   --  0.9 0.8  GFRNONAA 56*  --  33* 42*  ANIONGAP 15  --  14 10    Lipids  Recent Labs  Lab 05/08/23 0218  CHOL 118  TRIG 67  HDL 37*  LDLCALC 68  CHOLHDL 3.2    Hematology Recent Labs  Lab 05/06/23 1354 05/07/23 0754  WBC 8.1 11.3*  RBC 4.41 4.88  HGB 12.4 13.0  HCT 39.7 42.7  MCV 90.0 87.5  MCH 28.1 26.6  MCHC 31.2 30.4  RDW 16.7* 17.0*  PLT 321 246   Thyroid  Recent Labs  Lab 05/06/23 1354  TSH 8.729*  FREET4 1.17*    BNP Recent Labs  Lab 05/06/23 1354 05/07/23 0754 05/08/23 0218  BNP 403.7* 1,586.0* 1,309.3*    DDimer  Recent Labs  Lab 05/06/23 1354  DDIMER 0.99*     Radiology    US Abdomen Limited RUQ (LIVER/GB)  Result Date: 05/07/2023 CLINICAL DATA:  Right upper quadrant pain EXAM: ULTRASOUND ABDOMEN LIMITED RIGHT UPPER QUADRANT COMPARISON:  None Available. FINDINGS: Gallbladder: Surgically removed Common bile duct: Diameter: 3.4 mm. Liver: Mild increase in echogenicity likely related to fatty infiltration. No focal mass is seen. Portal vein is patent on color Doppler imaging with normal direction of blood flow towards the liver. Other: Note is made of a small right-sided pleural effusion. IMPRESSION: Fatty liver. Right effusion. Electronically Signed   By: Alcide Clever M.D.   On: 05/07/2023 21:19   ECHOCARDIOGRAM LIMITED  Result Date: 05/07/2023    ECHOCARDIOGRAM LIMITED REPORT   Patient Name:   Angela Phillips Date of Exam: 05/07/2023 Medical Rec #:  [id number]        Height:       61.0 in  Accession #:    [id number]       Weight:       89.3 lb Date of Birth:  [birth year]         BSA:          1.341 m Patient Age:    77 years         BP:           133/72 mmHg Patient Gender: F                HR:           55 bpm. Exam Location:  Inpatient Procedure: Cardiac Doppler, Limited Echo and Limited Color Doppler Indications:    Chest Pain R07.9  History:        Patient has prior history of Echocardiogram examinations, most                 recent 02/19/2023. Cancer and COPD, Arrythmias:Bradycardia,                 Signs/Symptoms:Hypotension and Chest Pain; Risk                 Factors:Hypertension, Former Smoker and Dyslipidemia.  Sonographer:    Aron Baba Referring Phys: [id number] Safiya Girdler V Vanity Larsson  Sonographer Comments: Technically difficult study due to poor echo windows. IMPRESSIONS  1. Techincally diffculty study with limited views. Left ventricular ejection fraction, by estimation, is 55 to 60%. The left ventricle has normal function. Left ventricular endocardial border not optimally defined to evaluate regional wall motion.  2. Right ventricular systolic function is normal. The right ventricular size is moderately enlarged.  3. Tricuspid valve regurgitation is moderate. FINDINGS  Left Ventricle: Left ventricular ejection fraction, by estimation, is 55 to 60%. The left ventricle has normal function. Left ventricular endocardial border not optimally defined to evaluate regional wall motion. The left ventricular internal cavity size was small. There  is no left ventricular hypertrophy. Right Ventricle: The right ventricular size is moderately enlarged. Right ventricular systolic function is normal. Pericardium: There is no evidence of pericardial effusion. Mitral Valve: The mitral valve is normal in structure. Tricuspid Valve: The tricuspid valve is normal in structure. Tricuspid valve regurgitation is moderate. Additional Comments: Spectral Doppler performed. Color Doppler performed.  LEFT VENTRICLE PLAX 2D LVIDd:          2.80 cm LVIDs:         1.40 cm LV PW:         0.70 cm LV IVS:        0.40 cm  LEFT ATRIUM         Index LA diam:    2.70 cm 2.01 cm/m Epifanio Lesches MD Electronically signed by Epifanio Lesches MD Signature Date/Time: 05/07/2023/4:29:22 PM    Final    CT Angio Chest PE W and/or Wo Contrast  Result Date: 05/06/2023 CLINICAL DATA:  Positive D-dimer, bradycardia, hypotension, history of right lung cancer EXAM: CT ANGIOGRAPHY CHEST WITH CONTRAST TECHNIQUE: Multidetector CT imaging of the chest was performed using the standard protocol during bolus administration of intravenous contrast. Multiplanar CT image reconstructions and MIPs were obtained to evaluate the vascular anatomy. RADIATION DOSE REDUCTION: This exam was performed according to the departmental dose-optimization program which includes automated exposure control, adjustment of the mA and/or kV according to patient size and/or use of iterative reconstruction technique. CONTRAST:  75mL OMNIPAQUE IOHEXOL 350 MG/ML SOLN COMPARISON:  05/06/2023, 04/09/2023 FINDINGS: Cardiovascular: This is a technically adequate evaluation of the pulmonary vasculature. No filling defects or pulmonary emboli. The heart is unremarkable without pericardial effusion. Normal caliber of the thoracic aorta. Stable atherosclerosis of the aorta and coronary vasculature. Mediastinum/Nodes: No enlarged mediastinal, hilar, or axillary lymph nodes. Soft tissue density in the right paraesophageal region lower chest again noted, favoring underlying varices and unchanged. Thyroid gland, trachea, and esophagus demonstrate no significant findings. Lungs/Pleura: Stable areas of linear consolidation within the left upper and right lower lobes likely reflect post therapeutic changes given history of prior lung cancer. No new areas of lung consolidation. Trace left pleural effusion has developed in the interim. No pneumothorax. The central airways are patent. Upper Abdomen: No acute  abnormality. Musculoskeletal: Stable compression deformity and sclerosis of the T8 vertebral body, without significant progression since prior exam. No other acute bony abnormalities. Reconstructed images demonstrate no additional findings. Review of the MIP images confirms the above findings. IMPRESSION: 1. No evidence of pulmonary embolus. 2. Trace left pleural effusion, new since prior study. 3. Stable areas of linear consolidation and volume loss within the left upper and right lower lobes, consistent with post therapeutic changes in this patient with a history of lung cancer. 4. Stable T8 compression deformity.  No new fractures. 5. Aortic Atherosclerosis (ICD10-I70.0). Coronary artery atherosclerosis. Electronically Signed   By: Sharlet Salina M.D.   On: 05/06/2023 18:02   DG Chest Portable 1 View  Result Date: 05/06/2023 CLINICAL DATA:  Hypotension EXAM: PORTABLE CHEST 1 VIEW COMPARISON:  02/03/2023 FINDINGS: Stable abnormal density above and along the aortic arch attributable to the band of chronically atelectatic left upper lobe shown on multiple prior chest CT examinations, probably therapy related, correlate with patient history. Mild volume loss in the left hemithorax with mild elevation of left hemidiaphragm. Band of right infrahilar density compatible with known right lower lobe chronically atelectatic lung. No new opacities. Atherosclerotic calcification of the aortic arch. Mild lower thoracic spondylosis. IMPRESSION: 1. Chronically  atelectatic portions of the left upper lobe and right lower lobe as on prior CT scans. No new opacities. 2. Mild lower thoracic spondylosis. 3.  Aortic Atherosclerosis (ICD10-I70.0). Electronically Signed   By: Gaylyn Rong M.D.   On: 05/06/2023 15:57    Cardiac Studies   Limited echo 04/17/23   1. Techincally diffculty study with limited views. Left ventricular  ejection fraction, by estimation, is 55 to 60%. The left ventricle has  normal function. Left  ventricular endocardial border not optimally defined  to evaluate regional wall motion.   2. Right ventricular systolic function is normal. The right ventricular  size is moderately enlarged.   3. Tricuspid valve regurgitation is moderate.   Echo   June 2024   1. Left ventricular ejection fraction, by estimation, is 60 to 65%. The  left ventricle has normal function. The left ventricle has no regional  wall motion abnormalities. Left ventricular diastolic parameters are  consistent with Grade I diastolic  dysfunction (impaired relaxation). GLS-19.4%   2. Right ventricular systolic function is normal. The right ventricular  size is severely enlarged. There is normal pulmonary artery systolic  pressure.   3. Right atrial size was severely dilated.   4. The mitral valve is normal in structure. No evidence of mitral valve  regurgitation. No evidence of mitral stenosis.   5. Tricuspid valve regurgitation is moderate.   6. The aortic valve is normal in structure. Aortic valve regurgitation is  mild. No aortic stenosis is present.   7. The inferior vena cava is normal in size with greater than 50%  respiratory variability, suggesting right atrial pressure of 3 mmHg.      Monitor June 2024     The dominant rhythm is normal sinus rhythm with normal circadian variation.   There are rare premature atrial and premature ventricular beats.  There is a single 11-beat run of nonsustained supraventricular tachycardia, most likely ectopic atrial tachycardia.   Premature ventricular contractions there is no evidence of complex ventricular arrhythmia.   There is no evidence of significant bradycardia or pauses.   There are rare blocked premature atrial contractions, followed by junctional escape beats.   Several symptom-triggered recordings are submitted, but these show normal sinus rhythm.   Essentially normal arrhythmia monitor. There is no correlation between patient's symptoms and any rhythm  abnormalities. \\ Myoview 2021   The left ventricular ejection fraction is hyperdynamic (>65%). This is a low risk study. The study is normal.    Patient Profile      LINLEE DORTON is a 77 y.o. female with a hx of CAD (on CT scan)   who is being seen 05/06/2023 for the evaluation of bradycardia  at the request of Dr Suezanne Jacquet .   Assessment & Plan    1  Bradycardia  Pt presented for [facility] 2 days ago  Took metoprolol prior to coming   In waiting area became N/, profoundly bradycardic, hypotensive Sent to ER    Pt denies mix up in meds  Did not take dilt on am of test   Since admit she has remained in junctional rhythm   Rates now in 50s (on 5 mcg/kg/min)   BP is improved     I have alerted EP   Would proceed with R/L heart cath to eval for ischemia First   2  Hx CP   Pt says spells of CP preceded by palpitations in neck    Here in hosp she said she had some  palpitations in middle of night which nursing says were not associated with increase in HR    WOuld recomm R/L heart cath to define anatomy  and pressures   Pt with CAD on CT scan Cr improved, BP improved     Plan for today    3  HL  Pt had been on simvistatin   LDL 68  HDL 37 Trig 67   Chol 118      4  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

## 2023-05-08 NOTE — H&P (View-Only) (Signed)
Rounding Note    Patient Name: Angela Phillips Date of Encounter: 05/08/2023  [facility] Cardiologist: Josiah Lobo; Ladona Ridgel   Subjective   Patient denies CP   Breathing is fair     Inpatient Medications    Scheduled Meds:  aspirin  81 mg Oral Pre-Cath   Chlorhexidine Gluconate Cloth  6 each Topical Daily   FLUoxetine  20 mg Oral BID   heparin  5,000 Units Subcutaneous Q8H   pantoprazole  40 mg Oral Daily   simvastatin  40 mg Oral QHS   sodium chloride flush  3 mL Intravenous Q12H   Continuous Infusions:  sodium chloride     sodium chloride Stopped (05/07/23 1756)   dextrose 5% lactated ringers 75 mL/hr at 05/08/23 0700   DOPamine 5 mcg/kg/min (05/08/23 0700)   PRN Meds: sodium chloride, albuterol, clonazePAM, docusate sodium, ondansetron (ZOFRAN) IV, polyethylene glycol, sodium chloride flush   Vital Signs    Vitals:   05/08/23 0615 05/08/23 0630 05/08/23 0645 05/08/23 0700  BP: 134/71 139/87 (!) 143/67 126/74  Pulse: (!) 52 (!) 52 (!) 55 (!) 52  Resp: (!) 27 (!) 26 (!) 24 (!) 27  Temp:      TempSrc:      SpO2: 95% 96% 94% 94%  Weight:      Height:        Intake/Output Summary (Last 24 hours) at 05/08/2023 0809 Last data filed at 05/08/2023 0700 Gross per 24 hour  Intake 1803.2 ml  Output 200 ml  Net 1603.2 ml      05/08/2023    5:00 AM 05/07/2023    2:10 AM 04/30/2023    1:08 PM  Last 3 Weights  Weight (lbs) 101 lb 13.6 oz 89 lb 4.6 oz 118 lb 12.8 oz  Weight (kg) 46.2 kg 40.5 kg 53.887 kg      Telemetry    Junctional 50s   - Personally Reviewed  ECG     EKG yesterday  Junctional RHythm  52 bpm  RBBB- Personally Reviewed  Physical Exam   GEN  Thin 77 yo who is anxious  Neck: JVP is normal  Cardiac: RRR  No S3    Respiratory: Clear to auscultation  Ext  No LE  edema; Neuro   CN II-XII intact   Pt alert Labs    High Sensitivity Troponin:   Recent Labs  Lab 04/15/23 1627 05/06/23 1354 05/06/23 1528  TROPONINIHS 9 10 10       Chemistry Recent Labs  Lab 05/06/23 1354 05/07/23 0754 05/07/23 0832 05/07/23 1550  NA 138  --  138 135  K 4.3  --  5.4* 4.7  CL 97*  --  105 104  CO2 26  --  19* 21*  GLUCOSE 131*  --  140* 145*  BUN 18  --  24* 25*  CREATININE 1.04*  --  1.60* 1.31*  CALCIUM 9.3  --  10.2 9.4  MG  --  1.8  --   --   PROT  --   --  5.7* 5.7*  ALBUMIN  --   --  3.1* 3.0*  AST  --   --  221* 378*  ALT  --   --  185* 346*  ALKPHOS  --   --  109 94  BILITOT  --   --  0.9 0.8  GFRNONAA 56*  --  33* 42*  ANIONGAP 15  --  14 10    Lipids  Recent Labs  Lab 05/08/23 0218  CHOL 118  TRIG 67  HDL 37*  LDLCALC 68  CHOLHDL 3.2    Hematology Recent Labs  Lab 05/06/23 1354 05/07/23 0754  WBC 8.1 11.3*  RBC 4.41 4.88  HGB 12.4 13.0  HCT 39.7 42.7  MCV 90.0 87.5  MCH 28.1 26.6  MCHC 31.2 30.4  RDW 16.7* 17.0*  PLT 321 246   Thyroid  Recent Labs  Lab 05/06/23 1354  TSH 8.729*  FREET4 1.17*    BNP Recent Labs  Lab 05/06/23 1354 05/07/23 0754 05/08/23 0218  BNP 403.7* 1,586.0* 1,309.3*    DDimer  Recent Labs  Lab 05/06/23 1354  DDIMER 0.99*     Radiology    US Abdomen Limited RUQ (LIVER/GB)  Result Date: 05/07/2023 CLINICAL DATA:  Right upper quadrant pain EXAM: ULTRASOUND ABDOMEN LIMITED RIGHT UPPER QUADRANT COMPARISON:  None Available. FINDINGS: Gallbladder: Surgically removed Common bile duct: Diameter: 3.4 mm. Liver: Mild increase in echogenicity likely related to fatty infiltration. No focal mass is seen. Portal vein is patent on color Doppler imaging with normal direction of blood flow towards the liver. Other: Note is made of a small right-sided pleural effusion. IMPRESSION: Fatty liver. Right effusion. Electronically Signed   By: Alcide Clever M.D.   On: 05/07/2023 21:19   ECHOCARDIOGRAM LIMITED  Result Date: 05/07/2023    ECHOCARDIOGRAM LIMITED REPORT   Patient Name:   Angela Phillips Date of Exam: 05/07/2023 Medical Rec #:  [id number]        Height:       61.0 in  Accession #:    [id number]       Weight:       89.3 lb Date of Birth:  [birth year]         BSA:          1.341 m Patient Age:    77 years         BP:           133/72 mmHg Patient Gender: F                HR:           55 bpm. Exam Location:  Inpatient Procedure: Cardiac Doppler, Limited Echo and Limited Color Doppler Indications:    Chest Pain R07.9  History:        Patient has prior history of Echocardiogram examinations, most                 recent 02/19/2023. Cancer and COPD, Arrythmias:Bradycardia,                 Signs/Symptoms:Hypotension and Chest Pain; Risk                 Factors:Hypertension, Former Smoker and Dyslipidemia.  Sonographer:    Aron Baba Referring Phys: [id number] Safiya Girdler V Vanity Larsson  Sonographer Comments: Technically difficult study due to poor echo windows. IMPRESSIONS  1. Techincally diffculty study with limited views. Left ventricular ejection fraction, by estimation, is 55 to 60%. The left ventricle has normal function. Left ventricular endocardial border not optimally defined to evaluate regional wall motion.  2. Right ventricular systolic function is normal. The right ventricular size is moderately enlarged.  3. Tricuspid valve regurgitation is moderate. FINDINGS  Left Ventricle: Left ventricular ejection fraction, by estimation, is 55 to 60%. The left ventricle has normal function. Left ventricular endocardial border not optimally defined to evaluate regional wall motion. The left ventricular internal cavity size was small. There  is no left ventricular hypertrophy. Right Ventricle: The right ventricular size is moderately enlarged. Right ventricular systolic function is normal. Pericardium: There is no evidence of pericardial effusion. Mitral Valve: The mitral valve is normal in structure. Tricuspid Valve: The tricuspid valve is normal in structure. Tricuspid valve regurgitation is moderate. Additional Comments: Spectral Doppler performed. Color Doppler performed.  LEFT VENTRICLE PLAX 2D LVIDd:          2.80 cm LVIDs:         1.40 cm LV PW:         0.70 cm LV IVS:        0.40 cm  LEFT ATRIUM         Index LA diam:    2.70 cm 2.01 cm/m Epifanio Lesches MD Electronically signed by Epifanio Lesches MD Signature Date/Time: 05/07/2023/4:29:22 PM    Final    CT Angio Chest PE W and/or Wo Contrast  Result Date: 05/06/2023 CLINICAL DATA:  Positive D-dimer, bradycardia, hypotension, history of right lung cancer EXAM: CT ANGIOGRAPHY CHEST WITH CONTRAST TECHNIQUE: Multidetector CT imaging of the chest was performed using the standard protocol during bolus administration of intravenous contrast. Multiplanar CT image reconstructions and MIPs were obtained to evaluate the vascular anatomy. RADIATION DOSE REDUCTION: This exam was performed according to the departmental dose-optimization program which includes automated exposure control, adjustment of the mA and/or kV according to patient size and/or use of iterative reconstruction technique. CONTRAST:  75mL OMNIPAQUE IOHEXOL 350 MG/ML SOLN COMPARISON:  05/06/2023, 04/09/2023 FINDINGS: Cardiovascular: This is a technically adequate evaluation of the pulmonary vasculature. No filling defects or pulmonary emboli. The heart is unremarkable without pericardial effusion. Normal caliber of the thoracic aorta. Stable atherosclerosis of the aorta and coronary vasculature. Mediastinum/Nodes: No enlarged mediastinal, hilar, or axillary lymph nodes. Soft tissue density in the right paraesophageal region lower chest again noted, favoring underlying varices and unchanged. Thyroid gland, trachea, and esophagus demonstrate no significant findings. Lungs/Pleura: Stable areas of linear consolidation within the left upper and right lower lobes likely reflect post therapeutic changes given history of prior lung cancer. No new areas of lung consolidation. Trace left pleural effusion has developed in the interim. No pneumothorax. The central airways are patent. Upper Abdomen: No acute  abnormality. Musculoskeletal: Stable compression deformity and sclerosis of the T8 vertebral body, without significant progression since prior exam. No other acute bony abnormalities. Reconstructed images demonstrate no additional findings. Review of the MIP images confirms the above findings. IMPRESSION: 1. No evidence of pulmonary embolus. 2. Trace left pleural effusion, new since prior study. 3. Stable areas of linear consolidation and volume loss within the left upper and right lower lobes, consistent with post therapeutic changes in this patient with a history of lung cancer. 4. Stable T8 compression deformity.  No new fractures. 5. Aortic Atherosclerosis (ICD10-I70.0). Coronary artery atherosclerosis. Electronically Signed   By: Sharlet Salina M.D.   On: 05/06/2023 18:02   DG Chest Portable 1 View  Result Date: 05/06/2023 CLINICAL DATA:  Hypotension EXAM: PORTABLE CHEST 1 VIEW COMPARISON:  02/03/2023 FINDINGS: Stable abnormal density above and along the aortic arch attributable to the band of chronically atelectatic left upper lobe shown on multiple prior chest CT examinations, probably therapy related, correlate with patient history. Mild volume loss in the left hemithorax with mild elevation of left hemidiaphragm. Band of right infrahilar density compatible with known right lower lobe chronically atelectatic lung. No new opacities. Atherosclerotic calcification of the aortic arch. Mild lower thoracic spondylosis. IMPRESSION: 1. Chronically  atelectatic portions of the left upper lobe and right lower lobe as on prior CT scans. No new opacities. 2. Mild lower thoracic spondylosis. 3.  Aortic Atherosclerosis (ICD10-I70.0). Electronically Signed   By: Gaylyn Rong M.D.   On: 05/06/2023 15:57    Cardiac Studies   Limited echo 04/17/23   1. Techincally diffculty study with limited views. Left ventricular  ejection fraction, by estimation, is 55 to 60%. The left ventricle has  normal function. Left  ventricular endocardial border not optimally defined  to evaluate regional wall motion.   2. Right ventricular systolic function is normal. The right ventricular  size is moderately enlarged.   3. Tricuspid valve regurgitation is moderate.   Echo   June 2024   1. Left ventricular ejection fraction, by estimation, is 60 to 65%. The  left ventricle has normal function. The left ventricle has no regional  wall motion abnormalities. Left ventricular diastolic parameters are  consistent with Grade I diastolic  dysfunction (impaired relaxation). GLS-19.4%   2. Right ventricular systolic function is normal. The right ventricular  size is severely enlarged. There is normal pulmonary artery systolic  pressure.   3. Right atrial size was severely dilated.   4. The mitral valve is normal in structure. No evidence of mitral valve  regurgitation. No evidence of mitral stenosis.   5. Tricuspid valve regurgitation is moderate.   6. The aortic valve is normal in structure. Aortic valve regurgitation is  mild. No aortic stenosis is present.   7. The inferior vena cava is normal in size with greater than 50%  respiratory variability, suggesting right atrial pressure of 3 mmHg.      Monitor June 2024     The dominant rhythm is normal sinus rhythm with normal circadian variation.   There are rare premature atrial and premature ventricular beats.  There is a single 11-beat run of nonsustained supraventricular tachycardia, most likely ectopic atrial tachycardia.   Premature ventricular contractions there is no evidence of complex ventricular arrhythmia.   There is no evidence of significant bradycardia or pauses.   There are rare blocked premature atrial contractions, followed by junctional escape beats.   Several symptom-triggered recordings are submitted, but these show normal sinus rhythm.   Essentially normal arrhythmia monitor. There is no correlation between patient's symptoms and any rhythm  abnormalities. \\ Myoview 2021   The left ventricular ejection fraction is hyperdynamic (>65%). This is a low risk study. The study is normal.    Patient Profile      Angela Phillips is a 77 y.o. female with a hx of CAD (on CT scan)   who is being seen 05/06/2023 for the evaluation of bradycardia  at the request of Dr Suezanne Jacquet .   Assessment & Plan    1  Bradycardia  Pt presented for CCTA 2 days ago  Took metoprolol prior to coming   In waiting area became N/, profoundly bradycardic, hypotensive Sent to ER    Pt denies mix up in meds  Did not take dilt on am of test   Since admit she has remained in junctional rhythm   Rates now in 50s (on 5 mcg/kg/min)   BP is improved     I have alerted EP   Would proceed with R/L heart cath to eval for ischemia First   2  Hx CP   Pt says spells of CP preceded by palpitations in neck    Here in hosp she said she had some  palpitations in middle of night which nursing says were not associated with increase in HR    WOuld recomm R/L heart cath to define anatomy  and pressures   Pt with CAD on CT scan Cr improved, BP improved     Plan for today    3  HL  Pt had been on simvistatin   LDL 68  HDL 37 Trig 67   Chol 118      4  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

## 2023-05-08 NOTE — Procedures (Signed)
Central Venous Catheter Insertion Procedure Note  Angela Phillips  [id number]  1945-12-15  Date:05/08/23  Time:5:27 PM   Provider Performing:Briarrose Shor   Procedure: Insertion of Non-tunneled Central Venous [phone]) with US guidance ([id number])   Indication(s) Medication administration  Consent Risks of the procedure as well as the alternatives and risks of each were explained to the patient and/or caregiver.  Consent for the procedure was obtained and is signed in the bedside chart  Anesthesia Topical only with 1% lidocaine   Timeout Verified patient identification, verified procedure, site/side was marked, verified correct patient position, special equipment/implants available, medications/allergies/relevant history reviewed, required imaging and test results available.  Sterile Technique Maximal sterile technique including full sterile barrier drape, hand hygiene, sterile gown, sterile gloves, mask, hair covering, sterile ultrasound probe cover (if used).  Procedure Description Area of catheter insertion was cleaned with chlorhexidine and draped in sterile fashion.  With real-time ultrasound guidance a central venous catheter was placed into the left subclavian vein. Nonpulsatile blood flow and easy flushing noted in all ports.  The catheter was sutured in place and sterile dressing applied.  Complications/Tolerance None; patient tolerated the procedure well. Chest X-ray is ordered to verify placement for internal jugular or subclavian cannulation.   Chest x-ray is not ordered for femoral cannulation.  EBL Minimal  Specimen(s) None

## 2023-05-08 NOTE — Progress Notes (Signed)
NAME:  Angela Phillips, MRN:  [id number], DOB:  Jul 31, 1946, LOS: 2 ADMISSION DATE:  05/06/2023, CONSULTATION DATE:  05/06/23 REFERRING MD:  Marisue Humble, CHIEF COMPLAINT:  Symptomatic bradycardia   History of Present Illness:  Angela Phillips is a 77 year-old female with a history of atypical chest pain, asthma, lung adenocarcinoma s/p radiation therapy, COPD, hyperlipidemia, hypertension, and SVT presented to the ED with chest pain, dizziness, nausea. She was seen by Cardiology on 8/21 for frequent episodes (5-6x a day) of racing heart, felt radiating to her neck and left arm with associated with chest pressure. She had one episode of syncope with a fall on her sofa, noted worsening lower extremity edema. She uses supplemental oxygen at home at night, but was using it during the day during these episodes. On 8/27, she was on her way to CT coronary angiography as recommended by Cardiology, took metoprolol on the way, and became weak, nauseated, diaphoretic, prompting presentation to the ED.   In the ED, she was noted to have elevated Lactate, TSH, free T4. She was given IV fluids, tried Trendelenburg positioning with no improvement in hypotension or bradycardia. She was transferred to the ICU for vasoactive support.    Pertinent  Medical History  Junctional bradycardia  Cystitis Asthma Atypical chest pain  RLL adenocarcinoma treated with radiation therapy  COPD Hypertension, Hyperlipidemia  SVT  Breast cancer   Significant Hospital Events: Including procedures, antibiotic start and stop dates in addition to other pertinent events   8/27 admitted w/ cc: CP, dizziness and nausea. HR 30s. Had taken 50mg  metoprolol for first time at 1130 earlier that day. Remained bradycardic at 2230 after fluid challenge  8/28 HR improved to 45-50 on dopamine   Interim History / Subjective:  Her nausea has somewhat improved, she had two episodes of non-bloody emesis last night. She currently feels short of breath. She  has not used albuterol today. She reports some dizziness and chest discomfort only when she coughs.   Objective   Blood pressure 126/74, pulse (!) 52, temperature 97.8 F (36.6 C), resp. rate (!) 27, height 5\' 1"  (1.549 m), weight 46.2 kg, SpO2 94%.        Intake/Output Summary (Last 24 hours) at 05/08/2023 0857 Last data filed at 05/08/2023 0700 Gross per 24 hour  Intake 1803.2 ml  Output 200 ml  Net 1603.2 ml   Filed Weights   05/07/23 0210 05/08/23 0500  Weight: 40.5 kg 46.2 kg   Examination: General: Elderly female, lying on the bed. No acute distress  HEENT: Lindy/AT, eyes anicteric.  moist mucus membranes Neuro: Alert, awake following commands Chest: Coarse breath sounds. Diffused wheezing. No crackles.  Heart: Bradycardic, regular rhythm, no murmurs or gallops Abdomen: Soft, nontender, nondistended, bowel sounds present Skin: No rash  Labs and images were reviewed  Resolved Hospital Problem list     Assessment & Plan:  Symptomatic junctional bradycardia w/ chest discomfort  Chronically dilated RV and body DDx for bradycardia is broad and includes Tachy Brady syndrome, secondary to beta blocker use (less likely given timing of symptoms), post radiation sinus node dysfunction. Limited Echo 8/28: LVEF 55-60%, LV endocardial border not optimally defined to evaluate regional wall motion, RV systolic function normal, RV moderately enlarged, moderate TR.  Patient's heart rate remains in 50s Continue dopamine, currently at 5 mics Continue telemetry monitoring Keep n.p.o. Cardiology is consulting, plan for RHC/LHC today Avoid AV nodal blocking agents  Lactic Acidosis/metabolic acidosis Continue gentle fluid hydration Lactate improved to 3.5  Repeat lactate  Acute kidney injury due to ischemic ATN Shock liver Serum creatinine now 1.14, close to baseline of 1. Supports pre-renal etiology of AKI  Monitor intake and output Monitor LFTs Avoid hepatotoxic and nephrotoxic  agents  RUQ tenderness   Noted on exam.  US unremarkable for acute pathologies, shows fatty liver and a small right-sided pleural effusion  Continue to monitor   Asthma  Continue PRN albuterol   Hyperlipidemia  Switch simvastatin to pravastatin  Best Practice (right click and "Reselect all SmartList Selections" daily)   Diet/type: NPO w/ oral meds DVT prophylaxis: prophylactic heparin  GI prophylaxis: PPI Lines: N/A Foley:  N/A Code Status:  full code Last date of multidisciplinary goals of care discussion [[name]]  Labs   CBC: Recent Labs  Lab 05/06/23 1354 05/07/23 0754  WBC 8.1 11.3*  NEUTROABS 5.2  --   HGB 12.4 13.0  HCT 39.7 42.7  MCV 90.0 87.5  PLT 321 246    Basic Metabolic Panel: Recent Labs  Lab 05/06/23 1354 05/07/23 0754 05/07/23 0832 05/07/23 1550 05/08/23 0739  NA 138  --  138 135 136  K 4.3  --  5.4* 4.7 4.9  CL 97*  --  105 104 105  CO2 26  --  19* 21* 21*  GLUCOSE 131*  --  140* 145* 142*  BUN 18  --  24* 25* 28*  CREATININE 1.04*  --  1.60* 1.31* 1.14*  CALCIUM 9.3  --  10.2 9.4 9.0  MG  --  1.8  --   --  2.2   GFR: Estimated Creatinine Clearance: 30.6 mL/min (A) (by C-G formula based on SCr of 1.14 mg/dL (H)). Recent Labs  Lab 05/06/23 1354 05/06/23 1528 05/06/23 2213 05/07/23 0754  WBC 8.1  --   --  11.3*  LATICACIDVEN 2.0* 2.4* 4.9* 3.5*    Liver Function Tests: Recent Labs  Lab 05/07/23 0832 05/07/23 1550  AST 221* 378*  ALT 185* 346*  ALKPHOS 109 94  BILITOT 0.9 0.8  PROT 5.7* 5.7*  ALBUMIN 3.1* 3.0*   No results for input(s): "LIPASE", "AMYLASE" in the last 168 hours. No results for input(s): "AMMONIA" in the last 168 hours.  ABG No results found for: "PHART", "PCO2ART", "PO2ART", "HCO3", "TCO2", "ACIDBASEDEF", "O2SAT"   Coagulation Profile: No results for input(s): "INR", "PROTIME" in the last 168 hours.  Cardiac Enzymes: No results for input(s): "CKTOTAL", "CKMB", "CKMBINDEX",  "TROPONINI" in the last 168 hours.  HbA1C: Hgb A1c MFr Bld  Date/Time Value Ref Range Status  05/08/2023 02:18 AM 6.6 (H) 4.8 - 5.6 % Final    Comment:    (NOTE) Pre diabetes:          5.7%-6.4%  Diabetes:              >6.4%  Glycemic control for   <7.0% adults with diabetes     CBG: Recent Labs  Lab 05/07/23 0030  GLUCAP 135*    The patient is critically ill due to symptomatic bradycardia/AKI.  Critical care was necessary to treat or prevent imminent or life-threatening deterioration.  Critical care was time spent personally by me on the following activities: development of treatment plan with patient and/or surrogate as well as nursing, discussions with consultants, evaluation of patient's response to treatment, examination of patient, obtaining history from patient or surrogate, ordering and performing treatments and interventions, ordering and review of laboratory studies, ordering and review of radiographic studies, pulse oximetry, re-evaluation of patient's  condition and participation in multidisciplinary rounds.   During this encounter critical care time was devoted to patient care services described in this note for 33 minutes.     Cheri Fowler, MD [facility] See Amion for pager If no response to pager, please call [phone] until 7pm After 7pm, Please call E-link [phone]

## 2023-05-08 NOTE — Progress Notes (Signed)
Spoke with primary RN regarding PICC placement. Per RN, the patient is currently having a CL placed, and PICC order to be cancelled.

## 2023-05-08 NOTE — Progress Notes (Signed)
Patient underwent left and right heart cardiac cath which showed   3 vessel CAD. Coronary vessels are very small in caliber. 75% diffuse first diagonal. 50% mid LAD, 80% OM1, 90% mid RCA Moderately elevated LV filling pressures. PCWP 33/30 with mean 27 mm Hg. LVEDP 27 mm Hg Moderate pulmonary HTN. PAP 54/24 mean 37 mm Hg High RA pressures with large V wave c/w severe TR. RA pressure 42/32 mean 25 mm Hg Low cardiac output. PA sat 48%, cardiac output 2.37 L/min with index 1.67.     Will place central line to get Coox  Will switch dopamine to dobutamine  Holding Lasix as patient is preload dependent.  Cardiology is following     Cheri Fowler, MD [facility] See Amion for pager If no response to pager, please call [phone] until 7pm After 7pm, Please call E-link [phone]

## 2023-05-08 NOTE — Progress Notes (Signed)
An USGPIV (ultrasound guided PIV) 22 g x 1.75" on LFA has been placed for short-term vasopressor infusion. A correctly placed ivWatch must be used when administering Vasopressors. Should this treatment be needed beyond 24 hours, central line access should be obtained.  It will be the responsibility of the bedside nurse to follow best practice to prevent extravasations.

## 2023-05-08 NOTE — Interval H&P Note (Signed)
History and Physical Interval Note:  05/08/2023 12:31 PM  Angela Phillips  has presented today for surgery, with the diagnosis of chest pain - unstable angina.  The various methods of treatment have been discussed with the patient and family. After consideration of risks, benefits and other options for treatment, the patient has consented to  Procedure(s): RIGHT/LEFT HEART CATH AND CORONARY ANGIOGRAPHY (N/A) as a surgical intervention.  The patient's history has been reviewed, patient examined, no change in status, stable for surgery.  I have reviewed the patient's chart and labs.  Questions were answered to the patient's satisfaction.    Cath Lab Visit (complete for each Cath Lab visit)  Clinical Evaluation Leading to the Procedure:   ACS: Yes.    Non-ACS:    Anginal Classification: CCS III  Anti-ischemic medical therapy: Maximal Therapy (2 or more classes of medications)  Non-Invasive Test Results: No non-invasive testing performed  Prior CABG: No previous CABG       Angela Phillips [facility] 05/08/2023 12:31 PM

## 2023-05-09 ENCOUNTER — Encounter (HOSPITAL_COMMUNITY): Payer: Self-pay | Admitting: Cardiology

## 2023-05-09 DIAGNOSIS — R001 Bradycardia, unspecified: Secondary | ICD-10-CM | POA: Diagnosis not present

## 2023-05-09 DIAGNOSIS — R570 Cardiogenic shock: Secondary | ICD-10-CM | POA: Diagnosis not present

## 2023-05-09 LAB — BASIC METABOLIC PANEL
Anion gap: 10 (ref 5–15)
BUN: 30 mg/dL — ABNORMAL HIGH (ref 8–23)
CO2: 24 mmol/L (ref 22–32)
Calcium: 9.1 mg/dL (ref 8.9–10.3)
Chloride: 101 mmol/L (ref 98–111)
Creatinine, Ser: 1 mg/dL (ref 0.44–1.00)
GFR, Estimated: 58 mL/min — ABNORMAL LOW (ref >60)
Glucose, Bld: 120 mg/dL — ABNORMAL HIGH (ref 70–99)
Potassium: 4.1 mmol/L (ref 3.5–5.1)
Sodium: 135 mmol/L (ref 135–145)

## 2023-05-09 LAB — COOXEMETRY PANEL
Carboxyhemoglobin: 1.3 % (ref 0.5–1.5)
Carboxyhemoglobin: 1.2 % (ref 0.5–1.5)
Carboxyhemoglobin: 1.2 % (ref 0.5–1.5)
Methemoglobin: <0.7 % (ref 0.0–1.5)
Methemoglobin: <0.7 % (ref 0.0–1.5)
Methemoglobin: <0.7 % (ref 0.0–1.5)
O2 Saturation: 49.9 %
O2 Saturation: 58.1 %
O2 Saturation: 75.1 %
Total hemoglobin: 11.1 g/dL — ABNORMAL LOW (ref 12.0–16.0)
Total hemoglobin: 11 g/dL — ABNORMAL LOW (ref 12.0–16.0)
Total hemoglobin: 11.3 g/dL — ABNORMAL LOW (ref 12.0–16.0)

## 2023-05-09 LAB — CBC
HCT: 33.4 % — ABNORMAL LOW (ref 36.0–46.0)
Hemoglobin: 10.6 g/dL — ABNORMAL LOW (ref 12.0–15.0)
MCH: 27.7 pg (ref 26.0–34.0)
MCHC: 31.7 g/dL (ref 30.0–36.0)
MCV: 87.2 fL (ref 80.0–100.0)
Platelets: 119 10*3/uL — ABNORMAL LOW (ref 150–400)
RBC: 3.83 MIL/uL — ABNORMAL LOW (ref 3.87–5.11)
RDW: 17.1 % — ABNORMAL HIGH (ref 11.5–15.5)
WBC: 13 10*3/uL — ABNORMAL HIGH (ref 4.0–10.5)
nRBC: 1.2 % — ABNORMAL HIGH (ref 0.0–0.2)

## 2023-05-09 LAB — HEPATIC FUNCTION PANEL
ALT: 566 U/L — ABNORMAL HIGH (ref 0–44)
AST: 397 U/L — ABNORMAL HIGH (ref 15–41)
Albumin: 2.9 g/dL — ABNORMAL LOW (ref 3.5–5.0)
Alkaline Phosphatase: 81 U/L (ref 38–126)
Bilirubin, Direct: 0.2 mg/dL (ref 0.0–0.2)
Indirect Bilirubin: 0.4 mg/dL (ref 0.3–0.9)
Total Bilirubin: 0.6 mg/dL (ref 0.3–1.2)
Total Protein: 5.1 g/dL — ABNORMAL LOW (ref 6.5–8.1)

## 2023-05-09 LAB — BRAIN NATRIURETIC PEPTIDE: B Natriuretic Peptide: 1226 pg/mL — ABNORMAL HIGH (ref 0.0–100.0)

## 2023-05-09 LAB — CG4 I-STAT (LACTIC ACID): Lactic Acid, Venous: 2.1 mmol/L (ref 0.5–1.9)

## 2023-05-09 LAB — MAGNESIUM: Magnesium: 2.1 mg/dL (ref 1.7–2.4)

## 2023-05-09 MED ORDER — ASPIRIN 81 MG PO CHEW
81.0000 mg | CHEWABLE_TABLET | Freq: Every day | ORAL | Status: DC
  Administered 2023-05-09 – 2023-05-14 (×6): 81 mg via ORAL
  Filled 2023-05-09 (×6): qty 1

## 2023-05-09 MED ORDER — FUROSEMIDE 10 MG/ML IJ SOLN: 40.0000 mg | Freq: Two times a day (BID) | INTRAMUSCULAR | Status: DC

## 2023-05-09 MED ORDER — BUDESONIDE 0.25 MG/2ML IN SUSP
0.2500 mg | Freq: Two times a day (BID) | RESPIRATORY_TRACT | Status: DC
  Administered 2023-05-09 – 2023-05-14 (×10): 0.25 mg via RESPIRATORY_TRACT
  Filled 2023-05-09 (×12): qty 2

## 2023-05-09 MED ORDER — TRAMADOL HCL 50 MG PO TABS
50.0000 mg | ORAL_TABLET | Freq: Once | ORAL | Status: AC
  Administered 2023-05-09: 50 mg via ORAL
  Filled 2023-05-09: qty 1

## 2023-05-09 MED ORDER — POTASSIUM CHLORIDE CRYS ER 20 MEQ PO TBCR
40.0000 meq | EXTENDED_RELEASE_TABLET | Freq: Once | ORAL | Status: AC
  Administered 2023-05-09: 40 meq via ORAL
  Filled 2023-05-09: qty 2

## 2023-05-09 MED ORDER — FUROSEMIDE 10 MG/ML IJ SOLN
40.0000 mg | Freq: Once | INTRAMUSCULAR | Status: AC
  Administered 2023-05-09: 40 mg via INTRAVENOUS

## 2023-05-09 MED ORDER — SENNOSIDES-DOCUSATE SODIUM 8.6-50 MG PO TABS
1.0000 | ORAL_TABLET | Freq: Two times a day (BID) | ORAL | Status: DC | PRN
  Administered 2023-05-13: 1 via ORAL
  Filled 2023-05-09: qty 1

## 2023-05-09 MED ORDER — ARFORMOTEROL TARTRATE 15 MCG/2ML IN NEBU
15.0000 ug | INHALATION_SOLUTION | Freq: Two times a day (BID) | RESPIRATORY_TRACT | Status: DC
  Administered 2023-05-09 – 2023-05-14 (×10): 15 ug via RESPIRATORY_TRACT
  Filled 2023-05-09 (×11): qty 2

## 2023-05-09 MED ORDER — FUROSEMIDE 10 MG/ML IJ SOLN
40.0000 mg | Freq: Two times a day (BID) | INTRAMUSCULAR | Status: AC
  Administered 2023-05-09 – 2023-05-10 (×2): 40 mg via INTRAVENOUS
  Filled 2023-05-09 (×2): qty 4

## 2023-05-09 NOTE — TOC Progression Note (Signed)
Transition of Care [facility]) - Progression Note    Patient Details  Name: Angela Phillips MRN: [id number] Date of Birth: 1946-01-12  Transition of Care [facility]) CM/SW Contact  Elliot Cousin, RN Phone Number: [phone] 05/09/2023, 4:50 PM  Clinical Narrative:   CM spoke to pt and she declines HH. Pt states her husband is at home to assist. She has RW, scale, and cane at home. Will continue to follow for dc.    Expected Discharge Plan: Home w Home Health Services Barriers to Discharge: Continued Medical Work up  Expected Discharge Plan and Services   Discharge Planning Services: CM Consult Post Acute Care Choice: Home Health Living arrangements for the past 2 months: Single Family Home                                       Social Determinants of Health (SDOH) Interventions SDOH Screenings   Food Insecurity: No Food Insecurity (05/07/2023)  Housing: Low Risk  (05/07/2023)  Transportation Needs: No Transportation Needs (05/07/2023)  Utilities: Not At Risk (05/07/2023)  Alcohol Screen: Low Risk  (08/20/2022)  Depression (PHQ2-9): Low Risk  (08/20/2022)  Financial Resource Strain: Low Risk  (08/20/2022)  Physical Activity: Inactive (08/20/2022)  Social Connections: Moderately Isolated (08/20/2022)  Stress: Stress Concern Present (08/20/2022)  Tobacco Use: Medium Risk (04/30/2023)    Readmission Risk Interventions     No data to display

## 2023-05-09 NOTE — Progress Notes (Signed)
   05/09/23 2200  BiPAP/CPAP/SIPAP  Reason BIPAP/CPAP not in use (S)  Non-compliant (Pt refused)

## 2023-05-09 NOTE — Plan of Care (Signed)

## 2023-05-09 NOTE — Progress Notes (Addendum)
Rounding Note    Patient Name: Angela Phillips Date of Encounter: 05/09/2023  [facility] Cardiologist: Josiah Lobo; [place]   Subjective   No CP    Hard time breathign   Inpatient Medications    Scheduled Meds:  arformoterol  15 mcg Nebulization BID   budesonide (PULMICORT) nebulizer solution  0.25 mg Nebulization BID   Chlorhexidine Gluconate Cloth  6 each Topical Daily   FLUoxetine  20 mg Oral BID   heparin  5,000 Units Subcutaneous Q8H   pantoprazole  40 mg Oral Daily   pravastatin  80 mg Oral QHS   sodium chloride flush  3 mL Intravenous Q12H   Continuous Infusions:  sodium chloride     sodium chloride     dextrose 5% lactated ringers Stopped (05/08/23 0913)   DOBUTamine 10 mcg/kg/min (05/09/23 0600)   PRN Meds: sodium chloride, albuterol, clonazePAM, docusate sodium, ondansetron (ZOFRAN) IV, polyethylene glycol, sodium chloride flush   Vital Signs    Vitals:   05/09/23 0400 05/09/23 0500 05/09/23 0600 05/09/23 0638  BP: 138/65 (!) 143/60 (!) 156/76   Pulse: 73 94 94   Resp: (!) 29 (!) 24 (!) 24   Temp:    98.6 F (37 C)  TempSrc:    Oral  SpO2: 98% 99% 98%   Weight:  50.2 kg    Height:        Intake/Output Summary (Last 24 hours) at 05/09/2023 0827 Last data filed at 05/09/2023 0600 Gross per 24 hour  Intake 253.37 ml  Output 425 ml  Net -171.63 ml      05/09/2023    5:00 AM 05/08/2023    5:00 AM 05/07/2023    2:10 AM  Last 3 Weights  Weight (lbs) 110 lb 10.7 oz 101 lb 13.6 oz 89 lb 4.6 oz  Weight (kg) 50.2 kg 46.2 kg 40.5 kg      Telemetry    SR 90s    - Personally Reviewed  ECG    NO new Personally Reviewed  Physical Exam   GEN  Thin 77 yo who is anxious  Neck  JVP is increased     Cardiac: RRR  No S3    Respiratory:  Wheezes  Ext  No LE  edema;  Feet warm    Neuro   CN II-XII intact   Pt alert Labs    High Sensitivity Troponin:   Recent Labs  Lab 04/15/23 1627 05/06/23 1354 05/06/23 1528  TROPONINIHS 9 10 10       Chemistry Recent Labs  Lab 05/07/23 0754 05/07/23 0832 05/07/23 1550 05/08/23 0739 05/08/23 1257 05/08/23 1302 05/08/23 1303 05/08/23 1441 05/09/23 0356  NA  --  138 135 136   < > 139 140  --  135  K  --  5.4* 4.7 4.9   < > 4.5 4.3  --  4.1  CL  --  105 104 105  --   --   --   --  101  CO2  --  19* 21* 21*  --   --   --   --  24  GLUCOSE  --  140* 145* 142*  --   --   --   --  120*  BUN  --  24* 25* 28*  --   --   --   --  30*  CREATININE  --  1.60* 1.31* 1.14*  --   --   --  1.07* 1.00  CALCIUM  --  10.2 9.4 9.0  --   --   --   --  9.1  MG 1.8  --   --  2.2  --   --   --   --  2.1  PROT  --  5.7* 5.7* 5.7*  --   --   --   --   --   ALBUMIN  --  3.1* 3.0* 3.0*  --   --   --   --   --   AST  --  221* 378* 486*  --   --   --   --   --   ALT  --  185* 346* 538*  --   --   --   --   --   ALKPHOS  --  109 94 90  --   --   --   --   --   BILITOT  --  0.9 0.8 1.2  --   --   --   --   --   GFRNONAA  --  33* 42* 50*  --   --   --  54* 58*  ANIONGAP  --  14 10 10   --   --   --   --  10   < > = values in this interval not displayed.    Lipids  Recent Labs  Lab 05/08/23 0218  CHOL 118  TRIG 67  HDL 37*  LDLCALC 68  CHOLHDL 3.2    Hematology Recent Labs  Lab 05/06/23 1354 05/07/23 0754 05/08/23 1257 05/08/23 1302 05/08/23 1303 05/08/23 1441  WBC 8.1 11.3*  --   --   --  18.6*  RBC 4.41 4.88  --   --   --  4.43  HGB 12.4 13.0   < > 12.2 12.2 12.2  HCT 39.7 42.7   < > 36.0 36.0 39.3  MCV 90.0 87.5  --   --   --  88.7  MCH 28.1 26.6  --   --   --  27.5  MCHC 31.2 30.4  --   --   --  31.0  RDW 16.7* 17.0*  --   --   --  17.2*  PLT 321 246  --   --   --  166   < > = values in this interval not displayed.   Thyroid  Recent Labs  Lab 05/06/23 1354  TSH 8.729*  FREET4 1.17*    BNP Recent Labs  Lab 05/07/23 0754 05/08/23 0218 05/09/23 0356  BNP 1,586.0* 1,309.3* 1,226.0*    DDimer  Recent Labs  Lab 05/06/23 1354  DDIMER 0.99*     Radiology    CARDIAC  CATHETERIZATION  Result Date: 05/09/2023   Prox LAD to Mid LAD lesion is 50% stenosed.   1st Diag lesion is 75% stenosed.   Ost Cx to Prox Cx lesion is 50% stenosed.   1st Mrg lesion is 80% stenosed.   Ost RCA lesion is 50% stenosed.   Prox RCA-1 lesion is 50% stenosed.   Prox RCA-2 lesion is 90% stenosed.   LV end diastolic pressure is moderately elevated.   Hemodynamic findings consistent with moderate pulmonary hypertension. 3 vessel CAD. Coronary vessels are very small in caliber. 75% diffuse first diagonal. 50% mid LAD, 80% OM1, 90% mid RCA Moderately elevated LV filling pressures. PCWP 33/30 with mean 27 mm Hg. LVEDP 27 mm Hg Moderate pulmonary HTN. PAP 54/24 mean 37 mm Hg High RA pressures with  large V wave c/w severe TR. RA pressure 42/32 mean 25 mm Hg Low cardiac output. PA sat 48%, cardiac output 2.37 L/min with index 1.67. Plan: medical management. The RCA is only about 2- 2.25 mm in diameter. The larger issue appears to be cardiogenic shock and RV failure.   DG CHEST PORT 1 VIEW  Result Date: 05/08/2023 CLINICAL DATA:  Encounter for central venous line placement. EXAM: PORTABLE CHEST 1 VIEW COMPARISON:  Chest x-ray 05/06/2023. FINDINGS: Left subclavian approach central venous catheter with the tip projecting at the superior cavoatrial junction. No visible pneumothorax. Small to moderate left pleural effusion with overlying left basilar opacities. Cardiomediastinal silhouette is mildly enlarged. IMPRESSION: 1. Left subclavian approach central venous catheter with the tip projecting at the superior cavoatrial junction. 2. Small to moderate left pleural effusion with overlying left basilar opacities. Electronically Signed   By: Feliberto Harts M.D.   On: 05/08/2023 17:59   [place] EKG SITE RITE  Result Date: 05/08/2023 If Site Rite image not attached, placement could not be confirmed due to current cardiac rhythm.  US Abdomen Limited RUQ (LIVER/GB)  Result Date: 05/07/2023 CLINICAL DATA:  Right  upper quadrant pain EXAM: ULTRASOUND ABDOMEN LIMITED RIGHT UPPER QUADRANT COMPARISON:  None Available. FINDINGS: Gallbladder: Surgically removed Common bile duct: Diameter: 3.4 mm. Liver: Mild increase in echogenicity likely related to fatty infiltration. No focal mass is seen. Portal vein is patent on color Doppler imaging with normal direction of blood flow towards the liver. Other: Note is made of a small right-sided pleural effusion. IMPRESSION: Fatty liver. Right effusion. Electronically Signed   By: Alcide Clever M.D.   On: 05/07/2023 21:19   ECHOCARDIOGRAM LIMITED  Result Date: 05/07/2023    ECHOCARDIOGRAM LIMITED REPORT   Patient Name:   Angela Phillips Date of Exam: 05/07/2023 Medical Rec #:  [id number]        Height:       61.0 in Accession #:    [id number]       Weight:       89.3 lb Date of Birth:  Aug 15, 1946         BSA:          1.341 m Patient Age:    77 years         BP:           133/72 mmHg Patient Gender: F                HR:           55 bpm. Exam Location:  Inpatient Procedure: Cardiac Doppler, Limited Echo and Limited Color Doppler Indications:    Chest Pain R07.9  History:        Patient has prior history of Echocardiogram examinations, most                 recent 02/19/2023. Cancer and COPD, Arrythmias:Bradycardia,                 Signs/Symptoms:Hypotension and Chest Pain; Risk                 Factors:Hypertension, Former Smoker and Dyslipidemia.  Sonographer:    Aron Baba Referring Phys: [id number] Reginia Battie V Mckayla Mulcahey  Sonographer Comments: Technically difficult study due to poor echo windows. IMPRESSIONS  1. Techincally diffculty study with limited views. Left ventricular ejection fraction, by estimation, is 55 to 60%. The left ventricle has normal function. Left ventricular endocardial border not optimally defined to evaluate regional wall motion.  2. Right ventricular systolic function is normal. The right ventricular size is moderately enlarged.  3. Tricuspid valve regurgitation is moderate.  FINDINGS  Left Ventricle: Left ventricular ejection fraction, by estimation, is 55 to 60%. The left ventricle has normal function. Left ventricular endocardial border not optimally defined to evaluate regional wall motion. The left ventricular internal cavity size was small. There is no left ventricular hypertrophy. Right Ventricle: The right ventricular size is moderately enlarged. Right ventricular systolic function is normal. Pericardium: There is no evidence of pericardial effusion. Mitral Valve: The mitral valve is normal in structure. Tricuspid Valve: The tricuspid valve is normal in structure. Tricuspid valve regurgitation is moderate. Additional Comments: Spectral Doppler performed. Color Doppler performed.  LEFT VENTRICLE PLAX 2D LVIDd:         2.80 cm LVIDs:         1.40 cm LV PW:         0.70 cm LV IVS:        0.40 cm  LEFT ATRIUM         Index LA diam:    2.70 cm 2.01 cm/m Epifanio Lesches MD Electronically signed by Epifanio Lesches MD Signature Date/Time: 05/07/2023/4:29:22 PM    Final     Cardiac Studies   R/L heart cath   May 07, 2023    Prox LAD to Mid LAD lesion is 50% stenosed.   1st Diag lesion is 75% stenosed.   Ost Cx to Prox Cx lesion is 50% stenosed.   1st Mrg lesion is 80% stenosed.   Ost RCA lesion is 50% stenosed.   Prox RCA-1 lesion is 50% stenosed.   Prox RCA-2 lesion is 90% stenosed.   LV end diastolic pressure is moderately elevated.   Hemodynamic findings consistent with moderate pulmonary hypertension.   3 vessel CAD. Coronary vessels are very small in caliber. 75% diffuse first diagonal. 50% mid LAD, 80% OM1, 90% mid RCA Moderately elevated LV filling pressures. PCWP 33/30 with mean 27 mm Hg. LVEDP 27 mm Hg Moderate pulmonary HTN. PAP 54/24 mean 37 mm Hg High RA pressures with large V wave c/w severe TR. RA pressure 42/32 mean 25 mm Hg Low cardiac output. PA sat 48%, cardiac output 2.37 L/min with index 1.67.    RA 42/28   RV 59/9  RVEDP 24   PA 54/24  (mean 37)  PCWP mean 27    LVEDP 27    CO2.37   CI 1.67    Large V wave in RA    Plan: medical management. The RCA is only about 2- 2.25 mm in diameter. The larger issue appears to be cardiogenic shock and RV failure.   Limited echo 04/17/23   1. Techincally diffculty study with limited views. Left ventricular  ejection fraction, by estimation, is 55 to 60%. The left ventricle has  normal function. Left ventricular endocardial border not optimally defined  to evaluate regional wall motion.   2. Right ventricular systolic function is normal. The right ventricular  size is moderately enlarged.   3. Tricuspid valve regurgitation is moderate.   Echo   June 2024   1. Left ventricular ejection fraction, by estimation, is 60 to 65%. The  left ventricle has normal function. The left ventricle has no regional  wall motion abnormalities. Left ventricular diastolic parameters are  consistent with Grade I diastolic  dysfunction (impaired relaxation). GLS-19.4%   2. Right ventricular systolic function is normal. The right ventricular  size is severely enlarged. There is normal  pulmonary artery systolic  pressure.   3. Right atrial size was severely dilated.   4. The mitral valve is normal in structure. No evidence of mitral valve  regurgitation. No evidence of mitral stenosis.   5. Tricuspid valve regurgitation is moderate.   6. The aortic valve is normal in structure. Aortic valve regurgitation is  mild. No aortic stenosis is present.   7. The inferior vena cava is normal in size with greater than 50%  respiratory variability, suggesting right atrial pressure of 3 mmHg.      Monitor June 2024     The dominant rhythm is normal sinus rhythm with normal circadian variation.   There are rare premature atrial and premature ventricular beats.  There is a single 11-beat run of nonsustained supraventricular tachycardia, most likely ectopic atrial tachycardia.   Premature ventricular contractions  there is no evidence of complex ventricular arrhythmia.   There is no evidence of significant bradycardia or pauses.   There are rare blocked premature atrial contractions, followed by junctional escape beats.   Several symptom-triggered recordings are submitted, but these show normal sinus rhythm.   Essentially normal arrhythmia monitor. There is no correlation between patient's symptoms and any rhythm abnormalities.  Myoview 2021   The left ventricular ejection fraction is hyperdynamic (>65%). This is a low risk study. The study is normal.    Patient Profile      Angela Phillips is a 77 y.o. female with a hx of CAD (on CT scan)   who is being seen 05/06/2023 for the evaluation of bradycardia  at the request of Dr Suezanne Jacquet .   Assessment & Plan    1  Bradycardia  Pt presented for CCTA 2 days ago  Took metoprolol prior to coming to hospital   In waiting area became N/, profoundly bradycardic, hypotensive Sent to ER    Pt denied mix up in meds  Did not take dilt on am of test  She remained in a persistent junctional rhythm for 2 days until last night after dobutamine started after cath     Now in SR  90s   BP improved  EP has seen pt  Plan for pacemaker  today (atrially pace)  2  CAD   Cardiac catheterization yesterday showed severe 3 V CAD  RCA particularly tight in prox region  See above   Vessels small however. Not felt to be targets for percutaneous intervention  Plan for medical Rx   She remains CP free, even at higher HR  3   R sided HFrEF   Pt with RV dysfunction and severe TR.  R heart pressures show above  Echo shows LV function normal  RV is depressed but does not appear as bad as numbers suggest    ? Related to low heart rate, TR Pt with audible wheezing today   Will diurese with IV lasix    REpeat COOX now that pressures/HR improve  Repeat lactic acid and LFTs   Follow clinically now that in SR     ? 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

## 2023-05-09 NOTE — Consult Note (Addendum)
ELECTROPHYSIOLOGY CONSULT NOTE    Patient ID: Angela Phillips MRN: [id number], DOB/AGE: [birth year] 77 y.o.  Admit date: 05/06/2023 Date of Consult: 05/09/2023  Primary Physician: Hurshel Party, NP Primary Cardiologist: None  Electrophysiologist: Dr. Ladona Ridgel (Last seen 10/2018 for SVT ablation )  Referring Provider: Dr. Tenny Craw  Patient Profile: Angela Phillips is a 77 y.o. female with a history of CAD on CT scan, accelerated junctional rhythm (noted as far back as 2019), ? SVT, COPD, HTN, HLD, and atypical chest pain who is being seen today for the evaluation of junctional rhythm at the request of Dr. Tenny Craw.  HPI:  Angela Phillips is a 77 y.o. female with medical history as above.   Pt had episodes of heart racing with tachy palpitations in her neck and chest pressure radiating into her left arm, with at least once syncopal episode due to the severity.   Spells are associated with SOB and can last from seconds up to 20 minutes. First the tachy-palpitations and then the pressure, which eases as the racing stops.   Scheduled for Coronary CT 8/27 and given pre-dose metoprolol. On the way to imaging she became weak, with vomiting and diaphoresis, apparently became bradycardic into the 20-30s and was sent to the ER.    Diltiazem held.   She remained in a junctional bradycardia with rates gradually improved into the 50s on Dopamine. H/o accelerated junctional rhythm going back to at least 2019.  Echo 05/07/2023 LVEF 55-60%, normal RV function, At least moderate TR.   [facility] 05/08/2023 showed severe 3v CAD with very small caliber vessels. Cardiac output low with PA sat 48%, CO/CI 2.37/1.67  CVL placed with initial Coox 42%, switched to Dobutamine with increase of her junctional rate to 70s and ultimately converted to NSR in the 90s.   EP asked to see this am to weigh in on her need for pacing.   Feeling Ok this am, having a little forgetfulness about the events. She does not think the heart  racing episodes were the same as when she was having SVT years ago.  She was having those symptoms every other day at least, daily at times.  No SOB this am, though remains on O2 via [place].    Labs Potassium4.1 (08/30 0356) Magnesium  2.1 (08/30 0356) Creatinine, ser  1.00 (08/30 0356) PLT  166 (08/29 1441) HGB  12.2 (08/29 1441) WBC 18.6* (08/29 1441) Troponin I (High Sensitivity)10 (08/27 1528).    Allergies, Medical, Surgical, Social, and Family Histories have been reviewed and are referenced here-in when relevant for medical decision making.     Physical Exam: Vitals:   05/09/23 0638 05/09/23 0700 05/09/23 0800 05/09/23 0900  BP:  (!) 144/69 136/68 (!) 147/131  Pulse:  92 97 99  Resp:  (!) 24 (!) 23 (!) 25  Temp: 98.6 F (37 C)     TempSrc: Oral     SpO2:  99% 97% 98%  Weight:      Height:        GEN- NAD, A&O x 3, normal affect HEENT: Normocephalic, atraumatic Lungs- CTAB, Normal effort.  Heart- Regular rate and rhythm, No M/G/R.  GI- Soft, NT, ND.  Extremities- No clubbing, cyanosis, or edema   EKG:on admission showed junctional bradycardia at 41 bpm (personally reviewed)  Baseline EKG 8/21 showed NSR at 65 bpm, PR interval 140 ms, QRS 110 ms  TELEMETRY: Junctional bradycardia/Accelerated junctional rhythm 50-70s that since yesterday has transitioned to NSR in the 90s (  personally reviewed)  Assessment/Plan:  Accelerated junctional rhythm Per notes she has previously not tolerated beta blockers due to symptomatic brady and junctional rhythm as far back as 11/2017 HRs gradually improved to junctional in 50s > 70s > Dobutamine started with low coox > NSR 90s this am.  With sinus rhythm currently, would defer pacing for now.   Would wean dobutamine as tolerated over the weekend to see how her rhythm dose.  If she requires pacing, Per Dr. Ladona Ridgel would plan Atrial lead only given her Tricuspid valve disease.   CAD Severe 3v CAD with up to a 90% mid RCA lesion.  Small  diameter not felt to be very amenable to PCI Planning medical management.  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 Recurrence with her tachy-palpitations prior to arrival.  Though has not yet recurred despite dobutamine.   Moderate TR  Thought has been to try and avoid RV lead / crossing the valve, so potentially planning Single chamber atrial lead if pacing needed  Acute on chronic hypoxic resp failure Shock, multifactorial RV dysfunction and severe TR by RHC Remains on O2 via [place] (uses at night at home)  Dr. Ladona Ridgel has seen. Plan to observe with pressor wean over the weekend.   For questions or updates, please contact [facility] Please consult [email or web address] for contact info under Cardiology/STEMI.  Dustin Flock, PA-C  05/09/2023 9:48 AM  EP Attending  Patient seen and examined. Agree with the findings as noted above by Mr. Tillery. The patient is a pleasant woman well known to me from prior SVT ablation over 4 years ago. She has had palpitations and chest pressure. She has known sinus node dysfunction and has been intolerant of Sinus nodal blocking drugs. She has fairly severe TR. She was admitted with a low output state and junctional bradycardia. She underwent left heart cath with very small vessels but 3V CAD. She has been treated with IV dobutamine and her CO and bp and HR are all improved. On exam she is a diskempt appearing woman, NAD. Lungs reveal scattered rales and CV with a RRR. Ext are warm on my exam. Tele reveals NSR at 90/min.  A/P Sinus node dysfunction - she may ultimately require pacing. Hopefully her heart rate will be maintained after weaning of dobutamine. If not an atrial lead/single chamber PPM would be recommended.  Low cardiac output - this is much improved. Continue supportive care. 3 Vessel CAD - note plans for medical therapy.   Sharlot Gowda Novella Abraha,MD

## 2023-05-09 NOTE — Progress Notes (Signed)
   NAME:  Angela Phillips, MRN:  [id number], DOB:  11/26/1945, LOS: 3 ADMISSION DATE:  05/06/2023, CONSULTATION DATE:  05/06/23 REFERRING MD:  Marisue Humble, CHIEF COMPLAINT:  Symptomatic bradycardia   History of Present Illness:  Ms. Angela Phillips is a 77 year-old female with a history of atypical chest pain, asthma, lung adenocarcinoma s/p radiation therapy, COPD, hyperlipidemia, hypertension, and SVT presented to the ED with chest pain, dizziness, nausea. She was seen by Cardiology on 8/21 for frequent episodes (5-6x a day) of racing heart, felt radiating to her neck and left arm with associated with chest pressure. She had one episode of syncope with a fall on her sofa, noted worsening lower extremity edema. She uses supplemental oxygen at home at night, but was using it during the day during these episodes. On 8/27, she was on her way to CT coronary angiography as recommended by Cardiology, took metoprolol on the way, and became weak, nauseated, diaphoretic, prompting presentation to the ED.   In the ED, she was noted to have elevated Lactate, TSH, free T4. She was given IV fluids, tried Trendelenburg positioning with no improvement in hypotension or bradycardia. She was transferred to the ICU for vasoactive support.    Pertinent  Medical History  Junctional bradycardia  Cystitis Asthma Atypical chest pain  RLL adenocarcinoma treated with radiation therapy  COPD Hypertension, Hyperlipidemia  SVT  Breast cancer   Significant Hospital Events: Including procedures, antibiotic start and stop dates in addition to other pertinent events   8/27 admitted w/ cc: CP, dizziness and nausea. HR 30s. Had taken 50mg  metoprolol for first time at 1130 earlier that day. Remained bradycardic at 2230 after fluid challenge  8/28 HR improved to 45-50 on dopamine   Interim History / Subjective:  Just waking up.  Lots of "wheezing" when falling asleep. Denies chest pain.  Objective   Blood pressure (!) 156/76, pulse  94, temperature 98.6 F (37 C), temperature source Oral, resp. rate (!) 24, height 5\' 1"  (1.549 m), weight 50.2 kg, SpO2 98%.        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 Left pleural effusion Congestive hepatopathy noted Volume overload  - AHF to see, inotrope+ diuretic push needed - May need therapeutic/diagnostic tap on left if does not respond - Trial of at bedtime CPAP if patient willing - Can try brovana+pulmicort for her asthma although may just be cardiac wheeze  Best Practice (right click and "Reselect all SmartList Selections" daily)   Diet/type: NPO w/ oral meds DVT prophylaxis: prophylactic heparin  GI prophylaxis: PPI Lines: N/A Foley:  N/A Code Status:  full code Last date of multidisciplinary goals of care discussion [[name]]  33 min cc time [name] MD PCCM [facility] See Amion for pager If no response to pager, please call [phone] until 7pm After 7pm, Please call E-link [phone]

## 2023-05-09 NOTE — Consult Note (Addendum)
Advanced Heart Failure Team Consult Note   Primary Physician: Hurshel Party, NP PCP-Cardiologist:  None  Reason for Consultation: Cardiogenic shock  HPI:    Angela Phillips is seen today for evaluation of cardiogenic shock at the request of Dr. Katrinka Blazing with PCCM. 77 yo. Female with history of atypical chest pain, asthma, lung adenocarcinoma s/p radiation therapy, COPD/asthma, SVT s/p ablation, RA, prior subdural hematoma.   Recently seen by Cardiology on 04/30/23 with episodes of racing heart beat with radiation to neck and left arm with associated chest pressure. Had one episode of syncope resulting in fall on her sofa. Also noted lower extremity edema.   On 08/27, she was scheduled for coronary CTA. Took metoprolol pre-test and became weak, nauseated and diaphoretic.  She presented to ED. Lactic acid elevated at 2.0. HS troponin negative. TSH 8.7, Free T4 1.17, BMET 1.04, CO2 26. Bradycardic with rate in 30s. No improvement with fluids and Trendelenburg position, started on dopamine infusion and admitted to ICU. CTA chest negative for PE, volume loss left upper and right lower lobe from prior radiation therapy  Echo 05/07/23: EF 55-60%, RV function reportedly normal, TR moderate  R/LHC 08/29: 3 v CAD (small caliber), RA mean 31, PA mean 37, PCWP mean 27 w/ large V wave consistent with severe TR, Fick CO/CI 2.37/1.67  PICC line placed. Initial CO-OX 42%. She was started on DBA which was titrated up to to 10 mcg/kg/min.   More short of breath this am with wheezing. Given nebulizers and 40 mg lasix IV.   Had junctional rhythm for 2 days, now NSR 90s.   Home Medications Prior to Admission medications   Medication Sig Start Date End Date Taking? Authorizing Provider  albuterol (PROVENTIL) (2.5 MG/3ML) 0.083% nebulizer solution Take 2.5 mg by nebulization every 6 (six) hours as needed for wheezing or shortness of breath.   Yes [provider]  albuterol (VENTOLIN HFA) 108 (90  Base) MCG/ACT inhaler Inhale 2 puffs into the lungs every 6 (six) hours as needed for wheezing or shortness of breath.   Yes [provider]  celecoxib (CELEBREX) 200 MG capsule Take 200 mg by mouth 2 (two) times daily. 07/04/22  Yes [provider]  clonazePAM (KLONOPIN) 1 MG tablet Take 1 mg by mouth every 6 (six) hours as needed for anxiety.   Yes [provider]  diclofenac (VOLTAREN) 75 MG EC tablet Take 75 mg by mouth 2 (two) times daily. 05/03/22  Yes [provider]  diltiazem (CARDIZEM CD) 120 MG 24 hr capsule Take 120 mg by mouth daily. 04/14/23  Yes [provider]  FLUoxetine (PROZAC) 20 MG capsule Take 20 mg by mouth 2 (two) times daily.   Yes [provider]  gabapentin (NEURONTIN) 300 MG capsule Take 300 mg by mouth at bedtime.   Yes [provider]  metoprolol tartrate (LOPRESSOR) 50 MG tablet Take 50 mg Lopressor 2 hours prior to your CT scan for a heart rate greater than 55. Patient taking differently: Take 50 mg by mouth See admin instructions. Take 50 mg by mouth 2 hours prior to your CT scan for a heart rate greater than 55. 04/30/23  Yes Revankar, Aundra Dubin, MD  Multiple Vitamin (MULTI-VITAMINS) TABS Take 1 tablet by mouth daily. Centrum silver   Yes [provider]  omeprazole (PRILOSEC) 40 MG capsule Take 40 mg by mouth daily. 08/13/20  Yes [provider]  simvastatin (ZOCOR) 40 MG tablet Take 40 mg by  mouth at bedtime.   Yes [provider]    Past Medical History: 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, treated with SBRT  Clinical stage I      COPD (chronic obstructive pulmonary disease) (HCC)    Dyslipidemia (high LDL; low HDL) 08/13/2016   Dysuria 28/41/3244   Essential hypertension 07/05/2016   Falls 11/20/2016   H/O ultrasound guided needle biopsy of lung 08/24/2020   History of radiation therapy 10/24/2020-11/06/2020   SBRT to left and right lungs   Dr Antony Blackbird   HTN (hypertension) 11/20/2016   Hyperlipidemia 04/29/2023   Hypokalemia 07/23/2021   Impaired mobility and ADLs 10/28/2017   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   Osteoporosis 07/12/2022   Palpitations 07/05/2016   Paroxysmal SVT (supraventricular tachycardia) 07/05/2016   Personal history of malignant neoplasm of breast 04/29/2023   Pneumothorax on right 08/24/2020   Rheumatoid arthritis (HCC) 04/29/2023   Subdural hematoma (HCC) 11/20/2016   SVT (supraventricular tachycardia) 10/19/2018    Past Surgical History: Past Surgical History:  Procedure Laterality Date   ABDOMINAL HYSTERECTOMY     CARDIAC CATHETERIZATION     CARPAL TUNNEL RELEASE     CATARACT EXTRACTION     CHOLECYSTECTOMY     HEMORROIDECTOMY     JOINT REPLACEMENT     R knee   ORIF left elbow Left 12/2021   RIGHT/LEFT HEART CATH AND CORONARY ANGIOGRAPHY N/A 05/08/2023   Procedure: RIGHT/LEFT HEART CATH AND CORONARY ANGIOGRAPHY;  Surgeon: [place], Peter M, MD;  Location: [facility] INVASIVE CV LAB;  Service: Cardiovascular;  Laterality: N/A;   SVT ABLATION N/A 10/19/2018   Procedure: SVT ABLATION;  Surgeon: Marinus Maw, MD;  Location: [facility] INVASIVE CV LAB;  Service: Cardiovascular;  Laterality: N/A;   TOE SURGERY     VIDEO BRONCHOSCOPY WITH ENDOBRONCHIAL NAVIGATION  08/24/2020   VIDEO BRONCHOSCOPY WITH ENDOBRONCHIAL NAVIGATION N/A 08/24/2020   Procedure: VIDEO BRONCHOSCOPY WITH ENDOBRONCHIAL NAVIGATION;  Surgeon: Loreli Slot, MD;  Location: [facility] OR;  Service: Thoracic;  Laterality: N/A;    Family History: Family History  Problem Relation Age of  Onset   Thyroid disease Brother    Dementia Father    Heart disease Mother    Diabetes Mother    Thyroid disease Sister     Social History: Social History   Socioeconomic History   Marital status: Married    Spouse name: Not on file   Number of children: Not on file   Years of education: Not on file   Highest education level: Not on file  Occupational History   Not on file  Tobacco Use   Smoking status: Former    Current packs/day: 0.50    Average packs/day: 0.5 packs/day for 15.0 years (7.5 ttl pk-yrs)    Types: Cigarettes   Smokeless tobacco: Former    Quit date: 09/09/1989  Vaping Use   Vaping status: Never Used  Substance and Sexual Activity   Alcohol use: No   Drug use: No   Sexual activity: Not on file  Other Topics Concern   Not on file  Social History  Narrative   Not on file   Social Determinants of Health   Financial Resource Strain: Low Risk  (08/20/2022)   Overall Financial Resource Strain (CARDIA)    Difficulty of Paying Living Expenses: Not very hard  Food Insecurity: No Food Insecurity (05/07/2023)   Hunger Vital Sign    Worried About Running Out of Food in the Last Year: Never true    Ran Out of Food in the Last Year: Never true  Transportation Needs: No Transportation Needs (05/07/2023)   PRAPARE - Administrator, Civil Service (Medical): No    Lack of Transportation (Non-Medical): No  Physical Activity: Inactive (08/20/2022)   Exercise Vital Sign    Days of Exercise per Week: 0 days    Minutes of Exercise per Session: 0 min  Stress: Stress Concern Present (08/20/2022)   [employer] of Occupational Health - Occupational Stress Questionnaire    Feeling of Stress : To some extent  Social Connections: Moderately Isolated (08/20/2022)   Social Connection and Isolation Panel [NHANES]    Frequency of Communication with Friends and Family: More than three times a week    Frequency of Social Gatherings with Friends and Family: More than  three times a week    Attends Religious Services: Never    Database administrator or Organizations: No    Attends Banker Meetings: Never    Marital Status: Married    Allergies:  Allergies  Allergen Reactions   Aspirin Other (See Comments)    Bleeding (intolerance)   Atorvastatin     Other Reaction(s): Unknown   Buspirone Other (See Comments)    Patient does not recall taking this med   Celecoxib Other (See Comments)    Patient cannot remember reaction   Chlorpheniramine Other (See Comments)    Patient cannot remember reaction   Citalopram Other (See Comments)    Patient cannot remember reaction   Ezetimibe Other (See Comments)    Myalgias (intolerance)   Ezetimibe-Simvastatin     Other Reaction(s): Unknown   Hydrocodone-Acetaminophen Itching   Prochlorperazine Other (See Comments)    "Makes me feel like I'm having a face stroke"   Rosuvastatin     Other Reaction(s): Unknown   Statins Other (See Comments)    Myalgias (intolerance)   Venlafaxine Other (See Comments)    Patient cannot remember reaction    Objective:    Vital Signs:   Temp:  [97.8 F (36.6 C)-98.6 F (37 C)] 98.6 F (37 C) (08/30 1610) Pulse Rate:  [0-99] 99 (08/30 0900) Resp:  [14-31] 25 (08/30 0900) BP: (82-156)/(55-131) 147/131 (08/30 0900) SpO2:  [78 %-100 %] 98 % (08/30 0900) Weight:  [50.2 kg] 50.2 kg (08/30 0500) Last BM Date : 05/06/23  Weight change: Filed Weights   05/07/23 0210 05/08/23 0500 05/09/23 0500  Weight: 40.5 kg 46.2 kg 50.2 kg    Intake/Output:   Intake/Output Summary (Last 24 hours) at 05/09/2023 0913 Last data filed at 05/09/2023 0900 Gross per 24 hour  Intake 274.18 ml  Output 425 ml  Net -150.82 ml      Physical Exam    General:  Frail, chronically ill appearing female HEENT: normal Neck: supple. JVP to jaw . Carotids 2+ bilat; no bruits.  Cor: PMI nondisplaced. Regular rate & rhythm, tachy. No rubs, gallops, + TR Lungs: wheezes  throughout Abdomen: soft, nontender, nondistended.  Extremities: no cyanosis, clubbing, rash, edema Neuro: alert & oriented x 3. Affect anxious.    Telemetry  Currently SR 90s  Labs   Basic Metabolic Panel: Recent Labs  Lab 05/06/23 1354 05/07/23 0754 05/07/23 0832 05/07/23 1550 05/08/23 0739 05/08/23 1257 05/08/23 1258 05/08/23 1302 05/08/23 1303 05/08/23 1441 05/09/23 0356  NA 138  --  138 135 136 139 139 139 140  --  135  K 4.3  --  5.4* 4.7 4.9 4.6 4.5 4.5 4.3  --  4.1  CL 97*  --  105 104 105  --   --   --   --   --  101  CO2 26  --  19* 21* 21*  --   --   --   --   --  24  GLUCOSE 131*  --  140* 145* 142*  --   --   --   --   --  120*  BUN 18  --  24* 25* 28*  --   --   --   --   --  30*  CREATININE 1.04*  --  1.60* 1.31* 1.14*  --   --   --   --  1.07* 1.00  CALCIUM 9.3  --  10.2 9.4 9.0  --   --   --   --   --  9.1  MG  --  1.8  --   --  2.2  --   --   --   --   --  2.1    Liver Function Tests: Recent Labs  Lab 05/07/23 0832 05/07/23 1550 05/08/23 0739  AST 221* 378* 486*  ALT 185* 346* 538*  ALKPHOS 109 94 90  BILITOT 0.9 0.8 1.2  PROT 5.7* 5.7* 5.7*  ALBUMIN 3.1* 3.0* 3.0*   No results for input(s): "LIPASE", "AMYLASE" in the last 168 hours. No results for input(s): "AMMONIA" in the last 168 hours.  CBC: Recent Labs  Lab 05/06/23 1354 05/07/23 0754 05/08/23 1257 05/08/23 1258 05/08/23 1302 05/08/23 1303 05/08/23 1441  WBC 8.1 11.3*  --   --   --   --  18.6*  NEUTROABS 5.2  --   --   --   --   --   --   HGB 12.4 13.0 12.9 12.9 12.2 12.2 12.2  HCT 39.7 42.7 38.0 38.0 36.0 36.0 39.3  MCV 90.0 87.5  --   --   --   --  88.7  PLT 321 246  --   --   --   --  166    Cardiac Enzymes: No results for input(s): "CKTOTAL", "CKMB", "CKMBINDEX", "TROPONINI" in the last 168 hours.  BNP: BNP (last 3 results) Recent Labs    05/07/23 0754 05/08/23 0218 05/09/23 0356  BNP 1,586.0* 1,309.3* 1,226.0*    ProBNP (last 3 results) No results for  input(s): "PROBNP" in the last 8760 hours.   CBG: Recent Labs  Lab 05/07/23 0030  GLUCAP 135*    Coagulation Studies: No results for input(s): "LABPROT", "INR" in the last 72 hours.   Imaging   CARDIAC CATHETERIZATION  Result Date: 05/09/2023   Prox LAD to Mid LAD lesion is 50% stenosed.   1st Diag lesion is 75% stenosed.   Ost Cx to Prox Cx lesion is 50% stenosed.   1st Mrg lesion is 80% stenosed.   Ost RCA lesion is 50% stenosed.   Prox RCA-1 lesion is 50% stenosed.   Prox RCA-2 lesion is 90% stenosed.   LV end diastolic pressure is moderately elevated.   Hemodynamic findings consistent with  moderate pulmonary hypertension. 3 vessel CAD. Coronary vessels are very small in caliber. 75% diffuse first diagonal. 50% mid LAD, 80% OM1, 90% mid RCA Moderately elevated LV filling pressures. PCWP 33/30 with mean 27 mm Hg. LVEDP 27 mm Hg Moderate pulmonary HTN. PAP 54/24 mean 37 mm Hg High RA pressures with large V wave c/w severe TR. RA pressure 42/32 mean 25 mm Hg Low cardiac output. PA sat 48%, cardiac output 2.37 L/min with index 1.67. Plan: medical management. The RCA is only about 2- 2.25 mm in diameter. The larger issue appears to be cardiogenic shock and RV failure.   DG CHEST PORT 1 VIEW  Result Date: 05/08/2023 CLINICAL DATA:  Encounter for central venous line placement. EXAM: PORTABLE CHEST 1 VIEW COMPARISON:  Chest x-ray 05/06/2023. FINDINGS: Left subclavian approach central venous catheter with the tip projecting at the superior cavoatrial junction. No visible pneumothorax. Small to moderate left pleural effusion with overlying left basilar opacities. Cardiomediastinal silhouette is mildly enlarged. IMPRESSION: 1. Left subclavian approach central venous catheter with the tip projecting at the superior cavoatrial junction. 2. Small to moderate left pleural effusion with overlying left basilar opacities. Electronically Signed   By: Feliberto Harts M.D.   On: 05/08/2023 17:59   [place] EKG  SITE RITE  Result Date: 05/08/2023 If Site Rite image not attached, placement could not be confirmed due to current cardiac rhythm.    Medications:     Current Medications:  arformoterol  15 mcg Nebulization BID   budesonide (PULMICORT) nebulizer solution  0.25 mg Nebulization BID   Chlorhexidine Gluconate Cloth  6 each Topical Daily   FLUoxetine  20 mg Oral BID   heparin  5,000 Units Subcutaneous Q8H   pantoprazole  40 mg Oral Daily   sodium chloride flush  3 mL Intravenous Q12H    Infusions:  sodium chloride     sodium chloride     dextrose 5% lactated ringers Stopped (05/08/23 0913)   DOBUTamine 10 mcg/kg/min (05/09/23 0900)      Patient Profile   77 y.o. female with history of SVT s/p ablation, hx chest pain, lung adenocarcinoma s/p radiation therapy, RA, prior subdural hematoma, COPD/asthma. 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 Med management recommended. RHC with elevated R and L filling pressures, PA mean 37, w/ CI 1.67. Large V waves consistent with severe TR. -? D/t beta blocker use resulting in bradycardia s/p fluid resuscitation vs RV failure. ? untreated OSA.  Also has hx COPD, radiation for lung cancer and hx RA. ? Chronic respiratory failure. Apparently uses nocturnal O2 at home. -No PE on CTA chest -CO-OX 75% this am on 7.5 DBA. Continue for now. -Lactic acid peaked at 4.9 >>> down to 2.1 -CVP 15. Just received 40 mg lasix IV, diuresis picking up. Will start 40 mg IV BID  2. Bradycardia -Has not tolerated beta blockers in the past d/t history of symptomatic bradycardia/junctional rhythm -Accelerated  junctional rhythm for 2 days. Dobutamine started. Now NSR 90s  -EP Following  3. TR -Moderate on echo -Large V waves on RHC suggesting severe TR  4. Elevated LFTs -In setting of cardiogenic shock -Continue to trend  5. CAD -Diffuse 3 v CAD, vessels are very small in caliber -Managed medically -Will need aspirin. Statin once LFTs improved  6. Left pleural effusion - May need thoracentesis if does not improve with diuresis  Length of Stay: 3  FINCH, LINDSAY N, PA-C  05/09/2023, 9:13 AM  Advanced Heart Failure Team Pager [phone] (M-F; 7a - 5p)  Please contact [facility] for night-coverage after hours (4p -7a ) and weekends on [email or web address]   Patient seen with PA, agree with the above note.   History as outlined extensively above.  This morning, she was short of breath and wheezy.  Co-ox 75% on dobutamine 7.5, back in NSR in 90s.  She received Lasix 40 mg IV for the first time and has had about 2 L out, breathing is much better now.  CVP initially 15 this morning, now 8 on my read post-Lasix.   General: NAD Neck: JVP 10 cm, no thyromegaly or thyroid nodule.  Lungs: Clear to auscultation bilaterally with normal respiratory effort. CV: Nondisplaced PMI.  Heart regular S1/S2, no S3/S4, 2/6 HSM LLSB.  No peripheral edema.  No carotid bruit.  Normal pedal pulses.  Abdomen: Soft, nontender, no hepatosplenomegaly, no distention.  Skin: Intact without lesions or rashes.  Neurologic: Alert and oriented x 3.  Psych: Normal affect. Extremities: No clubbing or cyanosis.  HEENT: Normal.   1. Cardiogenic shock/RV failure: I reviewed patient's echo from 7/24, EF 60-65% with moderately dilated RV/normal RV function and moderate-severe TR.  Echo this admission reviewed, EF 60-65%, moderately dilated RV/low normal systolic function, moderate-severe TR.  It appears to me that she became hypotensive after getting metoprolol for coronary CTA and developing junctional bradycardia around 30.  She  was fluid resuscitated and started on dopamine, now switched to dobutamine.  She has converted to NSR in 90s. I think her volume overload/RV failure is related to fluid resuscitation with underlying moderate-severe TR, had no Lasix prior to this morning.  RHC numbers are somewhat surprising as her CVP readings have been significantly lower in CCU.  She feels much better with IV Lasix dose this morning, CVP down to 8 on my read.  - Would give Lasix 40 mg IV bid x 2 more doses then reassess.  - Decrease dobutamine to 5, continue to titrate down tomorrow. - I suspect that she will recover well as long as she stays in NSR.  2. Junctional bradycardia: Has had this in the past. Triggered by beta blockade for coronary CTA but was persistent.  HR around 30 with hypotension.  She is now back in NSR in 90s on dobutamine 7.5.  - Wean dobutamine to 5, decrease again tomorrow.  - No nodal blockers.  - Will follow rhythm after titrating off dobutamine.  If she has recurrent junctional bradycardia, favor atrial pacing to avoid worsening TR.  3. Tricuspid regurgitation: She has moderate-severe TR with mod-severely dilated RV and low normal RV function.  This pre-dated the current hospitalization (see 6/24 echo).  Cause is uncertain, no history of atrial fibrillation, no cardiac device. I suspect CHF this admission was due to worsening RV failure/TR in setting of volume resuscitation with bradycardia/hypotension.  - Diuresis as above.  4. CAD: Cath this admission with diffuse disease; 75% D1, 50% mLAD, 80% OM1, 90% mRCA.  The RCA was a small caliber vessel not amenable to PCI.  She does not have chest pain.  Medical management.  - Add ASA 81.  - Start statin when we see LFTs trend down (  check tomorrow).  5. Elevated LFTs: Suspect congestive hepatopathy. Repeat in am.   Marca Ancona 05/09/2023 11:42 AM

## 2023-05-10 ENCOUNTER — Inpatient Hospital Stay (HOSPITAL_COMMUNITY): Payer: Medicare HMO

## 2023-05-10 DIAGNOSIS — R001 Bradycardia, unspecified: Secondary | ICD-10-CM | POA: Diagnosis not present

## 2023-05-10 DIAGNOSIS — I495 Sick sinus syndrome: Secondary | ICD-10-CM

## 2023-05-10 DIAGNOSIS — I5033 Acute on chronic diastolic (congestive) heart failure: Secondary | ICD-10-CM | POA: Diagnosis not present

## 2023-05-10 DIAGNOSIS — R570 Cardiogenic shock: Secondary | ICD-10-CM | POA: Diagnosis not present

## 2023-05-10 LAB — HEPATIC FUNCTION PANEL
ALT: 395 U/L — ABNORMAL HIGH (ref 0–44)
AST: 190 U/L — ABNORMAL HIGH (ref 15–41)
Albumin: 2.8 g/dL — ABNORMAL LOW (ref 3.5–5.0)
Alkaline Phosphatase: 82 U/L (ref 38–126)
Bilirubin, Direct: 0.2 mg/dL (ref 0.0–0.2)
Indirect Bilirubin: 0.9 mg/dL (ref 0.3–0.9)
Total Bilirubin: 1.1 mg/dL (ref 0.3–1.2)
Total Protein: 5.3 g/dL — ABNORMAL LOW (ref 6.5–8.1)

## 2023-05-10 LAB — COOXEMETRY PANEL
Carboxyhemoglobin: 0.7 % (ref 0.5–1.5)
Carboxyhemoglobin: 0.7 % (ref 0.5–1.5)
Methemoglobin: 1.4 % (ref 0.0–1.5)
Methemoglobin: 2.9 % — ABNORMAL HIGH (ref 0.0–1.5)
O2 Saturation: 74 %
O2 Saturation: 55.3 %
Total hemoglobin: 11.2 g/dL — ABNORMAL LOW (ref 12.0–16.0)
Total hemoglobin: 13.1 g/dL (ref 12.0–16.0)

## 2023-05-10 LAB — BRAIN NATRIURETIC PEPTIDE: B Natriuretic Peptide: 558.1 pg/mL — ABNORMAL HIGH (ref 0.0–100.0)

## 2023-05-10 LAB — BASIC METABOLIC PANEL
Anion gap: 12 (ref 5–15)
BUN: 19 mg/dL (ref 8–23)
CO2: 31 mmol/L (ref 22–32)
Calcium: 8.7 mg/dL — ABNORMAL LOW (ref 8.9–10.3)
Chloride: 94 mmol/L — ABNORMAL LOW (ref 98–111)
Creatinine, Ser: 0.85 mg/dL (ref 0.44–1.00)
GFR, Estimated: >60 mL/min (ref >60)
Glucose, Bld: 94 mg/dL (ref 70–99)
Potassium: 3.5 mmol/L (ref 3.5–5.1)
Sodium: 137 mmol/L (ref 135–145)

## 2023-05-10 LAB — MAGNESIUM: Magnesium: 1.9 mg/dL (ref 1.7–2.4)

## 2023-05-10 MED ORDER — POTASSIUM CHLORIDE CRYS ER 20 MEQ PO TBCR
30.0000 meq | EXTENDED_RELEASE_TABLET | ORAL | Status: AC
  Administered 2023-05-10 (×3): 30 meq via ORAL
  Filled 2023-05-10 (×3): qty 1

## 2023-05-10 MED ORDER — FLUTICASONE PROPIONATE 50 MCG/ACT NA SUSP
1.0000 | Freq: Two times a day (BID) | NASAL | Status: DC | PRN
  Filled 2023-05-10: qty 16

## 2023-05-10 MED ORDER — PRAVASTATIN SODIUM 40 MG PO TABS
80.0000 mg | ORAL_TABLET | Freq: Every day | ORAL | Status: DC
  Administered 2023-05-10 – 2023-05-14 (×5): 80 mg via ORAL
  Filled 2023-05-10 (×5): qty 2

## 2023-05-10 MED ORDER — FLUTICASONE PROPIONATE 50 MCG/ACT NA SUSP
1.0000 | Freq: Every day | NASAL | Status: DC
  Filled 2023-05-10: qty 16

## 2023-05-10 MED ORDER — MAGNESIUM SULFATE 2 GM/50ML IV SOLN
2.0000 g | Freq: Once | INTRAVENOUS | Status: AC
  Administered 2023-05-10: 2 g via INTRAVENOUS
  Filled 2023-05-10: qty 50

## 2023-05-10 MED ORDER — DOBUTAMINE-DEXTROSE 4-5 MG/ML-% IV SOLN
1.0000 ug/kg/min | INTRAVENOUS | Status: DC
  Administered 2023-05-10 – 2023-05-11 (×2): 2.5 ug/kg/min via INTRAVENOUS
  Filled 2023-05-10: qty 250

## 2023-05-10 NOTE — Progress Notes (Signed)
Rounding Note    Patient Name: Angela Phillips Date of Encounter: 05/10/2023  [facility] Cardiologist: None   Subjective   Feeling short of breath but otherwise without acute complaint.  Inpatient Medications    Scheduled Meds:  arformoterol  15 mcg Nebulization BID   aspirin  81 mg Oral Daily   budesonide (PULMICORT) nebulizer solution  0.25 mg Nebulization BID   Chlorhexidine Gluconate Cloth  6 each Topical Daily   FLUoxetine  20 mg Oral BID   furosemide  40 mg Intravenous BID   heparin  5,000 Units Subcutaneous Q8H   pantoprazole  40 mg Oral Daily   sodium chloride flush  3 mL Intravenous Q12H   Continuous Infusions:  sodium chloride     sodium chloride     DOBUTamine 5 mcg/kg/min (05/10/23 0400)   PRN Meds: sodium chloride, albuterol, clonazePAM, ondansetron (ZOFRAN) IV, polyethylene glycol, senna-docusate, sodium chloride flush   Vital Signs    Vitals:   05/10/23 0300 05/10/23 0400 05/10/23 0500 05/10/23 0703  BP: 131/63 131/72    Pulse: 87 95    Resp: (!) 21 19    Temp:    98.3 F (36.8 C)  TempSrc:    Oral  SpO2: 96% 92%    Weight:   43.3 kg   Height:        Intake/Output Summary (Last 24 hours) at 05/10/2023 0819 Last data filed at 05/10/2023 0400 Gross per 24 hour  Intake 78.47 ml  Output 3750 ml  Net -3671.53 ml      05/10/2023    5:00 AM 05/09/2023    5:00 AM 05/08/2023    5:00 AM  Last 3 Weights  Weight (lbs) 95 lb 7.4 oz 110 lb 10.7 oz 101 lb 13.6 oz  Weight (kg) 43.3 kg 50.2 kg 46.2 kg      Telemetry    Sinus rhythm- Personally Reviewed  ECG    None new- Personally Reviewed  Physical Exam   GEN: No acute distress.   Neck: No JVD Cardiac: RRR, no murmurs, rubs, or gallops.  Respiratory: Wheezing throughout with rales at the bases GI: Soft, nontender, non-distended  MS: No edema; No deformity. Neuro:  Nonfocal  Psych: Normal affect   Labs    High Sensitivity Troponin:   Recent Labs  Lab 04/15/23 1627  05/06/23 1354 05/06/23 1528  TROPONINIHS 9 10 10      Chemistry Recent Labs  Lab 05/08/23 0739 05/08/23 1257 05/08/23 1303 05/08/23 1441 05/09/23 0355 05/09/23 0356 05/10/23 0337  NA 136   < > 140  --   --  135 137  K 4.9   < > 4.3  --   --  4.1 3.5  CL 105  --   --   --   --  101 94*  CO2 21*  --   --   --   --  24 31  GLUCOSE 142*  --   --   --   --  120* 94  BUN 28*  --   --   --   --  30* 19  CREATININE 1.14*  --   --  1.07*  --  1.00 0.85  CALCIUM 9.0  --   --   --   --  9.1 8.7*  MG 2.2  --   --   --   --  2.1 1.9  PROT 5.7*  --   --   --  5.1*  --  5.3*  ALBUMIN 3.0*  --   --   --  2.9*  --  2.8*  AST 486*  --   --   --  397*  --  190*  ALT 538*  --   --   --  566*  --  395*  ALKPHOS 90  --   --   --  81  --  82  BILITOT 1.2  --   --   --  0.6  --  1.1  GFRNONAA 50*  --   --  54*  --  58* >60  ANIONGAP 10  --   --   --   --  10 12   < > = values in this interval not displayed.    Lipids  Recent Labs  Lab 05/08/23 0218  CHOL 118  TRIG 67  HDL 37*  LDLCALC 68  CHOLHDL 3.2    Hematology Recent Labs  Lab 05/07/23 0754 05/08/23 1257 05/08/23 1303 05/08/23 1441 05/09/23 1114  WBC 11.3*  --   --  18.6* 13.0*  RBC 4.88  --   --  4.43 3.83*  HGB 13.0   < > 12.2 12.2 10.6*  HCT 42.7   < > 36.0 39.3 33.4*  MCV 87.5  --   --  88.7 87.2  MCH 26.6  --   --  27.5 27.7  MCHC 30.4  --   --  31.0 31.7  RDW 17.0*  --   --  17.2* 17.1*  PLT 246  --   --  166 119*   < > = values in this interval not displayed.   Thyroid  Recent Labs  Lab 05/06/23 1354  TSH 8.729*  FREET4 1.17*    BNP Recent Labs  Lab 05/08/23 0218 05/09/23 0356 05/10/23 0337  BNP 1,309.3* 1,226.0* 558.1*    DDimer  Recent Labs  Lab 05/06/23 1354  DDIMER 0.99*     Radiology    CARDIAC CATHETERIZATION  Result Date: 05/09/2023   Prox LAD to Mid LAD lesion is 50% stenosed.   1st Diag lesion is 75% stenosed.   Ost Cx to Prox Cx lesion is 50% stenosed.   1st Mrg lesion is 80%  stenosed.   Ost RCA lesion is 50% stenosed.   Prox RCA-1 lesion is 50% stenosed.   Prox RCA-2 lesion is 90% stenosed.   LV end diastolic pressure is moderately elevated.   Hemodynamic findings consistent with moderate pulmonary hypertension. 3 vessel CAD. Coronary vessels are very small in caliber. 75% diffuse first diagonal. 50% mid LAD, 80% OM1, 90% mid RCA Moderately elevated LV filling pressures. PCWP 33/30 with mean 27 mm Hg. LVEDP 27 mm Hg Moderate pulmonary HTN. PAP 54/24 mean 37 mm Hg High RA pressures with large V wave c/w severe TR. RA pressure 42/32 mean 25 mm Hg Low cardiac output. PA sat 48%, cardiac output 2.37 L/min with index 1.67. Plan: medical management. The RCA is only about 2- 2.25 mm in diameter. The larger issue appears to be cardiogenic shock and RV failure.   DG CHEST PORT 1 VIEW  Result Date: 05/08/2023 CLINICAL DATA:  Encounter for central venous line placement. EXAM: PORTABLE CHEST 1 VIEW COMPARISON:  Chest x-ray 05/06/2023. FINDINGS: Left subclavian approach central venous catheter with the tip projecting at the superior cavoatrial junction. No visible pneumothorax. Small to moderate left pleural effusion with overlying left basilar opacities. Cardiomediastinal silhouette is mildly enlarged. IMPRESSION: 1. Left subclavian approach central venous catheter with the tip  projecting at the superior cavoatrial junction. 2. Small to moderate left pleural effusion with overlying left basilar opacities. Electronically Signed   By: Feliberto Harts M.D.   On: 05/08/2023 17:59   [place] EKG SITE RITE  Result Date: 05/08/2023 If Site Rite image not attached, placement could not be confirmed due to current cardiac rhythm.   Cardiac Studies     Patient Profile     77 y.o. female with coronary artery disease, possible SVT, COPD, hypertension, hyperlipidemia presented to the hospital with chest pain received metoprolol with resultant junctional rhythm.  Assessment & Plan    1.  Sinus  node dysfunction: Occurred after metoprolol.  She has had recovering her sinus node.  Currently on dobutamine.  Kalii Chesmore attempt to wean dobutamine today.  2.  Chronic systolic heart failure: Likely due to fluid resuscitation.  Currently undergoing diuresis.  Net -2 L.  3.  Severe tricuspid regurgitation:  4.  Elevated LFTs: Occurred in the setting of cardiogenic shock.  Continue to trend.  Improving.  5.  Coronary artery disease: Small caliber vessels.  Plan to manage medically.  6.  Left pleural effusion: May need thoracentesis if does not improve  7.  Hypokalemia/hypomagnesemia: Kisa Fujii replete today      For questions or updates, please contact [facility] Please consult [email or web address] for contact info under        Signed, Marium Ragan Jorja Loa, MD  05/10/2023, 8:19 AM

## 2023-05-10 NOTE — Plan of Care (Signed)

## 2023-05-10 NOTE — Progress Notes (Signed)
Advanced Heart Failure Rounding Note   Subjective:    DBA weaned to off this am. Rhythm stable. Sitting up on side of bed. No CP or SPB. BP ok.    Objective:   Weight Range:  Vital Signs:   Temp:  [97.7 F (36.5 C)-98.4 F (36.9 C)] 98.3 F (36.8 C) (08/31 0703) Pulse Rate:  [80-95] 92 (08/31 0900) Resp:  [16-30] 16 (08/31 0900) BP: (89-151)/(56-79) 144/65 (08/31 0900) SpO2:  [84 %-100 %] 95 % (08/31 0900) Weight:  [43.3 kg] 43.3 kg (08/31 0500) Last BM Date : 05/06/23  Weight change: Filed Weights   05/08/23 0500 05/09/23 0500 05/10/23 0500  Weight: 46.2 kg 50.2 kg 43.3 kg    Intake/Output:   Intake/Output Summary (Last 24 hours) at 05/10/2023 0948 Last data filed at 05/10/2023 0800 Gross per 24 hour  Intake 71.54 ml  Output 3975 ml  Net -3903.46 ml     Physical Exam: General:  Sitting up on side of bed No resp difficulty HEENT: normal Neck: supple. JVP 7 . Carotids 2+ bilat; no bruits. No lymphadenopathy or thryomegaly appreciated. Cor: PMI nondisplaced. Regular rate & rhythm. No rubs, gallops or murmurs. Lungs: decreased throughout  Abdomen: soft, nontender, nondistended. No hepatosplenomegaly. No bruits or masses. Good bowel sounds. Extremities: no cyanosis, clubbing, rash, edema Neuro: alert & orientedx3, cranial nerves grossly intact. moves all 4 extremities w/o difficulty. Affect pleasant  Telemetry: Sinus 80-90s  Personally reviewed  Labs: Basic Metabolic Panel: Recent Labs  Lab 05/07/23 0754 05/07/23 0832 05/07/23 1550 05/08/23 0739 05/08/23 1257 05/08/23 1258 05/08/23 1302 05/08/23 1303 05/08/23 1441 05/09/23 0356 05/10/23 0337  NA  --  138 135 136   < > 139 139 140  --  135 137  K  --  5.4* 4.7 4.9   < > 4.5 4.5 4.3  --  4.1 3.5  CL  --  105 104 105  --   --   --   --   --  101 94*  CO2  --  19* 21* 21*  --   --   --   --   --  24 31  GLUCOSE  --  140* 145* 142*  --   --   --   --   --  120* 94  BUN  --  24* 25* 28*  --   --   --    --   --  30* 19  CREATININE  --  1.60* 1.31* 1.14*  --   --   --   --  1.07* 1.00 0.85  CALCIUM  --  10.2 9.4 9.0  --   --   --   --   --  9.1 8.7*  MG 1.8  --   --  2.2  --   --   --   --   --  2.1 1.9   < > = values in this interval not displayed.    Liver Function Tests: Recent Labs  Lab 05/07/23 0832 05/07/23 1550 05/08/23 0739 05/09/23 0355 05/10/23 0337  AST 221* 378* 486* 397* 190*  ALT 185* 346* 538* 566* 395*  ALKPHOS 109 94 90 81 82  BILITOT 0.9 0.8 1.2 0.6 1.1  PROT 5.7* 5.7* 5.7* 5.1* 5.3*  ALBUMIN 3.1* 3.0* 3.0* 2.9* 2.8*   No results for input(s): "LIPASE", "AMYLASE" in the last 168 hours. No results for input(s): "AMMONIA" in the last 168 hours.  CBC: Recent Labs  Lab 05/06/23  1354 05/07/23 0754 05/08/23 1257 05/08/23 1258 05/08/23 1302 05/08/23 1303 05/08/23 1441 05/09/23 1114  WBC 8.1 11.3*  --   --   --   --  18.6* 13.0*  NEUTROABS 5.2  --   --   --   --   --   --   --   HGB 12.4 13.0   < > 12.9 12.2 12.2 12.2 10.6*  HCT 39.7 42.7   < > 38.0 36.0 36.0 39.3 33.4*  MCV 90.0 87.5  --   --   --   --  88.7 87.2  PLT 321 246  --   --   --   --  166 119*   < > = values in this interval not displayed.    Cardiac Enzymes: No results for input(s): "CKTOTAL", "CKMB", "CKMBINDEX", "TROPONINI" in the last 168 hours.  BNP: BNP (last 3 results) Recent Labs    05/08/23 0218 05/09/23 0356 05/10/23 0337  BNP 1,309.3* 1,226.0* 558.1*    ProBNP (last 3 results) No results for input(s): "PROBNP" in the last 8760 hours.    Other results:  Imaging: CARDIAC CATHETERIZATION  Result Date: 05/09/2023   Prox LAD to Mid LAD lesion is 50% stenosed.   1st Diag lesion is 75% stenosed.   Ost Cx to Prox Cx lesion is 50% stenosed.   1st Mrg lesion is 80% stenosed.   Ost RCA lesion is 50% stenosed.   Prox RCA-1 lesion is 50% stenosed.   Prox RCA-2 lesion is 90% stenosed.   LV end diastolic pressure is moderately elevated.   Hemodynamic findings consistent with  moderate pulmonary hypertension. 3 vessel CAD. Coronary vessels are very small in caliber. 75% diffuse first diagonal. 50% mid LAD, 80% OM1, 90% mid RCA Moderately elevated LV filling pressures. PCWP 33/30 with mean 27 mm Hg. LVEDP 27 mm Hg Moderate pulmonary HTN. PAP 54/24 mean 37 mm Hg High RA pressures with large V wave c/w severe TR. RA pressure 42/32 mean 25 mm Hg Low cardiac output. PA sat 48%, cardiac output 2.37 L/min with index 1.67. Plan: medical management. The RCA is only about 2- 2.25 mm in diameter. The larger issue appears to be cardiogenic shock and RV failure.   DG CHEST PORT 1 VIEW  Result Date: 05/08/2023 CLINICAL DATA:  Encounter for central venous line placement. EXAM: PORTABLE CHEST 1 VIEW COMPARISON:  Chest x-ray 05/06/2023. FINDINGS: Left subclavian approach central venous catheter with the tip projecting at the superior cavoatrial junction. No visible pneumothorax. Small to moderate left pleural effusion with overlying left basilar opacities. Cardiomediastinal silhouette is mildly enlarged. IMPRESSION: 1. Left subclavian approach central venous catheter with the tip projecting at the superior cavoatrial junction. 2. Small to moderate left pleural effusion with overlying left basilar opacities. Electronically Signed   By: Feliberto Harts M.D.   On: 05/08/2023 17:59   [place] EKG SITE RITE  Result Date: 05/08/2023 If Site Rite image not attached, placement could not be confirmed due to current cardiac rhythm.    Medications:     Scheduled Medications:  arformoterol  15 mcg Nebulization BID   aspirin  81 mg Oral Daily   budesonide (PULMICORT) nebulizer solution  0.25 mg Nebulization BID   Chlorhexidine Gluconate Cloth  6 each Topical Daily   FLUoxetine  20 mg Oral BID   heparin  5,000 Units Subcutaneous Q8H   pantoprazole  40 mg Oral Daily   potassium chloride  30 mEq Oral Q1 Hr x 3  sodium chloride flush  3 mL Intravenous Q12H    Infusions:  sodium chloride     sodium  chloride     DOBUTamine 5 mcg/kg/min (05/10/23 0400)   magnesium sulfate bolus IVPB 2 g (05/10/23 0848)    PRN Medications: sodium chloride, albuterol, clonazePAM, fluticasone, ondansetron (ZOFRAN) IV, polyethylene glycol, senna-docusate, sodium chloride flush   Assessment:   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 Med management recommended. RHC with elevated R and L filling pressures, PA mean 37, w/ CI 1.67. Large V waves consistent with severe TR. - No PE on chest CT - Suspect incited by b-blocker dose for coronary CTA on top of underlying PH/cor pulmonale in setting of COPD, radiation for lung cancer and hx RA -No PE on CTA chest -CO-OX 74% this am on 5 DBA. DBA now off. Repeat co-ox  -Lactic acid peaked at 4.9 >>> down to 2.1 - has diuresed well. Stop diuretics for now    2. Bradycardia -Has not tolerated beta blockers in the past d/t history of symptomatic bradycardia/junctional rhythm -Accelerated junctional rhythm for 2 days. Dobutamine started. Now NSR 90s  -EP Following - Stable - Avoid AV nodal blockers. B-blockers now listed as an allergy   3. TR in setting of underlying PH and cor-pulmonale  -Moderate on echo -Large V waves on RHC suggesting severe TR   4. Elevated LFTs -In setting of cardiogenic shock. Improving now  -Continue to trend   5. CAD -Diffuse 3 v CAD, vessels are very small in caliber -Managed medically -Start ASA/statin  6. 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 Left pleural effusion - May need thoracentesis if does not improve with diuresis   Length of Stay: 4   Arkin Imran MD 05/10/2023, 9:48 AM  Advanced  Heart Failure Team Pager [phone] (M-F; 7a - 4p)  Please contact [facility] for night-coverage after hours (4p -7a ) and weekends on [email or web address]

## 2023-05-10 NOTE — Progress Notes (Signed)
NAME:  Angela Phillips, MRN:  [id number], DOB:  1946-01-29, LOS: 4 ADMISSION DATE:  05/06/2023, CONSULTATION DATE:  05/06/23 REFERRING MD:  Marisue Humble, CHIEF COMPLAINT:  Symptomatic bradycardia   History of Present Illness:  [name] is a 77 year-old female with a history of atypical chest pain, asthma, lung adenocarcinoma s/p radiation therapy, COPD, hyperlipidemia, hypertension, and SVT presented to the ED with chest pain, dizziness, nausea. She was seen by Cardiology on 8/21 for frequent episodes (5-6x a day) of racing heart, felt radiating to her neck and left arm with associated with chest pressure. She had one episode of syncope with a fall on her sofa, noted worsening lower extremity edema. She uses supplemental oxygen at home at night, but was using it during the day during these episodes. On 8/27, she was on her way to CT coronary angiography as recommended by Cardiology, took metoprolol on the way, and became weak, nauseated, diaphoretic, prompting presentation to the ED.   In the ED, she was noted to have elevated Lactate, TSH, free T4. She was given IV fluids, tried Trendelenburg positioning with no improvement in hypotension or bradycardia. She was transferred to the ICU for vasoactive support.    Pertinent  Medical History  Junctional bradycardia  Cystitis Asthma Atypical chest pain  RLL adenocarcinoma treated with radiation therapy  COPD Hypertension, Hyperlipidemia  SVT  Breast cancer   Significant Hospital Events: Including procedures, antibiotic start and stop dates in addition to other pertinent events   8/27 admitted w/ cc: CP, dizziness and nausea. HR 30s. Had taken 50mg  metoprolol for first time at 1130 earlier that day. Remained bradycardic at 2230 after fluid challenge  8/28 HR improved to 45-50 on dopamine   Interim History / Subjective:  Improved with diuresis. Coox did drop trying to wean dobutamine.  Objective   Blood pressure 105/89, pulse 82, temperature  98.8 F (37.1 C), temperature source Oral, resp. rate (!) 24, height 5\' 1"  (1.549 m), weight 43.3 kg, SpO2 96%. CVP:  [4 mmHg-33 mmHg] 5 mmHg      Intake/Output Summary (Last 24 hours) at 05/10/2023 1307 Last data filed at 05/10/2023 1200 Gross per 24 hour  Intake 442.25 ml  Output 3725 ml  Net -3282.75 ml   Filed Weights   05/08/23 0500 05/09/23 0500 05/10/23 0500  Weight: 46.2 kg 50.2 kg 43.3 kg   Examination: No distress More energetic this am Better breath sounds Improved edema  Shock liver improved Cr improved Effusion looks better on CXR  Patient Lines/Drains/Airways Status     Active Line/Drains/Airways     Name Placement date Placement time Site Days   Peripheral IV 05/06/23 20 G Anterior;Left Forearm 05/06/23  1413  Forearm  4   Peripheral IV 05/08/23 22 G 1.75" Anterior;Left Forearm 05/08/23  1501  Forearm  2   CVC Triple Lumen 05/08/23 Left Subclavian 05/08/23  1715  -- 2   External Urinary Catheter 05/07/23  0030  --  3             Resolved Hospital Problem list     Assessment & Plan:  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- improved Left pleural effusion- improved Congestive hepatopathy noted- improving Volume overload- improving  - Inotrope wean per AHF - No need for thora at present: consider looking with [place] tomorrow and tapping due to hx of lung adeno - brovana+pulmicort for her asthma - CPAP PRN - ICU status given inotrope titration  Best Practice (right click and "Reselect all  SmartList Selections" daily)   Diet/type: heart healthy DVT prophylaxis: prophylactic heparin  GI prophylaxis: PPI Lines: central line yes needed Foley:  N/A Code Status:  full code Last date of multidisciplinary goals of care discussion [[name]]  [name] MD PCCM [facility] See Amion for pager If no response to pager, please call [phone] until 7pm After 7pm, Please call E-link [phone]

## 2023-05-11 DIAGNOSIS — R570 Cardiogenic shock: Secondary | ICD-10-CM | POA: Diagnosis not present

## 2023-05-11 DIAGNOSIS — R001 Bradycardia, unspecified: Secondary | ICD-10-CM | POA: Diagnosis not present

## 2023-05-11 DIAGNOSIS — I5033 Acute on chronic diastolic (congestive) heart failure: Secondary | ICD-10-CM | POA: Diagnosis not present

## 2023-05-11 LAB — CBC
HCT: 35.3 % — ABNORMAL LOW (ref 36.0–46.0)
Hemoglobin: 11 g/dL — ABNORMAL LOW (ref 12.0–15.0)
MCH: 27.4 pg (ref 26.0–34.0)
MCHC: 31.2 g/dL (ref 30.0–36.0)
MCV: 88 fL (ref 80.0–100.0)
Platelets: 170 10*3/uL (ref 150–400)
RBC: 4.01 MIL/uL (ref 3.87–5.11)
RDW: 17.4 % — ABNORMAL HIGH (ref 11.5–15.5)
WBC: 11.2 10*3/uL — ABNORMAL HIGH (ref 4.0–10.5)
nRBC: 0.2 % (ref 0.0–0.2)

## 2023-05-11 LAB — BRAIN NATRIURETIC PEPTIDE: B Natriuretic Peptide: 268.5 pg/mL — ABNORMAL HIGH (ref 0.0–100.0)

## 2023-05-11 LAB — BASIC METABOLIC PANEL
Anion gap: 8 (ref 5–15)
BUN: 26 mg/dL — ABNORMAL HIGH (ref 8–23)
CO2: 32 mmol/L (ref 22–32)
Calcium: 8.6 mg/dL — ABNORMAL LOW (ref 8.9–10.3)
Chloride: 94 mmol/L — ABNORMAL LOW (ref 98–111)
Creatinine, Ser: 0.82 mg/dL (ref 0.44–1.00)
GFR, Estimated: >60 mL/min (ref >60)
Glucose, Bld: 124 mg/dL — ABNORMAL HIGH (ref 70–99)
Potassium: 4.8 mmol/L (ref 3.5–5.1)
Sodium: 134 mmol/L — ABNORMAL LOW (ref 135–145)

## 2023-05-11 LAB — COOXEMETRY PANEL
Carboxyhemoglobin: 1.2 % (ref 0.5–1.5)
Carboxyhemoglobin: 1.3 % (ref 0.5–1.5)
Methemoglobin: <0.7 % (ref 0.0–1.5)
Methemoglobin: <0.7 % (ref 0.0–1.5)
O2 Saturation: 45.8 %
O2 Saturation: 51.2 %
Total hemoglobin: 11.9 g/dL — ABNORMAL LOW (ref 12.0–16.0)
Total hemoglobin: 11.3 g/dL — ABNORMAL LOW (ref 12.0–16.0)

## 2023-05-11 LAB — MAGNESIUM: Magnesium: 2.2 mg/dL (ref 1.7–2.4)

## 2023-05-11 MED ORDER — DIGOXIN 125 MCG PO TABS
0.1250 mg | ORAL_TABLET | Freq: Every day | ORAL | Status: DC
  Administered 2023-05-11 – 2023-05-12 (×2): 0.125 mg via ORAL
  Filled 2023-05-11 (×2): qty 1

## 2023-05-11 MED ORDER — CLONAZEPAM 0.5 MG PO TABS
1.0000 mg | ORAL_TABLET | Freq: Every day | ORAL | Status: DC
  Administered 2023-05-11 – 2023-05-13 (×3): 1 mg via ORAL
  Filled 2023-05-11: qty 2
  Filled 2023-05-11 (×2): qty 1

## 2023-05-11 NOTE — Progress Notes (Signed)
NAME:  Angela Phillips, MRN:  [id number], DOB:  11/29/1945, LOS: 5 ADMISSION DATE:  05/06/2023, CONSULTATION DATE:  05/06/23 REFERRING MD:  Marisue Humble, CHIEF COMPLAINT:  Symptomatic bradycardia   History of Present Illness:  Angela Phillips is a 77 year-old female with a history of atypical chest pain, asthma, lung adenocarcinoma s/p radiation therapy, COPD, hyperlipidemia, hypertension, and SVT presented to the ED with chest pain, dizziness, nausea. She was seen by Cardiology on 8/21 for frequent episodes (5-6x a day) of racing heart, felt radiating to her neck and left arm with associated with chest pressure. She had one episode of syncope with a fall on her sofa, noted worsening lower extremity edema. She uses supplemental oxygen at home at night, but was using it during the day during these episodes. On 8/27, she was on her way to CT coronary angiography as recommended by Cardiology, took metoprolol on the way, and became weak, nauseated, diaphoretic, prompting presentation to the ED.   In the ED, she was noted to have elevated Lactate, TSH, free T4. She was given IV fluids, tried Trendelenburg positioning with no improvement in hypotension or bradycardia. She was transferred to the ICU for vasoactive support.    Pertinent  Medical History  Junctional bradycardia  Cystitis Asthma Atypical chest pain  RLL adenocarcinoma treated with radiation therapy  COPD Hypertension, Hyperlipidemia  SVT  Breast cancer   Significant Hospital Events: Including procedures, antibiotic start and stop dates in addition to other pertinent events   8/27 admitted w/ cc: CP, dizziness and nausea. HR 30s. Had taken 50mg  metoprolol for first time at 1130 earlier that day. Remained bradycardic at 2230 after fluid challenge  8/28 HR improved to 45-50 on dopamine   Interim History / Subjective:  Continues to improve. Did not sleep well Takes klonipin at home  Objective   Blood pressure 122/61, pulse 78, temperature  98.4 F (36.9 C), temperature source Oral, resp. rate (!) 23, height 5\' 1"  (1.549 m), weight 43.3 kg, SpO2 95%. CVP:  [4 mmHg-12 mmHg] 12 mmHg      Intake/Output Summary (Last 24 hours) at 05/11/2023 0730 Last data filed at 05/11/2023 0630 Gross per 24 hour  Intake 534.09 ml  Output 2325 ml  Net -1790.91 ml   Filed Weights   05/08/23 0500 05/09/23 0500 05/10/23 0500  Weight: 46.2 kg 50.2 kg 43.3 kg   Examination: No distress Lung sounds continue to improve [place] chest pretty small left effusion do not see safe window to tap Moves to command Arthritic changes of hands  Patient Lines/Drains/Airways Status     Active Line/Drains/Airways     Name Placement date Placement time Site Days   Peripheral IV 05/06/23 20 G Anterior;Left Forearm 05/06/23  1413  Forearm  4   Peripheral IV 05/08/23 22 G 1.75" Anterior;Left Forearm 05/08/23  1501  Forearm  2   CVC Triple Lumen 05/08/23 Left Subclavian 05/08/23  1715  -- 2   External Urinary Catheter 05/07/23  0030  --  3            Cr stable  Resolved Hospital Problem list     Assessment & Plan:  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  Best Practice (right click and "Reselect all SmartList Selections" daily)   Diet/type: heart healthy DVT prophylaxis: prophylactic heparin  GI prophylaxis: PPI Lines: central line yes needed Foley:  N/A Code Status:  full code Last date of multidisciplinary goals of care discussion [[name]]  [name] MD PCCM [facility] See Amion for pager If no response to pager,  please call [phone] until 7pm After 7pm, Please call E-link [phone]

## 2023-05-11 NOTE — Progress Notes (Signed)
   05/11/23 1959  BiPAP/CPAP/SIPAP  Reason BIPAP/CPAP not in use Non-compliant  BiPAP/CPAP /SiPAP Vitals  Bilateral Breath Sounds Diminished;Expiratory wheezes   Patient states she does not want to wear anything on her face. RT offered Nasal Mask pt refused.

## 2023-05-11 NOTE — Evaluation (Signed)
Physical Therapy Evaluation Patient Details Name: Angela Phillips MRN: [id number] DOB: 1945/12/22 Today's Date: 05/11/2023  History of Present Illness  Pt is 77 year old presented to [facility] on  05/06/23 for chest pain, dizziness, and nausea with HR 30's after taking metoprolol earlier in the day. 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 PMH - lung CA, asthma, atypical chest pain, copd, htn, SVT, breast CA  Clinical Impression  Pt admitted with above diagnosis and presents to PT with functional limitations due to deficits listed below (See PT problem list). Pt needs skilled PT to maximize independence and safety. Pt currently requiring significant assist to get EOB and transfer. Needed max encouragement to do that today due to fatigue and lack of sleep. Pt's husband recently had back surgery and will not be able to physically assist her. Patient will benefit from continued inpatient follow up therapy, <3 hours/day          If plan is discharge home, recommend the following: A lot of help with walking and/or transfers;A lot of help with bathing/dressing/bathroom;Assistance with cooking/housework;Help with stairs or ramp for entrance;Assist for transportation   Can travel by private vehicle   No    Equipment Recommendations None recommended by PT  Recommendations for Other Services       Functional Status Assessment Patient has had a recent decline in their functional status and demonstrates the ability to make significant improvements in function in a reasonable and predictable amount of time.     Precautions / Restrictions Precautions Precautions: Fall Restrictions Weight Bearing Restrictions: No      Mobility  Bed Mobility Overal bed mobility: Needs Assistance Bed Mobility: Supine to Sit, Sit to Supine     Supine to sit: Mod assist, HOB elevated Sit to supine: Mod assist   General bed mobility comments: Assist to bring legs off bed, elevate trunk  into sitting, and bring hips to EOB. Assist to bring legs up into bed returning to supine    Transfers Overall transfer level: Needs assistance Equipment used: 1 person hand held assist Transfers: Sit to/from Stand Sit to Stand: Max assist           General transfer comment: Used bil shelf arm support to come to stand from EOB.    Ambulation/Gait             Pre-gait activities: With bilateral shelf arm support and mod assist took several shuffling steps sideways up toward HOB.    Stairs            Wheelchair Mobility     Tilt Bed    Modified Rankin (Stroke Patients Only)       Balance Overall balance assessment: Needs assistance Sitting-balance support: Bilateral upper extremity supported, Feet supported Sitting balance-Leahy Scale: Poor Sitting balance - Comments: UE support   Standing balance support: Bilateral upper extremity supported Standing balance-Leahy Scale: Poor Standing balance comment: Static standing with mod assist.                             Pertinent Vitals/Pain Pain Assessment Pain Assessment: No/denies pain    Home Living Family/patient expects to be discharged to:: Private residence Living Arrangements: Spouse/significant other Available Help at Discharge: Family (limited due to spouse recovering from back surgery and not really able to physically assist) Type of Home: House Home Access: Stairs to enter Entrance Stairs-Rails: Right;Left;Can reach both Entrance Stairs-Number of Steps:  4   Home Layout: One level Home Equipment: Agricultural consultant (2 wheels);Rollator (4 wheels)      Prior Function Prior Level of Function : Independent/Modified Independent             Mobility Comments: Uses a rollator       Extremity/Trunk Assessment   Upper Extremity Assessment Upper Extremity Assessment: Defer to OT evaluation    Lower Extremity Assessment Lower Extremity Assessment: Generalized weakness        Communication      Cognition Arousal: Alert Behavior During Therapy: WFL for tasks assessed/performed Overall Cognitive Status: Impaired/Different from baseline Area of Impairment: Orientation                 Orientation Level: Time (Pt thought it was 9 at night not 9 in the morning)                      General Comments General comments (skin integrity, edema, etc.): VSS on 2L    Exercises     Assessment/Plan    PT Assessment Patient needs continued PT services  PT Problem List Decreased strength;Decreased activity tolerance;Decreased balance;Decreased mobility;Decreased cognition       PT Treatment Interventions DME instruction;Gait training;Stair training;Functional mobility training;Therapeutic activities;Therapeutic exercise;Balance training;Patient/family education    PT Goals (Current goals can be found in the Care Plan section)  Acute Rehab PT Goals Patient Stated Goal: get some rest PT Goal Formulation: With patient Time For Goal Achievement: 05/25/23 Potential to Achieve Goals: Good    Frequency Min 1X/week     Co-evaluation               AM-PAC PT "6 Clicks" Mobility  Outcome Measure Help needed turning from your back to your side while in a flat bed without using bedrails?: A Little Help needed moving from lying on your back to sitting on the side of a flat bed without using bedrails?: A Lot Help needed moving to and from a bed to a chair (including a wheelchair)?: Total Help needed standing up from a chair using your arms (e.g., wheelchair or bedside chair)?: A Lot Help needed to walk in hospital room?: Total Help needed climbing 3-5 steps with a railing? : Total 6 Click Score: 10    End of Session Equipment Utilized During Treatment: Oxygen;Gait belt Activity Tolerance: Patient limited by fatigue Patient left: in bed;with call bell/phone within reach;with bed alarm set Nurse Communication: Mobility status PT Visit Diagnosis:  Unsteadiness on feet (R26.81);Other abnormalities of gait and mobility (R26.89);Muscle weakness (generalized) (M62.81)    Time: 6213-0865 PT Time Calculation (min) (ACUTE ONLY): 20 min   Charges:   PT Evaluation $PT Eval Moderate Complexity: 1 Mod   PT General Charges $$ ACUTE PT VISIT: 1 Visit         [facility] PT Acute Rehabilitation Services Office [phone]   Angelina Ok [facility] 05/11/2023, 10:48 AM

## 2023-05-11 NOTE — Progress Notes (Signed)
Advanced Heart Failure Rounding Note   Subjective:    Remains on DBA 2.5. Felt SOB overnight. Now improved.   CVP 12-14 with prominent v-waves. Co-ox 46% -> 52%  Objective:   Weight Range:  Vital Signs:   Temp:  [98.3 F (36.8 C)-98.5 F (36.9 C)] 98.4 F (36.9 C) (09/01 1119) Pulse Rate:  [70-94] 74 (09/01 1300) Resp:  [15-28] 20 (09/01 1300) BP: (102-136)/(42-101) 125/55 (09/01 1300) SpO2:  [92 %-98 %] 98 % (09/01 1300) Last BM Date : 05/06/23  Weight change: Filed Weights   05/08/23 0500 05/09/23 0500 05/10/23 0500  Weight: 46.2 kg 50.2 kg 43.3 kg    Intake/Output:   Intake/Output Summary (Last 24 hours) at 05/11/2023 1351 Last data filed at 05/11/2023 0630 Gross per 24 hour  Intake 147.29 ml  Output 850 ml  Net -702.71 ml     Physical Exam: General:  sitting up in bed  resp difficulty HEENT: normal Neck: supple. JVP to ear  Carotids 2+ bilat; no bruits. No lymphadenopathy or thryomegaly appreciated. Cor: Regular rate & rhythm. No rubs, gallops or murmurs. Lungs: clear but decrteased  Abdomen: soft, nontender, nondistended. No hepatosplenomegaly. No bruits or masses. Good bowel sounds. Extremities: no cyanosis, clubbing, rash, edema Neuro: alert & orientedx3, cranial nerves grossly intact. moves all 4 extremities w/o difficulty. Affect pleasant   Telemetry: Sinus 70-80s  Personally reviewed  Labs: Basic Metabolic Panel: Recent Labs  Lab 05/07/23 0754 05/07/23 0832 05/07/23 1550 05/08/23 0739 05/08/23 1257 05/08/23 1302 05/08/23 1303 05/08/23 1441 05/09/23 0356 05/10/23 0337 05/11/23 0402  NA  --    < > 135 136   < > 139 140  --  135 137 134*  K  --    < > 4.7 4.9   < > 4.5 4.3  --  4.1 3.5 4.8  CL  --    < > 104 105  --   --   --   --  101 94* 94*  CO2  --    < > 21* 21*  --   --   --   --  24 31 32  GLUCOSE  --    < > 145* 142*  --   --   --   --  120* 94 124*  BUN  --    < > 25* 28*  --   --   --   --  30* 19 26*  CREATININE  --    < >  1.31* 1.14*  --   --   --  1.07* 1.00 0.85 0.82  CALCIUM  --    < > 9.4 9.0  --   --   --   --  9.1 8.7* 8.6*  MG 1.8  --   --  2.2  --   --   --   --  2.1 1.9 2.2   < > = values in this interval not displayed.    Liver Function Tests: Recent Labs  Lab 05/07/23 0832 05/07/23 1550 05/08/23 0739 05/09/23 0355 05/10/23 0337  AST 221* 378* 486* 397* 190*  ALT 185* 346* 538* 566* 395*  ALKPHOS 109 94 90 81 82  BILITOT 0.9 0.8 1.2 0.6 1.1  PROT 5.7* 5.7* 5.7* 5.1* 5.3*  ALBUMIN 3.1* 3.0* 3.0* 2.9* 2.8*   No results for input(s): "LIPASE", "AMYLASE" in the last 168 hours. No results for input(s): "AMMONIA" in the last 168 hours.  CBC: Recent Labs  Lab 05/06/23 1354  05/07/23 0754 05/08/23 1257 05/08/23 1302 05/08/23 1303 05/08/23 1441 05/09/23 1114 05/11/23 0737  WBC 8.1 11.3*  --   --   --  18.6* 13.0* 11.2*  NEUTROABS 5.2  --   --   --   --   --   --   --   HGB 12.4 13.0   < > 12.2 12.2 12.2 10.6* 11.0*  HCT 39.7 42.7   < > 36.0 36.0 39.3 33.4* 35.3*  MCV 90.0 87.5  --   --   --  88.7 87.2 88.0  PLT 321 246  --   --   --  166 119* 170   < > = values in this interval not displayed.    Cardiac Enzymes: No results for input(s): "CKTOTAL", "CKMB", "CKMBINDEX", "TROPONINI" in the last 168 hours.  BNP: BNP (last 3 results) Recent Labs    05/09/23 0356 05/10/23 0337 05/11/23 0402  BNP 1,226.0* 558.1* 268.5*    ProBNP (last 3 results) No results for input(s): "PROBNP" in the last 8760 hours.    Other results:  Imaging: DG CHEST PORT 1 VIEW  Result Date: 05/10/2023 CLINICAL DATA:  Shortness of breath. EXAM: PORTABLE CHEST 1 VIEW COMPARISON:  05/08/2023 FINDINGS: Similar cardiomegaly with left base collapse/consolidation and effusion. Probable trace right pleural effusion. Left subclavian central line remains in place. Telemetry leads overlie the chest. IMPRESSION: Similar left base collapse/consolidation and effusion. Electronically Signed   By: Kennith Center M.D.    On: 05/10/2023 16:24     Medications:     Scheduled Medications:  arformoterol  15 mcg Nebulization BID   aspirin  81 mg Oral Daily   budesonide (PULMICORT) nebulizer solution  0.25 mg Nebulization BID   Chlorhexidine Gluconate Cloth  6 each Topical Daily   clonazePAM  1 mg Oral QHS   FLUoxetine  20 mg Oral BID   heparin  5,000 Units Subcutaneous Q8H   pantoprazole  40 mg Oral Daily   pravastatin  80 mg Oral q1800   sodium chloride flush  3 mL Intravenous Q12H    Infusions:  sodium chloride     sodium chloride     DOBUTamine 2.5 mcg/kg/min (05/11/23 0600)    PRN Medications: sodium chloride, albuterol, clonazePAM, fluticasone, ondansetron (ZOFRAN) IV, polyethylene glycol, senna-docusate, sodium chloride flush   Assessment:   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 Med management recommended. RHC with elevated R and L filling pressures, PA mean 37, w/ CI 1.67. Large V waves consistent with severe TR. - No PE on chest CT - Suspect incited by b-blocker dose for coronary CTA on top of underlying PH/cor pulmonale in setting of COPD, radiation for lung cancer and hx RA -No PE on CTA chest -CO-OX 51% this am on 2.5 DBA. CVP high -She has end-stage RV failure/cor pulmonale. Continue DBA for now. Add digoxin. Wean DBA slowly Follow co-ox    2. Bradycardia -Has not tolerated beta blockers in the past d/t history of symptomatic bradycardia/junctional rhythm -Accelerated junctional rhythm for 2 days. Dobutamine started. Now NSR 90s  -EP Following - Stable - Avoid AV nodal blockers. B-blockers now listed as an allergy  3. 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 Elevated LFTs -In setting of cardiogenic shock. Improving now  -Continue to trend   6. CAD -Diffuse 3 v CAD, vessels are very small in caliber -Managed medically -Continue  ASA/stati   7. Left pleural effusion - May need thoracentesis if does not improve with diuresis  CRITICAL CARE Performed by: Arvilla Meres  Total critical care time: 45 minutes  Critical care time was exclusive of separately billable procedures and treating other patients.  Critical care was necessary to treat or prevent imminent or life-threatening deterioration.  Critical care was time spent personally by me (independent of midlevel providers or residents) on the following activities: development of treatment plan with patient and/or surrogate as well as nursing, discussions with consultants, evaluation of patient's response to treatment, examination of patient, obtaining history from patient or surrogate, ordering and performing treatments and interventions, ordering and review of laboratory studies, ordering and review of radiographic studies, pulse oximetry and re-evaluation of patient's condition.   Length of Stay: 5   Arvilla Meres MD 05/11/2023, 1:51 PM  Advanced Heart Failure Team Pager [phone] (M-F; 7a - 4p)  Please contact [facility] for night-coverage after hours (4p -7a ) and weekends on [email or web address]

## 2023-05-11 NOTE — Plan of Care (Signed)

## 2023-05-12 DIAGNOSIS — C3412 Malignant neoplasm of upper lobe, left bronchus or lung: Secondary | ICD-10-CM | POA: Diagnosis present

## 2023-05-12 DIAGNOSIS — J90 Pleural effusion, not elsewhere classified: Secondary | ICD-10-CM | POA: Diagnosis not present

## 2023-05-12 DIAGNOSIS — I272 Pulmonary hypertension, unspecified: Secondary | ICD-10-CM | POA: Diagnosis present

## 2023-05-12 DIAGNOSIS — I5033 Acute on chronic diastolic (congestive) heart failure: Secondary | ICD-10-CM | POA: Diagnosis not present

## 2023-05-12 DIAGNOSIS — R570 Cardiogenic shock: Secondary | ICD-10-CM | POA: Diagnosis not present

## 2023-05-12 DIAGNOSIS — K7200 Acute and subacute hepatic failure without coma: Secondary | ICD-10-CM | POA: Diagnosis not present

## 2023-05-12 DIAGNOSIS — N179 Acute kidney failure, unspecified: Secondary | ICD-10-CM | POA: Diagnosis not present

## 2023-05-12 HISTORY — DX: Pleural effusion, not elsewhere classified: J90

## 2023-05-12 HISTORY — DX: Pulmonary hypertension, unspecified: I27.20

## 2023-05-12 HISTORY — DX: Malignant neoplasm of upper lobe, left bronchus or lung: C34.12

## 2023-05-12 LAB — BASIC METABOLIC PANEL
Anion gap: 8 (ref 5–15)
BUN: 13 mg/dL (ref 8–23)
CO2: 28 mmol/L (ref 22–32)
Calcium: 8.2 mg/dL — ABNORMAL LOW (ref 8.9–10.3)
Chloride: 95 mmol/L — ABNORMAL LOW (ref 98–111)
Creatinine, Ser: 0.75 mg/dL (ref 0.44–1.00)
GFR, Estimated: >60 mL/min (ref >60)
Glucose, Bld: 93 mg/dL (ref 70–99)
Potassium: 4 mmol/L (ref 3.5–5.1)
Sodium: 131 mmol/L — ABNORMAL LOW (ref 135–145)

## 2023-05-12 LAB — CBC
HCT: 34.4 % — ABNORMAL LOW (ref 36.0–46.0)
Hemoglobin: 10.7 g/dL — ABNORMAL LOW (ref 12.0–15.0)
MCH: 27 pg (ref 26.0–34.0)
MCHC: 31.1 g/dL (ref 30.0–36.0)
MCV: 86.9 fL (ref 80.0–100.0)
Platelets: 186 10*3/uL (ref 150–400)
RBC: 3.96 MIL/uL (ref 3.87–5.11)
RDW: 17.2 % — ABNORMAL HIGH (ref 11.5–15.5)
WBC: 8.5 10*3/uL (ref 4.0–10.5)
nRBC: 0 % (ref 0.0–0.2)

## 2023-05-12 LAB — BRAIN NATRIURETIC PEPTIDE: B Natriuretic Peptide: 262.9 pg/mL — ABNORMAL HIGH (ref 0.0–100.0)

## 2023-05-12 LAB — MAGNESIUM: Magnesium: 2 mg/dL (ref 1.7–2.4)

## 2023-05-12 LAB — COOXEMETRY PANEL
Carboxyhemoglobin: 2.1 % — ABNORMAL HIGH (ref 0.5–1.5)
Methemoglobin: <0.7 % (ref 0.0–1.5)
O2 Saturation: 85.8 %
Total hemoglobin: 11.3 g/dL — ABNORMAL LOW (ref 12.0–16.0)

## 2023-05-12 MED ORDER — ACETAMINOPHEN 325 MG PO TABS
650.0000 mg | ORAL_TABLET | Freq: Four times a day (QID) | ORAL | Status: DC | PRN
  Administered 2023-05-12 – 2023-05-13 (×2): 650 mg via ORAL
  Filled 2023-05-12 (×2): qty 2

## 2023-05-12 NOTE — Plan of Care (Signed)

## 2023-05-12 NOTE — Progress Notes (Signed)
Advanced Heart Failure Rounding Note   Subjective:    Remains on DBA 2.5. Co-ox up to 85%? today   CVP down to 3 with prominent v-waves.  Denies CP or SOB   Objective:   Weight Range:  Vital Signs:   Temp:  [98.4 F (36.9 C)-98.8 F (37.1 C)] 98.8 F (37.1 C) (09/02 0400) Pulse Rate:  [66-87] 81 (09/02 0800) Resp:  [15-27] 15 (09/02 0800) BP: (100-154)/(54-85) 136/68 (09/02 0800) SpO2:  [94 %-100 %] 97 % (09/02 0843) Weight:  [44.1 kg] 44.1 kg (09/02 0500) Last BM Date : 05/06/23  Weight change: Filed Weights   05/10/23 0500 05/11/23 0701 05/12/23 0500  Weight: 43.3 kg 41.7 kg 44.1 kg    Intake/Output:   Intake/Output Summary (Last 24 hours) at 05/12/2023 1007 Last data filed at 05/12/2023 0800 Gross per 24 hour  Intake 35.54 ml  Output 350 ml  Net -314.46 ml     Physical Exam: General:  sitting up in bed  resp difficulty HEENT: normal Neck: supple. no JVD. Carotids 2+ bilat; no bruits. No lymphadenopathy or thryomegaly appreciated. Cor: PMI nondisplaced. Regular rate & rhythm. No rubs, gallops or murmurs. Lungs: clear decreased throughout Abdomen: soft, nontender, nondistended. No hepatosplenomegaly. No bruits or masses. Good bowel sounds. Extremities: no cyanosis, clubbing, rash, edema Neuro: alert & orientedx3, cranial nerves grossly intact. moves all 4 extremities w/o difficulty. Affect pleasant  Telemetry: Sinus 70-80s  Personally reviewed  Labs: Basic Metabolic Panel: Recent Labs  Lab 05/08/23 0739 05/08/23 1257 05/08/23 1303 05/08/23 1441 05/09/23 0356 05/10/23 0337 05/11/23 0402 05/12/23 0406  NA 136   < > 140  --  135 137 134* 131*  K 4.9   < > 4.3  --  4.1 3.5 4.8 4.0  CL 105  --   --   --  101 94* 94* 95*  CO2 21*  --   --   --  24 31 32 28  GLUCOSE 142*  --   --   --  120* 94 124* 93  BUN 28*  --   --   --  30* 19 26* 13  CREATININE 1.14*  --   --  1.07* 1.00 0.85 0.82 0.75  CALCIUM 9.0  --   --   --  9.1 8.7* 8.6* 8.2*  MG 2.2   --   --   --  2.1 1.9 2.2 2.0   < > = values in this interval not displayed.    Liver Function Tests: Recent Labs  Lab 05/07/23 0832 05/07/23 1550 05/08/23 0739 05/09/23 0355 05/10/23 0337  AST 221* 378* 486* 397* 190*  ALT 185* 346* 538* 566* 395*  ALKPHOS 109 94 90 81 82  BILITOT 0.9 0.8 1.2 0.6 1.1  PROT 5.7* 5.7* 5.7* 5.1* 5.3*  ALBUMIN 3.1* 3.0* 3.0* 2.9* 2.8*   No results for input(s): "LIPASE", "AMYLASE" in the last 168 hours. No results for input(s): "AMMONIA" in the last 168 hours.  CBC: Recent Labs  Lab 05/06/23 1354 05/07/23 0754 05/08/23 1257 05/08/23 1303 05/08/23 1441 05/09/23 1114 05/11/23 0737 05/12/23 0406  WBC 8.1 11.3*  --   --  18.6* 13.0* 11.2* 8.5  NEUTROABS 5.2  --   --   --   --   --   --   --   HGB 12.4 13.0   < > 12.2 12.2 10.6* 11.0* 10.7*  HCT 39.7 42.7   < > 36.0 39.3 33.4* 35.3* 34.4*  MCV 90.0  87.5  --   --  88.7 87.2 88.0 86.9  PLT 321 246  --   --  166 119* 170 186   < > = values in this interval not displayed.    Cardiac Enzymes: No results for input(s): "CKTOTAL", "CKMB", "CKMBINDEX", "TROPONINI" in the last 168 hours.  BNP: BNP (last 3 results) Recent Labs    05/10/23 0337 05/11/23 0402 05/12/23 0406  BNP 558.1* 268.5* 262.9*    ProBNP (last 3 results) No results for input(s): "PROBNP" in the last 8760 hours.    Other results:  Imaging: DG CHEST PORT 1 VIEW  Result Date: 05/10/2023 CLINICAL DATA:  Shortness of breath. EXAM: PORTABLE CHEST 1 VIEW COMPARISON:  05/08/2023 FINDINGS: Similar cardiomegaly with left base collapse/consolidation and effusion. Probable trace right pleural effusion. Left subclavian central line remains in place. Telemetry leads overlie the chest. IMPRESSION: Similar left base collapse/consolidation and effusion. Electronically Signed   By: Kennith Center M.D.   On: 05/10/2023 16:24     Medications:     Scheduled Medications:  arformoterol  15 mcg Nebulization BID   aspirin  81 mg Oral  Daily   budesonide (PULMICORT) nebulizer solution  0.25 mg Nebulization BID   Chlorhexidine Gluconate Cloth  6 each Topical Daily   clonazePAM  1 mg Oral QHS   digoxin  0.125 mg Oral Daily   FLUoxetine  20 mg Oral BID   heparin  5,000 Units Subcutaneous Q8H   pantoprazole  40 mg Oral Daily   pravastatin  80 mg Oral q1800   sodium chloride flush  3 mL Intravenous Q12H    Infusions:  sodium chloride     sodium chloride     DOBUTamine 2.5 mcg/kg/min (05/12/23 0800)    PRN Medications: sodium chloride, albuterol, clonazePAM, fluticasone, ondansetron (ZOFRAN) IV, polyethylene glycol, senna-docusate, sodium chloride flush   Assessment:   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 Med management recommended. RHC with elevated R and L filling pressures, PA mean 37, w/ CI 1.67. Large V waves consistent with severe TR. - No PE on chest CT - Suspect incited by b-blocker dose for coronary CTA on top of underlying PH/cor pulmonale in setting of COPD, radiation for lung cancer and hx RA -No PE on CTA chest -CO-OX 86% this am on 2.5 DBA. CVP down to 3 -She has end-stage RV failure/cor pulmonale. Continue DBA for now. Digoxin added today,. Wean DBA slowly Follow co-ox    2. Bradycardia -Has not tolerated beta blockers in the past d/t history of symptomatic bradycardia/junctional rhythm -Accelerated junctional rhythm for 2 days. Dobutamine started. Now NSR 90s  -EP Following - Stable - Avoid AV nodal blockers. B-blockers now listed as an allergy  3. 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 Elevated LFTs -In setting of cardiogenic shock. Improving now  -Continue to trend   6. CAD -Diffuse 3 v CAD, vessels are very small in caliber -Managed medically -Continue  ASA/stati   7. Left pleural effusion - stabl - repeat CXR tomorrow  CRITICAL CARE  Length of Stay: 6   Arvilla Meres MD 05/12/2023, 10:07 AM  Advanced Heart Failure Team Pager [phone] (M-F; 7a - 4p)  Please contact [facility] for night-coverage after hours (4p -7a ) and weekends on [email or web address]

## 2023-05-12 NOTE — Progress Notes (Signed)
NAME:  Angela Phillips, MRN:  [id number], DOB:  [birth year], LOS: 6 ADMISSION DATE:  05/06/2023, CONSULTATION DATE:  05/06/23 REFERRING MD:  Angela Phillips, CHIEF COMPLAINT:  Symptomatic bradycardia   History of Present Illness:  Angela Phillips is a 77 year-old female with a history of atypical chest pain, asthma, lung adenocarcinoma s/p radiation therapy, COPD, hyperlipidemia, hypertension, and SVT presented to the ED with chest pain, dizziness, nausea. She was seen by Cardiology on 8/21 for frequent episodes (5-6x a day) of racing heart, felt radiating to her neck and left arm with associated with chest pressure. She had one episode of syncope with a fall on her sofa, noted worsening lower extremity edema. She uses supplemental oxygen at home at night, but was using it during the day during these episodes. On 8/27, she was on her way to CT coronary angiography as recommended by Cardiology, took metoprolol on the way, and became weak, nauseated, diaphoretic, prompting presentation to the ED.   In the ED, she was noted to have elevated Lactate, TSH, free T4. She was given IV fluids, tried Trendelenburg positioning with no improvement in hypotension or bradycardia. She was transferred to the ICU for vasoactive support.    Pertinent  Medical History  Junctional bradycardia  Cystitis Asthma Atypical chest pain  RLL adenocarcinoma treated with radiation therapy  COPD Hypertension, Hyperlipidemia  SVT  Breast cancer   Significant Hospital Events: Including procedures, antibiotic start and stop dates in addition to other pertinent events   8/27 admitted w/ cc: CP, dizziness and nausea. HR 30s. Had taken 50mg  metoprolol for first time at 1130 earlier that day. Remained bradycardic at 2230 after fluid challenge  8/28 HR improved to 45-50 on dopamine  8/31 weaned off DBA  9/1 DBA 2.5 coox 51. Dig added  9/2 stable DBA with improving coox to 85   Interim History / Subjective:  Coox 85 from 51 -- DBA has  remained at 2.5 but dig was added 9/1  Slept well overnight for the first time this admission   Objective   Blood pressure 136/68, pulse 81, temperature 98.8 F (37.1 C), resp. rate 15, height 5\' 1"  (1.549 m), weight 44.1 kg, SpO2 97%. CVP:  [0 mmHg-12 mmHg] 5 mmHg      Intake/Output Summary (Last 24 hours) at 05/12/2023 0945 Last data filed at 05/12/2023 0800 Gross per 24 hour  Intake 37.16 ml  Output 350 ml  Net -312.84 ml   Filed Weights   05/10/23 0500 05/11/23 0701 05/12/23 0500  Weight: 43.3 kg 41.7 kg 44.1 kg   Examination: General: chronically ill older adult F NAD HEENT: NCAT  pink mm CV: s1s2 cap refill < 3 sec Pulm: even unlabored. On 2L [place] GI: soft ndnt  GU: purewick, yellow uring MSK: no acute joint deformity. Chronic arthritic changes. 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   -repeat CXR 9/3 for effusion -- there hasn't been a safe window thus far, but if incr or window arises, would favor thora given hx of lung cancer   -ASA  Best Practice (right click and "Reselect all SmartList Selections" daily)   Diet/type: heart healthy DVT prophylaxis: prophylactic heparin  GI prophylaxis: PPI Lines: central line yes needed Foley:  N/A Code Status:  full code Last date of multidisciplinary goals of care discussion [[name]]  CRITICAL CARE Performed by: Lanier Clam   Total critical care time: 36 minutes  Critical care time was exclusive of separately billable procedures and treating other patients. Critical care was necessary to treat or prevent imminent or life-threatening deterioration.  Critical care was time spent personally by me on the following activities: development of treatment plan with patient and/or surrogate as well as nursing, discussions with consultants, evaluation of patient's response to treatment, examination of patient, obtaining history from patient or surrogate, ordering and performing treatments and interventions, ordering and review of laboratory studies, ordering and review of radiographic studies, pulse oximetry and re-evaluation of patient's condition.   Angela Fass MSN, AGACNP-BC [facility] Pulmonary/Critical Care Medicine Amion for pager 05/12/2023, 9:45 AM

## 2023-05-12 NOTE — Evaluation (Signed)
Occupational Therapy Evaluation Patient Details Name: Angela Phillips MRN: [id number] DOB: 09/13/1945 Today's Date: 05/12/2023   History of Present Illness Pt is 77 year old presented to [facility] on  [facility]/06/23 for chest pain, dizziness, and nausea with HR 30's after taking metoprolol earlier in the day. 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 PMH - lung CA, asthma, atypical chest pain, copd, htn, SVT, breast CA   Clinical Impression   Pt reports ind at baseline with ADLs and PRN use of rollator for mobility, lives with spouse who can provide minimal assist as he has recently had back surgery. Pt currently needing set up -mod A for ADLs, min A for bed mobility, and min A for simulated pivot transfers and taking a few steps at EOB. Pt presenting with impairments listed below, will follow acutely. Recommend HHOT at d/c pending progression.       If plan is discharge home, recommend the following: A little help with walking and/or transfers;A lot of help with bathing/dressing/bathroom;Help with stairs or ramp for entrance;Assistance with cooking/housework;Assist for transportation    Functional Status Assessment  Patient has had a recent decline in their functional status and demonstrates the ability to make significant improvements in function in a reasonable and predictable amount of time.  Equipment Recommendations  Tub/shower seat    Recommendations for Other Services PT consult     Precautions / Restrictions Precautions Precautions: Fall Restrictions Weight Bearing Restrictions: No      Mobility Bed Mobility Overal bed mobility: Needs Assistance Bed Mobility: Supine to Sit, Sit to Supine     Supine to sit: Min assist Sit to supine: Min assist        Transfers Overall transfer level: Needs assistance Equipment used: 1 person hand held assist Transfers: Sit to/from Stand, Bed to chair/wheelchair/BSC Sit to Stand: Min assist Stand pivot  transfers: Min assist (simulated)         General transfer comment: able to take side steps along side of bed and simulate step pivot transfer with min A      Balance Overall balance assessment: Needs assistance Sitting-balance support: Bilateral upper extremity supported, Feet supported Sitting balance-Leahy Scale: Good     Standing balance support: Bilateral upper extremity supported Standing balance-Leahy Scale: Fair Standing balance comment: static standing with minimal UE support,                           ADL either performed or assessed with clinical judgement   ADL Overall ADL's : Needs assistance/impaired Eating/Feeding: Set up   Grooming: Minimal assistance;Standing   Upper Body Bathing: Minimal assistance   Lower Body Bathing: Moderate assistance   Upper Body Dressing : Minimal assistance   Lower Body Dressing: Moderate assistance   Toilet Transfer: Minimal assistance   Toileting- Clothing Manipulation and Hygiene: Moderate assistance       Functional mobility during ADLs: Minimal assistance       Vision   Vision Assessment?: No apparent visual deficits     Perception Perception: Not tested       Praxis Praxis: Not tested       Pertinent Vitals/Pain Pain Assessment Pain Assessment: No/denies pain     Extremity/Trunk Assessment Upper Extremity Assessment Upper Extremity Assessment: Generalized weakness   Lower Extremity Assessment Lower Extremity Assessment: Defer to PT evaluation   Cervical / Trunk Assessment Cervical / Trunk Assessment: Normal   Communication Communication Communication: No apparent difficulties  Cognition Arousal: Alert Behavior During Therapy: WFL for tasks assessed/performed Overall Cognitive Status: Within Functional Limits for tasks assessed                                       General Comments  VSS    Exercises     Shoulder Instructions      Home Living Family/patient  expects to be discharged to:: Private residence Living Arrangements: Spouse/significant other Available Help at Discharge: Family Type of Home: House Home Access: Stairs to enter Secretary/administrator of Steps: 4 Entrance Stairs-Rails: Right;Left;Can reach both Home Layout: One level     Bathroom Shower/Tub: Chief Strategy Officer: Handicapped height     Home Equipment: Agricultural consultant (2 wheels);Rollator (4 wheels)          Prior Functioning/Environment Prior Level of Function : Independent/Modified Independent             Mobility Comments: uses rollator PRN ADLs Comments: ind        OT Problem List: Decreased strength;Decreased range of motion;Decreased activity tolerance;Impaired balance (sitting and/or standing);Decreased safety awareness      OT Treatment/Interventions: Self-care/ADL training;Therapeutic exercise;Energy conservation;DME and/or AE instruction;Therapeutic activities;Patient/family education;Balance training    OT Goals(Current goals can be found in the care plan section) Acute Rehab OT Goals Patient Stated Goal: none stated OT Goal Formulation: With patient Time For Goal Achievement: 05/26/23 Potential to Achieve Goals: Good ADL Goals Pt Will Perform Upper Body Dressing: with supervision;sitting Pt Will Perform Lower Body Dressing: with min assist;sit to/from stand;sitting/lateral leans Pt Will Transfer to Toilet: with contact guard assist;ambulating;regular height toilet Pt Will Perform Tub/Shower Transfer: Tub transfer;Shower transfer;with contact guard assist;ambulating  OT Frequency: Min 1X/week    Co-evaluation              AM-PAC OT "6 Clicks" Daily Activity     Outcome Measure Help from another person eating meals?: None Help from another person taking care of personal grooming?: A Little Help from another person toileting, which includes using toliet, bedpan, or urinal?: A Lot Help from another person bathing  (including washing, rinsing, drying)?: A Lot Help from another person to put on and taking off regular upper body clothing?: A Little Help from another person to put on and taking off regular lower body clothing?: A Lot 6 Click Score: 16   End of Session Equipment Utilized During Treatment: Gait belt Nurse Communication: Mobility status  Activity Tolerance: Patient tolerated treatment well Patient left: in bed;with call bell/phone within reach;with bed alarm set  OT Visit Diagnosis: Unsteadiness on feet (R26.81);Other abnormalities of gait and mobility (R26.89);Muscle weakness (generalized) (M62.81)                Time: 1096-0454 OT Time Calculation (min): 27 min Charges:  OT General Charges $OT Visit: 1 Visit OT Evaluation $OT Eval Moderate Complexity: 1 Mod OT Treatments $Self Care/Home Management : 8-22 mins  Carver Fila, OTD, OTR/L SecureChat Preferred Acute Rehab [phone]   Carver Fila Koonce 05/12/2023, 4:31 PM

## 2023-05-13 ENCOUNTER — Other Ambulatory Visit (HOSPITAL_COMMUNITY): Payer: Medicare HMO

## 2023-05-13 ENCOUNTER — Inpatient Hospital Stay (HOSPITAL_COMMUNITY): Payer: Medicare HMO

## 2023-05-13 ENCOUNTER — Other Ambulatory Visit: Payer: Self-pay

## 2023-05-13 ENCOUNTER — Encounter (HOSPITAL_COMMUNITY): Payer: Self-pay | Admitting: Pulmonary Disease

## 2023-05-13 ENCOUNTER — Encounter (HOSPITAL_COMMUNITY): Payer: Self-pay

## 2023-05-13 DIAGNOSIS — R001 Bradycardia, unspecified: Secondary | ICD-10-CM | POA: Diagnosis not present

## 2023-05-13 DIAGNOSIS — D649 Anemia, unspecified: Secondary | ICD-10-CM

## 2023-05-13 DIAGNOSIS — J9611 Chronic respiratory failure with hypoxia: Secondary | ICD-10-CM

## 2023-05-13 DIAGNOSIS — R570 Cardiogenic shock: Secondary | ICD-10-CM | POA: Diagnosis not present

## 2023-05-13 DIAGNOSIS — I272 Pulmonary hypertension, unspecified: Secondary | ICD-10-CM

## 2023-05-13 HISTORY — DX: Anemia, unspecified: D64.9

## 2023-05-13 HISTORY — DX: Chronic respiratory failure with hypoxia: J96.11

## 2023-05-13 LAB — BASIC METABOLIC PANEL
Anion gap: 7 (ref 5–15)
BUN: 13 mg/dL (ref 8–23)
CO2: 31 mmol/L (ref 22–32)
Calcium: 8.6 mg/dL — ABNORMAL LOW (ref 8.9–10.3)
Chloride: 97 mmol/L — ABNORMAL LOW (ref 98–111)
Creatinine, Ser: 0.95 mg/dL (ref 0.44–1.00)
GFR, Estimated: >60 mL/min (ref >60)
Glucose, Bld: 107 mg/dL — ABNORMAL HIGH (ref 70–99)
Potassium: 4.4 mmol/L (ref 3.5–5.1)
Sodium: 135 mmol/L (ref 135–145)

## 2023-05-13 LAB — COOXEMETRY PANEL
Carboxyhemoglobin: 0.8 % (ref 0.5–1.5)
Carboxyhemoglobin: 2.3 % — ABNORMAL HIGH (ref 0.5–1.5)
Methemoglobin: 3.5 % — ABNORMAL HIGH (ref 0.0–1.5)
Methemoglobin: <0.7 % (ref 0.0–1.5)
O2 Saturation: 67.1 %
O2 Saturation: 71.5 %
Total hemoglobin: 11.4 g/dL — ABNORMAL LOW (ref 12.0–16.0)
Total hemoglobin: 11.8 g/dL — ABNORMAL LOW (ref 12.0–16.0)

## 2023-05-13 LAB — CBC
HCT: 34 % — ABNORMAL LOW (ref 36.0–46.0)
Hemoglobin: 10.5 g/dL — ABNORMAL LOW (ref 12.0–15.0)
MCH: 26.9 pg (ref 26.0–34.0)
MCHC: 30.9 g/dL (ref 30.0–36.0)
MCV: 87 fL (ref 80.0–100.0)
Platelets: 216 10*3/uL (ref 150–400)
RBC: 3.91 MIL/uL (ref 3.87–5.11)
RDW: 17.2 % — ABNORMAL HIGH (ref 11.5–15.5)
WBC: 9.6 10*3/uL (ref 4.0–10.5)
nRBC: 0 % (ref 0.0–0.2)

## 2023-05-13 LAB — BRAIN NATRIURETIC PEPTIDE: B Natriuretic Peptide: 126.9 pg/mL — ABNORMAL HIGH (ref 0.0–100.0)

## 2023-05-13 LAB — MAGNESIUM: Magnesium: 2.2 mg/dL (ref 1.7–2.4)

## 2023-05-13 LAB — LIPOPROTEIN A (LPA): Lipoprotein (a): <8.4 nmol/L (ref <75.0)

## 2023-05-13 MED ORDER — DIGOXIN 125 MCG PO TABS
0.0625 mg | ORAL_TABLET | Freq: Every day | ORAL | Status: DC
  Administered 2023-05-13: 0.0625 mg via ORAL
  Filled 2023-05-13: qty 1

## 2023-05-13 MED ORDER — SENNOSIDES-DOCUSATE SODIUM 8.6-50 MG PO TABS
1.0000 | ORAL_TABLET | Freq: Two times a day (BID) | ORAL | Status: DC
  Administered 2023-05-13 – 2023-05-14 (×3): 1 via ORAL
  Filled 2023-05-13 (×3): qty 1

## 2023-05-13 MED ORDER — POLYETHYLENE GLYCOL 3350 17 G PO PACK
17.0000 g | PACK | Freq: Every day | ORAL | Status: DC
  Administered 2023-05-13: 17 g via ORAL
  Filled 2023-05-13 (×2): qty 1

## 2023-05-13 NOTE — Plan of Care (Signed)

## 2023-05-13 NOTE — Plan of Care (Signed)

## 2023-05-13 NOTE — TOC Progression Note (Signed)
Transition of Care [facility]) - Progression Note    Patient Details  Name: Angela Phillips MRN: [id number] Date of Birth: [birth year]  Transition of Care [facility]) CM/SW Contact  Nicanor Bake Phone Number: [phone] 05/13/2023, 2:13 PM  Clinical Narrative:  HF CSW met with pt at bedside. Pt was sitting up in chair in a dark room. Pt stated that she is doing well and at this time she does not need anything. CSW explained that a follow up hospital visit will be scheduled closer to dc. Pt agreed. TOC will continue following.     Expected Discharge Plan: Home w Home Health Services Barriers to Discharge: Continued Medical Work up  Expected Discharge Plan and Services   Discharge Planning Services: CM Consult Post Acute Care Choice: Home Health Living arrangements for the past 2 months: Single Family Home                                       Social Determinants of Health (SDOH) Interventions SDOH Screenings   Food Insecurity: No Food Insecurity (05/07/2023)  Housing: Low Risk  (05/07/2023)  Transportation Needs: No Transportation Needs (05/07/2023)  Utilities: Not At Risk (05/07/2023)  Alcohol Screen: Low Risk  (08/20/2022)  Depression (PHQ2-9): Low Risk  (08/20/2022)  Financial Resource Strain: Low Risk  (08/20/2022)  Physical Activity: Inactive (08/20/2022)  Social Connections: Moderately Isolated (08/20/2022)  Stress: Stress Concern Present (08/20/2022)  Tobacco Use: Medium Risk (04/30/2023)    Readmission Risk Interventions     No data to display

## 2023-05-13 NOTE — Progress Notes (Signed)
Physical Therapy Treatment Patient Details Name: Angela Phillips MRN: [id number] DOB: 04/19/1946 Today's Date: 05/13/2023   History of Present Illness Pt is 77 year old presented to [facility] on  05/06/23 for chest pain, dizziness, and nausea with HR 30's after taking metoprolol earlier in the day. 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 PMH - lung CA, asthma, atypical chest pain, copd, htn, SVT, breast CA    PT Comments  Pt progressing well towards all goals. Pt functioning at [facility] and was able to amb 250' today with rollator with minA for the first time today. Acute PT to follow and continue to assess d/c recommendations. Pt may be able to transition home with spouse if she continues to improve activity tolerance and requires less physical assist.     If plan is discharge home, recommend the following: Assistance with cooking/housework;Help with stairs or ramp for entrance;Assist for transportation;A little help with walking and/or transfers;A little help with bathing/dressing/bathroom   Can travel by private vehicle     No  Equipment Recommendations  None recommended by PT    Recommendations for Other Services       Precautions / Restrictions Precautions Precautions: Fall Restrictions Weight Bearing Restrictions: No     Mobility  Bed Mobility Overal bed mobility: Needs Assistance Bed Mobility: Supine to Sit     Supine to sit: Min assist     General bed mobility comments: pt able to bring LEs off EOB, minA for trunk elevation with HOB slightly elevated, pt able to scoot to EOB to get feet on the floor    Transfers Overall transfer level: Needs assistance Equipment used: Rollator (4 wheels) Transfers: Sit to/from Stand Sit to Stand: Min assist           General transfer comment: verbal cues to push up from bed not pull up on rollator, verbal cues to reach back for arm rests when sitting    Ambulation/Gait Ambulation/Gait assistance: Min  assist Gait Distance (Feet): 250 Feet Assistive device: Rollator (4 wheels) Gait Pattern/deviations: Step-through pattern, Decreased stride length Gait velocity: dec Gait velocity interpretation: <1.31 ft/sec, indicative of household ambulator   General Gait Details: pt with good walker management, decreased cadence initially but improved with distance, 1 standing rest break, SpO2 >94% on 3LO2 via [place], RN assisted with line management   Stairs             Wheelchair Mobility     Tilt Bed    Modified Rankin (Stroke Patients Only)       Balance Overall balance assessment: Needs assistance Sitting-balance support: Bilateral upper extremity supported, Feet supported Sitting balance-Leahy Scale: Good Sitting balance - Comments: UE support   Standing balance support: Bilateral upper extremity supported Standing balance-Leahy Scale: Fair Standing balance comment: static standing with minimal UE support,                            Cognition Arousal: Alert Behavior During Therapy: WFL for tasks assessed/performed Overall Cognitive Status: Within Functional Limits for tasks assessed                                 General Comments: pt with improved spirits and eager to mobilize        Exercises      General Comments General comments (skin integrity, edema, etc.): VSS  Pertinent Vitals/Pain Pain Assessment Pain Assessment: No/denies pain (with exception of L foot from where she dropped a can of corn on it several weeks ago, no bruising or swelling noted)    Home Living                          Prior Function            PT Goals (current goals can now be found in the care plan section) Acute Rehab PT Goals PT Goal Formulation: With patient Time For Goal Achievement: 05/25/23 Potential to Achieve Goals: Good Progress towards PT goals: Progressing toward goals    Frequency    Min 1X/week      PT Plan       Co-evaluation              AM-PAC PT "6 Clicks" Mobility   Outcome Measure  Help needed turning from your back to your side while in a flat bed without using bedrails?: A Little Help needed moving from lying on your back to sitting on the side of a flat bed without using bedrails?: A Little Help needed moving to and from a bed to a chair (including a wheelchair)?: A Little Help needed standing up from a chair using your arms (e.g., wheelchair or bedside chair)?: A Little Help needed to walk in hospital room?: A Little Help needed climbing 3-5 steps with a railing? : A Lot 6 Click Score: 17    End of Session Equipment Utilized During Treatment: Oxygen;Gait belt Activity Tolerance: Patient limited by fatigue Patient left: with call bell/phone within reach;in chair;with chair alarm set;with nursing/sitter in room Nurse Communication: Mobility status PT Visit Diagnosis: Unsteadiness on feet (R26.81);Other abnormalities of gait and mobility (R26.89);Muscle weakness (generalized) (M62.81)     Time: 1137-1206 PT Time Calculation (min) (ACUTE ONLY): 29 min  Charges:    $Gait Training: 8-22 mins $Therapeutic Activity: 8-22 mins PT General Charges $$ ACUTE PT VISIT: 1 Visit                     Lewis Shock, PT, DPT Acute Rehabilitation Services Secure chat preferred Office #: [phone]    Angela Phillips 05/13/2023, 1:21 PM

## 2023-05-13 NOTE — Progress Notes (Addendum)
Rounding Note    Patient Name: Angela Phillips Date of Encounter: 05/13/2023  [facility] Cardiologist: Dr. Tomie China  Subjective   Feels well, would like to go home, denies CP or SOB, reports OOB over the weekend  Inpatient Medications    Scheduled Meds:  arformoterol  15 mcg Nebulization BID   aspirin  81 mg Oral Daily   budesonide (PULMICORT) nebulizer solution  0.25 mg Nebulization BID   Chlorhexidine Gluconate Cloth  6 each Topical Daily   clonazePAM  1 mg Oral QHS   digoxin  0.125 mg Oral Daily   FLUoxetine  20 mg Oral BID   heparin  5,000 Units Subcutaneous Q8H   pantoprazole  40 mg Oral Daily   polyethylene glycol  17 g Oral Daily   pravastatin  80 mg Oral q1800   senna-docusate  1 tablet Oral BID   sodium chloride flush  3 mL Intravenous Q12H   Continuous Infusions:  sodium chloride     sodium chloride     DOBUTamine 2.5 mcg/kg/min (05/13/23 0800)   PRN Meds: sodium chloride, acetaminophen, albuterol, clonazePAM, fluticasone, ondansetron (ZOFRAN) IV, sodium chloride flush   Vital Signs    Vitals:   05/13/23 0600 05/13/23 0700 05/13/23 0730 05/13/23 0800  BP: 118/60  (!) 150/89 138/68  Pulse: 75 75 78 74  Resp: (!) 21 19 18  (!) 21  Temp:    98.1 F (36.7 C)  TempSrc:    Oral  SpO2: 98% 98% 96% 97%  Weight:      Height:        Intake/Output Summary (Last 24 hours) at 05/13/2023 0834 Last data filed at 05/13/2023 0800 Gross per 24 hour  Intake 279.06 ml  Output 1500 ml  Net -1220.94 ml      05/13/2023    5:00 AM 05/12/2023    5:00 AM 05/11/2023    7:01 AM  Last 3 Weights  Weight (lbs) 97 lb 3.6 oz 97 lb 3.6 oz 91 lb 14.9 oz  Weight (kg) 44.1 kg 44.1 kg 41.7 kg      Telemetry    SR 70's  - Personally Reviewed  ECG    No new EKGs - Personally Reviewed  Physical Exam   GEN: No acute distress.   Neck: No JVD Cardiac: RRR, no murmurs, rubs, or gallops.  Respiratory: reduced at the bases. GI: Soft, nontender, non-distended  MS: No  edema; No deformity. Neuro:  Nonfocal  Psych: Normal affect   Labs    High Sensitivity Troponin:   Recent Labs  Lab 04/15/23 1627 05/06/23 1354 05/06/23 1528  TROPONINIHS 9 10 10      Chemistry Recent Labs  Lab 05/08/23 0739 05/08/23 1257 05/09/23 0355 05/09/23 0356 05/10/23 0337 05/11/23 0402 05/12/23 0406 05/13/23 0454  NA 136   < >  --    < > 137 134* 131* 135  K 4.9   < >  --    < > 3.5 4.8 4.0 4.4  CL 105  --   --    < > 94* 94* 95* 97*  CO2 21*  --   --    < > 31 32 28 31  GLUCOSE 142*  --   --    < > 94 124* 93 107*  BUN 28*  --   --    < > 19 26* 13 13  CREATININE 1.14*   < >  --    < > 0.85 0.82 0.75 0.95  CALCIUM 9.0  --   --    < > 8.7* 8.6* 8.2* 8.6*  MG 2.2  --   --    < > 1.9 2.2 2.0 2.2  PROT 5.7*  --  5.1*  --  5.3*  --   --   --   ALBUMIN 3.0*  --  2.9*  --  2.8*  --   --   --   AST 486*  --  397*  --  190*  --   --   --   ALT 538*  --  566*  --  395*  --   --   --   ALKPHOS 90  --  81  --  82  --   --   --   BILITOT 1.2  --  0.6  --  1.1  --   --   --   GFRNONAA 50*   < >  --    < > >60 >60 >60 >60  ANIONGAP 10  --   --    < > 12 8 8 7    < > = values in this interval not displayed.    Lipids  Recent Labs  Lab 05/08/23 0218  CHOL 118  TRIG 67  HDL 37*  LDLCALC 68  CHOLHDL 3.2    Hematology Recent Labs  Lab 05/11/23 0737 05/12/23 0406 05/13/23 0453  WBC 11.2* 8.5 9.6  RBC 4.01 3.96 3.91  HGB 11.0* 10.7* 10.5*  HCT 35.3* 34.4* 34.0*  MCV 88.0 86.9 87.0  MCH 27.4 27.0 26.9  MCHC 31.2 31.1 30.9  RDW 17.4* 17.2* 17.2*  PLT 170 186 216   Thyroid  Recent Labs  Lab 05/06/23 1354  TSH 8.729*  FREET4 1.17*    BNP Recent Labs  Lab 05/11/23 0402 05/12/23 0406 05/13/23 0454  BNP 268.5* 262.9* 126.9*    DDimer  Recent Labs  Lab 05/06/23 1354  DDIMER 0.99*     Radiology    DG CHEST PORT 1 VIEW  Result Date: 05/13/2023 CLINICAL DATA:  Pleural effusion EXAM: PORTABLE CHEST 1 VIEW COMPARISON:  CXR 05/10/23 FINDINGS: Small  left-sided pleural effusion, unchanged from prior exam. No pneumothorax. Possible new right infrahilar airspace opacity, favored to represent atelectasis. No radiographically apparent displaced rib fractures. Visualized upper abdomen is unremarkable. Left-sided central venous catheter with unchanged positioning. Unchanged cardiac and mediastinal contours. Surgical clips in the right upper quadrant IMPRESSION: 1. Small left-sided pleural effusion, unchanged from prior exam. 2. Possible new right infrahilar airspace opacity, favored to represent atelectasis. Electronically Signed   By: Lorenza Cambridge M.D.   On: 05/13/2023 07:52    Cardiac Studies   -Echo 06/24: EF 60-65%, RV severely enlarged with reportedly normal function, RA severely dilated moderate MR -Echo this admit: EF 55-60%, RV function okay?, RV moderately enlarged, moderate TR -LHC with diffuse 3 v CAD. Med management recommended. RHC with elevated R and L filling pressures, PA mean 37, w/ CI 1.67. Large V waves consistent with severe TR.  Patient Profile     77 y.o. 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 Ladona Ridgel Would reduce dose to every other day on/about Friday this week Follow HR/rhythm closely    For questions or updates, please contact [facility] Please consult [email or web address] for contact info under        Signed, Sheilah Pigeon, PA-C  05/13/2023, 8:34 AM    EP Attending  Patient seen and examined. Agree with above. The patient continues slow improvement. She is maintaining NSR  and her rates are in the 70's. She has an improvement in her co-ox despite weaning of dobutamine. At this point, no indication for PPM, and would progress activity and wean off the dobutamine. Call us if she has more brady. We will sign off.  Sharlot Gowda Jazzmyne Rasnick,MD

## 2023-05-13 NOTE — Progress Notes (Addendum)
Advanced Heart Failure Rounding Note   Subjective:    Remains on DBA 2.5. Co-ox 67% today. CVP 2.  Feels better this morning. States breathing is much better. Stable on 2L [place].  Objective:   Weight Range:  Vital Signs:   Temp:  [97.6 F (36.4 C)-99.7 F (37.6 C)] 98.1 F (36.7 C) (09/03 0800) Pulse Rate:  [71-86] 74 (09/03 0800) Resp:  [18-26] 21 (09/03 0800) BP: (104-166)/(49-89) 138/68 (09/03 0800) SpO2:  [91 %-100 %] 95 % (09/03 0830) Weight:  [44.1 kg] 44.1 kg (09/03 0500) Last BM Date : 05/06/23  Weight change: Filed Weights   05/11/23 0701 05/12/23 0500 05/13/23 0500  Weight: 41.7 kg 44.1 kg 44.1 kg    Intake/Output:   Intake/Output Summary (Last 24 hours) at 05/13/2023 0849 Last data filed at 05/13/2023 0800 Gross per 24 hour  Intake 279.06 ml  Output 1500 ml  Net -1220.94 ml     Physical Exam: General:  well appearing.  No respiratory difficulty HEENT: normal Neck: supple. JVD ~5 cm. Carotids 2+ bilat; no bruits. No lymphadenopathy or thyromegaly appreciated. LSCCVC Cor: PMI nondisplaced. Regular rate & rhythm. No rubs, gallops or murmurs. Lungs: clear, diminished bases Abdomen: soft, nontender, nondistended. No hepatosplenomegaly. No bruits or masses. Good bowel sounds. Extremities: no cyanosis, clubbing, rash, edema  Neuro: alert & oriented x 3, cranial nerves grossly intact. moves all 4 extremities w/o difficulty. Affect pleasant.   Telemetry: Sinus 70s  Personally reviewed  Labs: Basic Metabolic Panel: Recent Labs  Lab 05/09/23 0356 05/10/23 0337 05/11/23 0402 05/12/23 0406 05/13/23 0454  NA 135 137 134* 131* 135  K 4.1 3.5 4.8 4.0 4.4  CL 101 94* 94* 95* 97*  CO2 24 31 32 28 31  GLUCOSE 120* 94 124* 93 107*  BUN 30* 19 26* 13 13  CREATININE 1.00 0.85 0.82 0.75 0.95  CALCIUM 9.1 8.7* 8.6* 8.2* 8.6*  MG 2.1 1.9 2.2 2.0 2.2    Liver Function Tests: Recent Labs  Lab 05/07/23 0832 05/07/23 1550 05/08/23 0739 05/09/23 0355  05/10/23 0337  AST 221* 378* 486* 397* 190*  ALT 185* 346* 538* 566* 395*  ALKPHOS 109 94 90 81 82  BILITOT 0.9 0.8 1.2 0.6 1.1  PROT 5.7* 5.7* 5.7* 5.1* 5.3*  ALBUMIN 3.1* 3.0* 3.0* 2.9* 2.8*   No results for input(s): "LIPASE", "AMYLASE" in the last 168 hours. No results for input(s): "AMMONIA" in the last 168 hours.  CBC: Recent Labs  Lab 05/06/23 1354 05/07/23 0754 05/08/23 1441 05/09/23 1114 05/11/23 0737 05/12/23 0406 05/13/23 0453  WBC 8.1   < > 18.6* 13.0* 11.2* 8.5 9.6  NEUTROABS 5.2  --   --   --   --   --   --   HGB 12.4   < > 12.2 10.6* 11.0* 10.7* 10.5*  HCT 39.7   < > 39.3 33.4* 35.3* 34.4* 34.0*  MCV 90.0   < > 88.7 87.2 88.0 86.9 87.0  PLT 321   < > 166 119* 170 186 216   < > = values in this interval not displayed.    Cardiac Enzymes: No results for input(s): "CKTOTAL", "CKMB", "CKMBINDEX", "TROPONINI" in the last 168 hours.  BNP: BNP (last 3 results) Recent Labs    05/11/23 0402 05/12/23 0406 05/13/23 0454  BNP 268.5* 262.9* 126.9*    ProBNP (last 3 results) No results for input(s): "PROBNP" in the last 8760 hours.    Other results:  Imaging: DG CHEST PORT  1 VIEW  Result Date: 05/13/2023 CLINICAL DATA:  Pleural effusion EXAM: PORTABLE CHEST 1 VIEW COMPARISON:  CXR 05/10/23 FINDINGS: Small left-sided pleural effusion, unchanged from prior exam. No pneumothorax. Possible new right infrahilar airspace opacity, favored to represent atelectasis. No radiographically apparent displaced rib fractures. Visualized upper abdomen is unremarkable. Left-sided central venous catheter with unchanged positioning. Unchanged cardiac and mediastinal contours. Surgical clips in the right upper quadrant IMPRESSION: 1. Small left-sided pleural effusion, unchanged from prior exam. 2. Possible new right infrahilar airspace opacity, favored to represent atelectasis. Electronically Signed   By: Lorenza Cambridge M.D.   On: 05/13/2023 07:52     Medications:     Scheduled  Medications:  arformoterol  15 mcg Nebulization BID   aspirin  81 mg Oral Daily   budesonide (PULMICORT) nebulizer solution  0.25 mg Nebulization BID   Chlorhexidine Gluconate Cloth  6 each Topical Daily   clonazePAM  1 mg Oral QHS   digoxin  0.125 mg Oral Daily   FLUoxetine  20 mg Oral BID   heparin  5,000 Units Subcutaneous Q8H   pantoprazole  40 mg Oral Daily   polyethylene glycol  17 g Oral Daily   pravastatin  80 mg Oral q1800   senna-docusate  1 tablet Oral BID   sodium chloride flush  3 mL Intravenous Q12H    Infusions:  sodium chloride     sodium chloride     DOBUTamine 2.5 mcg/kg/min (05/13/23 0800)    PRN Medications: sodium chloride, acetaminophen, albuterol, clonazePAM, fluticasone, ondansetron (ZOFRAN) IV, sodium chloride flush   Assessment:  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 Med management recommended. RHC with elevated R and L filling pressures, PA mean 37, w/ CI 1.67. Large V waves consistent with severe TR. - No PE on chest CT - Suspect incited by b-blocker dose for coronary CTA on top of underlying PH/cor pulmonale in setting of COPD, radiation for lung cancer and hx RA -No PE on CTA chest - CO-OX 67% this am on 2.5 DBA. CVP 2 - She has end-stage RV failure/cor pulmonale. - Decrease Digoxin 0.125>0.0625. EP worried about conduction issues.  - Wean DBA to 1 today. Follow co-ox. May be able to turn off later today.    2. Bradycardia - Has not tolerated beta blockers in the past d/t history of symptomatic bradycardia/junctional rhythm - Accelerated junctional rhythm for 2 days. Dobutamine started. Now NSR 70s  - EP Following - Stable - Avoid AV nodal blockers. B-blockers now listed as an allergy  3. 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 Elevated LFTs -In setting of cardiogenic shock. Improving now  -CMET tomorrow   6. CAD -Diffuse 3 v CAD, vessels are very small in caliber -Managed medically -Continue ASA/statin   7. Left pleural effusion - stable, unchanged on morning CXR - breathing much improved this morning  Stable for transfer to [facility]   Length of Stay: 7  Alma L Diaz AGACNP-BC 05/13/2023, 8:49 AM  Advanced Heart Failure Team Pager [phone] (M-F; 7a -  4p)  Please contact [facility] for night-coverage after hours (4p -7a ) and weekends on [email or web address]   Patient seen and examined with the above-signed Advanced Practice Provider and/or Housestaff. I personally reviewed laboratory data, imaging studies and relevant notes. I independently examined the patient and formulated the important aspects of the plan. I have edited the note to reflect any of my changes or salient points. I have personally discussed the plan with the patient and/or family.  Feeling better today. No CP or SOB. Co-ox improved on DBA 2.5 CVP 2. Rhythm stable  General:  Sitting up in bed  No resp difficulty HEENT: normal Neck: supple. no JVD. Carotids 2+ bilat; no bruits. No lymphadenopathy or thryomegaly appreciated. Cor:  Regular rate & rhythm. No rubs, gallops or murmurs. Lungs: clear decreased at bases  Abdomen: soft, nontender, nondistended. No hepatosplenomegaly. No bruits or masses. Good bowel sounds. Extremities: no cyanosis, clubbing, rash, edema Neuro: alert & orientedx3, cranial nerves grossly intact. moves all 4 extremities w/o difficulty. Affect pleasant  Much improved. Wean DBA throughout the day. Follow Co-ox. Left pleural effusion too small to tap on u/s.   Arvilla Meres, MD  1:30  PM

## 2023-05-13 NOTE — Progress Notes (Signed)
Patient has refused Bipap multiple nights in a row. Pt has no hx of sleep apnea. VS have been stable throughout. Bipap order has been D/C

## 2023-05-13 NOTE — Progress Notes (Signed)
NAME:  KAREEM DIDONATO, MRN:  [id number], DOB:  1946/03/01, LOS: 7 ADMISSION DATE:  05/06/2023, CONSULTATION DATE:  05/06/23 REFERRING MD:  Marisue Humble, CHIEF COMPLAINT:  Symptomatic bradycardia   History of Present Illness:  Ms. Beil is a 77 year-old female with a history of atypical chest pain, asthma, lung adenocarcinoma s/p radiation therapy, COPD, hyperlipidemia, hypertension, and SVT presented to the ED with chest pain, dizziness, nausea. She was seen by Cardiology on 8/21 for frequent episodes (5-6x a day) of racing heart, felt radiating to her neck and left arm with associated with chest pressure. She had one episode of syncope with a fall on her sofa, noted worsening lower extremity edema. She uses supplemental oxygen at home at night, but was using it during the day during these episodes. On 8/27, she was on her way to CT coronary angiography as recommended by Cardiology, took metoprolol on the way, and became weak, nauseated, diaphoretic, prompting presentation to the ED.   In the ED, she was noted to have elevated Lactate, TSH, free T4. She was given IV fluids, tried Trendelenburg positioning with no improvement in hypotension or bradycardia. She was transferred to the ICU for vasoactive support.    Pertinent  Medical History  Junctional bradycardia  Cystitis Asthma Atypical chest pain  RLL adenocarcinoma treated with radiation therapy  COPD Hypertension, Hyperlipidemia  SVT  Breast cancer   Significant Hospital Events: Including procedures, antibiotic start and stop dates in addition to other pertinent events   8/27 admitted w/ cc: CP, dizziness and nausea. HR 30s. Had taken 50mg  metoprolol for first time at 1130 earlier that day. Remained bradycardic at 2230 after fluid challenge  8/28 HR improved to 45-50 on dopamine  8/31 weaned off DBA  9/1 DBA 2.5 coox 51. Dig added  9/2 stable DBA with improving coox to 85   Interim History / Subjective:  Coox 67 from 85 BNP 126 from  262 UOP 1.5 L last 24 hours On 2.5 dobutamine CXR this am w/ relatively unchanged small L pleural effusion vs. Atelectasis. Yesterday bedside US showing minimal pleural effusion.  Objective   Blood pressure (!) 150/89, pulse 78, temperature 98.3 F (36.8 C), temperature source Oral, resp. rate 18, height 5\' 1"  (1.549 m), weight 44.1 kg, SpO2 96%. CVP:  [0 mmHg-31 mmHg] 4 mmHg      Intake/Output Summary (Last 24 hours) at 05/13/2023 0747 Last data filed at 05/13/2023 0700 Gross per 24 hour  Intake 279.06 ml  Output 1500 ml  Net -1220.94 ml   Filed Weights   05/11/23 0701 05/12/23 0500 05/13/23 0500  Weight: 41.7 kg 44.1 kg 44.1 kg   Examination: General:  NAD HEENT: MM pink/moist; [place] in place Neuro: Aox3; MAE CV: s1s2, RRR, no m/r/g PULM:  dim clear BS bilaterally; [place] 2L GI: soft, bsx4 active  Extremities: warm/dry, no edema  Skin: no rashes or lesions appreciated   Resolved Hospital Problem list     Assessment & Plan:   Sinus node dysfunction  HFpEF with RV failure, cardiogenic shock  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  CAD  Volume overload  -coox improved after adding dig 9/1  P: -coox 67 from 85; UOP 1.5 L last 24 hours -cont to trend coox and cvp -HF following; appreciate recs -diuresis per HF -cont dig and dobutamine -cont asa and statin  L pleural effusion Hx LUL lung Ca sp radiation COPD: on 2L [place] at home Plan: -cont diuresis for volume removal -trend CXR; minimal pleural effusion  noted on bedside US yesterday so no window for thoracentesis -pulm toiletry -PT/OT -cont pulmicort and brovana; prn albuterol -cont [place] and wean for sats 92%  Elevated LFTS -- congestive hepatopathy Plan: -improved -trend cmp  Hx of anxiety Plan: -cont klonopin and prozac  Osteoporosis, T8 compression fx Plan: -supportive care  Constipation Plan: -scheduled BR  Best Practice (right click and "Reselect all SmartList Selections" daily)   Diet/type: heart healthy DVT  prophylaxis: prophylactic heparin  GI prophylaxis: PPI Lines: central line yes needed Foley:  N/A Code Status:  full code Last date of multidisciplinary goals of care discussion [9/3 [name]]  CRITICAL CARE Performed by: Lidia Collum   Total critical care time: 35 minutes  JD Anselm Lis [facility] 05/13/2023, 7:53 AM  Please see [email or web address] for pager details.  From 7A-7P if no response, please call [phone]. After hours, please call ELink [phone].

## 2023-05-14 DIAGNOSIS — I2510 Atherosclerotic heart disease of native coronary artery without angina pectoris: Secondary | ICD-10-CM | POA: Diagnosis present

## 2023-05-14 DIAGNOSIS — J449 Chronic obstructive pulmonary disease, unspecified: Secondary | ICD-10-CM | POA: Diagnosis not present

## 2023-05-14 DIAGNOSIS — R001 Bradycardia, unspecified: Secondary | ICD-10-CM | POA: Diagnosis not present

## 2023-05-14 HISTORY — DX: Atherosclerotic heart disease of native coronary artery without angina pectoris: I25.10

## 2023-05-14 LAB — URINALYSIS, W/ REFLEX TO CULTURE (INFECTION SUSPECTED)
Bilirubin Urine: NEGATIVE
Glucose, UA: NEGATIVE mg/dL
Hgb urine dipstick: NEGATIVE
Ketones, ur: 5 mg/dL — AB
Nitrite: POSITIVE — AB
Protein, ur: NEGATIVE mg/dL
Specific Gravity, Urine: 1.018 (ref 1.005–1.030)
WBC, UA: >50 WBC/hpf (ref 0–5)
pH: 5 (ref 5.0–8.0)

## 2023-05-14 LAB — CBC
HCT: 36.7 % (ref 36.0–46.0)
Hemoglobin: 11.7 g/dL — ABNORMAL LOW (ref 12.0–15.0)
MCH: 27.9 pg (ref 26.0–34.0)
MCHC: 31.9 g/dL (ref 30.0–36.0)
MCV: 87.4 fL (ref 80.0–100.0)
Platelets: 279 10*3/uL (ref 150–400)
RBC: 4.2 MIL/uL (ref 3.87–5.11)
RDW: 17.2 % — ABNORMAL HIGH (ref 11.5–15.5)
WBC: 12.8 10*3/uL — ABNORMAL HIGH (ref 4.0–10.5)
nRBC: 0 % (ref 0.0–0.2)

## 2023-05-14 LAB — COMPREHENSIVE METABOLIC PANEL
ALT: 104 U/L — ABNORMAL HIGH (ref 0–44)
AST: 22 U/L (ref 15–41)
Albumin: 2.7 g/dL — ABNORMAL LOW (ref 3.5–5.0)
Alkaline Phosphatase: 71 U/L (ref 38–126)
Anion gap: 16 — ABNORMAL HIGH (ref 5–15)
BUN: 15 mg/dL (ref 8–23)
CO2: 26 mmol/L (ref 22–32)
Calcium: 9 mg/dL (ref 8.9–10.3)
Chloride: 92 mmol/L — ABNORMAL LOW (ref 98–111)
Creatinine, Ser: 0.87 mg/dL (ref 0.44–1.00)
GFR, Estimated: >60 mL/min (ref >60)
Glucose, Bld: 111 mg/dL — ABNORMAL HIGH (ref 70–99)
Potassium: 4.4 mmol/L (ref 3.5–5.1)
Sodium: 134 mmol/L — ABNORMAL LOW (ref 135–145)
Total Bilirubin: 1 mg/dL (ref 0.3–1.2)
Total Protein: 6.1 g/dL — ABNORMAL LOW (ref 6.5–8.1)

## 2023-05-14 LAB — MAGNESIUM: Magnesium: 2.1 mg/dL (ref 1.7–2.4)

## 2023-05-14 LAB — COOXEMETRY PANEL
Carboxyhemoglobin: 1.7 % — ABNORMAL HIGH (ref 0.5–1.5)
Methemoglobin: <0.7 % (ref 0.0–1.5)
O2 Saturation: 64 %
Total hemoglobin: 11.9 g/dL — ABNORMAL LOW (ref 12.0–16.0)

## 2023-05-14 MED ORDER — SPIRIVA RESPIMAT 1.25 MCG/ACT IN AERS: 2.0000 | INHALATION_SPRAY | Freq: Every day | RESPIRATORY_TRACT | 0 refills | Status: AC

## 2023-05-14 MED ORDER — CEFADROXIL 1 G PO TABS: 1.0000 g | ORAL_TABLET | Freq: Two times a day (BID) | ORAL | 0 refills | Status: AC

## 2023-05-14 MED ORDER — ARFORMOTEROL TARTRATE 15 MCG/2ML IN NEBU: 15.0000 ug | INHALATION_SOLUTION | Freq: Two times a day (BID) | RESPIRATORY_TRACT | 0 refills | Status: DC

## 2023-05-14 MED ORDER — ASPIRIN 81 MG PO CHEW: 81.0000 mg | CHEWABLE_TABLET | Freq: Every day | ORAL | 0 refills | Status: DC

## 2023-05-14 MED ORDER — PRAVASTATIN SODIUM 80 MG PO TABS: 80.0000 mg | ORAL_TABLET | Freq: Every day | ORAL | 0 refills | Status: DC

## 2023-05-14 MED ORDER — CLONAZEPAM 1 MG PO TABS: 0.5000 mg | ORAL_TABLET | Freq: Four times a day (QID) | ORAL | Status: AC | PRN

## 2023-05-14 MED ORDER — BUDESONIDE 0.25 MG/2ML IN SUSP: 0.2500 mg | Freq: Two times a day (BID) | RESPIRATORY_TRACT | 0 refills | Status: DC

## 2023-05-14 MED ORDER — PRAVASTATIN SODIUM 80 MG PO TABS: 80.0000 mg | ORAL_TABLET | Freq: Every day | ORAL | 0 refills | Status: AC

## 2023-05-14 MED ORDER — FLUTICASONE PROPIONATE 50 MCG/ACT NA SUSP: 1.0000 | Freq: Two times a day (BID) | NASAL | 0 refills | Status: AC | PRN

## 2023-05-14 NOTE — Care Management Important Message (Signed)
Important Message  Patient Details  Name: CADEE PALSER MRN: [id number] Date of Birth: [birth year]   Medicare Important Message Given:  Yes     Dorena Bodo 05/14/2023, 4:14 PM

## 2023-05-14 NOTE — Discharge Instructions (Addendum)
Dear Angela Phillips,   Thank you so much for allowing [place] to be part of your care!  You were admitted to [facility] for chest pain, dizziness, and nausea with a low heart rate (bradycardia). This was likely due to the dose of metoprolol you took that day.   You had a hospital course where your heart rate was kept up with dobutamine. You were gradually weaned off that medication until your heart could maintain a safe-steady rate. On the day of discharge, you reported some abdominal pain and discomfort while urinating. We will discharge you with a prescription for Cefadroxil to treat the presumed urinary tract infection, take it twice a day for 5 days.   POST-HOSPITAL & CARE INSTRUCTIONS Please let PCP/Specialists know of any changes that were made.  Please see medications section of this packet for any medication changes.   DOCTOR'S APPOINTMENT & FOLLOW UP CARE INSTRUCTIONS  No future appointments. Please follow up with your cardiologist and primary care provider for follow-on care.  RETURN PRECAUTIONS: Please return if you experience chest pain, worsening shortness of breath, or other concerning symptoms. Please follow up with your primary care provider if there is no improvement in your urinary discomfort.  Take care and be well!  Family Medicine Teaching Service    [facility]  [address] [place], [place] [address] [phone]

## 2023-05-14 NOTE — Progress Notes (Addendum)
Advanced Heart Failure Rounding Note   Subjective:    Now off DBA. Co-ox 64%. CVP 2. NSR on tele this morning w/ HR in the 60s. Had transient accelerated junctional rhythm captured on tele overnight.   LFTs continue to trend down. Renal fx stable.   Sitting up in bed. Feels well. Denies CP and dyspnea. Wants to go home.   Objective:   Weight Range:  Vital Signs:   Temp:  [97 F (36.1 C)-98.6 F (37 C)] 98.4 F (36.9 C) (09/04 0320) Pulse Rate:  [69-85] 84 (09/04 0320) Resp:  [16-30] 16 (09/04 0320) BP: (123-150)/(53-89) 133/66 (09/04 0320) SpO2:  [95 %-100 %] 95 % (09/04 0320) Weight:  [51.4 kg] 51.4 kg (09/04 0324) Last BM Date : 05/13/23  Weight change: Filed Weights   05/12/23 0500 05/13/23 0500 05/14/23 0324  Weight: 44.1 kg 44.1 kg 51.4 kg    Intake/Output:   Intake/Output Summary (Last 24 hours) at 05/14/2023 0723 Last data filed at 05/13/2023 1300 Gross per 24 hour  Intake 126.5 ml  Output 400 ml  Net -273.5 ml     PHYSICAL EXAM:] CVP 2  General:  Well appearing. No respiratory difficulty HEENT: normal Neck: supple. no JVD. + left IJ CVC Carotids 2+ bilat; no bruits. No lymphadenopathy or thyromegaly appreciated. Cor: PMI nondisplaced. Regular rate & rhythm. No rubs, gallops or murmurs. Lungs: clear Abdomen: soft, nontender, nondistended. No hepatosplenomegaly. No bruits or masses. Good bowel sounds. Extremities: no cyanosis, clubbing, rash, edema Neuro: alert & oriented x 3, cranial nerves grossly intact. moves all 4 extremities w/o difficulty. Affect pleasant.   Telemetry: NSR 60s currently. Accelerated junctional rhythm captured on tele overnight Personally reviewed  Labs: Basic Metabolic Panel: Recent Labs  Lab 05/10/23 0337 05/11/23 0402 05/12/23 0406 05/13/23 0454 05/14/23 0330  NA 137 134* 131* 135 134*  K 3.5 4.8 4.0 4.4 4.4  CL 94* 94* 95* 97* 92*  CO2 31 32 28 31 26   GLUCOSE 94 124* 93 107* 111*  BUN 19 26* 13 13 15   CREATININE  0.85 0.82 0.75 0.95 0.87  CALCIUM 8.7* 8.6* 8.2* 8.6* 9.0  MG 1.9 2.2 2.0 2.2 2.1    Liver Function Tests: Recent Labs  Lab 05/07/23 1550 05/08/23 0739 05/09/23 0355 05/10/23 0337 05/14/23 0330  AST 378* 486* 397* 190* 22  ALT 346* 538* 566* 395* 104*  ALKPHOS 94 90 81 82 71  BILITOT 0.8 1.2 0.6 1.1 1.0  PROT 5.7* 5.7* 5.1* 5.3* 6.1*  ALBUMIN 3.0* 3.0* 2.9* 2.8* 2.7*   No results for input(s): "LIPASE", "AMYLASE" in the last 168 hours. No results for input(s): "AMMONIA" in the last 168 hours.  CBC: Recent Labs  Lab 05/09/23 1114 05/11/23 0737 05/12/23 0406 05/13/23 0453 05/14/23 0330  WBC 13.0* 11.2* 8.5 9.6 12.8*  HGB 10.6* 11.0* 10.7* 10.5* 11.7*  HCT 33.4* 35.3* 34.4* 34.0* 36.7  MCV 87.2 88.0 86.9 87.0 87.4  PLT 119* 170 186 216 279    Cardiac Enzymes: No results for input(s): "CKTOTAL", "CKMB", "CKMBINDEX", "TROPONINI" in the last 168 hours.  BNP: BNP (last 3 results) Recent Labs    05/11/23 0402 05/12/23 0406 05/13/23 0454  BNP 268.5* 262.9* 126.9*    ProBNP (last 3 results) No results for input(s): "PROBNP" in the last 8760 hours.    Other results:  Imaging: DG CHEST PORT 1 VIEW  Result Date: 05/13/2023 CLINICAL DATA:  Pleural effusion EXAM: PORTABLE CHEST 1 VIEW COMPARISON:  CXR 05/10/23 FINDINGS: Small left-sided  pleural effusion, unchanged from prior exam. No pneumothorax. Possible new right infrahilar airspace opacity, favored to represent atelectasis. No radiographically apparent displaced rib fractures. Visualized upper abdomen is unremarkable. Left-sided central venous catheter with unchanged positioning. Unchanged cardiac and mediastinal contours. Surgical clips in the right upper quadrant IMPRESSION: 1. Small left-sided pleural effusion, unchanged from prior exam. 2. Possible new right infrahilar airspace opacity, favored to represent atelectasis. Electronically Signed   By: Lorenza Cambridge M.D.   On: 05/13/2023 07:52     Medications:      Scheduled Medications:  arformoterol  15 mcg Nebulization BID   aspirin  81 mg Oral Daily   budesonide (PULMICORT) nebulizer solution  0.25 mg Nebulization BID   Chlorhexidine Gluconate Cloth  6 each Topical Daily   clonazePAM  1 mg Oral QHS   digoxin  0.0625 mg Oral Daily   FLUoxetine  20 mg Oral BID   heparin  5,000 Units Subcutaneous Q8H   pantoprazole  40 mg Oral Daily   polyethylene glycol  17 g Oral Daily   pravastatin  80 mg Oral q1800   senna-docusate  1 tablet Oral BID   sodium chloride flush  3 mL Intravenous Q12H    Infusions:  sodium chloride     sodium chloride      PRN Medications: sodium chloride, acetaminophen, albuterol, clonazePAM, fluticasone, ondansetron (ZOFRAN) IV, sodium chloride flush   Assessment:  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 Med management recommended. RHC with elevated R and L filling pressures, PA mean 37, w/ CI 1.67. Large V waves consistent with severe TR. - No PE on chest CT - Suspect incited by b-blocker dose for coronary CTA on top of underlying PH/cor pulmonale in setting of COPD, radiation for lung cancer and hx RA -No PE on CTA chest - She has end-stage RV failure/cor pulmonale. - now off DBA. Co-ox 64%. CVP 1  - Decrease Digoxin 0.125>0.0625. EP worried about conduction issues.  - w/ low CVP, does no need loop diuretic currently     2. Bradycardia - Has not tolerated beta blockers in the past d/t history of symptomatic bradycardia/junctional rhythm - Accelerated junctional rhythm for 2 days. Dobutamine started. Now NSR 70s but had transient accelerated junctional rhythm captured on tele overnight - EP Following. No indication for PPM at this time  - Avoid AV nodal blockers. B-blockers now listed as an  allergy  3. 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 Elevated LFTs -In setting of cardiogenic shock.  - improved. Follow CMP    6. CAD -Diffuse 3 v CAD, vessels are very small in caliber -Managed medically -Continue ASA/statin   7. Left pleural effusion - stable, unchanged on CXR 9/3 - breathing much improved this morning     Length of Stay: 8  Brittainy Simmons PA-C  05/14/2023, 7:23 AM  Advanced Heart Failure Team Pager [phone] (M-F; 7a - 4p)  Please contact [facility] for night-coverage after hours (4p -7a ) and weekends on [email or web address]   Patient seen and  examined with the above-signed Advanced Practice Provider and/or Housestaff. I personally reviewed laboratory data, imaging studies and relevant notes. I independently examined the patient and formulated the important aspects of the plan. I have edited the note to reflect any of my changes or salient points. I have personally discussed the plan with the patient and/or family.  Looks good stable off DBA. Wants to go home. Rhythm stable  General:  Sitting up in bed. No resp difficulty HEENT: normal Neck: supple. no JVD. Carotids 2+ bilat; no bruits. No lymphadenopathy or thryomegaly appreciated. Cor: PMI nondisplaced. Regular rate & rhythm. No rubs, gallops or murmurs. Lungs: decreased at bases Abdomen: soft, nontender, nondistended. No hepatosplenomegaly. No bruits or masses. Good bowel sounds. Extremities: no cyanosis, clubbing, rash, edema Neuro: alert & orientedx3, cranial nerves grossly intact. moves all 4 extremities w/o difficulty. Affect pleasant  Ok for d/c today from cardiac standpoint. Can stop digoxin for now. With low CVP and improved RV function. Would avoid any  other AV nodal blockers (b-blockers, calcium channel blockers)   Arvilla Meres, MD  9:02 AM

## 2023-05-14 NOTE — Hospital Course (Addendum)
Angela Phillips is a 77 y.o.female with a history of COPD, HTN, SVT, HLD, lung cancer treated with SBRT, CAD, RA who was admitted to the [facility] Medicine Teaching Service at [facility] for symptomatic bradycardia that ended up causing cardiogenic shock. Her hospital course is detailed below:  Bradycardia Pt became brady to 40's and symptomatic s/p 1 dose metop 50 mg for coronary CTA and sent to ED.  EKG showed sinus bradycardia with RBBB with repeat showing junctional escape rhythm.  D-dimer 0.99, lactate 2, BNP 403.7, TSH 8.729 and T4 41.17. She was admitted to the ICU for 7 days requiring calcium gluconate, low-dose dopamine titrate, and digoxin. Antihypertensives were held. B-blockers were stopped.  Cardiogenic shock  HFpEF w/ RV failure Heart failure likely incited by beta-blocker dose on top of her underlying pulmonary HTN/cor pulmonale in the setting of COPD, radiation for lung cancer. L&R heart cath showed three-vessel CAD not amenable to PCI, moderately elevated LV filling pressures and PCWP, moderate pulmonary hypertension, high right atrial pressures with large V wave with concern for severe TR, and low cardiac output. LFTs elevated likely in the setting of shock. Patient was seen by heart failure clinic due to cardiogenic shock. Repeat echo showed EF 55-60%, moderate enlargement of right ventricle, moderate TR, worsened from previous echo.  Dobutamine started due to patient's junctional rhythm to avoid AV nodal blockers.  Digoxin was added and dobutamine was weaned off while monitoring co-ox. Digoxin was weaned, and stopped by 9/3 and transferred to [facility] Teaching Service. Pt remained hemodynamically stable on home 2L [place] and ready for discharge. PT recommended dispo to SNF, but pt declines. Offered HH PT instead and pt is agreeable.   UTI  Developed dysuria while admitted and reports similar sensation to prior UTI. UA c/w infection. Afebrile. Discharged w/ 5day course of Cefadroxil.  Other chronic  conditions were medically managed with home medications and formulary alternatives as necessary: COPD, asthma: Albuterol neb Q6h PRN and 2 L O2 at home. Was on CPAP q HS. O2 goal 88-92%. Started Brovana and Pulmicort and Spiriva. GERD: Pantoprazole 40 mg daily Anxiety/Depression: Fluoxetine 20 mg twice daily and Klonopin 0.5 mg up to three times a day as needed for anxiety. Also discussed starting mirtazapine outpatient, which she can discuss with her PCP. HLD: Switching simvastatin to pravastatin 80 mg once daily, per CCM recs.  PCP Follow-up Recommendations: Sleep study Follow up on UTI Consider adding mirtazapine for anxiety, depression, appetite, and insomnia. Discontinue NSAIDs (celecoxib, diclofenac, ibuprofen)  Adding Spiriva at discharge Review CT Angio Chest needs

## 2023-05-14 NOTE — Progress Notes (Addendum)
Occupational Therapy Treatment Patient Details Name: Angela Phillips MRN: [id number] DOB: [birth year] Today's Date: 05/14/2023   History of present illness Pt is 77 year old presented to [facility] on  05/06/23 for chest pain, dizziness, and nausea with HR 30's after taking metoprolol earlier in the day. 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 PMH - lung CA, asthma, atypical chest pain, copd, htn, SVT, breast CA   OT comments  Pt progressing towards goals this session, able to complete x2 seated grooming tasks on rollator. Pt neeidng CGA overall for transfers/mobility in room, min A for bed mobility. Pt needs incr encouragement to participate in mobility, educated importance of mobilizing/ind with ADLs given her spouse cannot physically assist. Pt declines tub transfer practice at this time, educated on energy conservation strategies for home and activity pacing. Pt presenting with impairments listed below, will follow acutely. Continue to recommend HHOT at d/c.       If plan is discharge home, recommend the following:  A little help with walking and/or transfers;A little help with bathing/dressing/bathroom;Assistance with cooking/housework;Direct supervision/assist for medications management;Direct supervision/assist for financial management;Assist for transportation;Help with stairs or ramp for entrance   Equipment Recommendations  Tub/shower seat    Recommendations for Other Services PT consult    Precautions / Restrictions Precautions Precautions: Fall Restrictions Weight Bearing Restrictions: No       Mobility Bed Mobility Overal bed mobility: Needs Assistance Bed Mobility: Supine to Sit     Supine to sit: Min assist Sit to supine: Contact guard assist   General bed mobility comments: incr time to bring LE into bed    Transfers Overall transfer level: Needs assistance Equipment used: Rollator (4 wheels) Transfers: Sit to/from Stand Sit to Stand:  Min assist                 Balance Overall balance assessment: Needs assistance Sitting-balance support: Bilateral upper extremity supported, Feet supported Sitting balance-Leahy Scale: Good Sitting balance - Comments: UE support   Standing balance support: Bilateral upper extremity supported Standing balance-Leahy Scale: Fair Standing balance comment: static standing with minimal UE support,                           ADL either performed or assessed with clinical judgement   ADL Overall ADL's : Needs assistance/impaired     Grooming: Oral care;Wash/dry face;Set up;Sitting Grooming Details (indicate cue type and reason): seated on rollator due to fatigue             Lower Body Dressing: Minimal assistance;Bed level Lower Body Dressing Details (indicate cue type and reason): pulls up socks bringing knee to chest/figure 4 Toilet Transfer: Contact guard assist;Ambulation;Regular Toilet;Rollator (4 wheels) Toilet Transfer Details (indicate cue type and reason): simulated         Functional mobility during ADLs: Contact guard assist;Rollator (4 wheels)      Extremity/Trunk Assessment Upper Extremity Assessment Upper Extremity Assessment: Generalized weakness   Lower Extremity Assessment Lower Extremity Assessment: Defer to PT evaluation        Vision   Vision Assessment?: No apparent visual deficits   Perception Perception Perception: Not tested   Praxis Praxis Praxis: Not tested    Cognition Arousal: Alert Behavior During Therapy: [facility] for tasks assessed/performed Overall Cognitive Status: Within Functional Limits for tasks assessed  Exercises      Shoulder Instructions       General Comments VSS on RA    Pertinent Vitals/ Pain       Pain Assessment Pain Assessment: No/denies pain  Home Living                                          Prior  Functioning/Environment              Frequency  Min 1X/week        Progress Toward Goals  OT Goals(current goals can now be found in the care plan section)  Progress towards OT goals: Progressing toward goals  Acute Rehab OT Goals Patient Stated Goal: none stated OT Goal Formulation: With patient Time For Goal Achievement: 05/26/23 Potential to Achieve Goals: Good ADL Goals Pt Will Perform Upper Body Dressing: with supervision;sitting Pt Will Perform Lower Body Dressing: with min assist;sit to/from stand;sitting/lateral leans Pt Will Transfer to Toilet: with contact guard assist;ambulating;regular height toilet Pt Will Perform Tub/Shower Transfer: Tub transfer;Shower transfer;with contact guard assist;ambulating  Plan      Co-evaluation                 AM-PAC OT "6 Clicks" Daily Activity     Outcome Measure   Help from another person eating meals?: None Help from another person taking care of personal grooming?: A Little Help from another person toileting, which includes using toliet, bedpan, or urinal?: A Little Help from another person bathing (including washing, rinsing, drying)?: A Lot Help from another person to put on and taking off regular upper body clothing?: A Little Help from another person to put on and taking off regular lower body clothing?: A Little 6 Click Score: 18    End of Session Equipment Utilized During Treatment: Gait belt;Rollator (4 wheels)  OT Visit Diagnosis: Unsteadiness on feet (R26.81);Other abnormalities of gait and mobility (R26.89);Muscle weakness (generalized) (M62.81)   Activity Tolerance Patient tolerated treatment well   Patient Left in bed;with call bell/phone within reach;with bed alarm set   Nurse Communication Mobility status        Time: 1019-1040 OT Time Calculation (min): 21 min  Charges: OT General Charges $OT Visit: 1 Visit OT Treatments $Self Care/Home Management : 8-22 mins  Carver Fila, OTD,  OTR/L SecureChat Preferred Acute Rehab [phone]   Carver Fila Koonce 05/14/2023, 10:47 AM

## 2023-05-14 NOTE — Progress Notes (Signed)
PT Cancellation Note  Patient Details Name: Angela Phillips MRN: [id number] DOB: 01-27-1946   Cancelled Treatment:    Reason Eval/Treat Not Completed: Fatigue/lethargy limiting ability to participate. Pt declines mobilizing at this time due to fatigue. PT will attempt to follow up as time allows.   Arlyss Gandy 05/14/2023, 1:40 PM

## 2023-05-14 NOTE — Progress Notes (Signed)
PT Cancellation Note  Patient Details Name: Angela Phillips MRN: [id number] DOB: 28-Aug-1946   Cancelled Treatment:    Reason Eval/Treat Not Completed: Patient declined, no reason specified. Pt declines PT treatment on 2nd attempt. Pt reports recently ambulating to bathroom, does report some difficulty with this, but continues to decline PT intervention at this time. PT will follow up until discharge is complete.   Arlyss Gandy 05/14/2023, 5:13 PM

## 2023-05-14 NOTE — Progress Notes (Signed)
Patient provided with verbal discharge instructions. Paper copy of discharge provided to patient. RN answered all questions. VSS at discharge. IV's removed. Triple lumen internal jugular removed. Pt laid in supine position for 30 mins after removal.  Patient belongings sent with patient. Patient dc'd via wheelchair to private vehicle through the [employer]

## 2023-05-14 NOTE — Assessment & Plan Note (Signed)
Symptomatic bradycardia in setting of beta-blocker administration.  Continues to be bradycardic at this time with intermittent sinus brady and  junctional escape rhythm.  Expect improvement as metoprolol continues to be metabolized. -Admit to FMTS, progressive care, attending Dr. Terisa Starr -Continuous cardiac and SpO2 monitoring -Avoid beta-blockers and diltiazem, holding these home meds -Status post 1 L bolus x 2, monitor BP closely -Compazine as needed for nausea -AM BMP and mag, goal K>4, mag>2 -Cardiology consulted, appreciate recs: -N.p.o. at midnight for left and right cath tomorrow -Do not see a benefit to glucagon administration given time since metoprolol administration -High risk for needing higher level of care for inotropic/chronotropic support. Monitor closely for now.

## 2023-05-14 NOTE — Discharge Summary (Addendum)
Family Medicine Teaching [facility] Discharge Summary  Patient name: Angela Phillips Medical record number: [id number] Date of birth: [birth year] Age: 77 y.o. Gender: female Date of Admission: 05/06/2023  Date of Discharge: 05/14/23  Admitting Physician: Oretha Milch, MD  Primary Care Provider: Hurshel Party, NP Consultants: Cardiology, Heart Failure, PT. OT,  Indication for Hospitalization: Symptomatic bradycardia which led to cardiogenic shock, shock liver, and an acute kidney injury  Brief Hospital Course:  Angela Phillips is a 77 y.o.female with a history of COPD, HTN, SVT, HLD, lung cancer treated with SBRT, CAD, RA who was admitted to the [facility] Medicine Teaching Service at [facility] for symptomatic bradycardia that ended up causing cardiogenic shock. Her hospital course is detailed below:  Bradycardia Pt became brady to 40's and symptomatic s/p 1 dose metop 50 mg for coronary CTA and sent to ED.  EKG showed sinus bradycardia with RBBB with repeat showing junctional escape rhythm.  D-dimer 0.99, lactate 2, BNP 403.7, TSH 8.729 and T4 41.17. She was admitted to the ICU for 7 days requiring calcium gluconate, low-dose dopamine titrate, and digoxin. Antihypertensives were held. B-blockers were stopped.  Cardiogenic shock  HFpEF w/ RV failure Heart failure likely incited by beta-blocker dose on top of her underlying pulmonary HTN/cor pulmonale in the setting of COPD, radiation for lung cancer. L&R heart cath showed three-vessel CAD not amenable to PCI, moderately elevated LV filling pressures and PCWP, moderate pulmonary hypertension, high right atrial pressures with large V wave with concern for severe TR, and low cardiac output. LFTs elevated likely in the setting of shock. Patient was seen by heart failure clinic due to cardiogenic shock. Repeat echo showed EF 55-60%, moderate enlargement of right ventricle, moderate TR, worsened from previous echo.  Dobutamine started due to patient's  junctional rhythm to avoid AV nodal blockers.  Digoxin was added and dobutamine was weaned off while monitoring co-ox. Digoxin was weaned, and stopped by 9/3 and transferred to [facility] Teaching Service. Pt remained hemodynamically stable on home 2L [place] and ready for discharge. PT recommended dispo to SNF, but pt declines. Offered HH PT instead and pt is agreeable.   UTI  Developed dysuria while admitted and reports similar sensation to prior UTI. UA c/w infection. Afebrile. Discharged w/ 5day course of Cefadroxil.  Other chronic conditions were medically managed with home medications and formulary alternatives as necessary: COPD, asthma: Albuterol neb Q6h PRN and 2 L O2 at home. Was on CPAP q HS. O2 goal 88-92%. Started Brovana and Pulmicort and Spiriva. GERD: Pantoprazole 40 mg daily Anxiety/Depression: Fluoxetine 20 mg twice daily and Klonopin 0.5 mg up to three times a day as needed for anxiety. Also discussed starting mirtazapine outpatient, which she can discuss with her PCP. HLD: Switching simvastatin to pravastatin 80 mg once daily, per CCM recs.  PCP Follow-up Recommendations: Sleep study Follow up on UTI Consider adding mirtazapine for anxiety, depression, appetite, and insomnia. Discontinue NSAIDs (celecoxib, diclofenac, ibuprofen)  Adding Spiriva at discharge Review CT Angio Chest needs   Discharge Diagnoses/Problem List:  Cardiogenic shock, HFpEF, bradycardia, COPD   Disposition: Home with home health  Discharge Condition: Stable  Discharge Exam:  VITALS: Reviewed. WEIGHT/BMI reviewed. GEN: Healthy appearing, well-developed, NAD. CV: RRR, no m/r/g. LUNGS: Pt has mild wheezing, most noticeable in Left lower lobe. ABD: Soft, Tender in suprapubic area, NBS GU: N/A SKIN: Cool, dry. No skin rashes or abnormal lesions. EXT: No clubbing, cyanosis, or edema. NEURO: No focal deficits.  PSYCH: Good Judgment.  AOx3. Normal memory, mood, and affect.  Significant Procedures: Cardiac  catheterization 8/29.  Significant Labs and Imaging:  Recent Labs  Lab 05/13/23 0453 05/14/23 0330  WBC 9.6 12.8*  HGB 10.5* 11.7*  HCT 34.0* 36.7  PLT 216 279   Recent Labs  Lab 05/13/23 0454 05/14/23 0330  NA 135 134*  K 4.4 4.4  CL 97* 92*  CO2 31 26  GLUCOSE 107* 111*  BUN 13 15  CREATININE 0.95 0.87  CALCIUM 8.6* 9.0  MG 2.2 2.1  ALKPHOS  --  71  AST  --  22  ALT  --  104*  ALBUMIN  --  2.7*   Other exams: Chest X Ray 05/13/2023 EXAM: PORTABLE CHEST 1 VIEW COMPARISON:  CXR 05/10/23 IMPRESSION: 1. Small left-sided pleural effusion, unchanged from prior exam. 2. Possible new right infrahilar airspace opacity, favored to represent atelectasis.  Results/Tests Pending at Time of Discharge:  Urinalysis with cultures pending. If the antibiotic prescribed by the team does NOT cover the identified organism, the team will advise you.  Discharge Medications:  Allergies as of 05/14/2023       Reactions   Beta Adrenergic Blockers Other (See Comments)   Bradycardia--decompensated after 1 dose of metoprolol prior to cardiac stress test   Aspirin Other (See Comments)   Bleeding (intolerance)   Atorvastatin    Other Reaction(s): Unknown   Buspirone Other (See Comments)   Patient does not recall taking this med   Celecoxib Other (See Comments)   Patient cannot remember reaction   Chlorpheniramine Other (See Comments)   Patient cannot remember reaction   Citalopram Other (See Comments)   Patient cannot remember reaction   Diltiazem Other (See Comments)   Bradycardia   Ezetimibe Other (See Comments)   Myalgias (intolerance)   Ezetimibe-simvastatin    Other Reaction(s): Unknown   Hydrocodone-acetaminophen Itching   Prochlorperazine Other (See Comments)   "Makes me feel like I'm having a face stroke"   Rosuvastatin    Other Reaction(s): Unknown   Venlafaxine Other (See Comments)   Patient cannot remember reaction   Verapamil Other (See Comments)   Bradycardia         Medication List     STOP taking these medications    celecoxib 200 MG capsule Commonly known as: CELEBREX   diclofenac 75 MG EC tablet Commonly known as: VOLTAREN   diltiazem 120 MG 24 hr capsule Commonly known as: CARDIZEM CD   simvastatin 40 MG tablet Commonly known as: ZOCOR       TAKE these medications    albuterol 108 (90 Base) MCG/ACT inhaler Commonly known as: VENTOLIN HFA Inhale 2 puffs into the lungs every 6 (six) hours as needed for wheezing or shortness of breath.   albuterol (2.5 MG/3ML) 0.083% nebulizer solution Commonly known as: PROVENTIL Take 2.5 mg by nebulization every 6 (six) hours as needed for wheezing or shortness of breath.   arformoterol 15 MCG/2ML Nebu Commonly known as: BROVANA Take 2 mLs (15 mcg total) by nebulization 2 (two) times daily.   aspirin 81 MG chewable tablet Chew 1 tablet (81 mg total) by mouth daily.   budesonide 0.25 MG/2ML nebulizer solution Commonly known as: PULMICORT Take 2 mLs (0.25 mg total) by nebulization 2 (two) times daily.   cefadroxil 1 g tablet Commonly known as: DURICEF Take 1 tablet (1 g total) by mouth 2 (two) times daily for 5 days.   clonazePAM 1 MG tablet Commonly known as: KLONOPIN Take 0.5 tablets (0.5 mg  total) by mouth every 6 (six) hours as needed for anxiety. What changed: how much to take   FLUoxetine 20 MG capsule Commonly known as: PROZAC Take 20 mg by mouth 2 (two) times daily.   fluticasone 50 MCG/ACT nasal spray Commonly known as: FLONASE Place 1 spray into both nostrils 2 (two) times daily as needed for allergies or rhinitis.   gabapentin 300 MG capsule Commonly known as: NEURONTIN Take 300 mg by mouth at bedtime.   Multi-Vitamins Tabs Take 1 tablet by mouth daily. Centrum silver   omeprazole 40 MG capsule Commonly known as: PRILOSEC Take 40 mg by mouth daily.   pravastatin 80 MG tablet Commonly known as: PRAVACHOL Take 1 tablet (80 mg total) by mouth daily at 6 PM.    Spiriva Respimat 1.25 MCG/ACT Aers Generic drug: Tiotropium Bromide Monohydrate Inhale 2 Inhalations into the lungs daily.        Discharge Instructions: Please refer to Patient Instructions section of EMR for full details.  Patient was counseled important signs and symptoms that should prompt return to medical care, changes in medications, dietary instructions, activity restrictions, and follow up appointments.   Follow-Up Appointments: As recommended by cardiology.   Please follow-up with your PCP in one week.  Follow-up Information     Moon, Amy A, NP Follow up.   Specialty: Internal Medicine Why: please call to arrange hospital follow up in 1-2 weeks. Contact information: [address] [place] [address] [phone]         Care, [facility] Follow up.   Specialty: Home Health Services Why: Home Health RN and Physical Therapy-agency will call to arrange follow up appointment Contact information: [address] [place] [address] [phone]                 Lincoln Brigham, MD 05/14/2023, 2:55 PM PGY-2, [facility] Family Medicine

## 2023-05-14 NOTE — TOC Progression Note (Addendum)
Transition of Care [facility]) - Progression Note    Patient Details  Name: Angela Phillips MRN: [id number] Date of Birth: 04-22-1946  Transition of Care [facility]) CM/SW Contact  Elliot Cousin, RN Phone Number: [phone] 05/14/2023, 1:54 PM  Clinical Narrative:   Spoke to pt and husband. Pt is agreeable to [facility]. States she had in the past. Pt had with [facility]. Offered choice, medicare.gov list given. Contacted Trey Paula, Cory with new referral. Pt reports having RW at home. Husband requested a shower chair, out of pocket is $67. Pt can purchase on [employer] for $26. States she will have her dtr purchase on off [employer].   Spoke to pt and she wants to arrange her PCP follow up appt. States her husband has appts and will have surgery soon. Has good family support. Husband states he will have someone to assist her in the home when he has surgery.    Frances Furbish accepted referral for [facility].    Expected Discharge Plan: Home w Home Health Services Barriers to Discharge: No Barriers Identified  Expected Discharge Plan and Services   Discharge Planning Services: CM Consult Post Acute Care Choice: Home Health Living arrangements for the past 2 months: Single Family Home                           HH Arranged: RN, PT [facility] Agency: [facility] Date [facility] Agency Contacted: 05/14/23 Time HH Agency Contacted: 1353 Representative spoke with at [facility] Agency: [facility]   Social Determinants of Health (SDOH) Interventions SDOH Screenings   Food Insecurity: No Food Insecurity (05/07/2023)  Housing: Low Risk  (05/07/2023)  Transportation Needs: No Transportation Needs (05/07/2023)  Utilities: Not At Risk (05/07/2023)  Alcohol Screen: Low Risk  (08/20/2022)  Depression (PHQ2-9): Low Risk  (08/20/2022)  Financial Resource Strain: Low Risk  (08/20/2022)  Physical Activity: Inactive (08/20/2022)  Social Connections: Moderately Isolated (08/20/2022)  Stress: Stress Concern Present (08/20/2022)   Tobacco Use: Medium Risk (05/13/2023)    Readmission Risk Interventions     No data to display

## 2023-05-15 DIAGNOSIS — J45909 Unspecified asthma, uncomplicated: Secondary | ICD-10-CM | POA: Diagnosis not present

## 2023-05-15 NOTE — Progress Notes (Signed)
Was d/ced home with antibiotic for UTI. Following sensitivity report.

## 2023-05-16 ENCOUNTER — Other Ambulatory Visit: Payer: Self-pay | Admitting: Family Medicine

## 2023-05-16 ENCOUNTER — Telehealth: Payer: Self-pay | Admitting: Family Medicine

## 2023-05-16 ENCOUNTER — Encounter (HOSPITAL_COMMUNITY): Payer: Self-pay

## 2023-05-16 LAB — URINE CULTURE: Culture: >=100000 — AB

## 2023-05-16 NOTE — Telephone Encounter (Signed)
HIPAA compliant callback message left. Both mobile and land-line numbers were called.  Urine culture positive. Culture is sensitive to the antibiotic she was sent home home.  If she calls, ensure she takes her antibiotic and follow up with PCP for reassessment.

## 2023-05-16 NOTE — Progress Notes (Signed)
Encounter entered in error.

## 2023-05-17 ENCOUNTER — Other Ambulatory Visit: Payer: Self-pay | Admitting: Family Medicine

## 2023-05-17 MED ORDER — SULFAMETHOXAZOLE-TRIMETHOPRIM 800-160 MG PO TABS: 1.0000 | ORAL_TABLET | Freq: Two times a day (BID) | ORAL | 0 refills | Status: AC

## 2023-05-17 NOTE — Progress Notes (Signed)
Patient was recently discharged from the hospital after being treated for cardiogenic shock. Patient was found to have a UTI upon admission and was discharged on 5 day course of Cefadroxil. Culture recently resulted revealing Providencia Stuartii which isn't susceptible to Cefadroxil. Ordering Bactrim for 5 days based on sensitivities. Will ask day team to give patient a call regarding her culture and new course of treatment.   Fortunato Curling, DO [facility] Family Medicine, PGY-1 05/17/23 12:38 AM

## 2023-05-20 DIAGNOSIS — I471 Supraventricular tachycardia, unspecified: Secondary | ICD-10-CM | POA: Diagnosis not present

## 2023-05-20 DIAGNOSIS — F411 Generalized anxiety disorder: Secondary | ICD-10-CM | POA: Diagnosis not present

## 2023-05-20 DIAGNOSIS — K219 Gastro-esophageal reflux disease without esophagitis: Secondary | ICD-10-CM | POA: Diagnosis not present

## 2023-05-20 DIAGNOSIS — N189 Chronic kidney disease, unspecified: Secondary | ICD-10-CM | POA: Diagnosis not present

## 2023-05-20 DIAGNOSIS — G629 Polyneuropathy, unspecified: Secondary | ICD-10-CM | POA: Diagnosis not present

## 2023-05-20 DIAGNOSIS — E785 Hyperlipidemia, unspecified: Secondary | ICD-10-CM | POA: Diagnosis not present

## 2023-05-20 DIAGNOSIS — E039 Hypothyroidism, unspecified: Secondary | ICD-10-CM | POA: Diagnosis not present

## 2023-05-20 DIAGNOSIS — R32 Unspecified urinary incontinence: Secondary | ICD-10-CM | POA: Diagnosis not present

## 2023-05-20 DIAGNOSIS — Z79899 Other long term (current) drug therapy: Secondary | ICD-10-CM | POA: Diagnosis not present

## 2023-05-20 DIAGNOSIS — H269 Unspecified cataract: Secondary | ICD-10-CM | POA: Diagnosis not present

## 2023-05-20 DIAGNOSIS — I2510 Atherosclerotic heart disease of native coronary artery without angina pectoris: Secondary | ICD-10-CM | POA: Diagnosis not present

## 2023-05-20 DIAGNOSIS — Z008 Encounter for other general examination: Secondary | ICD-10-CM | POA: Diagnosis not present

## 2023-05-20 DIAGNOSIS — M1990 Unspecified osteoarthritis, unspecified site: Secondary | ICD-10-CM | POA: Diagnosis not present

## 2023-06-02 ENCOUNTER — Telehealth: Payer: Self-pay | Admitting: Cardiology

## 2023-06-02 NOTE — Telephone Encounter (Signed)
Advised that while in the hospital they ausculted her neck and no bruits were noted. Pt verbalized understanding and had no additional questions.

## 2023-06-02 NOTE — Telephone Encounter (Signed)
Pt states that she would like a callback regarding Artery in her neck. Please advise

## 2023-06-06 DIAGNOSIS — E1165 Type 2 diabetes mellitus with hyperglycemia: Secondary | ICD-10-CM | POA: Diagnosis not present

## 2023-06-06 DIAGNOSIS — J4540 Moderate persistent asthma, uncomplicated: Secondary | ICD-10-CM | POA: Diagnosis not present

## 2023-06-06 DIAGNOSIS — F411 Generalized anxiety disorder: Secondary | ICD-10-CM | POA: Diagnosis not present

## 2023-06-06 DIAGNOSIS — Z6820 Body mass index (BMI) 20.0-20.9, adult: Secondary | ICD-10-CM | POA: Diagnosis not present

## 2023-06-06 DIAGNOSIS — N390 Urinary tract infection, site not specified: Secondary | ICD-10-CM | POA: Diagnosis not present

## 2023-06-06 DIAGNOSIS — E538 Deficiency of other specified B group vitamins: Secondary | ICD-10-CM | POA: Diagnosis not present

## 2023-06-06 DIAGNOSIS — Z79899 Other long term (current) drug therapy: Secondary | ICD-10-CM | POA: Diagnosis not present

## 2023-06-06 DIAGNOSIS — J9611 Chronic respiratory failure with hypoxia: Secondary | ICD-10-CM | POA: Diagnosis not present

## 2023-06-06 DIAGNOSIS — I471 Supraventricular tachycardia, unspecified: Secondary | ICD-10-CM | POA: Diagnosis not present

## 2023-06-06 DIAGNOSIS — I2510 Atherosclerotic heart disease of native coronary artery without angina pectoris: Secondary | ICD-10-CM | POA: Diagnosis not present

## 2023-06-06 DIAGNOSIS — I5031 Acute diastolic (congestive) heart failure: Secondary | ICD-10-CM | POA: Diagnosis not present

## 2023-06-06 DIAGNOSIS — R519 Headache, unspecified: Secondary | ICD-10-CM | POA: Diagnosis not present

## 2023-06-06 DIAGNOSIS — R5381 Other malaise: Secondary | ICD-10-CM | POA: Diagnosis not present

## 2023-06-14 DIAGNOSIS — J45909 Unspecified asthma, uncomplicated: Secondary | ICD-10-CM | POA: Diagnosis not present

## 2023-06-17 ENCOUNTER — Other Ambulatory Visit: Payer: Self-pay | Admitting: Oncology

## 2023-06-17 DIAGNOSIS — C3412 Malignant neoplasm of upper lobe, left bronchus or lung: Secondary | ICD-10-CM

## 2023-06-20 DIAGNOSIS — R5383 Other fatigue: Secondary | ICD-10-CM | POA: Diagnosis not present

## 2023-06-20 DIAGNOSIS — Z23 Encounter for immunization: Secondary | ICD-10-CM | POA: Diagnosis not present

## 2023-06-20 DIAGNOSIS — Z6820 Body mass index (BMI) 20.0-20.9, adult: Secondary | ICD-10-CM | POA: Diagnosis not present

## 2023-06-20 DIAGNOSIS — E538 Deficiency of other specified B group vitamins: Secondary | ICD-10-CM | POA: Diagnosis not present

## 2023-06-20 DIAGNOSIS — R5381 Other malaise: Secondary | ICD-10-CM | POA: Diagnosis not present

## 2023-06-20 DIAGNOSIS — I471 Supraventricular tachycardia, unspecified: Secondary | ICD-10-CM | POA: Diagnosis not present

## 2023-06-20 DIAGNOSIS — I5031 Acute diastolic (congestive) heart failure: Secondary | ICD-10-CM | POA: Diagnosis not present

## 2023-06-25 ENCOUNTER — Telehealth: Payer: Self-pay | Admitting: Cardiology

## 2023-06-25 DIAGNOSIS — R002 Palpitations: Secondary | ICD-10-CM | POA: Diagnosis not present

## 2023-06-25 DIAGNOSIS — R9389 Abnormal findings on diagnostic imaging of other specified body structures: Secondary | ICD-10-CM | POA: Diagnosis not present

## 2023-06-25 DIAGNOSIS — R42 Dizziness and giddiness: Secondary | ICD-10-CM | POA: Diagnosis not present

## 2023-06-25 DIAGNOSIS — I4510 Unspecified right bundle-branch block: Secondary | ICD-10-CM | POA: Diagnosis not present

## 2023-06-25 DIAGNOSIS — R0989 Other specified symptoms and signs involving the circulatory and respiratory systems: Secondary | ICD-10-CM | POA: Diagnosis not present

## 2023-06-25 DIAGNOSIS — R531 Weakness: Secondary | ICD-10-CM | POA: Diagnosis not present

## 2023-06-25 DIAGNOSIS — R9431 Abnormal electrocardiogram [ECG] [EKG]: Secondary | ICD-10-CM | POA: Diagnosis not present

## 2023-06-25 NOTE — Telephone Encounter (Signed)
Called patient and she reported that she was having pain all the way across her neck  and chest pressure and chest pain in the center of her chest two times today. These episodes have been happening since September 4th. Some times it happens all day and some days it is off and on. When these episodes occur she has headaches and she becomes dizzy and weak and she has passed out once already. Based on her symptoms she reported I recommended that she go to the ER to be evaluated. Patient stated that she would go to the ER and had no further questions at this time.

## 2023-06-25 NOTE — Telephone Encounter (Signed)
Nurse reported that patient's heart was beating out of chest and could feel heart beat.

## 2023-06-26 ENCOUNTER — Telehealth: Payer: Self-pay | Admitting: Cardiology

## 2023-06-26 ENCOUNTER — Ambulatory Visit: Payer: Medicare HMO | Admitting: Cardiology

## 2023-06-26 NOTE — Telephone Encounter (Signed)
Patient wants a provider switch from Dr. Tomie China to Dr. Dulce Sellar.  Please confirm.

## 2023-07-02 ENCOUNTER — Telehealth: Payer: Self-pay | Admitting: Cardiology

## 2023-07-02 NOTE — Telephone Encounter (Signed)
Pt c/o of Chest Pain: STAT if CP now or developed within 24 hours  1. Are you having CP right now? Yes  2. Are you experiencing any other symptoms (ex. SOB, nausea, vomiting, sweating)? Dizziness, SOB, headache  3. How long have you been experiencing CP? Since yesterday and her face was numb on the left side   4. Is your CP continuous or coming and going? Coming and going  5. Have you taken Nitroglycerin? No   ?

## 2023-07-02 NOTE — Telephone Encounter (Signed)
Called patient and she reported that she was having chest pain on the left side of her chest that went up into her neck. She was also Lightheaded and dizzy, SOB and she had a headache. She also reported that she had passed out once already. Today she reported that the left side of her face was numb. Based on these symptoms I recommended that she go to the ER to be evaluated. Patient stated that she had been in the ER last Monday and they sent her home and told her to follow up with her cardiologist. I explained that now she has new symptoms that she is reporting and her symptoms are getting worse and I recommend that she go to the ER to be evaluated. Patient said ok and hung up the phone.

## 2023-07-04 ENCOUNTER — Other Ambulatory Visit: Payer: Self-pay | Admitting: Oncology

## 2023-07-04 ENCOUNTER — Encounter: Payer: Self-pay | Admitting: Oncology

## 2023-07-04 ENCOUNTER — Inpatient Hospital Stay: Payer: Medicare HMO | Attending: Oncology | Admitting: Oncology

## 2023-07-04 VITALS — BP 135/68 | HR 76 | Temp 97.6°F | Resp 18 | Ht 61.0 in | Wt 112.1 lb

## 2023-07-04 DIAGNOSIS — I2510 Atherosclerotic heart disease of native coronary artery without angina pectoris: Secondary | ICD-10-CM | POA: Insufficient documentation

## 2023-07-04 DIAGNOSIS — Z888 Allergy status to other drugs, medicaments and biological substances status: Secondary | ICD-10-CM | POA: Diagnosis not present

## 2023-07-04 DIAGNOSIS — Z885 Allergy status to narcotic agent status: Secondary | ICD-10-CM | POA: Insufficient documentation

## 2023-07-04 DIAGNOSIS — I499 Cardiac arrhythmia, unspecified: Secondary | ICD-10-CM | POA: Insufficient documentation

## 2023-07-04 DIAGNOSIS — Z7951 Long term (current) use of inhaled steroids: Secondary | ICD-10-CM | POA: Diagnosis not present

## 2023-07-04 DIAGNOSIS — I471 Supraventricular tachycardia, unspecified: Secondary | ICD-10-CM | POA: Diagnosis not present

## 2023-07-04 DIAGNOSIS — Z79899 Other long term (current) drug therapy: Secondary | ICD-10-CM | POA: Diagnosis not present

## 2023-07-04 DIAGNOSIS — C3412 Malignant neoplasm of upper lobe, left bronchus or lung: Secondary | ICD-10-CM | POA: Insufficient documentation

## 2023-07-04 DIAGNOSIS — Z886 Allergy status to analgesic agent status: Secondary | ICD-10-CM | POA: Insufficient documentation

## 2023-07-04 DIAGNOSIS — J90 Pleural effusion, not elsewhere classified: Secondary | ICD-10-CM | POA: Insufficient documentation

## 2023-07-04 DIAGNOSIS — J449 Chronic obstructive pulmonary disease, unspecified: Secondary | ICD-10-CM | POA: Insufficient documentation

## 2023-07-04 DIAGNOSIS — Z9981 Dependence on supplemental oxygen: Secondary | ICD-10-CM | POA: Insufficient documentation

## 2023-07-04 DIAGNOSIS — I700 Atherosclerosis of aorta: Secondary | ICD-10-CM | POA: Insufficient documentation

## 2023-07-04 DIAGNOSIS — M8008XA Age-related osteoporosis with current pathological fracture, vertebra(e), initial encounter for fracture: Secondary | ICD-10-CM | POA: Insufficient documentation

## 2023-07-04 DIAGNOSIS — I082 Rheumatic disorders of both aortic and tricuspid valves: Secondary | ICD-10-CM | POA: Diagnosis not present

## 2023-07-04 DIAGNOSIS — C3431 Malignant neoplasm of lower lobe, right bronchus or lung: Secondary | ICD-10-CM | POA: Diagnosis not present

## 2023-07-04 NOTE — Progress Notes (Signed)
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 Bronchoscopy with biopsy at that time revealed atypical cells. She was deemed a poor surgical candidate due to multiple comorbidities including severe oxygen dependent COPD. She completed stereotactic radiation therapy to both lesions in late February 2022. Follow up imaging and office visit in July of 2022 with Dr. Roselind Messier revealed her to be stable with no suspicious areas and expected radiation changes. CT in January of 2023 revealed post treatment changes with no new or progressive findings. In August she had weight loss, fatigue and weakness, and CT 05/09/22 revealed continued evolution of post-treatment changes in the right lower lobe and left upper lobe. CT scan done on October 29, 2022 which reveals postradiation changes but no evidence of recurrent or metastatic disease.  She has a small pleural effusion.  She has a worsening compression of the T8 vertebral body.  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  She was started on Prolia injections on August 06, 2022 and tolerated this without any difficulty.  She is on calcium supplement 1200 mg daily and her calcium level is normal.  She will be due again at  the end of November or early December along with labs.  Cardiac arrhythmias She has coronary artery disease and multiple cardiac issues including atherosclerosis and arrhythmias.  She was recently hospitalized in the ICU at [facility] for 2 weeks.  Plan She has a worsening compression deformity at the T8 vertebral body.  She has known osteoporosis with a bone density scan done July 12, 2022.  The T-score of the femur was -3.0 and the T-score of the forearm was even worse.  She received her first injection of Prolia at the end of November and tolerated it without any difficulty.  She informed me that she has been having problems with her heart and was hospitalized for 2 weeks recently at [facility], ICU for cardiac arrhythmias.  She will follow-up with Dr. Dulce Sellar next week.  An echocardiogram revealed  left ventricular ejection fraction of 60 to 65%, GLS-19.4%. Left ventricular diastolic parameters are consistent with Grade I diastolic dysfunction (impaired relaxation). Her right atrial size was severely dilated. Her tricuspid valve regurgitation is moderate and aortic valve regurgitation is mild, no aortic stenosis is present.  A CT chest was done on April 09, 2023 and reveals a stable band of volume loss in the right lower lobe as well as the left upper lobe consistent with her prior radiation.  There is no findings of active or progressive malignancy.  She does have progressive sclerosis and collapse of the T8 vertebral body now with 60% loss of the vertebral body height.  She also has evidence of aortic, coronary, and systemic atherosclerosis.  I will see her back in 3 to 4 months with CBC, CEA, CMP,  bilateral mammogram, and CT of the chest.  She will be due for labs and Prolia injection in late November or early December. she understands and agrees with this plan of care and knows to call if she has further concerns regarding her lung cancers.  I provided 25 minutes of time during this this encounter  and > 50% was spent counseling as documented under my assessment and plan.   [facility] AT [facility] [address] [place] [address] Dept: [phone] Dept Fax: [phone]   No orders of the defined types were placed in this encounter.   CHIEF COMPLAINT:  CC: A 77 year old female with bilateral non-small cell lung cancers  Current Treatment:  Surveillance  INTERVAL HISTORY:  Angela Phillips is here today for repeat clinical assessment for her bilateral non-small cell lung cancers.  These were treated with stereotactic radiation successfully.  She had a new compression deformity at the T8 vertebral body.  She has known osteoporosis with a bone density scan done July 12, 2022.  The T-score of the femur was -3.0 and the T-score of the forearm was even worse.  She received her first injection of Prolia at the end of November and tolerated it without any difficulty. She went to the cardiologist and had cardiac telemetry monitoring done on 01/28/2023. The monitor showed underlying normal rhythm with a single 11 beat run of SVT and rare premature beats. Patient had an echocardiogram done on 02/19/2023 that revealed  left ventricular ejection fraction of 60 to 65%, GLS-19.4%. Left ventricular diastolic parameters are consistent with Grade I diastolic dysfunction (impaired relaxation). Right ventricular systolic function is normal and the right ventricular size is severely enlarged, there is normal pulmonary artery systolic pressure. Her right atrial size was severely dilated. Her tricuspid valve regurgitation is moderate and aortic valve regurgitation is mild, no aortic stenosis is present.  Since then she was hospitalized for 2 weeks in the ICU for cardiac issues at [facility] and will follow-up with Dr. Dulce Sellar next week.  She also has a follow-up appointment with her cardiologist scheduled in early December.  A CT chest was done on April 09, 2023 and  reveals a stable band of volume loss in the right lower lobe as well as the left upper lobe consistent with her prior radiation.  There is no findings of active or progressive malignancy.  She does have progressive sclerosis and collapse of the T8 vertebral body now with 60% loss of the vertebral body height.  She also has evidence of aortic, coronary, and systemic atherosclerosis.  I will see her back in 3 to 4 months with CBC, CEA, CMP, bilateral mammogram, and CT of the chest.  She will be due for labs and Prolia injection in late November or early December.  She denies signs of infection such as sore throat, sinus drainage, or urinary symptoms.  She denies fevers. She denies pain. She denies nausea and vomiting. Her appetite is not good and her weight decreased by 1 pound since her last visit.   I have reviewed the past medical history, past surgical history, social history and family history with the patient and they are unchanged from previous note.  ALLERGIES:  is allergic to beta adrenergic blockers, aspirin, atorvastatin, buspirone, celecoxib, chlorpheniramine, citalopram, diltiazem, ezetimibe, ezetimibe-simvastatin, hydrocodone, hydrocodone-acetaminophen, other, prochlorperazine, prochlorperazine edisylate, prochlorperazine maleate, rosuvastatin, statins, venlafaxine, and verapamil.  MEDICATIONS:  Current Outpatient Medications  Medication Sig Dispense Refill   budesonide-formoterol (SYMBICORT) 160-4.5 MCG/ACT  inhaler Inhale 2 puffs into the lungs daily.     celecoxib (CELEBREX) 200 MG capsule Take 200 mg by mouth daily.     levalbuterol (XOPENEX HFA) 45 MCG/ACT inhaler Inhale 2 puffs into the lungs as needed for wheezing or shortness of breath.     LORazepam (ATIVAN) 0.5 MG tablet Take 0.5 mg by mouth as needed for anxiety.     albuterol (PROVENTIL) (2.5 MG/3ML) 0.083% nebulizer solution Take 2.5 mg by nebulization every 6 (six) hours as needed for wheezing or shortness of breath.      albuterol (VENTOLIN HFA) 108 (90 Base) MCG/ACT inhaler Inhale 2 puffs into the lungs every 6 (six) hours as needed for wheezing or shortness of breath.     arformoterol (BROVANA) 15 MCG/2ML NEBU Take 2 mLs (15 mcg total) by nebulization 2 (two) times daily. 120 mL 0   aspirin 81 MG chewable tablet Chew 1 tablet (81 mg total) by mouth daily. 30 tablet 0   clonazePAM (KLONOPIN) 1 MG tablet Take 0.5 tablets (0.5 mg total) by mouth every 6 (six) hours as needed for anxiety.     FLUoxetine (PROZAC) 20 MG capsule Take 20 mg by mouth 2 (two) times daily.     fluticasone (FLONASE) 50 MCG/ACT nasal spray Place 1 spray into both nostrils 2 (two) times daily as needed for allergies or rhinitis. 16 g 0   furosemide (LASIX) 20 MG tablet Take 1 tablet (20 mg total) by mouth daily. 90 tablet 3   gabapentin (NEURONTIN) 300 MG capsule Take 300 mg by mouth at bedtime.     losartan-hydrochlorothiazide (HYZAAR) 50-12.5 MG tablet Take 1 tablet by mouth daily.     Multiple Vitamin (MULTI-VITAMINS) TABS Take 1 tablet by mouth daily. Centrum silver     omeprazole (PRILOSEC) 40 MG capsule Take 40 mg by mouth daily.     pravastatin (PRAVACHOL) 80 MG tablet Take 1 tablet (80 mg total) by mouth daily at 6 PM. 30 tablet 0   Tiotropium Bromide Monohydrate (SPIRIVA RESPIMAT) 1.25 MCG/ACT AERS Inhale 2 Inhalations into the lungs daily. 4 g 0   No current facility-administered medications for this visit.    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)   10/11/2020 Cancer Staging   Staging form: Lung, AJCC 8th Edition - Clinical stage from 10/11/2020: Stage IA2 (cT1b, cN0, cM0) - Signed by Dellia Beckwith, MD on 06/10/2021 Histopathologic type: Adenocarcinoma, NOS Stage prefix: Initial diagnosis Histologic grade (G): GX Histologic grading system: 4 grade system Laterality: Left Tumor size (mm):  16 Lymph-vascular invasion (LVI): Presence of LVI unknown/indeterminate Diagnostic confirmation: Radiography and other imaging techniques without microscopic confirmation Staged by: Managing physician Stage used in treatment planning: Yes National guidelines used in treatment planning: Yes Type of national guideline used in treatment planning: NCCN Staging comments: SBRT   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)  10/11/2020 Initial Diagnosis   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)   10/11/2020 Cancer Staging   Staging form: Lung, AJCC 8th Edition - Clinical stage from 10/11/2020: Stage IA3 (cT1c, cN0, cM0) - Signed by Dellia Beckwith, MD on 06/10/2021 Stage prefix: Initial diagnosis Histologic grade (G): GX Histologic grading system: 4 grade system Laterality: Right Tumor size (mm): 22 Lymph-vascular invasion (LVI): Presence of LVI unknown/indeterminate Diagnostic confirmation: Radiography and other imaging techniques without microscopic confirmation Staged  by: Managing physician Stage used in treatment planning: Yes National guidelines used in treatment planning: Yes Type of national guideline used in treatment planning: NCCN       REVIEW OF SYSTEMS:  Review of Systems  Constitutional: Negative.  Negative for appetite change, chills, diaphoresis, fatigue, fever and unexpected weight change.  HENT:  Negative.  Negative for hearing loss, lump/mass, mouth sores, nosebleeds, sore throat, tinnitus, trouble swallowing and voice change.   Eyes: Negative.  Negative for eye problems and icterus.  Respiratory:  Positive for cough (with clear/white phlegm). Negative for chest tightness, hemoptysis, shortness of breath and wheezing.   Cardiovascular:  Positive for chest pain (right side). Negative for leg swelling and palpitations.  Gastrointestinal: Negative.  Negative for abdominal distention, abdominal pain, blood in stool, constipation, diarrhea, nausea, rectal pain and vomiting.   Endocrine: Negative.   Genitourinary: Negative.  Negative for bladder incontinence, difficulty urinating, dyspareunia, dysuria, frequency, hematuria, menstrual problem, nocturia, pelvic pain, vaginal bleeding and vaginal discharge.   Musculoskeletal: Negative.  Negative for arthralgias, back pain, flank pain, gait problem, myalgias, neck pain and neck stiffness.  Skin: Negative.  Negative for itching, rash and wound.  Neurological:  Negative for extremity weakness, gait problem, numbness, seizures and speech difficulty.       Syncopal episode  Hematological: Negative.  Negative for adenopathy. Does not bruise/bleed easily.  Psychiatric/Behavioral: Negative.  Negative for confusion, decreased concentration, depression, sleep disturbance and suicidal ideas. The patient is not nervous/anxious.    VITALS:  Blood pressure 135/68, pulse 76, temperature 97.6 F (36.4 C), temperature source Oral, resp. rate 18, height 5\' 1"  (1.549 m), weight 112 lb 1.6 oz (50.8 kg), SpO2 97%.  Wt Readings from Last 3 Encounters:  07/10/23 111 lb 3.2 oz (50.4 kg)  07/04/23 112 lb 1.6 oz (50.8 kg)  05/14/23 113 lb 5.1 oz (51.4 kg)    Body mass index is 21.18 kg/m.  Performance status (ECOG): 1 - Symptomatic but completely ambulatory  PHYSICAL EXAM:  Physical Exam Vitals and nursing note reviewed.  Constitutional:      General: She is not in acute distress.    Appearance: Normal appearance. She is normal weight. She is not ill-appearing, toxic-appearing or diaphoretic.  HENT:     Head: Normocephalic and atraumatic.     Right Ear: Tympanic membrane, ear canal and external ear normal. There is no impacted cerumen.     Left Ear: Tympanic membrane, ear canal and external ear normal. There is no impacted cerumen.     Nose: Nose normal. No congestion or rhinorrhea.     Mouth/Throat:     Mouth: Mucous membranes are moist.     Pharynx: Oropharynx is clear. No oropharyngeal exudate or posterior oropharyngeal  erythema.  Eyes:     General: No scleral icterus.       Left eye: No discharge.     Extraocular Movements: Extraocular movements intact.     Conjunctiva/sclera: Conjunctivae normal.     Pupils: Pupils are equal, round, and reactive to light.  Neck:     Vascular: No carotid bruit.  Cardiovascular:     Rate and Rhythm: Normal rate and regular rhythm.     Pulses: Normal pulses.     Heart sounds: Normal heart sounds. No murmur heard.    No friction rub. No gallop.  Pulmonary:     Effort: Pulmonary effort is normal. No respiratory distress.     Breath sounds: No stridor. Wheezing present. No rhonchi or rales.  Comments: Diffuse expiratory wheezing in all lung fields Chest:     Chest wall: No tenderness.  Abdominal:     General: Bowel sounds are normal. There is no distension.     Palpations: Abdomen is soft. There is no hepatomegaly, splenomegaly or mass.     Tenderness: There is no abdominal tenderness. There is no right CVA tenderness, left CVA tenderness, guarding or rebound.     Hernia: No hernia is present.  Musculoskeletal:        General: No swelling, tenderness, deformity or signs of injury. Normal range of motion.     Cervical back: Normal range of motion and neck supple. No rigidity or tenderness.     Right lower leg: No edema.     Left lower leg: No edema.  Lymphadenopathy:     Cervical: No cervical adenopathy.     Right cervical: No superficial, deep or posterior cervical adenopathy.    Left cervical: No superficial, deep or posterior cervical adenopathy.     Upper Body:     Right upper body: No supraclavicular, axillary or pectoral adenopathy.     Left upper body: No supraclavicular, axillary or pectoral adenopathy.  Skin:    General: Skin is warm and dry.     Coloration: Skin is not jaundiced or pale.     Findings: No bruising, erythema, lesion or rash.  Neurological:     General: No focal deficit present.     Mental Status: She is alert and oriented to person,  place, and time. Mental status is at baseline.     Cranial Nerves: No cranial nerve deficit.     Sensory: No sensory deficit.     Motor: No weakness.     Coordination: Coordination normal.     Gait: Gait normal.     Deep Tendon Reflexes: Reflexes normal.  Psychiatric:        Mood and Affect: Mood normal.        Behavior: Behavior normal.        Thought Content: Thought content normal.        Judgment: Judgment normal.   LABORATORY DATA:  I have reviewed the data as listed  Component 10/29/2022  CEA 3.1      Component Value Date/Time   NA 134 (L) 05/14/2023 0330   NA 142 04/30/2023 1349   K 4.4 05/14/2023 0330   CL 92 (L) 05/14/2023 0330   CO2 26 05/14/2023 0330   GLUCOSE 111 (H) 05/14/2023 0330   BUN 15 05/14/2023 0330   BUN 14 04/30/2023 1349   CREATININE 0.87 05/14/2023 0330   CREATININE 0.89 03/28/2023 1454   CALCIUM 9.0 05/14/2023 0330   PROT 6.1 (L) 05/14/2023 0330   PROT 6.3 08/02/2020 1233   ALBUMIN 2.7 (L) 05/14/2023 0330   ALBUMIN 4.1 08/02/2020 1233   AST 22 05/14/2023 0330   AST 25 03/28/2023 1454   ALT 104 (H) 05/14/2023 0330   ALT 28 03/28/2023 1454   ALKPHOS 71 05/14/2023 0330   BILITOT 1.0 05/14/2023 0330   BILITOT 0.4 03/28/2023 1454   GFRNONAA >60 05/14/2023 0330   GFRNONAA >60 03/28/2023 1454   GFRAA 93 08/02/2020 1233   No results found for: "SPEP", "UPEP"  Lab Results  Component Value Date   WBC 12.8 (H) 05/14/2023   NEUTROABS 5.2 05/06/2023   HGB 11.7 (L) 05/14/2023   HCT 36.7 05/14/2023   MCV 87.4 05/14/2023   PLT 279 05/14/2023     Chemistry  Component Value Date/Time   NA 134 (L) 05/14/2023 0330   NA 142 04/30/2023 1349   K 4.4 05/14/2023 0330   CL 92 (L) 05/14/2023 0330   CO2 26 05/14/2023 0330   BUN 15 05/14/2023 0330   BUN 14 04/30/2023 1349   CREATININE 0.87 05/14/2023 0330   CREATININE 0.89 03/28/2023 1454   GLU 86 10/29/2022 0000      Component Value Date/Time   CALCIUM 9.0 05/14/2023 0330   ALKPHOS 71  05/14/2023 0330   AST 22 05/14/2023 0330   AST 25 03/28/2023 1454   ALT 104 (H) 05/14/2023 0330   ALT 28 03/28/2023 1454   BILITOT 1.0 05/14/2023 0330   BILITOT 0.4 03/28/2023 1454         Component Ref Range & Units 2 mo ago (01/10/23) 2 mo ago (01/10/23)  Troponin I (High Sensitivity) <18 ng/L 20 High  21 High  CM     Component Ref Range & Units 2 mo ago  Magnesium 1.7 - 2.4 mg/dL 1.9    RADIOGRAPHIC STUDIES: I have personally reviewed the radiological images as listed and agreed with the findings in the report.       I,Jasmine M Lassiter,acting as a scribe for Dellia Beckwith, MD.,have documented all relevant documentation on the behalf of Dellia Beckwith, MD,as directed by  Dellia Beckwith, MD while in the presence of Dellia Beckwith, MD.

## 2023-07-09 ENCOUNTER — Telehealth: Payer: Self-pay | Admitting: Oncology

## 2023-07-09 NOTE — Progress Notes (Unsigned)
Cardiology Office Note:    Date:  07/10/2023   ID:  Angela Phillips, DOB [birth year], MRN [id number]  PCP:  Hurshel Party, NP  Cardiologist:  Norman Herrlich, MD    Referring MD: Hurshel Party, NP    ASSESSMENT:    1. Cor pulmonale, chronic (HCC)   2. PAH (pulmonary artery hypertension) (HCC)   3. Nonrheumatic tricuspid valve regurgitation   4. Coronary artery disease of native artery of native heart with stable angina pectoris (HCC)   5. Chronic obstructive pulmonary disease, unspecified COPD type (HCC)   6. Chronic hypoxic respiratory failure (HCC)   7. Hypertensive heart disease, unspecified whether heart failure present   8. Mixed hyperlipidemia    PLAN:    In order of problems listed above:  Heart failure is well compensated she will continue her current loop diuretic I am somewhat surprised she does not wear oxygen continuously before she leaves the office will check her oxygen saturation. 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  Concerns regarding medicines are outlined above.  No orders of the defined types were placed in this encounter.  No orders of the defined types were placed in this encounter.    History of Present Illness:    Angela Phillips is a 77 y.o. female with a hx of oxygen dependent COPD hypertension dyslipidemia and coronary artery calcification on CT scan she had an echocardiogram at [facility] August of this year showing normal left ventricular size wall thickness systolic function EF 55 to 60% normal right ventricular size and function and moderate tricuspid regurgitation.  Last seen 04/30/2023 with my partner Dr. Tomie China.  She had coronary  angiography [facility] 05/08/2023 with three-vessel coronary artery disease elevated left ventricular end-diastolic filling pressure moderate pulmonary artery hypertension low cardiac output physiology consistent with cardiogenic shock and RV failure.  She was seen by advanced heart failure Dr. Gala Romney with bradycardia precipitating heart failure and cardiogenic shock.  She was not advised revascularization during the hospitalization and felt that she had severe end-stage cor pulmonale with pulmonary artery hypertension and severe tricuspid regurgitation with a recommendation to avoid any AV blocking agents in the future.  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 Ladona Ridgel 2020  Compliance with diet, lifestyle and medications: Yes  In some ways she is better she has no edema blood pressure is good she is using her oxygen as needed, little surprised I expect her to be on continuous oxygen therapy She is having frequent episodes of very rapid heart rhythm makes her feel profoundly weak frightened and I suspect she is having recurrent SVT and if we capture tachyarrhythmias we need to refer back to EP for further ablation or antiarrhythmic drug therapy with a backup pacemaker. 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 Exam Location:  Inpatient  Procedure: Cardiac Doppler, Limited Echo and Limited Color Doppler  Indications:    Chest Pain R07.9  History:        Patient has prior history of Echocardiogram examinations, most recent 02/19/2023. Cancer and COPD,  Arrythmias:Bradycardia, Signs/Symptoms:Hypotension and Chest Pain; Risk Factors:Hypertension, Former Smoker and Dyslipidemia.  Sonographer:    Aron Baba Referring Phys: [id number] PAULA V ROSS   Sonographer Comments: Technically difficult study due to poor echo windows. IMPRESSIONS   1. Techincally diffculty study with limited views. Left ventricular ejection fraction, by estimation, is 55 to 60%. The left ventricle has normal function. Left ventricular endocardial border not optimally defined to evaluate regional wall motion. 2. Right ventricular systolic function is normal. The right ventricular size is moderately enlarged. 3. Tricuspid valve regurgitation is moderate.  FINDINGS Left Ventricle: Left ventricular ejection fraction, by estimation, is 55 to 60%. The left ventricle has normal function. Left ventricular endocardial border not optimally defined to evaluate regional wall motion. The left ventricular internal cavity size was small. There is no left ventricular hypertrophy.  Right Ventricle: The right ventricular size is moderately enlarged. Right ventricular systolic function is normal.  Pericardium: There is no evidence of pericardial effusion.  Mitral Valve: The mitral valve is normal in structure.  Tricuspid Valve: The tricuspid valve is normal in structure. Tricuspid valve regurgitation is moderate.  Additional Comments: Spectral Doppler performed. Color Doppler performed.  LEFT VENTRICLE PLAX 2D LVIDd:         2.80 cm LVIDs:         1.40 cm LV PW:         0.70 cm LV IVS:        0.40 cm   LEFT ATRIUM         Index LA diam:    2.70 cm 2.01 cm/m  Epifanio Lesches MD Electronically signed by Epifanio Lesches MD Signature Date/Time: 05/07/2023/4:29:22 PM    Final    MONITORS  LONG TERM MONITOR-LIVE TELEMETRY (3-14 DAYS) 02/13/2023  Narrative   The dominant rhythm is normal sinus rhythm with normal circadian variation.   There are rare premature  atrial and premature ventricular beats.  There is a single 11-beat run of nonsustained supraventricular tachycardia, most likely ectopic atrial tachycardia.   Premature ventricular contractions there is no evidence of complex ventricular arrhythmia.   There is no evidence of significant bradycardia or pauses.   There are rare blocked premature atrial contractions, followed by junctional escape beats.   Several symptom-triggered recordings are submitted, but these show normal sinus rhythm.  Essentially normal arrhythmia monitor. There is no correlation between patient's symptoms and any rhythm abnormalities.    Patch Wear Time:  Cardiology Office Note:    Date:  07/10/2023   ID:  Angela Phillips, DOB [birth year], MRN [id number]  PCP:  Hurshel Party, NP  Cardiologist:  Norman Herrlich, MD    Referring MD: Hurshel Party, NP    ASSESSMENT:    1. Cor pulmonale, chronic (HCC)   2. PAH (pulmonary artery hypertension) (HCC)   3. Nonrheumatic tricuspid valve regurgitation   4. Coronary artery disease of native artery of native heart with stable angina pectoris (HCC)   5. Chronic obstructive pulmonary disease, unspecified COPD type (HCC)   6. Chronic hypoxic respiratory failure (HCC)   7. Hypertensive heart disease, unspecified whether heart failure present   8. Mixed hyperlipidemia    PLAN:    In order of problems listed above:  Heart failure is well compensated she will continue her current loop diuretic I am somewhat surprised she does not wear oxygen continuously before she leaves the office will check her oxygen saturation. 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  Concerns regarding medicines are outlined above.  No orders of the defined types were placed in this encounter.  No orders of the defined types were placed in this encounter.    History of Present Illness:    Angela Phillips is a 77 y.o. female with a hx of oxygen dependent COPD hypertension dyslipidemia and coronary artery calcification on CT scan she had an echocardiogram at [facility] August of this year showing normal left ventricular size wall thickness systolic function EF 55 to 60% normal right ventricular size and function and moderate tricuspid regurgitation.  Last seen 04/30/2023 with my partner Dr. Tomie China.  She had coronary  angiography [facility] 05/08/2023 with three-vessel coronary artery disease elevated left ventricular end-diastolic filling pressure moderate pulmonary artery hypertension low cardiac output physiology consistent with cardiogenic shock and RV failure.  She was seen by advanced heart failure Dr. Gala Romney with bradycardia precipitating heart failure and cardiogenic shock.  She was not advised revascularization during the hospitalization and felt that she had severe end-stage cor pulmonale with pulmonary artery hypertension and severe tricuspid regurgitation with a recommendation to avoid any AV blocking agents in the future.  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 Ladona Ridgel 2020  Compliance with diet, lifestyle and medications: Yes  In some ways she is better she has no edema blood pressure is good she is using her oxygen as needed, little surprised I expect her to be on continuous oxygen therapy She is having frequent episodes of very rapid heart rhythm makes her feel profoundly weak frightened and I suspect she is having recurrent SVT and if we capture tachyarrhythmias we need to refer back to EP for further ablation or antiarrhythmic drug therapy with a backup pacemaker. 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 Exam Location:  Inpatient  Procedure: Cardiac Doppler, Limited Echo and Limited Color Doppler  Indications:    Chest Pain R07.9  History:        Patient has prior history of Echocardiogram examinations, most recent 02/19/2023. Cancer and COPD,  Arrythmias:Bradycardia, Signs/Symptoms:Hypotension and Chest Pain; Risk Factors:Hypertension, Former Smoker and Dyslipidemia.  Sonographer:    Aron Baba Referring Phys: [id number] PAULA V ROSS   Sonographer Comments: Technically difficult study due to poor echo windows. IMPRESSIONS   1. Techincally diffculty study with limited views. Left ventricular ejection fraction, by estimation, is 55 to 60%. The left ventricle has normal function. Left ventricular endocardial border not optimally defined to evaluate regional wall motion. 2. Right ventricular systolic function is normal. The right ventricular size is moderately enlarged. 3. Tricuspid valve regurgitation is moderate.  FINDINGS Left Ventricle: Left ventricular ejection fraction, by estimation, is 55 to 60%. The left ventricle has normal function. Left ventricular endocardial border not optimally defined to evaluate regional wall motion. The left ventricular internal cavity size was small. There is no left ventricular hypertrophy.  Right Ventricle: The right ventricular size is moderately enlarged. Right ventricular systolic function is normal.  Pericardium: There is no evidence of pericardial effusion.  Mitral Valve: The mitral valve is normal in structure.  Tricuspid Valve: The tricuspid valve is normal in structure. Tricuspid valve regurgitation is moderate.  Additional Comments: Spectral Doppler performed. Color Doppler performed.  LEFT VENTRICLE PLAX 2D LVIDd:         2.80 cm LVIDs:         1.40 cm LV PW:         0.70 cm LV IVS:        0.40 cm   LEFT ATRIUM         Index LA diam:    2.70 cm 2.01 cm/m  Epifanio Lesches MD Electronically signed by Epifanio Lesches MD Signature Date/Time: 05/07/2023/4:29:22 PM    Final    MONITORS  LONG TERM MONITOR-LIVE TELEMETRY (3-14 DAYS) 02/13/2023  Narrative   The dominant rhythm is normal sinus rhythm with normal circadian variation.   There are rare premature  atrial and premature ventricular beats.  There is a single 11-beat run of nonsustained supraventricular tachycardia, most likely ectopic atrial tachycardia.   Premature ventricular contractions there is no evidence of complex ventricular arrhythmia.   There is no evidence of significant bradycardia or pauses.   There are rare blocked premature atrial contractions, followed by junctional escape beats.   Several symptom-triggered recordings are submitted, but these show normal sinus rhythm.  Essentially normal arrhythmia monitor. There is no correlation between patient's symptoms and any rhythm abnormalities.    Patch Wear Time:

## 2023-07-09 NOTE — Telephone Encounter (Signed)
Contacted pt to schedule an appt. Unable to reach via phone, voicemail was left.   Scheduling Message Entered by Gery Pray H on 07/04/2023 at  4:05 PM Priority: Routine <No visit type provided>  Department: [facility]  Provider:  Scheduling Notes:  RT in 3-4 months with labs, CT chest and bilat mammogram before appt

## 2023-07-10 ENCOUNTER — Encounter: Payer: Self-pay | Admitting: Cardiology

## 2023-07-10 ENCOUNTER — Ambulatory Visit: Payer: Medicare HMO | Attending: Cardiology

## 2023-07-10 ENCOUNTER — Ambulatory Visit: Payer: Medicare HMO | Attending: Cardiology | Admitting: Cardiology

## 2023-07-10 VITALS — BP 130/68 | HR 72 | Ht 61.0 in | Wt 111.2 lb

## 2023-07-10 DIAGNOSIS — J9611 Chronic respiratory failure with hypoxia: Secondary | ICD-10-CM | POA: Diagnosis not present

## 2023-07-10 DIAGNOSIS — J449 Chronic obstructive pulmonary disease, unspecified: Secondary | ICD-10-CM

## 2023-07-10 DIAGNOSIS — F419 Anxiety disorder, unspecified: Secondary | ICD-10-CM | POA: Insufficient documentation

## 2023-07-10 DIAGNOSIS — M65321 Trigger finger, right index finger: Secondary | ICD-10-CM

## 2023-07-10 DIAGNOSIS — R4589 Other symptoms and signs involving emotional state: Secondary | ICD-10-CM | POA: Insufficient documentation

## 2023-07-10 DIAGNOSIS — I2721 Secondary pulmonary arterial hypertension: Secondary | ICD-10-CM

## 2023-07-10 DIAGNOSIS — I2781 Cor pulmonale (chronic): Secondary | ICD-10-CM

## 2023-07-10 DIAGNOSIS — I25118 Atherosclerotic heart disease of native coronary artery with other forms of angina pectoris: Secondary | ICD-10-CM

## 2023-07-10 DIAGNOSIS — C3490 Malignant neoplasm of unspecified part of unspecified bronchus or lung: Secondary | ICD-10-CM | POA: Insufficient documentation

## 2023-07-10 DIAGNOSIS — S0093XA Contusion of unspecified part of head, initial encounter: Secondary | ICD-10-CM | POA: Insufficient documentation

## 2023-07-10 DIAGNOSIS — I119 Hypertensive heart disease without heart failure: Secondary | ICD-10-CM | POA: Diagnosis not present

## 2023-07-10 DIAGNOSIS — J069 Acute upper respiratory infection, unspecified: Secondary | ICD-10-CM

## 2023-07-10 DIAGNOSIS — J189 Pneumonia, unspecified organism: Secondary | ICD-10-CM | POA: Insufficient documentation

## 2023-07-10 DIAGNOSIS — R898 Other abnormal findings in specimens from other organs, systems and tissues: Secondary | ICD-10-CM

## 2023-07-10 DIAGNOSIS — I361 Nonrheumatic tricuspid (valve) insufficiency: Secondary | ICD-10-CM | POA: Diagnosis not present

## 2023-07-10 DIAGNOSIS — E119 Type 2 diabetes mellitus without complications: Secondary | ICD-10-CM

## 2023-07-10 DIAGNOSIS — Z96651 Presence of right artificial knee joint: Secondary | ICD-10-CM

## 2023-07-10 DIAGNOSIS — K8020 Calculus of gallbladder without cholecystitis without obstruction: Secondary | ICD-10-CM

## 2023-07-10 DIAGNOSIS — K622 Anal prolapse: Secondary | ICD-10-CM

## 2023-07-10 DIAGNOSIS — K644 Residual hemorrhoidal skin tags: Secondary | ICD-10-CM | POA: Insufficient documentation

## 2023-07-10 DIAGNOSIS — E782 Mixed hyperlipidemia: Secondary | ICD-10-CM | POA: Diagnosis not present

## 2023-07-10 DIAGNOSIS — R45851 Suicidal ideations: Secondary | ICD-10-CM

## 2023-07-10 DIAGNOSIS — S2232XA Fracture of one rib, left side, initial encounter for closed fracture: Secondary | ICD-10-CM | POA: Insufficient documentation

## 2023-07-10 DIAGNOSIS — S61419A Laceration without foreign body of unspecified hand, initial encounter: Secondary | ICD-10-CM | POA: Insufficient documentation

## 2023-07-10 DIAGNOSIS — S62609A Fracture of unspecified phalanx of unspecified finger, initial encounter for closed fracture: Secondary | ICD-10-CM

## 2023-07-10 DIAGNOSIS — M009 Pyogenic arthritis, unspecified: Secondary | ICD-10-CM

## 2023-07-10 DIAGNOSIS — M5481 Occipital neuralgia: Secondary | ICD-10-CM | POA: Insufficient documentation

## 2023-07-10 DIAGNOSIS — R062 Wheezing: Secondary | ICD-10-CM

## 2023-07-10 DIAGNOSIS — M71122 Other infective bursitis, left elbow: Secondary | ICD-10-CM

## 2023-07-10 DIAGNOSIS — M25522 Pain in left elbow: Secondary | ICD-10-CM | POA: Insufficient documentation

## 2023-07-10 DIAGNOSIS — J4540 Moderate persistent asthma, uncomplicated: Secondary | ICD-10-CM

## 2023-07-10 DIAGNOSIS — S42402A Unspecified fracture of lower end of left humerus, initial encounter for closed fracture: Secondary | ICD-10-CM

## 2023-07-10 DIAGNOSIS — F32A Depression, unspecified: Secondary | ICD-10-CM

## 2023-07-10 DIAGNOSIS — J329 Chronic sinusitis, unspecified: Secondary | ICD-10-CM

## 2023-07-10 DIAGNOSIS — R918 Other nonspecific abnormal finding of lung field: Secondary | ICD-10-CM | POA: Insufficient documentation

## 2023-07-10 DIAGNOSIS — R9389 Abnormal findings on diagnostic imaging of other specified body structures: Secondary | ICD-10-CM | POA: Insufficient documentation

## 2023-07-10 DIAGNOSIS — I480 Paroxysmal atrial fibrillation: Secondary | ICD-10-CM | POA: Insufficient documentation

## 2023-07-10 DIAGNOSIS — I4891 Unspecified atrial fibrillation: Secondary | ICD-10-CM | POA: Insufficient documentation

## 2023-07-10 DIAGNOSIS — K219 Gastro-esophageal reflux disease without esophagitis: Secondary | ICD-10-CM | POA: Insufficient documentation

## 2023-07-10 DIAGNOSIS — K810 Acute cholecystitis: Secondary | ICD-10-CM | POA: Insufficient documentation

## 2023-07-10 DIAGNOSIS — S51012A Laceration without foreign body of left elbow, initial encounter: Secondary | ICD-10-CM | POA: Insufficient documentation

## 2023-07-10 DIAGNOSIS — S72002A Fracture of unspecified part of neck of left femur, initial encounter for closed fracture: Secondary | ICD-10-CM

## 2023-07-10 HISTORY — DX: Acute upper respiratory infection, unspecified: J06.9

## 2023-07-10 HISTORY — DX: Residual hemorrhoidal skin tags: K64.4

## 2023-07-10 HISTORY — DX: Fracture of unspecified phalanx of unspecified finger, initial encounter for closed fracture: S62.609A

## 2023-07-10 HISTORY — DX: Other abnormal findings in specimens from other organs, systems and tissues: R89.8

## 2023-07-10 HISTORY — DX: Abnormal findings on diagnostic imaging of other specified body structures: R93.89

## 2023-07-10 HISTORY — DX: Pyogenic arthritis, unspecified: M00.9

## 2023-07-10 HISTORY — DX: Chronic sinusitis, unspecified: J32.9

## 2023-07-10 HISTORY — DX: Presence of right artificial knee joint: Z96.651

## 2023-07-10 HISTORY — DX: Type 2 diabetes mellitus without complications: E11.9

## 2023-07-10 HISTORY — DX: Unspecified atrial fibrillation: I48.91

## 2023-07-10 HISTORY — DX: Unspecified fracture of lower end of left humerus, initial encounter for closed fracture: S42.402A

## 2023-07-10 HISTORY — DX: Wheezing: R06.2

## 2023-07-10 HISTORY — DX: Other nonspecific abnormal finding of lung field: R91.8

## 2023-07-10 HISTORY — DX: Suicidal ideations: R45.851

## 2023-07-10 HISTORY — DX: Paroxysmal atrial fibrillation: I48.0

## 2023-07-10 HISTORY — DX: Anal prolapse: K62.2

## 2023-07-10 HISTORY — DX: Other infective bursitis, left elbow: M71.122

## 2023-07-10 HISTORY — DX: Other symptoms and signs involving emotional state: R45.89

## 2023-07-10 HISTORY — DX: Fracture of unspecified part of neck of left femur, initial encounter for closed fracture: S72.002A

## 2023-07-10 HISTORY — DX: Gastro-esophageal reflux disease without esophagitis: K21.9

## 2023-07-10 HISTORY — DX: Malignant neoplasm of unspecified part of unspecified bronchus or lung: C34.90

## 2023-07-10 HISTORY — DX: Occipital neuralgia: M54.81

## 2023-07-10 HISTORY — DX: Contusion of unspecified part of head, initial encounter: S00.93XA

## 2023-07-10 HISTORY — DX: Pneumonia, unspecified organism: J18.9

## 2023-07-10 HISTORY — DX: Anxiety disorder, unspecified: F41.9

## 2023-07-10 HISTORY — DX: Fracture of one rib, left side, initial encounter for closed fracture: S22.32XA

## 2023-07-10 HISTORY — DX: Acute cholecystitis: K81.0

## 2023-07-10 HISTORY — DX: Depression, unspecified: F32.A

## 2023-07-10 HISTORY — DX: Calculus of gallbladder without cholecystitis without obstruction: K80.20

## 2023-07-10 HISTORY — DX: Laceration without foreign body of left elbow, initial encounter: S51.012A

## 2023-07-10 HISTORY — DX: Trigger finger, right index finger: M65.321

## 2023-07-10 HISTORY — DX: Laceration without foreign body of unspecified hand, initial encounter: S61.419A

## 2023-07-10 HISTORY — DX: Pain in left elbow: M25.522

## 2023-07-10 HISTORY — DX: Moderate persistent asthma, uncomplicated: J45.40

## 2023-07-10 NOTE — Addendum Note (Signed)
Addended by: Roxanne Mins I on: 07/10/2023 11:19 AM   Modules accepted: Orders

## 2023-07-10 NOTE — Patient Instructions (Signed)
Medication Instructions:  Your physician recommends that you continue on your current medications as directed. Please refer to the Current Medication list given to you today.  *If you need a refill on your cardiac medications before your next appointment, please call your pharmacy*   Lab Work: None If you have labs (blood work) drawn today and your tests are completely normal, you will receive your results only by: MyChart Message (if you have MyChart) OR A paper copy in the mail If you have any lab test that is abnormal or we need to change your treatment, we will call you to review the results.   Testing/Procedures: A zio monitor was ordered today. It will remain on for 14 days. You will then return monitor and event diary in provided box. It takes 1-2 weeks for report to be downloaded and returned to [place]. We will call you with the results. If monitor falls off or has orange flashing light, please call Zio for further instructions.     Follow-Up: At [facility], you and your health needs are our priority.  As part of our continuing mission to provide you with exceptional heart care, we have created designated Provider Care Teams.  These Care Teams include your primary Cardiologist (physician) and Advanced Practice Providers (APPs -  Physician Assistants and Nurse Practitioners) who all work together to provide you with the care you need, when you need it.  We recommend signing up for the patient portal called "MyChart".  Sign up information is provided on this After Visit Summary.  MyChart is used to connect with patients for Virtual Visits (Telemedicine).  Patients are able to view lab/test results, encounter notes, upcoming appointments, etc.  Non-urgent messages can be sent to your provider as well.   To learn more about what you can do with MyChart, go to [email or web address].    Your next appointment:   4 week(s)  Provider:   Wallis Bamberg, NP   Other  Instructions None

## 2023-07-14 ENCOUNTER — Telehealth: Payer: Self-pay | Admitting: *Deleted

## 2023-07-14 NOTE — Telephone Encounter (Signed)
This is a Sales promotion account executive to inform you the patient's Zio AT device is not making good contact with their skin and troubleshooting was not successful after only 3 days of wear time.  A replacement has been shipped to the patient and a no charge is in place so the patient is not being billed.

## 2023-07-15 DIAGNOSIS — J45909 Unspecified asthma, uncomplicated: Secondary | ICD-10-CM | POA: Diagnosis not present

## 2023-07-15 DIAGNOSIS — I4510 Unspecified right bundle-branch block: Secondary | ICD-10-CM | POA: Diagnosis not present

## 2023-07-15 DIAGNOSIS — I498 Other specified cardiac arrhythmias: Secondary | ICD-10-CM | POA: Diagnosis not present

## 2023-07-15 DIAGNOSIS — R002 Palpitations: Secondary | ICD-10-CM | POA: Diagnosis not present

## 2023-07-15 DIAGNOSIS — I10 Essential (primary) hypertension: Secondary | ICD-10-CM | POA: Diagnosis not present

## 2023-07-15 DIAGNOSIS — R9431 Abnormal electrocardiogram [ECG] [EKG]: Secondary | ICD-10-CM | POA: Diagnosis not present

## 2023-07-16 ENCOUNTER — Telehealth: Payer: Self-pay | Admitting: Cardiology

## 2023-07-16 NOTE — Telephone Encounter (Signed)
Pt calling back regarding monitor, requesting cb

## 2023-07-16 NOTE — Telephone Encounter (Signed)
Called patient and confirmed with her that she should come to the office at 10:30 to have her heart monitor put on. Patient verbalized understanding and had no further questions at this time.

## 2023-07-16 NOTE — Telephone Encounter (Signed)
Patient wants assistance putting on her heart monitor and a call back on when she can come in.

## 2023-07-18 ENCOUNTER — Telehealth: Payer: Self-pay | Admitting: Cardiology

## 2023-07-18 ENCOUNTER — Other Ambulatory Visit: Payer: Self-pay

## 2023-07-18 ENCOUNTER — Telehealth: Payer: Self-pay

## 2023-07-18 MED ORDER — FUROSEMIDE 20 MG PO TABS: 20.0000 mg | ORAL_TABLET | Freq: Every day | ORAL | 3 refills | Status: DC

## 2023-07-18 NOTE — Telephone Encounter (Signed)
  Spoke to Dr. Dulce Sellar and received the following message from Dr. Dulce Sellar below:   "She needs to start a diuretic furosemide 20 mg daily"   Called patient and informed her of Dr. Hulen Shouts recommendation. Patient verbalized understanding and had no further questions at this time. The furosemide medication was ordered via Epic and sent to the patient's pharmacy.

## 2023-07-18 NOTE — Telephone Encounter (Signed)
Received a call from Deana at [facility] and she reported that the patient had a weight gain of 4 lbs. She was not SOB and she has no lower extremity edema. She does feel more tired than usual. Will forward to Dr. Dulce Sellar for guidance.

## 2023-07-18 NOTE — Telephone Encounter (Signed)
Received a call from Deana at [facility] and she reported that the patient had a weight gain of 4 lbs. She was not SOB and she has no lower extremity edema. She does feel more tired than usual.

## 2023-07-18 NOTE — Telephone Encounter (Signed)
Per home health pt has gained 4 lbs in a 24 hour period.

## 2023-07-18 NOTE — Progress Notes (Unsigned)
lasix °

## 2023-07-18 NOTE — Telephone Encounter (Signed)
Received the following message from Dr. Dulce Sellar below:  "She needs to start a diuretic furosemide 20 mg daily"  Called patient and informed her of Dr. Hulen Shouts recommendation. Patient verbalized understanding and had no further questions at this time. The furosemide medication was ordered via Epic and sent to the patient's pharmacy.

## 2023-07-22 DIAGNOSIS — E538 Deficiency of other specified B group vitamins: Secondary | ICD-10-CM | POA: Diagnosis not present

## 2023-07-22 DIAGNOSIS — Z6820 Body mass index (BMI) 20.0-20.9, adult: Secondary | ICD-10-CM | POA: Diagnosis not present

## 2023-07-22 DIAGNOSIS — R5381 Other malaise: Secondary | ICD-10-CM | POA: Diagnosis not present

## 2023-07-22 DIAGNOSIS — R5383 Other fatigue: Secondary | ICD-10-CM | POA: Diagnosis not present

## 2023-07-22 DIAGNOSIS — I5032 Chronic diastolic (congestive) heart failure: Secondary | ICD-10-CM | POA: Diagnosis not present

## 2023-07-22 DIAGNOSIS — I471 Supraventricular tachycardia, unspecified: Secondary | ICD-10-CM | POA: Diagnosis not present

## 2023-07-30 ENCOUNTER — Telehealth: Payer: Self-pay | Admitting: Cardiology

## 2023-07-30 NOTE — Telephone Encounter (Signed)
Left message for Angela Phillips with [facility] to call back.

## 2023-07-30 NOTE — Telephone Encounter (Signed)
New Message       Nurse is at patient's home, need to talk to Dr [facility] nurse please.Marland Kitchen

## 2023-07-31 ENCOUNTER — Other Ambulatory Visit: Payer: Self-pay

## 2023-07-31 NOTE — Telephone Encounter (Signed)
Called patient and informed her regarding Dr. Madireddy's recommendation below:  "Complicated patient with severe three-vessel disease, heart failure with preserved EF, right heart failure recent cardiogenic shock inpatient treatment in September, accelerated junctional rhythm intermittently and unable to use AV nodal blocking agents, with palpitations suspicious for SVT Dr. Dulce Sellar requested event monitor.  With recurrent symptoms as being reported by patient's facility, unclear if her symptoms are due to recurrent episodes of tachyarrhythmias versus worsening anginal symptoms due to RV dysfunction or new ischemia related symptoms.  -Unclear what her current hemodynamics are, blood pressures and heart rates. If hypotensive, will be a poor candidate for calcium channel blocker such as amlodipine or long-acting nitrates for angina. -If this is a daily occurring event, going to the ER to be monitored on telemetry to assess for any arrhythmias and address them accordingly and coordination with electrophysiologist would be the way to go.  [Going to the ER at [facility] would be recommended given her prior care over there]."  Patient verbalized understanding and refused to go to the ER. She stated that she has been to the ER multiple times and she has been evaluated multiple times. I explained that her chest pain/tightness was a progression of her symptoms and that is one of the reasons why the doctor is recommending that she go to the ER. She still stated that she did not want to go to the ER. After much encouragement the patient still refused to go to the ER. I explained that if the pain she was feeling got worse that she could always go to the ER if it got worse. Patient verbalized understanding and had no further questions at this time.

## 2023-07-31 NOTE — Telephone Encounter (Signed)
Called Deanna from [facility] and she was asking if the patient's heart monitor results were back at this time. I explained that we did not have the results yet. She mentioned that the patient is still having episodes where she can feel her heart beating in her neck. Also now she has started having chest pain/tightness with some of her episodes. She is having around 5 episodes per day but the chest pain/tightness does not occur with every episode. She has been to the ER twice with the chest pain/tightness but all they do is watch her and then discharge her per Deanna. Her latest blood pressure is 118/60 HR in the 70's. She was also asking if the patient can be prescribed nitroglycerin to help keep her out of the ER?

## 2023-08-04 DIAGNOSIS — I492 Junctional premature depolarization: Secondary | ICD-10-CM

## 2023-08-04 NOTE — Telephone Encounter (Signed)
Pt is trying to speak with a nurse. Please advise

## 2023-08-05 DIAGNOSIS — N390 Urinary tract infection, site not specified: Secondary | ICD-10-CM | POA: Diagnosis not present

## 2023-08-06 ENCOUNTER — Other Ambulatory Visit: Payer: Self-pay

## 2023-08-10 NOTE — Progress Notes (Unsigned)
Cardiology Office Note:  .   Date:  08/11/2023  ID:  Angela Phillips, DOB 1946-01-31, MRN [id number] PCP: Hurshel Party, NP  [facility] Providers Cardiologist:  Norman Herrlich, MD    History of Present Illness: .   Angela Phillips is a 77 y.o. female with a past medical history of hypertension, SVT, severe TR, pulmonary hypertension, CAD, PAF, lung cancer, COPD oxygen dependent HS, GERD, DM2, rheumatoid arthritis, dyslipidemia, junctional rhythm, bradycardia-avoid any nodal blocking agents in the future.  08/04/2023 monitor average heart rate of 74 bpm, predominant rhythm was sinus, junctional rhythm was present--patient was symptomatic surrounding this, 35 episodes of SVT, SVE's were frequent at 9.2%. 05/09/2023 right left heart cath three-vessel CAD- coronary vessels are very small in caliber. 75% diffuse first diagonal. 50% mid LAD, 80% OM1, 90% mid RCA.  Moderate pulmonary hypertension, severe TR. 05/07/2023 Limited echo EF 55 to 60%, RV moderately enlarged, TR moderate 10/19/2018 SVT ablation  Most recently evaluated by Dr. Dulce Sellar on 07/10/2023, she was having frequent episodes of rapid heart rate, felt to be consistent with recurrent SVT.  A live monitor was arranged, which revealed an average heart rate of 74 bpm, predominant rhythm was sinus, junctional rhythm was present--patient was symptomatic surrounding this, 35 episodes of SVT, SVE's were frequent at 9.2%.   She presents today for follow-up after recent monitor as outlined above.  She states she had " an episode" this morning that is left her extremely fatigued.  She states that her heart was racing for approximately 10 seconds, this occurs frequently for her and leaves her exhausted the remainder of the day.  Her monitor have been reviewed by her cardiologist, as she has a history of junctional rhythm, would like to avoid nodal blocking agents.  She would very much like to see her EP physician again, states she feels she may need a  pacemaker, and that she cannot go on like this as she just does not feel well. She denies chest pain, dyspnea, pnd, orthopnea, n, v, dizziness, syncope, edema, weight gain, or early satiety.   ROS: Review of Systems  Constitutional:  Positive for malaise/fatigue.  Respiratory:  Positive for shortness of breath.   Cardiovascular:  Positive for palpitations.     Studies Reviewed: .        Cardiac Studies & Procedures   CARDIAC CATHETERIZATION  CARDIAC CATHETERIZATION 05/08/2023  Narrative   Prox LAD to Mid LAD lesion is 50% stenosed.   1st Diag lesion is 75% stenosed.   Ost Cx to Prox Cx lesion is 50% stenosed.   1st Mrg lesion is 80% stenosed.   Ost RCA lesion is 50% stenosed.   Prox RCA-1 lesion is 50% stenosed.   Prox RCA-2 lesion is 90% stenosed.   LV end diastolic pressure is moderately elevated.   Hemodynamic findings consistent with moderate pulmonary hypertension.  3 vessel CAD. Coronary vessels are very small in caliber. 75% diffuse first diagonal. 50% mid LAD, 80% OM1, 90% mid RCA Moderately elevated LV filling pressures. PCWP 33/30 with mean 27 mm Hg. LVEDP 27 mm Hg Moderate pulmonary HTN. PAP 54/24 mean 37 mm Hg High RA pressures with large V wave c/w severe TR. RA pressure 42/32 mean 25 mm Hg Low cardiac output. PA sat 48%, cardiac output 2.37 L/min with index 1.67.  Plan: medical management. The RCA is only about 2- 2.25 mm in diameter. The larger issue appears to be cardiogenic shock and RV failure.  Findings Coronary Findings  Diagnostic  Dominance: Right  Left Main Vessel was injected. Vessel is normal in caliber. Vessel is angiographically normal.  Left Anterior Descending Vessel is small. The vessel is moderately calcified. Prox LAD to Mid LAD lesion is 50% stenosed.  First Diagonal Branch Vessel is small in size. 1st Diag lesion is 75% stenosed. The lesion is segmental.  Left Circumflex Ost Cx to Prox Cx lesion is 50% stenosed.  First Obtuse  Marginal Branch 1st Mrg lesion is 80% stenosed.  Right Coronary Artery Vessel is small. Ost RCA lesion is 50% stenosed. Prox RCA-1 lesion is 50% stenosed. Prox RCA-2 lesion is 90% stenosed.  Intervention  No interventions have been documented.   STRESS TESTS  MYOCARDIAL PERFUSION IMAGING 08/11/2020  Narrative  The left ventricular ejection fraction is hyperdynamic (>65%).  This is a low risk study.  The study is normal.  Low risk stress nuclear study with normal perfusion and normal left ventricular regional and global systolic function.   ECHOCARDIOGRAM  ECHOCARDIOGRAM LIMITED 05/07/2023  Narrative ECHOCARDIOGRAM LIMITED REPORT    Patient Name:   Angela Phillips Date of Exam: 05/07/2023 Medical Rec #:  [id number]        Height:       61.0 in Accession #:    [id number]       Weight:       89.3 lb Date of Birth:  03/31/1946         BSA:          1.341 m Patient Age:    77 years         BP:           133/72 mmHg Patient Gender: F                HR:           55 bpm. Exam Location:  Inpatient  Procedure: Cardiac Doppler, Limited Echo and Limited Color Doppler  Indications:    Chest Pain R07.9  History:        Patient has prior history of Echocardiogram examinations, most recent 02/19/2023. Cancer and COPD, Arrythmias:Bradycardia, Signs/Symptoms:Hypotension and Chest Pain; Risk Factors:Hypertension, Former Smoker and Dyslipidemia.  Sonographer:    Aron Baba Referring Phys: [id number] PAULA V ROSS   Sonographer Comments: Technically difficult study due to poor echo windows. IMPRESSIONS   1. Techincally diffculty study with limited views. Left ventricular ejection fraction, by estimation, is 55 to 60%. The left ventricle has normal function. Left ventricular endocardial border not optimally defined to evaluate regional wall motion. 2. Right ventricular systolic function is normal. The right ventricular size is moderately enlarged. 3. Tricuspid valve regurgitation is  moderate.  FINDINGS Left Ventricle: Left ventricular ejection fraction, by estimation, is 55 to 60%. The left ventricle has normal function. Left ventricular endocardial border not optimally defined to evaluate regional wall motion. The left ventricular internal cavity size was small. There is no left ventricular hypertrophy.  Right Ventricle: The right ventricular size is moderately enlarged. Right ventricular systolic function is normal.  Pericardium: There is no evidence of pericardial effusion.  Mitral Valve: The mitral valve is normal in structure.  Tricuspid Valve: The tricuspid valve is normal in structure. Tricuspid valve regurgitation is moderate.  Additional Comments: Spectral Doppler performed. Color Doppler performed.  LEFT VENTRICLE PLAX 2D LVIDd:         2.80 cm LVIDs:         1.40 cm LV PW:         0.70  cm LV IVS:        0.40 cm   LEFT ATRIUM         Index LA diam:    2.70 cm 2.01 cm/m  Epifanio Lesches MD Electronically signed by Epifanio Lesches MD Signature Date/Time: 05/07/2023/4:29:22 PM    Final    MONITORS  LONG TERM MONITOR-LIVE TELEMETRY (3-14 DAYS) 08/04/2023  Narrative Patch Wear Time:  9 days and 21 hours (2024-10-31T11:13:01-399 to 2024-11-14T13:19:42-0500)  Monitor 1 Patient had a min HR of 51 bpm, max HR of 106 bpm, and avg HR of 74 bpm. Predominant underlying rhythm was Sinus Rhythm. Slight P wave morphology changes were noted.  Junctional Rhythm was present. Junctional Rhythm was detected within +/- 45 seconds of symptomatic patient event(s).  Frequent symptomatic events of junctional rhythm some with a very short PR interval others without P waves at an accelerated rate.  Isolated SVEs were rare (<1.0%), SVE Couplets were rare (<1.0%), and SVE Triplets were rare (<1.0%).  Isolated VEs were rare (<1.0%), VE Couplets were rare (<1.0%), and no VE Triplets were present.  Monitor 2 Patient had a min HR of 54 bpm, max HR of 171  bpm, and avg HR of 72 bpm. Predominant underlying rhythm was Sinus Rhythm.  35 Supraventricular Tachycardia runs occurred, the run with the fastest interval lasting 10 beats with a max rate of 171 bpm (avg 148 bpm); the run with the fastest interval was also the longest.  The majority were brief runs of APCs  Isolated SVEs were frequent (9.2%, 54845), SVE Couplets were occasional (2.4%, 7102), and SVE Triplets were rare (<1.0%, 1358).  Isolated VEs were rare (<1.0%, 305), VE Couplets were rare (<1.0%, 18), and VE Triplets were rare (<1.0%, 1).           Risk Assessment/Calculations:             Physical Exam:   VS:  BP 120/80 Comment: home bp reading  Pulse 80   Ht 5\' 1"  (1.549 m)   Wt 115 lb (52.2 kg)   SpO2 91%   BMI 21.73 kg/m    Wt Readings from Last 3 Encounters:  08/11/23 115 lb (52.2 kg)  07/10/23 111 lb 3.2 oz (50.4 kg)  07/04/23 112 lb 1.6 oz (50.8 kg)    GEN: Well nourished, well developed in no acute distress NECK: No JVD; No carotid bruits CARDIAC: RRR, no murmurs, rubs, gallops RESPIRATORY:  Clear to auscultation without rales, wheezing or rhonchi  ABDOMEN: Soft, non-tender, non-distended EXTREMITIES:  No edema; No deformity   ASSESSMENT AND PLAN: .   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 Wears O2 at night. Continue lasix 20 mg daily, continue 50-12.5 mg daily.  Cannot tolerate beta-blocker secondary to junctional rhythm/bradycardia.  Junctional rhythm-most recent monitor revealed episodes of junctional rhythm.  This reportedly initially began after she was started on a calcium channel blocker back in 2019.  Will avoid nodal blocking agents.  CAD-three-vessel CAD per most recent left heart cath, recommendations for medical management.  She is allergic to aspirin.  Continue Pravachol 80 mg daily--she is allergic to atorvastatin and ezetimibe, simvastatin and rosuvastatin.  Stable with no anginal symptoms. No indication for  ischemic evaluation.    SVT-35 episodes on a recent monitor, this appears to be very bothersome for her, states he last for 10 seconds and then just wiper out the rest of the day.  She would very much like to see  her EP MD again, she thinks she needs to have a pacemaker placed.  Discussed with primary cardiologist, we will not start her on any medication for SVT at this time, will refer her to EP.  Will check BMET, magnesium, thyroid.  She did advise she was taken over-the-counter allergy medicine but cannot recall the name, advised her to not take any medications for allergies other than Claritin and Zyrtec.  Hypertension-blood pressure is controlled today at 120/80, continue Hyzaar 50-12.5 mg daily.  Tricuspid regurgitation - some DOE but this is her baseline.  COPD - on O2 HS 2lpm    Dispo: Refer to EP for junctional rhythm/SVT, follow up with Dr. Dulce Sellar in 6 weeks.  Signed, Flossie Dibble, NP

## 2023-08-11 ENCOUNTER — Encounter: Payer: Self-pay | Admitting: Cardiology

## 2023-08-11 ENCOUNTER — Telehealth: Payer: Self-pay | Admitting: Oncology

## 2023-08-11 ENCOUNTER — Encounter: Payer: Self-pay | Admitting: Oncology

## 2023-08-11 ENCOUNTER — Other Ambulatory Visit: Payer: Self-pay | Admitting: Oncology

## 2023-08-11 ENCOUNTER — Ambulatory Visit: Payer: Medicare HMO | Attending: Cardiology | Admitting: Cardiology

## 2023-08-11 VITALS — BP 120/80 | HR 80 | Ht 61.0 in | Wt 115.0 lb

## 2023-08-11 DIAGNOSIS — I50812 Chronic right heart failure: Secondary | ICD-10-CM

## 2023-08-11 DIAGNOSIS — R002 Palpitations: Secondary | ICD-10-CM

## 2023-08-11 DIAGNOSIS — E785 Hyperlipidemia, unspecified: Secondary | ICD-10-CM

## 2023-08-11 DIAGNOSIS — I471 Supraventricular tachycardia, unspecified: Secondary | ICD-10-CM

## 2023-08-11 DIAGNOSIS — I2510 Atherosclerotic heart disease of native coronary artery without angina pectoris: Secondary | ICD-10-CM | POA: Diagnosis not present

## 2023-08-11 DIAGNOSIS — C3412 Malignant neoplasm of upper lobe, left bronchus or lung: Secondary | ICD-10-CM

## 2023-08-11 DIAGNOSIS — I272 Pulmonary hypertension, unspecified: Secondary | ICD-10-CM

## 2023-08-11 NOTE — Telephone Encounter (Signed)
Patient had an appointment with Wallis Bamberg, NP in the clinic and she reviewed the patient's heart monitor results with her.

## 2023-08-11 NOTE — Telephone Encounter (Signed)
Contacted pt to schedule an appt. Unable to reach via phone, voicemail was left.   (Labs will need to be drawn at minimum 5 days prior to injection since labs are needed to be submitted for auth.)    Prolia and labs Received: Today Dellia Beckwith, MD  Phy, Georga Hacking, RPH; Thomasena Edis; [place], [place]; Garnette Scheuermann, RN She is due for Prolia injection and also needs labs of CBC, CMP, CEA.  Please schedule

## 2023-08-11 NOTE — Patient Instructions (Signed)
Medication Instructions:  Your physician recommends that you continue on your current medications as directed. Please refer to the Current Medication list given to you today.  *If you need a refill on your cardiac medications before your next appointment, please call your pharmacy* Please only take  Claritin or Zyrtec for allergies.   Labs:  Your physician recommends that you have labs done in the office today. Your test included  basic metabolic panel, magnesium and a thyroid panel.   If you have labs (blood work) drawn today and your tests are completely normal, you will receive your results only by: MyChart Message (if you have MyChart) OR A paper copy in the mail If you have any lab test that is abnormal or we need to change your treatment, we will call you to review the results.  Follow-Up: At [facility], you and your health needs are our priority.  As part of our continuing mission to provide you with exceptional heart care, we have created designated Provider Care Teams.  These Care Teams include your primary Cardiologist (physician) and Advanced Practice Providers (APPs -  Physician Assistants and Nurse Practitioners) who all work together to provide you with the care you need, when you need it.  We recommend signing up for the patient portal called "MyChart".  Sign up information is provided on this After Visit Summary.  MyChart is used to connect with patients for Virtual Visits (Telemedicine).  Patients are able to view lab/test results, encounter notes, upcoming appointments, etc.  Non-urgent messages can be sent to your provider as well.   To learn more about what you can do with MyChart, go to [email or web address].    Your next appointment:   Follow up with Dr. Dulce Sellar in 6 weeks

## 2023-08-12 ENCOUNTER — Telehealth: Payer: Self-pay | Admitting: *Deleted

## 2023-08-12 DIAGNOSIS — J189 Pneumonia, unspecified organism: Secondary | ICD-10-CM | POA: Diagnosis not present

## 2023-08-12 DIAGNOSIS — G4489 Other headache syndrome: Secondary | ICD-10-CM | POA: Diagnosis not present

## 2023-08-12 DIAGNOSIS — Z743 Need for continuous supervision: Secondary | ICD-10-CM | POA: Diagnosis not present

## 2023-08-12 DIAGNOSIS — I4510 Unspecified right bundle-branch block: Secondary | ICD-10-CM | POA: Diagnosis not present

## 2023-08-12 DIAGNOSIS — S20211A Contusion of right front wall of thorax, initial encounter: Secondary | ICD-10-CM | POA: Diagnosis not present

## 2023-08-12 DIAGNOSIS — R55 Syncope and collapse: Secondary | ICD-10-CM | POA: Diagnosis not present

## 2023-08-12 DIAGNOSIS — S0990XA Unspecified injury of head, initial encounter: Secondary | ICD-10-CM | POA: Diagnosis not present

## 2023-08-12 DIAGNOSIS — R9431 Abnormal electrocardiogram [ECG] [EKG]: Secondary | ICD-10-CM | POA: Diagnosis not present

## 2023-08-12 DIAGNOSIS — R42 Dizziness and giddiness: Secondary | ICD-10-CM | POA: Diagnosis not present

## 2023-08-12 DIAGNOSIS — R0781 Pleurodynia: Secondary | ICD-10-CM | POA: Diagnosis not present

## 2023-08-12 DIAGNOSIS — W19XXXA Unspecified fall, initial encounter: Secondary | ICD-10-CM | POA: Diagnosis not present

## 2023-08-12 DIAGNOSIS — M549 Dorsalgia, unspecified: Secondary | ICD-10-CM | POA: Diagnosis not present

## 2023-08-12 LAB — THYROID PANEL WITH TSH
Free Thyroxine Index: 2 (ref 1.2–4.9)
T3 Uptake Ratio: 25 % (ref 24–39)
T4, Total: 8 ug/dL (ref 4.5–12.0)
TSH: 3.39 u[IU]/mL (ref 0.450–4.500)

## 2023-08-12 LAB — BASIC METABOLIC PANEL WITH GFR
BUN/Creatinine Ratio: 13 (ref 12–28)
BUN: 12 mg/dL (ref 8–27)
CO2: 26 mmol/L (ref 20–29)
Calcium: 9.3 mg/dL (ref 8.7–10.3)
Chloride: 102 mmol/L (ref 96–106)
Creatinine, Ser: 0.95 mg/dL (ref 0.57–1.00)
Glucose: 83 mg/dL (ref 70–99)
Potassium: 4.4 mmol/L (ref 3.5–5.2)
Sodium: 142 mmol/L (ref 134–144)
eGFR: 62 mL/min/1.73

## 2023-08-12 LAB — MAGNESIUM: Magnesium: 1.7 mg/dL (ref 1.6–2.3)

## 2023-08-12 NOTE — Telephone Encounter (Signed)
-----   Message from Flossie Dibble sent at 08/12/2023  9:41 AM EST ----- Labs look good.  Spoke with Dr. Dulce Sellar, does not want to start any medications.   Please send her back to EP (Dr. Ladona Ridgel) for junctional rhythm and episodes of SVT -- she also requested to see him again.

## 2023-08-12 NOTE — Telephone Encounter (Signed)
Informed pt of lab results and advice by Dr. Dulce Sellar. Pt verbalized understanding and had no further questions.

## 2023-08-14 DIAGNOSIS — J45909 Unspecified asthma, uncomplicated: Secondary | ICD-10-CM | POA: Diagnosis not present

## 2023-08-18 ENCOUNTER — Telehealth: Payer: Self-pay | Admitting: Oncology

## 2023-08-18 NOTE — Telephone Encounter (Signed)
08/18/23 Patient cancelled all appts.She had injury to her rib cage and did not want to reschedule.

## 2023-08-20 ENCOUNTER — Ambulatory Visit: Payer: Medicare HMO

## 2023-08-20 ENCOUNTER — Other Ambulatory Visit: Payer: Medicare HMO

## 2023-08-21 ENCOUNTER — Ambulatory Visit: Payer: Medicare HMO | Admitting: Cardiology

## 2023-08-21 DIAGNOSIS — W19XXXA Unspecified fall, initial encounter: Secondary | ICD-10-CM | POA: Diagnosis not present

## 2023-08-21 DIAGNOSIS — S2241XA Multiple fractures of ribs, right side, initial encounter for closed fracture: Secondary | ICD-10-CM | POA: Diagnosis not present

## 2023-08-21 DIAGNOSIS — I4519 Other right bundle-branch block: Secondary | ICD-10-CM | POA: Diagnosis not present

## 2023-08-27 ENCOUNTER — Ambulatory Visit: Payer: Medicare HMO

## 2023-09-16 ENCOUNTER — Encounter: Payer: Self-pay | Admitting: Oncology

## 2023-09-19 ENCOUNTER — Encounter: Payer: Self-pay | Admitting: Oncology

## 2023-09-22 ENCOUNTER — Other Ambulatory Visit: Payer: Self-pay

## 2023-09-22 DIAGNOSIS — C3431 Malignant neoplasm of lower lobe, right bronchus or lung: Secondary | ICD-10-CM

## 2023-09-22 DIAGNOSIS — C3412 Malignant neoplasm of upper lobe, left bronchus or lung: Secondary | ICD-10-CM

## 2023-09-26 ENCOUNTER — Encounter: Payer: Self-pay | Admitting: Oncology

## 2023-09-26 ENCOUNTER — Encounter: Payer: Self-pay | Admitting: Internal Medicine

## 2023-09-26 ENCOUNTER — Ambulatory Visit: Payer: HMO | Attending: Internal Medicine | Admitting: Internal Medicine

## 2023-09-26 VITALS — BP 118/64 | HR 82 | Wt 114.0 lb

## 2023-09-26 DIAGNOSIS — Z01812 Encounter for preprocedural laboratory examination: Secondary | ICD-10-CM | POA: Diagnosis not present

## 2023-09-26 LAB — CBC

## 2023-09-26 NOTE — Progress Notes (Signed)
HPI Angela Phillips is referred by Dr. Royann Shivers for evaluation of SVT. She is a pleasant 78 yo woman with asthma/COPD/remote tobacco abuse and palpitations. She is quite symptomatic with her symptoms. 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 She is mostly symptomatic from the junctional rhythm she is experiencing in the 50 range. She c/o the sensation of pulsations in her neck which are quite disconcerting. She has chest pressure and sob and near syncope when she goes into SVT.   Allergies  Allergen Reactions   Beta Adrenergic Blockers Other (See Comments)    Bradycardia--decompensated after 1 dose of metoprolol prior to cardiac stress test   Aspirin Other (See Comments)    Bleeding (intolerance)   Atorvastatin     Other Reaction(s): Unknown   Buspirone Other (See Comments)    Patient does not recall taking this med   Celecoxib Other (See Comments)    Patient cannot remember reaction   Chlorpheniramine Other (See Comments) and Itching    Patient cannot remember reaction   Citalopram Other (See Comments)    Patient cannot remember reaction   Diltiazem Other (See Comments)    Bradycardia   Ezetimibe Other (See Comments)    Myalgias (intolerance)   Ezetimibe-Simvastatin     Other Reaction(s): Unknown   Hydrocodone Itching   Hydrocodone-Acetaminophen Itching   Other Other (See Comments)   Prochlorperazine Other (See Comments)    "Makes me feel like I'm having a face stroke"   Prochlorperazine Edisylate Other (See Comments)   Prochlorperazine Maleate Other (See Comments)   Rosuvastatin     Other Reaction(s): Unknown   Statins Other (See Comments)    Myalgias (intolerance)   Venlafaxine Other (See Comments)    Patient cannot remember reaction   Verapamil Other (See Comments)    Bradycardia     Current Outpatient Medications  Medication Sig Dispense Refill   albuterol  (PROVENTIL) (2.5 MG/3ML) 0.083% nebulizer solution Take 2.5 mg by nebulization every 6 (six) hours as needed for wheezing or shortness of breath.     celecoxib (CELEBREX) 200 MG capsule Take 200 mg by mouth daily.     clonazePAM (KLONOPIN) 1 MG tablet Take 0.5 tablets (0.5 mg total) by mouth every 6 (six) hours as needed for anxiety.     FLUoxetine (PROZAC) 20 MG capsule Take 20 mg by mouth 2 (two) times daily.     fluticasone (FLONASE) 50 MCG/ACT nasal spray Place 1 spray into both nostrils 2 (two) times daily as needed for allergies or rhinitis. 16 g 0   furosemide (LASIX) 20 MG tablet Take 1 tablet (20 mg total) by mouth daily. 90 tablet 3   gabapentin (NEURONTIN) 300 MG capsule Take 300 mg by mouth at bedtime.     ibuprofen (ADVIL) 800 MG tablet Take 800 mg by mouth 3 (three) times daily.     levalbuterol (XOPENEX HFA) 45 MCG/ACT inhaler Inhale 2 puffs into the lungs as needed for wheezing or shortness of breath.     losartan-hydrochlorothiazide (HYZAAR) 50-12.5 MG tablet Take 1 tablet by mouth daily.     Multiple Vitamin (MULTI-VITAMINS) TABS Take 1 tablet by mouth daily. Centrum silver     omeprazole (PRILOSEC) 40 MG capsule Take 40 mg by mouth daily.     pravastatin (PRAVACHOL) 80 MG tablet Take 1 tablet (80 mg total) by mouth daily at 6 PM. 30 tablet  0   albuterol (VENTOLIN HFA) 108 (90 Base) MCG/ACT inhaler Inhale 2 puffs into the lungs every 6 (six) hours as needed for wheezing or shortness of breath. (Patient not taking: Reported on 09/26/2023)     budesonide-formoterol (SYMBICORT) 160-4.5 MCG/ACT inhaler Inhale 2 puffs into the lungs daily. (Patient not taking: Reported on 09/26/2023)     Tiotropium Bromide Monohydrate (SPIRIVA RESPIMAT) 1.25 MCG/ACT AERS Inhale 2 Inhalations into the lungs daily. (Patient not taking: Reported on 09/26/2023) 4 g 0   No current facility-administered medications for this visit.     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, 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, treated with SBRT  Clinical stage I     Cardiogenic shock (HCC) 05/08/2023   Cholelithiasis 07/10/2023   Chronic respiratory failure with hypoxia (HCC) 05/13/2023   Closed fracture of left distal humerus 07/10/2023   Community acquired pneumonia 07/10/2023   COPD (chronic obstructive pulmonary disease) (HCC)    Coronary artery disease 05/14/2023   Dyslipidemia (high LDL; low HDL) 08/13/2016   Dyspnea on exertion 04/30/2023   Dysuria 07/23/2021   Elbow pain, left 07/10/2023   Eosinophil count raised 07/10/2023   Essential hypertension 07/05/2016   External hemorrhoids with complication 07/10/2023   Falls 11/20/2016   Finger fracture, left 07/10/2023   Ground glass opacity present on imaging of lung 07/10/2023   H/O ultrasound guided needle biopsy of lung 08/24/2020   Hand laceration 07/10/2023   Head contusion 07/10/2023   Hiatal hernia with GERD 07/10/2023   Hip fracture, left (HCC) 07/10/2023   History of radiation therapy 10/24/2020-11/06/2020   SBRT to left and right lungs   Dr Antony Blackbird   HTN (hypertension) 11/20/2016   Hyperlipidemia 04/29/2023   Hypokalemia 07/23/2021   Hypotension due to drugs 05/06/2023   Impaired mobility and ADLs 10/28/2017   Infection of right knee (HCC) 07/10/2023   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   Left displaced  femoral neck fracture (HCC) 07/10/2023   Left rib fracture 07/10/2023   Lung cancer (HCC) 07/10/2023   Malignant neoplasm of upper lobe of left lung (HCC) 05/12/2023   Moderate persistent asthma 07/10/2023   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   Occipital neuralgia of left side 07/10/2023   Osteoporosis 07/12/2022   PAF (paroxysmal atrial fibrillation) (HCC) 07/10/2023   Palpitations 07/05/2016   Paroxysmal SVT (supraventricular tachycardia) (HCC) 07/05/2016   Personal history of malignant neoplasm of breast 04/29/2023   Pleural effusion 05/12/2023   Pneumothorax on right 08/24/2020   Pulmonary HTN (HCC) 05/12/2023   Rheumatoid arthritis (HCC) 04/29/2023   Septic olecranon bursitis of left elbow 07/10/2023   Shock liver 05/07/2023   Sinusitis 07/10/2023   Skin tear of left elbow without complication 07/10/2023   Status post total knee replacement, 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   Past Surgical History:  Procedure Laterality Date   ABDOMINAL HYSTERECTOMY     CARDIAC CATHETERIZATION     CARPAL TUNNEL RELEASE     CATARACT EXTRACTION     CHOLECYSTECTOMY     HEMORROIDECTOMY     JOINT REPLACEMENT     R knee   ORIF left elbow Left 12/2021   RIGHT/LEFT HEART CATH AND CORONARY ANGIOGRAPHY N/A 05/08/2023   Procedure: RIGHT/LEFT HEART CATH AND CORONARY ANGIOGRAPHY;  Surgeon: Swaziland, Peter M, MD;  Location: [facility] INVASIVE CV LAB;  Service: Cardiovascular;  Laterality: N/A;   SVT ABLATION N/A 10/19/2018   Procedure: SVT ABLATION;  Surgeon: Marinus Maw, MD;  Location: [facility] INVASIVE CV LAB;  Service: Cardiovascular;  Laterality: N/A;    TOE SURGERY     VIDEO BRONCHOSCOPY WITH ENDOBRONCHIAL NAVIGATION  08/24/2020   VIDEO BRONCHOSCOPY WITH ENDOBRONCHIAL NAVIGATION N/A 08/24/2020   Procedure: VIDEO BRONCHOSCOPY WITH ENDOBRONCHIAL NAVIGATION;  Surgeon: Loreli Slot, MD;  Location: [facility] OR;  Service: Thoracic;  Laterality: N/A;     Family History  Problem Relation Age of Onset   Thyroid disease Brother    Dementia Father    Heart disease Mother    Diabetes Mother    Thyroid disease Sister      Social History   Socioeconomic History   Marital status: Married    Spouse name: Not on file   Number of children: Not on file   Years of education: Not on file   Highest education level: Not on file  Occupational History   Not on file  Tobacco Use   Smoking status: Former    Current packs/day: 0.50    Average packs/day: 0.5 packs/day for 15.0 years (7.5 ttl pk-yrs)    Types: Cigarettes   Smokeless tobacco: Former    Quit date: 09/09/1989  Vaping Use   Vaping status: Never Used  Substance and Sexual Activity   Alcohol use: No   Drug use: No   Sexual activity: Not on file  Other Topics Concern   Not on file  Social History Narrative   Not on file   Social Drivers of Health   Financial Resource Strain: Low Risk  (08/20/2022)   Overall Financial Resource Strain (CARDIA)    Difficulty of Paying Living Expenses: Not very hard  Food Insecurity: No Food Insecurity (05/07/2023)   Hunger Vital Sign    Worried About Running Out of Food in the Last Year: Never true    Ran Out of Food in the Last Year: Never true  Transportation Needs: No Transportation Needs (05/07/2023)   PRAPARE - Administrator, Civil Service (Medical): No    Lack of Transportation (Non-Medical): No  Physical Activity: Inactive (08/20/2022)   Exercise Vital Sign    Days of Exercise per Week: 0 days    Minutes of Exercise per Session: 0 min  Stress: Stress Concern Present (08/20/2022)   [facility] [employer]  - Occupational Stress Questionnaire    Feeling of Stress : To some extent  Social Connections: Moderately Isolated (08/20/2022)   Social Connection and Isolation Panel [NHANES]    Frequency of Communication with Friends and  Family: More than three times a week    Frequency of Social Gatherings with Friends and Family: More than three times a week    Attends Religious Services: Never    Database administrator or Organizations: No    Attends Banker Meetings: Never    Marital Status: Married  Catering manager Violence: Not At Risk (05/07/2023)   Humiliation, Afraid, Rape, and Kick questionnaire    Fear of Current or Ex-Partner: No    Emotionally Abused: No    Physically Abused: No    Sexually Abused: No     BP 118/64   Pulse 82   Wt 114 lb (51.7 kg)   SpO2 95%   BMI 21.54 kg/m   Physical Exam:  Well appearing NAD HEENT: Unremarkable Neck:  No JVD, no thyromegally Lymphatics:  No adenopathy Back:  No CVA tenderness Lungs:  Clear with no wheezes HEART:  Regular rate rhythm, no murmurs, no rubs, no clicks Abd:  soft, positive bowel sounds, no organomegally, no rebound, no guarding Ext:  2 plus pulses, no edema, no cyanosis, no clubbing Skin:  No rashes no nodules Neuro:  CN II through XII intact, motor grossly intact  Assess/Plan: Symptomatic junctional rhythm - the patient has sinus node dysfunction (mild) but is quite symptomatic with her junctional rhythm which she has for minutes at a time. I have reassured her that her situation is not life threatening. I would like to treat this but cannot give her flecainide or a beta or calcium blocker due to her sinus node dysfunction.  SVT - she is still having symptoms.  Sinus node dysfunction - this is mild and agree with Dr. Thersa Salt assessment that this alone will not require PPM. However with her symptomatic junctional rhythm due to simultaneous contraction of the atrium and the ventricle, I have recommended PPM  insertion followed by the initiation of an AA drug and a calcium or beta blocker.   Hendy Brindle,MD Sinus node dysfunction -

## 2023-09-26 NOTE — H&P (View-Only) (Signed)
HPI Angela Phillips is referred by Dr. Royann Shivers for evaluation of SVT. She is a pleasant 78 yo woman with asthma/COPD/remote tobacco abuse and palpitations. She is quite symptomatic with her symptoms. 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 She is mostly symptomatic from the junctional rhythm she is experiencing in the 50 range. She c/o the sensation of pulsations in her neck which are quite disconcerting. She has chest pressure and sob and near syncope when she goes into SVT.   Allergies  Allergen Reactions   Beta Adrenergic Blockers Other (See Comments)    Bradycardia--decompensated after 1 dose of metoprolol prior to cardiac stress test   Aspirin Other (See Comments)    Bleeding (intolerance)   Atorvastatin     Other Reaction(s): Unknown   Buspirone Other (See Comments)    Patient does not recall taking this med   Celecoxib Other (See Comments)    Patient cannot remember reaction   Chlorpheniramine Other (See Comments) and Itching    Patient cannot remember reaction   Citalopram Other (See Comments)    Patient cannot remember reaction   Diltiazem Other (See Comments)    Bradycardia   Ezetimibe Other (See Comments)    Myalgias (intolerance)   Ezetimibe-Simvastatin     Other Reaction(s): Unknown   Hydrocodone Itching   Hydrocodone-Acetaminophen Itching   Other Other (See Comments)   Prochlorperazine Other (See Comments)    "Makes me feel like I'm having a face stroke"   Prochlorperazine Edisylate Other (See Comments)   Prochlorperazine Maleate Other (See Comments)   Rosuvastatin     Other Reaction(s): Unknown   Statins Other (See Comments)    Myalgias (intolerance)   Venlafaxine Other (See Comments)    Patient cannot remember reaction   Verapamil Other (See Comments)    Bradycardia     Current Outpatient Medications  Medication Sig Dispense Refill   albuterol  (PROVENTIL) (2.5 MG/3ML) 0.083% nebulizer solution Take 2.5 mg by nebulization every 6 (six) hours as needed for wheezing or shortness of breath.     celecoxib (CELEBREX) 200 MG capsule Take 200 mg by mouth daily.     clonazePAM (KLONOPIN) 1 MG tablet Take 0.5 tablets (0.5 mg total) by mouth every 6 (six) hours as needed for anxiety.     FLUoxetine (PROZAC) 20 MG capsule Take 20 mg by mouth 2 (two) times daily.     fluticasone (FLONASE) 50 MCG/ACT nasal spray Place 1 spray into both nostrils 2 (two) times daily as needed for allergies or rhinitis. 16 g 0   furosemide (LASIX) 20 MG tablet Take 1 tablet (20 mg total) by mouth daily. 90 tablet 3   gabapentin (NEURONTIN) 300 MG capsule Take 300 mg by mouth at bedtime.     ibuprofen (ADVIL) 800 MG tablet Take 800 mg by mouth 3 (three) times daily.     levalbuterol (XOPENEX HFA) 45 MCG/ACT inhaler Inhale 2 puffs into the lungs as needed for wheezing or shortness of breath.     losartan-hydrochlorothiazide (HYZAAR) 50-12.5 MG tablet Take 1 tablet by mouth daily.     Multiple Vitamin (MULTI-VITAMINS) TABS Take 1 tablet by mouth daily. Centrum silver     omeprazole (PRILOSEC) 40 MG capsule Take 40 mg by mouth daily.     pravastatin (PRAVACHOL) 80 MG tablet Take 1 tablet (80 mg total) by mouth daily at 6 PM. 30 tablet  0   albuterol (VENTOLIN HFA) 108 (90 Base) MCG/ACT inhaler Inhale 2 puffs into the lungs every 6 (six) hours as needed for wheezing or shortness of breath. (Patient not taking: Reported on 09/26/2023)     budesonide-formoterol (SYMBICORT) 160-4.5 MCG/ACT inhaler Inhale 2 puffs into the lungs daily. (Patient not taking: Reported on 09/26/2023)     Tiotropium Bromide Monohydrate (SPIRIVA RESPIMAT) 1.25 MCG/ACT AERS Inhale 2 Inhalations into the lungs daily. (Patient not taking: Reported on 09/26/2023) 4 g 0   No current facility-administered medications for this visit.     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, 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, treated with SBRT  Clinical stage I     Cardiogenic shock (HCC) 05/08/2023   Cholelithiasis 07/10/2023   Chronic respiratory failure with hypoxia (HCC) 05/13/2023   Closed fracture of left distal humerus 07/10/2023   Community acquired pneumonia 07/10/2023   COPD (chronic obstructive pulmonary disease) (HCC)    Coronary artery disease 05/14/2023   Dyslipidemia (high LDL; low HDL) 08/13/2016   Dyspnea on exertion 04/30/2023   Dysuria 07/23/2021   Elbow pain, left 07/10/2023   Eosinophil count raised 07/10/2023   Essential hypertension 07/05/2016   External hemorrhoids with complication 07/10/2023   Falls 11/20/2016   Finger fracture, left 07/10/2023   Ground glass opacity present on imaging of lung 07/10/2023   H/O ultrasound guided needle biopsy of lung 08/24/2020   Hand laceration 07/10/2023   Head contusion 07/10/2023   Hiatal hernia with GERD 07/10/2023   Hip fracture, left (HCC) 07/10/2023   History of radiation therapy 10/24/2020-11/06/2020   SBRT to left and right lungs   Dr Antony Blackbird   HTN (hypertension) 11/20/2016   Hyperlipidemia 04/29/2023   Hypokalemia 07/23/2021   Hypotension due to drugs 05/06/2023   Impaired mobility and ADLs 10/28/2017   Infection of right knee (HCC) 07/10/2023   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   Left displaced  femoral neck fracture (HCC) 07/10/2023   Left rib fracture 07/10/2023   Lung cancer (HCC) 07/10/2023   Malignant neoplasm of upper lobe of left lung (HCC) 05/12/2023   Moderate persistent asthma 07/10/2023   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   Occipital neuralgia of left side 07/10/2023   Osteoporosis 07/12/2022   PAF (paroxysmal atrial fibrillation) (HCC) 07/10/2023   Palpitations 07/05/2016   Paroxysmal SVT (supraventricular tachycardia) (HCC) 07/05/2016   Personal history of malignant neoplasm of breast 04/29/2023   Pleural effusion 05/12/2023   Pneumothorax on right 08/24/2020   Pulmonary HTN (HCC) 05/12/2023   Rheumatoid arthritis (HCC) 04/29/2023   Septic olecranon bursitis of left elbow 07/10/2023   Shock liver 05/07/2023   Sinusitis 07/10/2023   Skin tear of left elbow without complication 07/10/2023   Status post total knee replacement, 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   Past Surgical History:  Procedure Laterality Date   ABDOMINAL HYSTERECTOMY     CARDIAC CATHETERIZATION     CARPAL TUNNEL RELEASE     CATARACT EXTRACTION     CHOLECYSTECTOMY     HEMORROIDECTOMY     JOINT REPLACEMENT     R knee   ORIF left elbow Left 12/2021   RIGHT/LEFT HEART CATH AND CORONARY ANGIOGRAPHY N/A 05/08/2023   Procedure: RIGHT/LEFT HEART CATH AND CORONARY ANGIOGRAPHY;  Surgeon: Swaziland, Peter M, MD;  Location: [facility] INVASIVE CV LAB;  Service: Cardiovascular;  Laterality: N/A;   SVT ABLATION N/A 10/19/2018   Procedure: SVT ABLATION;  Surgeon: Marinus Maw, MD;  Location: [facility] INVASIVE CV LAB;  Service: Cardiovascular;  Laterality: N/A;    TOE SURGERY     VIDEO BRONCHOSCOPY WITH ENDOBRONCHIAL NAVIGATION  08/24/2020   VIDEO BRONCHOSCOPY WITH ENDOBRONCHIAL NAVIGATION N/A 08/24/2020   Procedure: VIDEO BRONCHOSCOPY WITH ENDOBRONCHIAL NAVIGATION;  Surgeon: Loreli Slot, MD;  Location: [facility] OR;  Service: Thoracic;  Laterality: N/A;     Family History  Problem Relation Age of Onset   Thyroid disease Brother    Dementia Father    Heart disease Mother    Diabetes Mother    Thyroid disease Sister      Social History   Socioeconomic History   Marital status: Married    Spouse name: Not on file   Number of children: Not on file   Years of education: Not on file   Highest education level: Not on file  Occupational History   Not on file  Tobacco Use   Smoking status: Former    Current packs/day: 0.50    Average packs/day: 0.5 packs/day for 15.0 years (7.5 ttl pk-yrs)    Types: Cigarettes   Smokeless tobacco: Former    Quit date: 09/09/1989  Vaping Use   Vaping status: Never Used  Substance and Sexual Activity   Alcohol use: No   Drug use: No   Sexual activity: Not on file  Other Topics Concern   Not on file  Social History Narrative   Not on file   Social Drivers of Health   Financial Resource Strain: Low Risk  (08/20/2022)   Overall Financial Resource Strain (CARDIA)    Difficulty of Paying Living Expenses: Not very hard  Food Insecurity: No Food Insecurity (05/07/2023)   Hunger Vital Sign    Worried About Running Out of Food in the Last Year: Never true    Ran Out of Food in the Last Year: Never true  Transportation Needs: No Transportation Needs (05/07/2023)   PRAPARE - Administrator, Civil Service (Medical): No    Lack of Transportation (Non-Medical): No  Physical Activity: Inactive (08/20/2022)   Exercise Vital Sign    Days of Exercise per Week: 0 days    Minutes of Exercise per Session: 0 min  Stress: Stress Concern Present (08/20/2022)   [employer]ational [employer]  - Occupational Stress Questionnaire    Feeling of Stress : To some extent  Social Connections: Moderately Isolated (08/20/2022)   Social Connection and Isolation Panel [NHANES]    Frequency of Communication with Friends and  Family: More than three times a week    Frequency of Social Gatherings with Friends and Family: More than three times a week    Attends Religious Services: Never    Database administrator or Organizations: No    Attends Banker Meetings: Never    Marital Status: Married  Catering manager Violence: Not At Risk (05/07/2023)   Humiliation, Afraid, Rape, and Kick questionnaire    Fear of Current or Ex-Partner: No    Emotionally Abused: No    Physically Abused: No    Sexually Abused: No     BP 118/64   Pulse 82   Wt 114 lb (51.7 kg)   SpO2 95%   BMI 21.54 kg/m   Physical Exam:  Well appearing NAD HEENT: Unremarkable Neck:  No JVD, no thyromegally Lymphatics:  No adenopathy Back:  No CVA tenderness Lungs:  Clear with no wheezes HEART:  Regular rate rhythm, no murmurs, no rubs, no clicks Abd:  soft, positive bowel sounds, no organomegally, no rebound, no guarding Ext:  2 plus pulses, no edema, no cyanosis, no clubbing Skin:  No rashes no nodules Neuro:  CN II through XII intact, motor grossly intact  Assess/Plan: Symptomatic junctional rhythm - the patient has sinus node dysfunction (mild) but is quite symptomatic with her junctional rhythm which she has for minutes at a time. I have reassured her that her situation is not life threatening. I would like to treat this but cannot give her flecainide or a beta or calcium blocker due to her sinus node dysfunction.  SVT - she is still having symptoms.  Sinus node dysfunction - this is mild and agree with Dr. Thersa Salt assessment that this alone will not require PPM. However with her symptomatic junctional rhythm due to simultaneous contraction of the atrium and the ventricle, I have recommended PPM  insertion followed by the initiation of an AA drug and a calcium or beta blocker.   Angela Brindle,MD Sinus node dysfunction -

## 2023-09-26 NOTE — Patient Instructions (Addendum)
Medication Instructions:  Your physician recommends that you continue on your current medications as directed. Please refer to the Current Medication list given to you today.  *If you need a refill on your cardiac medications before your next appointment, please call your pharmacy*  Lab Work: CBC and BMET -- Today  If you have labs (blood work) drawn today and your tests are completely normal, you will receive your results only by: MyChart Message (if you have MyChart) OR A paper copy in the mail If you have any lab test that is abnormal or we need to change your treatment, we will call you to review the results.  Testing/Procedures: None ordered.  Follow-Up: At [facility], you and your health needs are our priority.  As part of our continuing mission to provide you with exceptional heart care, we have created designated Provider Care Teams.  These Care Teams include your primary Cardiologist (physician) and Advanced Practice Providers (APPs -  Physician Assistants and Nurse Practitioners) who all work together to provide you with the care you need, when you need it.  Your next appointment:   To be scheduled    The format for your next appointment:   In Person  Provider:   Lewayne Bunting, MD{or one of the following Advanced Practice Providers on your designated Care Team:   Francis Dowse, [place] Casimiro Needle "Mardelle Matte" [place], [place] Earnest Rosier, NP  Remote monitoring is used to monitor your Pacemaker/ ICD from home. This monitoring reduces the number of office visits required to check your device to one time per year. It allows [place] to keep an eye on the functioning of your device to ensure it is working properly.  Important Information About Sugar

## 2023-09-26 NOTE — Progress Notes (Deleted)
Cardiology Office Note:    Date:  09/29/2023   ID:  Angela Phillips, DOB [birth year], MRN [id number]  PCP:  Hurshel Party, NP  Cardiologist:  Norman Herrlich, MD    Referring MD: Hurshel Party, NP    ASSESSMENT:    1. Cor pulmonale, chronic (HCC)   2. Chronic right-sided heart failure (HCC)   3. Pulmonary HTN (HCC)   4. Chronic hypoxemic respiratory failure (HCC)   5. Paroxysmal SVT (supraventricular tachycardia) (HCC)   6. Chronic obstructive pulmonary disease, unspecified COPD type (HCC)   7. Coronary artery disease involving native coronary artery of native heart without angina pectoris   8. Hyperlipidemia LDL goal <70    PLAN:    In order of problems listed above:  ***   Next appointment: ***   Medication Adjustments/Labs and Tests Ordered: Current medicines are reviewed at length with the patient today.  Concerns regarding medicines are outlined above.  No orders of the defined types were placed in this encounter.  No orders of the defined types were placed in this encounter.    History of Present Illness:    Angela Phillips is a 78 y.o. female with a hx of cor pulmonale chronic right heart failure pulmonary artery hypertension tricuspid regurgitation chronic hypoxic respiratory failure COPD CAD hypertensive heart disease and hyperlipidemia last seen 07/10/2023.  She was seen by advanced heart failure Dr. Gala Romney with bradycardia precipitating heart failure and cardiogenic shock.  She was not advised revascularization during the hospitalization and felt that she had severe end-stage cor pulmonale with pulmonary artery hypertension and severe tricuspid regurgitation with a recommendation to avoid any AV blocking agents in the future.   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 Ladona Ridgel 2020  Following the visit she had an event monitor reported 10/03/2022 the monitor showed  the presence of symptomatic events associated with a junctional rhythm as well as brief runs of atrial tachycardia.  Compliance with diet, lifestyle and medications: *** Past Medical History:  Diagnosis Date   Abnormal CT of the chest 07/10/2023   Accelerated junctional rhythm 11/17/2017   Acute cholecystitis 07/10/2023   Acute cystitis without hematuria 09/04/2020   AKI (acute kidney injury) (HCC) 05/07/2023   Anal prolapse 07/10/2023   Anemia 05/13/2023   Angina pectoris (HCC) 04/30/2023   Anxiety about health 07/10/2023   Anxiety and depression 07/10/2023   Asthma    Atrial fibrillation with RVR (HCC) 07/10/2023   Atypical chest pain 08/13/2016   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/2020   RLL, clinical dx, treated with SBRT     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, 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, treated with SBRT  Clinical stage I     Cardiogenic shock (HCC) 05/08/2023   Cholelithiasis 07/10/2023   Chronic respiratory failure with hypoxia (HCC) 05/13/2023   Closed fracture of left distal humerus 07/10/2023   Community acquired pneumonia 07/10/2023   COPD (chronic obstructive pulmonary disease) (HCC)    Coronary artery disease 05/14/2023   Dyslipidemia (high LDL; low HDL) 08/13/2016   Dyspnea on exertion 04/30/2023   Dysuria 07/23/2021   Elbow pain, left 07/10/2023   Eosinophil count raised 07/10/2023   Essential hypertension 07/05/2016   External hemorrhoids with complication 07/10/2023   Falls 11/20/2016   Finger fracture, left 07/10/2023   Ground glass opacity present  on imaging of lung 07/10/2023   H/O ultrasound guided needle biopsy of lung 08/24/2020   Hand laceration 07/10/2023   Head contusion 07/10/2023   Hiatal hernia with GERD 07/10/2023   Hip fracture, left (HCC) 07/10/2023   History of radiation therapy 10/24/2020-11/06/2020   SBRT to left and right lungs   Dr Antony Blackbird   HTN (hypertension)  11/20/2016   Hyperlipidemia 04/29/2023   Hypokalemia 07/23/2021   Hypotension due to drugs 05/06/2023   Impaired mobility and ADLs 10/28/2017   Infection of right knee (HCC) 07/10/2023   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   Left displaced femoral neck fracture (HCC) 07/10/2023   Left rib fracture 07/10/2023   Lung cancer (HCC) 07/10/2023   Malignant neoplasm of upper lobe of left lung (HCC) 05/12/2023   Moderate persistent asthma 07/10/2023   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   Occipital neuralgia of left side 07/10/2023   Osteoporosis 07/12/2022   PAF (paroxysmal atrial fibrillation) (HCC) 07/10/2023   Palpitations 07/05/2016   Paroxysmal SVT (supraventricular tachycardia) (HCC) 07/05/2016   Personal history of malignant neoplasm of breast 04/29/2023   Pleural effusion 05/12/2023   Pneumothorax on right 08/24/2020   Pulmonary HTN (HCC) 05/12/2023   Rheumatoid arthritis (HCC) 04/29/2023   Septic olecranon bursitis of left elbow 07/10/2023   Shock liver 05/07/2023   Sinusitis 07/10/2023   Skin tear of left elbow without complication 07/10/2023   Status post total knee replacement, 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      EKGs/Labs/Other Studies Reviewed:    The following studies were reviewed today:  Cardiac Studies & Procedures   CARDIAC CATHETERIZATION  CARDIAC CATHETERIZATION 05/08/2023  Narrative   Prox LAD to Mid LAD lesion is 50% stenosed.   1st Diag lesion is 75% stenosed.   Ost Cx to Prox Cx lesion is 50% stenosed.   1st Mrg lesion is  80% stenosed.   Ost RCA lesion is 50% stenosed.   Prox RCA-1 lesion is 50% stenosed.   Prox RCA-2 lesion is 90% stenosed.   LV end diastolic pressure is moderately elevated.   Hemodynamic findings consistent with moderate pulmonary hypertension.  3 vessel CAD. Coronary vessels are very small in caliber. 75% diffuse first diagonal. 50% mid LAD, 80% OM1, 90% mid RCA Moderately elevated LV filling pressures. PCWP 33/30 with mean 27 mm Hg. LVEDP 27 mm Hg Moderate pulmonary HTN. PAP 54/24 mean 37 mm Hg High RA pressures with large V wave c/w severe TR. RA pressure 42/32 mean 25 mm Hg Low cardiac output. PA sat 48%, cardiac output 2.37 L/min with index 1.67.  Plan: medical management. The RCA is only about 2- 2.25 mm in diameter. The larger issue appears to be cardiogenic shock and RV failure.  Findings Coronary Findings Diagnostic  Dominance: Right  Left Main Vessel was injected. Vessel is normal in caliber. Vessel is angiographically normal.  Left Anterior Descending Vessel is small. The vessel is moderately calcified. Prox LAD to Mid LAD lesion is 50% stenosed.  First Diagonal Branch Vessel is small in size. 1st Diag lesion is 75% stenosed. The lesion is segmental.  Left Circumflex Ost Cx to Prox Cx lesion is 50% stenosed.  First Obtuse Marginal Branch 1st Mrg lesion is 80% stenosed.  Right Coronary Artery Vessel is small. Ost RCA lesion is 50% stenosed. Prox RCA-1 lesion is 50% stenosed. Prox RCA-2 lesion is 90% stenosed.  Intervention  No interventions have been documented.   STRESS TESTS  MYOCARDIAL PERFUSION IMAGING 08/11/2020  Narrative  The left ventricular ejection fraction is hyperdynamic (>65%).  This is a low risk study.  The study is normal.  Low risk stress nuclear study with normal perfusion and normal left ventricular regional and global systolic function.  ECHOCARDIOGRAM  ECHOCARDIOGRAM LIMITED 05/07/2023  Narrative ECHOCARDIOGRAM LIMITED  REPORT    Patient Name:   NERMEEN WILES Date of Exam: 05/07/2023 Medical Rec #:  [id number]        Height:       61.0 in Accession #:    [id number]       Weight:       89.3 lb Date of Birth:  1945-11-22         BSA:          1.341 m Patient Age:    77 years         BP:           133/72 mmHg Patient Gender: F                HR:           55 bpm. Exam Location:  Inpatient  Procedure: Cardiac Doppler, Limited Echo and Limited Color Doppler  Indications:    Chest Pain R07.9  History:        Patient has prior history of Echocardiogram examinations, most recent 02/19/2023. Cancer and COPD, Arrythmias:Bradycardia, Signs/Symptoms:Hypotension and Chest Pain; Risk Factors:Hypertension, Former Smoker and Dyslipidemia.  Sonographer:    Aron Baba Referring Phys: [id number] PAULA V ROSS   Sonographer Comments: Technically difficult study due to poor echo windows. IMPRESSIONS   1. Techincally diffculty study with limited views. Left ventricular ejection fraction, by estimation, is 55 to 60%. The left ventricle has normal function. Left ventricular endocardial border not optimally defined to evaluate regional wall motion. 2. Right ventricular systolic function is normal. The right ventricular size is moderately enlarged. 3. Tricuspid valve regurgitation is moderate.  FINDINGS Left Ventricle: Left ventricular ejection fraction, by estimation, is 55 to 60%. The left ventricle has normal function. Left ventricular endocardial border not optimally defined to evaluate regional wall motion. The left ventricular internal cavity size was small. There is no left ventricular hypertrophy.  Right Ventricle: The right ventricular size is moderately enlarged. Right ventricular systolic function is normal.  Pericardium: There is no evidence of pericardial effusion.  Mitral Valve: The mitral valve is normal in structure.  Tricuspid Valve: The tricuspid valve is normal in structure. Tricuspid valve  regurgitation is moderate.  Additional Comments: Spectral Doppler performed. Color Doppler performed.  LEFT VENTRICLE PLAX 2D LVIDd:         2.80 cm LVIDs:         1.40 cm LV PW:         0.70 cm LV IVS:        0.40 cm   LEFT ATRIUM         Index LA diam:    2.70 cm 2.01 cm/m  Epifanio Lesches MD Electronically signed by Epifanio Lesches MD Signature Date/Time: 05/07/2023/4:29:22 PM    Final   MONITORS  LONG TERM MONITOR-LIVE TELEMETRY (3-14  DAYS) 08/04/2023  Narrative Patch Wear Time:  9 days and 21 hours (2024-10-31T11:13:01-399 to 2024-11-14T13:19:42-0500)  Monitor 1 Patient had a min HR of 51 bpm, max HR of 106 bpm, and avg HR of 74 bpm. Predominant underlying rhythm was Sinus Rhythm. Slight P wave morphology changes were noted.  Junctional Rhythm was present. Junctional Rhythm was detected within +/- 45 seconds of symptomatic patient event(s).  Frequent symptomatic events of junctional rhythm some with a very short PR interval others without P waves at an accelerated rate.  Isolated SVEs were rare (<1.0%), SVE Couplets were rare (<1.0%), and SVE Triplets were rare (<1.0%).  Isolated VEs were rare (<1.0%), VE Couplets were rare (<1.0%), and no VE Triplets were present.  Monitor 2 Patient had a min HR of 54 bpm, max HR of 171 bpm, and avg HR of 72 bpm. Predominant underlying rhythm was Sinus Rhythm.  35 Supraventricular Tachycardia runs occurred, the run with the fastest interval lasting 10 beats with a max rate of 171 bpm (avg 148 bpm); the run with the fastest interval was also the longest.  The majority were brief runs of APCs  Isolated SVEs were frequent (9.2%, 54845), SVE Couplets were occasional (2.4%, 7102), and SVE Triplets were rare (<1.0%, 1358).  Isolated VEs were rare (<1.0%, 305), VE Couplets were rare (<1.0%, 18), and VE Triplets were rare (<1.0%, 1).               Recent Labs: 05/13/2023: B Natriuretic Peptide 126.9 05/14/2023: ALT  104 08/11/2023: Magnesium 1.7; TSH 3.390 09/26/2023: BUN 24; Creatinine, Ser 1.10; Hemoglobin 12.3; Platelets 232; Potassium 3.9; Sodium 141  Recent Lipid Panel    Component Value Date/Time   CHOL 118 05/08/2023 0218   CHOL 270 (H) 08/02/2020 1233   TRIG 67 05/08/2023 0218   HDL 37 (L) 05/08/2023 0218   HDL 56 08/02/2020 1233   CHOLHDL 3.2 05/08/2023 0218   VLDL 13 05/08/2023 0218   LDLCALC 68 05/08/2023 0218   LDLCALC 181 (H) 08/02/2020 1233    Physical Exam:    VS:  There were no vitals taken for this visit.    Wt Readings from Last 3 Encounters:  09/26/23 114 lb (51.7 kg)  08/11/23 115 lb (52.2 kg)  07/10/23 111 lb 3.2 oz (50.4 kg)     GEN: *** Well nourished, well developed in no acute distress HEENT: Normal NECK: No JVD; No carotid bruits LYMPHATICS: No lymphadenopathy CARDIAC: ***RRR, no murmurs, rubs, gallops RESPIRATORY:  Clear to auscultation without rales, wheezing or rhonchi  ABDOMEN: Soft, non-tender, non-distended MUSCULOSKELETAL:  No edema; No deformity  SKIN: Warm and dry NEUROLOGIC:  Alert and oriented x 3 PSYCHIATRIC:  Normal affect    Signed, Norman Herrlich, MD  09/29/2023 1:22 PM    [facility]

## 2023-09-27 LAB — BASIC METABOLIC PANEL
BUN/Creatinine Ratio: 22 (ref 12–28)
BUN: 24 mg/dL (ref 8–27)
CO2: 26 mmol/L (ref 20–29)
Calcium: 9.5 mg/dL (ref 8.7–10.3)
Chloride: 100 mmol/L (ref 96–106)
Creatinine, Ser: 1.1 mg/dL — ABNORMAL HIGH (ref 0.57–1.00)
Glucose: 79 mg/dL (ref 70–99)
Potassium: 3.9 mmol/L (ref 3.5–5.2)
Sodium: 141 mmol/L (ref 134–144)
eGFR: 52 mL/min/{1.73_m2} — ABNORMAL LOW (ref >59)

## 2023-09-27 LAB — CBC
Hematocrit: 38.7 % (ref 34.0–46.6)
Hemoglobin: 12.3 g/dL (ref 11.1–15.9)
MCH: 26.7 pg (ref 26.6–33.0)
MCHC: 31.8 g/dL (ref 31.5–35.7)
MCV: 84 fL (ref 79–97)
Platelets: 232 10*3/uL (ref 150–450)
RBC: 4.6 x10E6/uL (ref 3.77–5.28)
RDW: 16.8 % — ABNORMAL HIGH (ref 11.7–15.4)
WBC: 8.8 10*3/uL (ref 3.4–10.8)

## 2023-09-29 ENCOUNTER — Encounter: Payer: Self-pay | Admitting: Oncology

## 2023-09-29 ENCOUNTER — Ambulatory Visit: Payer: HMO | Attending: Cardiology | Admitting: Cardiology

## 2023-09-29 NOTE — Pre-Procedure Instructions (Signed)
Attempted to call patient regarding procedure instructions.  Left voicemail on the following items: Arrival time 515 Nothing to eat or drink after midnight No meds AM of procedure Responsible person to drive you home and stay with you for 24 hrs Wash with special soap night before and morning of procedure

## 2023-09-30 ENCOUNTER — Other Ambulatory Visit: Payer: Self-pay

## 2023-09-30 ENCOUNTER — Encounter (HOSPITAL_COMMUNITY): Payer: Self-pay | Admitting: Internal Medicine

## 2023-09-30 ENCOUNTER — Encounter (HOSPITAL_COMMUNITY)
Admission: RE | Disposition: A | Discharge status: Home / Self Care | Payer: Self-pay | Source: Home / Self Care | Attending: Internal Medicine

## 2023-09-30 ENCOUNTER — Ambulatory Visit (HOSPITAL_COMMUNITY): Payer: HMO

## 2023-09-30 ENCOUNTER — Ambulatory Visit (HOSPITAL_COMMUNITY)
Admission: RE | Admit: 2023-09-30 | Discharge: 2023-09-30 | Disposition: A | Discharge status: Home / Self Care | Payer: HMO | Attending: Internal Medicine | Admitting: Internal Medicine

## 2023-09-30 DIAGNOSIS — J4489 Other specified chronic obstructive pulmonary disease: Secondary | ICD-10-CM | POA: Diagnosis not present

## 2023-09-30 DIAGNOSIS — I495 Sick sinus syndrome: Secondary | ICD-10-CM | POA: Insufficient documentation

## 2023-09-30 DIAGNOSIS — R001 Bradycardia, unspecified: Secondary | ICD-10-CM | POA: Diagnosis not present

## 2023-09-30 DIAGNOSIS — Z950 Presence of cardiac pacemaker: Secondary | ICD-10-CM | POA: Diagnosis not present

## 2023-09-30 DIAGNOSIS — I471 Supraventricular tachycardia, unspecified: Secondary | ICD-10-CM | POA: Insufficient documentation

## 2023-09-30 DIAGNOSIS — I700 Atherosclerosis of aorta: Secondary | ICD-10-CM | POA: Diagnosis not present

## 2023-09-30 DIAGNOSIS — Z87891 Personal history of nicotine dependence: Secondary | ICD-10-CM | POA: Diagnosis not present

## 2023-09-30 HISTORY — PX: PACEMAKER IMPLANT: EP1218

## 2023-09-30 SURGERY — PACEMAKER IMPLANT

## 2023-09-30 MED ORDER — ACETAMINOPHEN 500 MG PO TABS
500.0000 mg | ORAL_TABLET | Freq: Four times a day (QID) | ORAL | Status: DC | PRN
Start: 2023-09-30 — End: 2023-09-30
  Administered 2023-09-30: 500 mg via ORAL
  Filled 2023-09-30: qty 1

## 2023-09-30 MED ORDER — MIDAZOLAM HCL 2 MG/2ML IJ SOLN
INTRAMUSCULAR | Status: AC
  Filled 2023-09-30: qty 2

## 2023-09-30 MED ORDER — LIDOCAINE HCL (PF) 1 % IJ SOLN
INTRAMUSCULAR | Status: AC
Start: 2023-09-30 — End: ?
  Filled 2023-09-30: qty 30

## 2023-09-30 MED ORDER — CEFAZOLIN SODIUM-DEXTROSE 2-4 GM/100ML-% IV SOLN: 2.0000 g | INTRAVENOUS | Status: AC

## 2023-09-30 MED ORDER — LIDOCAINE HCL (PF) 1 % IJ SOLN
INTRAMUSCULAR | Status: DC | PRN
  Administered 2023-09-30: 60 mL

## 2023-09-30 MED ORDER — CEFAZOLIN SODIUM-DEXTROSE 1-4 GM/50ML-% IV SOLN: 1.0000 g | Freq: Once | INTRAVENOUS | Status: DC

## 2023-09-30 MED ORDER — ONDANSETRON HCL 4 MG/2ML IJ SOLN: 4.0000 mg | Freq: Four times a day (QID) | INTRAMUSCULAR | Status: DC | PRN

## 2023-09-30 MED ORDER — CHLORHEXIDINE GLUCONATE 4 % EX SOLN: 4.0000 | Freq: Once | CUTANEOUS | Status: DC

## 2023-09-30 MED ORDER — FENTANYL CITRATE (PF) 100 MCG/2ML IJ SOLN
25.0000 ug | INTRAMUSCULAR | Status: DC | PRN
Start: 2023-09-30 — End: 2023-09-30

## 2023-09-30 MED ORDER — FENTANYL CITRATE (PF) 100 MCG/2ML IJ SOLN
INTRAMUSCULAR | Status: AC
  Filled 2023-09-30: qty 2

## 2023-09-30 MED ORDER — SODIUM CHLORIDE 0.9 % IV SOLN: INTRAVENOUS | Status: DC

## 2023-09-30 MED ORDER — IOHEXOL 350 MG/ML SOLN
INTRAVENOUS | Status: DC | PRN
  Administered 2023-09-30: 10 mL

## 2023-09-30 MED ORDER — SODIUM CHLORIDE 0.9 % IV SOLN
INTRAVENOUS | Status: AC
  Administered 2023-09-30: 80 mg
  Filled 2023-09-30: qty 2

## 2023-09-30 MED ORDER — HEPARIN (PORCINE) IN NACL 1000-0.9 UT/500ML-% IV SOLN
INTRAVENOUS | Status: DC | PRN
  Administered 2023-09-30: 500 mL

## 2023-09-30 MED ORDER — FENTANYL CITRATE (PF) 100 MCG/2ML IJ SOLN
INTRAMUSCULAR | Status: DC | PRN
  Administered 2023-09-30 (×10): 12.5 ug via INTRAVENOUS

## 2023-09-30 MED ORDER — SODIUM CHLORIDE 0.9 % IV SOLN: 80.0000 mg | INTRAVENOUS | Status: AC

## 2023-09-30 MED ORDER — CEFAZOLIN SODIUM-DEXTROSE 2-4 GM/100ML-% IV SOLN
INTRAVENOUS | Status: AC
  Administered 2023-09-30: 2 g via INTRAVENOUS
  Filled 2023-09-30: qty 100

## 2023-09-30 MED ORDER — MIDAZOLAM HCL 5 MG/5ML IJ SOLN
INTRAMUSCULAR | Status: DC | PRN
  Administered 2023-09-30 (×10): 1 mg via INTRAVENOUS

## 2023-09-30 MED ORDER — MIDAZOLAM HCL 2 MG/2ML IJ SOLN
INTRAMUSCULAR | Status: AC
Start: 2023-09-30 — End: ?
  Filled 2023-09-30: qty 2

## 2023-09-30 SURGICAL SUPPLY — 11 items
CABLE SURGICAL S-101-97-12 (CABLE) ×2 IMPLANT
GUIDEWIRE ANGLED .035X150CM (WIRE) IMPLANT
IPG PACE AZUR XT DR MRI W1DR01 (Pacemaker) IMPLANT
KIT MICROPUNCTURE NIT STIFF (SHEATH) IMPLANT
LEAD CAPSURE NOVUS 5076-52CM (Lead) IMPLANT
LEAD CAPSURE NOVUS 5076-58CM (Lead) IMPLANT
PACE AZURE XT DR MRI W1DR01 (Pacemaker) ×1 IMPLANT
PAD DEFIB RADIO PHYSIO CONN (PAD) ×2 IMPLANT
SHEATH 7FR PRELUDE SNAP 13 (SHEATH) IMPLANT
TRAY PACEMAKER INSERTION (PACKS) ×2 IMPLANT
WIRE HI TORQ VERSACORE-J 145CM (WIRE) IMPLANT

## 2023-09-30 NOTE — Interval H&P Note (Signed)
History and Physical Interval Note:  09/30/2023 7:29 AM  Angela Phillips  has presented today for surgery, with the diagnosis of bradicardia.  The various methods of treatment have been discussed with the patient and family. After consideration of risks, benefits and other options for treatment, the patient has consented to  Procedure(s): PACEMAKER IMPLANT (N/A) as a surgical intervention.  The patient's history has been reviewed, patient examined, no change in status, stable for surgery.  I have reviewed the patient's chart and labs.  Questions were answered to the patient's satisfaction.     Lewayne Bunting

## 2023-09-30 NOTE — Discharge Instructions (Signed)
After Your Pacemaker   You have a [employer] Pacemaker  ACTIVITY Do not lift your arm above shoulder height for 1 week after your procedure. After 7 days, you may progress as below.  You should remove your sling 24 hours after your procedure, unless otherwise instructed by your provider.     Tuesday October 07, 2023  Wednesday October 08, 2023 Thursday October 09, 2023 Friday October 10, 2023   Do not lift, push, pull, or carry anything over 10 pounds with the affected arm until 6 weeks (Tuesday November 11, 2023 ) after your procedure.   You may drive AFTER your wound check, unless you have been told otherwise by your provider.   Ask your healthcare provider when you can go back to work   INCISION/Dressing  Remove large square, outer bandage tomorrow (10/01/2023) at 12:00 noon. Do not remove steri-strips  below.    Monitor your Pacemaker site for redness, swelling, and drainage. Call the device clinic at [phone] if you experience these symptoms or fever/chills.  If your incision is sealed with Steri-strips or staples, you may shower 7 days after your procedure or when told by your provider. Do not remove the steri-strips or let the shower hit directly on your site. You may wash around your site with soap and water.    If you were discharged in a sling, please do not wear this during the day more than 48 hours after your surgery unless otherwise instructed. This may increase the risk of stiffness and soreness in your shoulder.   Avoid lotions, ointments, or perfumes over your incision until it is well-healed.  You may use a hot tub or a pool AFTER your wound check appointment if the incision is completely closed.  Pacemaker Alerts:  Some alerts are vibratory and others beep. These are NOT emergencies. Please call our office to let us know. If this occurs at night or on weekends, it can wait until the next business day. Send a remote transmission.  If your device is capable of  reading fluid status (for heart failure), you will be offered monthly monitoring to review this with you.   DEVICE MANAGEMENT Remote monitoring is used to monitor your pacemaker from home. This monitoring is scheduled every 91 days by our office. It allows [place] to keep an eye on the functioning of your device to ensure it is working properly. You will routinely see your Electrophysiologist annually (more often if necessary).   You should receive your ID card for your new device in 4-8 weeks. Keep this card with you at all times once received. Consider wearing a medical alert bracelet or necklace.  Your Pacemaker may be MRI compatible. This will be discussed at your next office visit/wound check.  You should avoid contact with strong electric or magnetic fields.   Do not use amateur (ham) radio equipment or electric (arc) welding torches. MP3 player headphones with magnets should not be used. Some devices are safe to use if held at least 12 inches (30 cm) from your Pacemaker. These include power tools, lawn mowers, and speakers. If you are unsure if something is safe to use, ask your health care provider.  When using your cell phone, hold it to the ear that is on the opposite side from the Pacemaker. Do not leave your cell phone in a pocket over the Pacemaker.  You may safely use electric blankets, heating pads, computers, and microwave ovens.  Call the office right away if: You have chest  pain. You feel more short of breath than you have felt before. You feel more light-headed than you have felt before. Your incision starts to open up.  This information is not intended to replace advice given to you by your health care provider. Make sure you discuss any questions you have with your health care provider.

## 2023-10-01 ENCOUNTER — Telehealth: Payer: Self-pay

## 2023-10-01 NOTE — Telephone Encounter (Signed)
Follow-up after same day discharge: Implant date: 09/30/2023 MD: Lewayne Bunting, MD Device: ppm mdt Location: l chest   Wound check visit: yes 90 day MD follow-up: yes  Remote Transmission received:no   Dressing/sling removed: n/a  Confirm OAC restart on: n/a  Please continue to monitor your cardiac device site for redness, swelling, and drainage. Call the device clinic at [phone] if you experience these symptoms, fever/chills, or have questions about your device.   Remote monitoring is used to monitor your cardiac device from home. This monitoring is scheduled every 91 days by our office. It allows [place] to keep an eye on the functioning of your device to ensure it is working properly.  LVM for pt to call back

## 2023-10-01 NOTE — Telephone Encounter (Signed)
Spoke with pt she is doing ok and transmission was received

## 2023-10-02 ENCOUNTER — Other Ambulatory Visit (HOSPITAL_BASED_OUTPATIENT_CLINIC_OR_DEPARTMENT_OTHER): Payer: HMO | Admitting: Radiology

## 2023-10-02 MED FILL — Midazolam HCl Inj 2 MG/2ML (Base Equivalent): INTRAMUSCULAR | Qty: 10 | Status: AC

## 2023-10-10 DIAGNOSIS — I10 Essential (primary) hypertension: Secondary | ICD-10-CM | POA: Diagnosis not present

## 2023-10-10 DIAGNOSIS — R300 Dysuria: Secondary | ICD-10-CM | POA: Diagnosis not present

## 2023-10-10 DIAGNOSIS — J9611 Chronic respiratory failure with hypoxia: Secondary | ICD-10-CM | POA: Diagnosis not present

## 2023-10-10 DIAGNOSIS — F330 Major depressive disorder, recurrent, mild: Secondary | ICD-10-CM | POA: Diagnosis not present

## 2023-10-10 DIAGNOSIS — E1165 Type 2 diabetes mellitus with hyperglycemia: Secondary | ICD-10-CM | POA: Diagnosis not present

## 2023-10-10 DIAGNOSIS — E785 Hyperlipidemia, unspecified: Secondary | ICD-10-CM | POA: Diagnosis not present

## 2023-10-10 DIAGNOSIS — Z6822 Body mass index (BMI) 22.0-22.9, adult: Secondary | ICD-10-CM | POA: Diagnosis not present

## 2023-10-10 DIAGNOSIS — I5032 Chronic diastolic (congestive) heart failure: Secondary | ICD-10-CM | POA: Diagnosis not present

## 2023-10-10 DIAGNOSIS — F411 Generalized anxiety disorder: Secondary | ICD-10-CM | POA: Diagnosis not present

## 2023-10-10 DIAGNOSIS — E538 Deficiency of other specified B group vitamins: Secondary | ICD-10-CM | POA: Diagnosis not present

## 2023-10-10 DIAGNOSIS — J4540 Moderate persistent asthma, uncomplicated: Secondary | ICD-10-CM | POA: Diagnosis not present

## 2023-10-10 DIAGNOSIS — I471 Supraventricular tachycardia, unspecified: Secondary | ICD-10-CM | POA: Diagnosis not present

## 2023-10-14 ENCOUNTER — Telehealth: Payer: Self-pay | Admitting: Oncology

## 2023-10-14 NOTE — Telephone Encounter (Signed)
10/14/23 LVM no ct scans scheduled for patient on 10/16/23.

## 2023-10-16 ENCOUNTER — Ambulatory Visit: Payer: HMO | Attending: Internal Medicine

## 2023-10-16 ENCOUNTER — Telehealth: Payer: Self-pay

## 2023-10-16 ENCOUNTER — Ambulatory Visit: Payer: HMO

## 2023-10-16 DIAGNOSIS — I471 Supraventricular tachycardia, unspecified: Secondary | ICD-10-CM

## 2023-10-16 DIAGNOSIS — R001 Bradycardia, unspecified: Secondary | ICD-10-CM | POA: Diagnosis not present

## 2023-10-16 LAB — CUP PACEART INCLINIC DEVICE CHECK
Battery Remaining Longevity: 157 mo
Battery Voltage: 3.16 V
Brady Statistic AP VP Percent: 1.79 %
Brady Statistic AP VS Percent: 35.47 %
Brady Statistic AS VP Percent: 0.11 %
Brady Statistic AS VS Percent: 62.64 %
Brady Statistic RA Percent Paced: 38.15 %
Brady Statistic RV Percent Paced: 1.89 %
Date Time Interrogation Session: 20250206171238
Implantable Lead Connection Status: 753985
Implantable Lead Connection Status: 753985
Implantable Lead Implant Date: 20250121
Implantable Lead Implant Date: 20250121
Implantable Lead Location: 753859
Implantable Lead Location: 753860
Implantable Lead Model: 5076
Implantable Lead Model: 5076
Implantable Pulse Generator Implant Date: 20250121
Lead Channel Impedance Value: 380 Ohm
Lead Channel Impedance Value: 456 Ohm
Lead Channel Impedance Value: 627 Ohm
Lead Channel Impedance Value: 703 Ohm
Lead Channel Pacing Threshold Amplitude: 0.5 V
Lead Channel Pacing Threshold Amplitude: 0.75 V
Lead Channel Pacing Threshold Amplitude: 1 V
Lead Channel Pacing Threshold Pulse Width: 0.4 ms
Lead Channel Pacing Threshold Pulse Width: 0.4 ms
Lead Channel Pacing Threshold Pulse Width: 0.4 ms
Lead Channel Sensing Intrinsic Amplitude: 2.5 mV
Lead Channel Sensing Intrinsic Amplitude: 3.125 mV
Lead Channel Sensing Intrinsic Amplitude: 7.25 mV
Lead Channel Sensing Intrinsic Amplitude: 8.25 mV
Lead Channel Setting Pacing Amplitude: 3.5 V
Lead Channel Setting Pacing Amplitude: 3.5 V
Lead Channel Setting Pacing Pulse Width: 0.4 ms
Lead Channel Setting Sensing Sensitivity: 1.2 mV
Zone Setting Status: 755011

## 2023-10-16 MED ORDER — METOPROLOL SUCCINATE ER 25 MG PO TB24: 25.0000 mg | ORAL_TABLET | Freq: Every day | ORAL | 3 refills | Status: AC

## 2023-10-16 NOTE — Telephone Encounter (Signed)
 Pt would like to know if it is ok if she can follow up for pacemaker care with Dr. Lawana Pray.  She lives in [place].

## 2023-10-16 NOTE — Patient Instructions (Addendum)

## 2023-10-16 NOTE — Progress Notes (Addendum)
 Normal Pacemaker wound check. Wound well healed. Thresholds, sensing, and impedances consistent with implant measurements and at 3.5V safety margin/auto capture until 3 month visit. No episodes. Reviewed arm restrictions to continue for 6 weeks total post op.  Pt enrolled in remote follow-up. Pt to start beta blocker post pacemaker implant per discharge documentation.  Discussed with Dr. Waddell.  Pt to start Toprol  XL 25 mg PO daily.  Pt aware and medication sent to pharmacy.

## 2023-10-20 NOTE — Telephone Encounter (Signed)
 Will await device clinic RN to advise on if ok to wait until May for appt......Aaron Aas

## 2023-10-20 NOTE — Telephone Encounter (Signed)
 Per Dr. Carolynne Citron and Dr. Camnitz-ok for Pt to see Dr. Lawana Pray in [place].  Pt lives in [place].  Will reschedule follow up.

## 2023-10-21 DIAGNOSIS — Z043 Encounter for examination and observation following other accident: Secondary | ICD-10-CM | POA: Diagnosis not present

## 2023-10-21 DIAGNOSIS — W19XXXA Unspecified fall, initial encounter: Secondary | ICD-10-CM | POA: Diagnosis not present

## 2023-10-21 DIAGNOSIS — S42292A Other displaced fracture of upper end of left humerus, initial encounter for closed fracture: Secondary | ICD-10-CM | POA: Diagnosis not present

## 2023-10-21 DIAGNOSIS — S0003XA Contusion of scalp, initial encounter: Secondary | ICD-10-CM | POA: Diagnosis not present

## 2023-10-23 ENCOUNTER — Telehealth: Payer: Self-pay | Admitting: Cardiology

## 2023-10-23 DIAGNOSIS — M25512 Pain in left shoulder: Secondary | ICD-10-CM | POA: Diagnosis not present

## 2023-10-23 NOTE — Telephone Encounter (Signed)
Patient missed appt on 01/20/5 with [facility] for a 6 week follow up- scheduled with [place] tomorrow but has a 5 lbs weight gain since yesterday- please advise- if appt not necessary please let front desk know. Patient ok with seeing any provider.  Call [phone] to reach patient

## 2023-10-23 NOTE — Telephone Encounter (Signed)
LVM 2/13

## 2023-10-24 ENCOUNTER — Ambulatory Visit: Payer: HMO

## 2023-10-27 DIAGNOSIS — S42212A Unspecified displaced fracture of surgical neck of left humerus, initial encounter for closed fracture: Secondary | ICD-10-CM | POA: Diagnosis not present

## 2023-10-27 DIAGNOSIS — S42293A Other displaced fracture of upper end of unspecified humerus, initial encounter for closed fracture: Secondary | ICD-10-CM | POA: Diagnosis not present

## 2023-10-27 NOTE — Telephone Encounter (Signed)
 Left message for the patient to call back.

## 2023-10-28 ENCOUNTER — Ambulatory Visit: Payer: Medicare HMO | Admitting: Cardiology

## 2023-10-28 DIAGNOSIS — S42402A Unspecified fracture of lower end of left humerus, initial encounter for closed fracture: Secondary | ICD-10-CM | POA: Diagnosis not present

## 2023-10-28 NOTE — Telephone Encounter (Signed)
 Left message for the patient to call back.

## 2023-10-29 NOTE — Telephone Encounter (Signed)
 Left message for the patient to call back.

## 2023-10-31 NOTE — Telephone Encounter (Signed)
Called the patient and her husband Dimas Aguas answered the phone and stated that "she fell and broke her arm and the shoulder popped out and they were going to try rehab instead of surgery". She was not at home at this time. I explained that she had left a message about gaining 5 lbs in 2 days and we have been trying to reach out to her. Dimas Aguas stated that when she returned home he would have her call our office. Dimas Aguas had no further questions at this time.

## 2023-11-04 NOTE — Telephone Encounter (Signed)
 Called patient and she stated that everything was fine. She was not swelling in her lower extremities and per the patient the best thing she did was to get a Visual merchandiser. Patient had no further questions at this time.

## 2023-11-12 ENCOUNTER — Telehealth: Payer: Self-pay | Admitting: Cardiology

## 2023-11-12 NOTE — Telephone Encounter (Signed)
 It does not look like her transmission was received. Is someone able to assist pt?

## 2023-11-12 NOTE — Telephone Encounter (Signed)
 The pt states she feel real dizzy and SOB by the time she get up. She wants to know if something going wrong. I asked her for a transmission for the nurse to review.

## 2023-11-12 NOTE — Telephone Encounter (Signed)
 I called [employer] and we are unable to get a transmission. I let the pt speak with Baird Lyons. I added the pt to the device clinic schedule per [facility].

## 2023-11-12 NOTE — Telephone Encounter (Signed)
°  1. Has your device fired? no ° °2. Is you device beeping? no ° °3. Are you experiencing draining or swelling at device site?no ° °4. Are you calling to see if we received your device transmission?yes ° °5. Have you passed out? no ° ° ° °Please route to Device Clinic Pool °

## 2023-11-12 NOTE — Telephone Encounter (Signed)
 Spoke w/ pt. She was describing what sounded like orthostatic hypoTN; dizziness and near syncope multiple time this morning on moving. Asymptomatic while on the phone. Unable to get remote monitor to work. Pt noted that she fell about a week ago and broke her L shoulder. She was getting up at night and went to step over something that ultimately wasn't there and fell directly on the left shoulder. Now I am concerned for RV lead integrity d/t recent implant status. Reviewed w/ MD. Strict ED precautions w/ low threshold for going provided to the patient in the intermediary before device clinic appointment made first thing tomorrow. Advised not to drive herself until we check her device.

## 2023-11-13 ENCOUNTER — Telehealth: Payer: Self-pay

## 2023-11-13 ENCOUNTER — Ambulatory Visit: Attending: Cardiovascular Disease

## 2023-11-13 DIAGNOSIS — R001 Bradycardia, unspecified: Secondary | ICD-10-CM

## 2023-11-13 DIAGNOSIS — I495 Sick sinus syndrome: Secondary | ICD-10-CM

## 2023-11-13 LAB — CUP PACEART INCLINIC DEVICE CHECK
Date Time Interrogation Session: 20250306185008
Implantable Lead Connection Status: 753985
Implantable Lead Connection Status: 753985
Implantable Lead Implant Date: 20250121
Implantable Lead Implant Date: 20250121
Implantable Lead Location: 753859
Implantable Lead Location: 753860
Implantable Lead Model: 5076
Implantable Lead Model: 5076
Implantable Pulse Generator Implant Date: 20250121

## 2023-11-13 NOTE — Telephone Encounter (Signed)
 Left message for the patient to call back.

## 2023-11-13 NOTE — Telephone Encounter (Addendum)
 Patient seen by device clinic today for reports of dizziness, feels near syncope mainly with positional changes but overall feels "poorly".  Also, has not been eating or drinking with reported 7lb weight loss in 1 week due to pain from recent shoulder dislocation.  States she has not felt better since getting the device.    Saw orthopedist last week but states, "he did nothing for me".   She is leaving here today and going to [facility] in [facility]o care.  We also discussed possible dehydration, need to check her blood pressures and get pain managed so that she is eating.  If sx's worsen may need ER.    Will discuss symptoms with Dr. Ladona Ridgel and Dr. Dulce Sellar if anything further.    Reports her BP's have been running in the 130's.    Device interrogated today to ensure no concerns for lead dislodgement as she just had all new implant on 09/30/2023.    Device check is normal. Thresholds, sensing and lead impedances all stable for acute period trends, no signs of LOC.  HG's are flat, patient also has had very little activity. No episodes  OF NOTE: Since starting her on Toprol XL 25mg  daily on 2/6 - there is a correlation with significant increase (38% to 96% in Atrial pacing).

## 2023-11-13 NOTE — Progress Notes (Signed)
 Patient in to clinic today after reporting dizziness and near syncope with position changes mostly. Her blood pressures at home have been running in the 130's/80's.  She reports falling about 1 week ago and dislocating her left shoulder.  Concerns for device/lead function as patient is still in 91 day post op window fo new implant.    Device interrogation revealed normal function today.  RA/RV Lead thresholds, sensing and impedance all stable and WNL.   There is significant increase in RA pacing since adding Toprol at 10/16/23 OV.  RA pacing up from 30% to 96% now.   Rate Response is programmed on for patient.  HG's are very flat, patient's activity level has been very low.  No episodes to note.   Device implant/wound site appears WNL with normal healing.  She denies any pain over device site itself although there is a lot of bruising to left of device and under her armpit due to fall.   Patient in significant pain today with left shoulder.  She is not eating or drinking, reports loss of 7lbs in 1week. Suspect her sx's may be related to uncontrolled pain and possible dehydration/malnutrition.  Discuss need to increase food/fluids, check BP readings at home and follow up with her orthopedist.  She plans to leave here and go straight to [facility] or [facility] now for 2nd opinion.  ER precautions given if her symptoms persist or worsen with concerns for dehydration. Patient verbalizes understanding.   Phone message forwarded to Dr. Ladona Ridgel and Dr. Dulce Sellar for review.  Dr. Hulen Shouts office reaching out to set up NV in [place] for orthostatics

## 2023-11-17 NOTE — Telephone Encounter (Signed)
 Patient is scheduled for Wednesday, 11/19/23 for Nurse Visit

## 2023-11-18 DIAGNOSIS — S42295A Other nondisplaced fracture of upper end of left humerus, initial encounter for closed fracture: Secondary | ICD-10-CM | POA: Diagnosis not present

## 2023-11-19 ENCOUNTER — Ambulatory Visit: Attending: Internal Medicine

## 2023-12-09 DIAGNOSIS — S42295D Other nondisplaced fracture of upper end of left humerus, subsequent encounter for fracture with routine healing: Secondary | ICD-10-CM | POA: Diagnosis not present

## 2023-12-11 DIAGNOSIS — E538 Deficiency of other specified B group vitamins: Secondary | ICD-10-CM | POA: Diagnosis not present

## 2023-12-11 DIAGNOSIS — I5032 Chronic diastolic (congestive) heart failure: Secondary | ICD-10-CM | POA: Diagnosis not present

## 2023-12-11 DIAGNOSIS — R5383 Other fatigue: Secondary | ICD-10-CM | POA: Diagnosis not present

## 2023-12-11 DIAGNOSIS — J45901 Unspecified asthma with (acute) exacerbation: Secondary | ICD-10-CM | POA: Diagnosis not present

## 2023-12-11 DIAGNOSIS — Z6821 Body mass index (BMI) 21.0-21.9, adult: Secondary | ICD-10-CM | POA: Diagnosis not present

## 2023-12-11 DIAGNOSIS — S42202D Unspecified fracture of upper end of left humerus, subsequent encounter for fracture with routine healing: Secondary | ICD-10-CM | POA: Diagnosis not present

## 2023-12-11 DIAGNOSIS — M1990 Unspecified osteoarthritis, unspecified site: Secondary | ICD-10-CM | POA: Diagnosis not present

## 2023-12-11 DIAGNOSIS — I471 Supraventricular tachycardia, unspecified: Secondary | ICD-10-CM | POA: Diagnosis not present

## 2023-12-11 DIAGNOSIS — R5381 Other malaise: Secondary | ICD-10-CM | POA: Diagnosis not present

## 2023-12-11 DIAGNOSIS — D539 Nutritional anemia, unspecified: Secondary | ICD-10-CM | POA: Diagnosis not present

## 2023-12-11 DIAGNOSIS — J9611 Chronic respiratory failure with hypoxia: Secondary | ICD-10-CM | POA: Diagnosis not present

## 2023-12-23 DIAGNOSIS — J4551 Severe persistent asthma with (acute) exacerbation: Secondary | ICD-10-CM | POA: Diagnosis not present

## 2023-12-31 ENCOUNTER — Encounter: Payer: HMO | Admitting: Student

## 2024-01-07 ENCOUNTER — Ambulatory Visit

## 2024-01-07 DIAGNOSIS — I495 Sick sinus syndrome: Secondary | ICD-10-CM

## 2024-01-12 ENCOUNTER — Encounter: Payer: Self-pay | Admitting: Cardiology

## 2024-01-12 ENCOUNTER — Ambulatory Visit: Payer: HMO | Attending: Cardiology | Admitting: Cardiology

## 2024-01-12 VITALS — BP 110/64 | HR 72 | Ht 61.0 in | Wt 118.8 lb

## 2024-01-12 DIAGNOSIS — I471 Supraventricular tachycardia, unspecified: Secondary | ICD-10-CM | POA: Diagnosis not present

## 2024-01-12 DIAGNOSIS — I495 Sick sinus syndrome: Secondary | ICD-10-CM

## 2024-01-12 DIAGNOSIS — I498 Other specified cardiac arrhythmias: Secondary | ICD-10-CM | POA: Diagnosis not present

## 2024-01-12 LAB — CUP PACEART REMOTE DEVICE CHECK
Battery Remaining Longevity: 157 mo
Battery Voltage: 3.13 V
Brady Statistic AP VP Percent: 0.13 %
Brady Statistic AP VS Percent: 91.82 %
Brady Statistic AS VP Percent: 0.01 %
Brady Statistic AS VS Percent: 8.04 %
Brady Statistic RA Percent Paced: 92 %
Brady Statistic RV Percent Paced: 0.14 %
Date Time Interrogation Session: 20250430105546
Implantable Lead Connection Status: 753985
Implantable Lead Connection Status: 753985
Implantable Lead Implant Date: 20250121
Implantable Lead Implant Date: 20250121
Implantable Lead Location: 753859
Implantable Lead Location: 753860
Implantable Lead Model: 5076
Implantable Lead Model: 5076
Implantable Pulse Generator Implant Date: 20250121
Lead Channel Impedance Value: 361 Ohm
Lead Channel Impedance Value: 532 Ohm
Lead Channel Impedance Value: 589 Ohm
Lead Channel Impedance Value: 646 Ohm
Lead Channel Pacing Threshold Amplitude: 0.5 V
Lead Channel Pacing Threshold Amplitude: 0.625 V
Lead Channel Pacing Threshold Pulse Width: 0.4 ms
Lead Channel Pacing Threshold Pulse Width: 0.4 ms
Lead Channel Sensing Intrinsic Amplitude: 2.375 mV
Lead Channel Sensing Intrinsic Amplitude: 2.375 mV
Lead Channel Sensing Intrinsic Amplitude: 8.125 mV
Lead Channel Sensing Intrinsic Amplitude: 8.125 mV
Lead Channel Setting Pacing Amplitude: 1.5 V
Lead Channel Setting Pacing Amplitude: 2 V
Lead Channel Setting Pacing Pulse Width: 0.4 ms
Lead Channel Setting Sensing Sensitivity: 1.2 mV
Zone Setting Status: 755011

## 2024-01-12 LAB — CUP PACEART INCLINIC DEVICE CHECK
Date Time Interrogation Session: 20250505160820
Implantable Lead Connection Status: 753985
Implantable Lead Connection Status: 753985
Implantable Lead Implant Date: 20250121
Implantable Lead Implant Date: 20250121
Implantable Lead Location: 753859
Implantable Lead Location: 753860
Implantable Lead Model: 5076
Implantable Lead Model: 5076
Implantable Pulse Generator Implant Date: 20250121

## 2024-01-12 NOTE — Progress Notes (Signed)
  Electrophysiology Office Note:   Date:  01/12/2024  ID:  Angela Phillips, DOB 09/05/1946, MRN [id number]  Primary Cardiologist: Zoe Hinds, MD Primary Heart Failure: None Electrophysiologist: Jamey Harman [name], MD      History of Present Illness:   Angela Phillips is a 78 y.o. female with h/o SVT, asthma, COPD, tobacco abuse, palpitations seen today for routine electrophysiology followup.   Since last being seen in our clinic the patient reports doing well.  Since having the pacemaker implanted, she has significant improvement in her level of energy.  She has no chest pain, and minimal shortness of breath.  She is able to do her daily activities with much more efficiency than prior pacemaker implant.  she denies chest pain, palpitations, dyspnea, PND, orthopnea, nausea, vomiting, dizziness, syncope, edema, weight gain, or early satiety.   Review of systems complete and found to be negative unless listed in HPI.      EP Information / Studies Reviewed:    EKG is ordered today. Personal review as below.  EKG Interpretation Date/Time:  Monday Jan 12 2024 12:30:10 EDT Ventricular Rate:  72 PR Interval:  184 QRS Duration:  112 QT Interval:  396 QTC Calculation: 433 R Axis:   49  Text Interpretation: Atrial-paced rhythm Low voltage QRS Incomplete right bundle branch block When compared with ECG of 30-Sep-2023 13:14, No significant change was found Confirmed by Mehek Grega ([id number]) on 01/12/2024 12:31:32 PM   PPM Interrogation-  reviewed in detail today,  See PACEART report.  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  Risk Assessment/Calculations:              Physical Exam:   VS:  BP 110/64   Pulse 72   Ht 5\' 1"  (1.549 m)   Wt 118 lb 12.8 oz (53.9 kg)   SpO2 98%   BMI 22.45 kg/m    Wt Readings from Last 3 Encounters:  01/12/24 118 lb 12.8 oz (53.9 kg)  09/30/23 114 lb (51.7 kg)  09/26/23 114 lb (51.7 kg)     GEN:  Well nourished, well developed in no acute distress NECK: No JVD; No carotid bruits CARDIAC: Regular rate and rhythm, no murmurs, rubs, gallops RESPIRATORY:  Clear to auscultation without rales, wheezing or rhonchi  ABDOMEN: Soft, non-tender, non-distended EXTREMITIES:  No edema; No deformity   ASSESSMENT AND PLAN:    SND s/p Medtronic PPM  Normal PPM function See Pace Art report Sensing, threshold, impedance within normal limits Device programming reviewed and stable for patient No changes today  2.  SVT: No episodes noted on device interrogation.  No changes.  3.  Symptomatic junctional rhythm: Post pacemaker implant as above.  Disposition:   Follow up with Dr. Lawana Pray in 12 months  Signed, Jerri Glauser [name], MD

## 2024-01-13 DIAGNOSIS — Z0000 Encounter for general adult medical examination without abnormal findings: Secondary | ICD-10-CM | POA: Diagnosis not present

## 2024-01-13 DIAGNOSIS — E538 Deficiency of other specified B group vitamins: Secondary | ICD-10-CM | POA: Diagnosis not present

## 2024-01-13 DIAGNOSIS — Z9181 History of falling: Secondary | ICD-10-CM | POA: Diagnosis not present

## 2024-01-15 ENCOUNTER — Ambulatory Visit (HOSPITAL_BASED_OUTPATIENT_CLINIC_OR_DEPARTMENT_OTHER)
Admission: RE | Admit: 2024-01-15 | Discharge: 2024-01-15 | Disposition: A | Discharge status: Home / Self Care | Source: Ambulatory Visit | Attending: Oncology | Admitting: Oncology

## 2024-01-15 DIAGNOSIS — I700 Atherosclerosis of aorta: Secondary | ICD-10-CM | POA: Diagnosis not present

## 2024-01-15 DIAGNOSIS — K449 Diaphragmatic hernia without obstruction or gangrene: Secondary | ICD-10-CM | POA: Diagnosis not present

## 2024-01-15 DIAGNOSIS — C3490 Malignant neoplasm of unspecified part of unspecified bronchus or lung: Secondary | ICD-10-CM | POA: Diagnosis not present

## 2024-01-15 DIAGNOSIS — C3431 Malignant neoplasm of lower lobe, right bronchus or lung: Secondary | ICD-10-CM

## 2024-01-15 DIAGNOSIS — C3412 Malignant neoplasm of upper lobe, left bronchus or lung: Secondary | ICD-10-CM | POA: Diagnosis not present

## 2024-01-15 LAB — I-STAT CREATININE (MANUAL ENTRY): Creatinine, Ser: 1.1 (ref 0.50–1.10)

## 2024-01-15 MED ORDER — IOHEXOL 300 MG/ML  SOLN
100.0000 mL | Freq: Once | INTRAMUSCULAR | Status: AC | PRN
  Administered 2024-01-15: 75 mL via INTRAVENOUS

## 2024-01-20 DIAGNOSIS — S42295D Other nondisplaced fracture of upper end of left humerus, subsequent encounter for fracture with routine healing: Secondary | ICD-10-CM | POA: Diagnosis not present

## 2024-01-21 ENCOUNTER — Telehealth: Payer: Self-pay | Admitting: Oncology

## 2024-01-21 ENCOUNTER — Other Ambulatory Visit: Payer: Self-pay | Admitting: Oncology

## 2024-01-21 ENCOUNTER — Inpatient Hospital Stay: Attending: Oncology | Admitting: Oncology

## 2024-01-21 ENCOUNTER — Other Ambulatory Visit: Payer: Self-pay

## 2024-01-21 ENCOUNTER — Encounter: Payer: Self-pay | Admitting: Oncology

## 2024-01-21 ENCOUNTER — Inpatient Hospital Stay

## 2024-01-21 VITALS — BP 129/69 | HR 74 | Temp 98.1°F | Resp 18 | Ht 61.0 in | Wt 117.4 lb

## 2024-01-21 DIAGNOSIS — J449 Chronic obstructive pulmonary disease, unspecified: Secondary | ICD-10-CM | POA: Diagnosis not present

## 2024-01-21 DIAGNOSIS — Z886 Allergy status to analgesic agent status: Secondary | ICD-10-CM | POA: Diagnosis not present

## 2024-01-21 DIAGNOSIS — Z888 Allergy status to other drugs, medicaments and biological substances status: Secondary | ICD-10-CM | POA: Insufficient documentation

## 2024-01-21 DIAGNOSIS — R300 Dysuria: Secondary | ICD-10-CM | POA: Diagnosis not present

## 2024-01-21 DIAGNOSIS — Z87440 Personal history of urinary (tract) infections: Secondary | ICD-10-CM | POA: Insufficient documentation

## 2024-01-21 DIAGNOSIS — C3412 Malignant neoplasm of upper lobe, left bronchus or lung: Secondary | ICD-10-CM | POA: Diagnosis not present

## 2024-01-21 DIAGNOSIS — Z79899 Other long term (current) drug therapy: Secondary | ICD-10-CM | POA: Diagnosis not present

## 2024-01-21 DIAGNOSIS — R059 Cough, unspecified: Secondary | ICD-10-CM | POA: Insufficient documentation

## 2024-01-21 DIAGNOSIS — Z885 Allergy status to narcotic agent status: Secondary | ICD-10-CM | POA: Insufficient documentation

## 2024-01-21 DIAGNOSIS — M810 Age-related osteoporosis without current pathological fracture: Secondary | ICD-10-CM

## 2024-01-21 DIAGNOSIS — N390 Urinary tract infection, site not specified: Secondary | ICD-10-CM | POA: Insufficient documentation

## 2024-01-21 DIAGNOSIS — C3431 Malignant neoplasm of lower lobe, right bronchus or lung: Secondary | ICD-10-CM

## 2024-01-21 DIAGNOSIS — J90 Pleural effusion, not elsewhere classified: Secondary | ICD-10-CM | POA: Insufficient documentation

## 2024-01-21 DIAGNOSIS — Z9981 Dependence on supplemental oxygen: Secondary | ICD-10-CM | POA: Insufficient documentation

## 2024-01-21 DIAGNOSIS — M8008XA Age-related osteoporosis with current pathological fracture, vertebra(e), initial encounter for fracture: Secondary | ICD-10-CM | POA: Diagnosis not present

## 2024-01-21 DIAGNOSIS — I2510 Atherosclerotic heart disease of native coronary artery without angina pectoris: Secondary | ICD-10-CM | POA: Insufficient documentation

## 2024-01-21 LAB — URINALYSIS, COMPLETE (UACMP) WITH MICROSCOPIC
Bilirubin Urine: NEGATIVE
Glucose, UA: NEGATIVE mg/dL
Hgb urine dipstick: NEGATIVE
Ketones, ur: NEGATIVE mg/dL
Nitrite: NEGATIVE
Protein, ur: NEGATIVE mg/dL
Specific Gravity, Urine: 1.02 (ref 1.005–1.030)
WBC, UA: >50 WBC/hpf (ref 0–5)
pH: 6.5 (ref 5.0–8.0)

## 2024-01-21 LAB — CBC WITH DIFFERENTIAL (CANCER CENTER ONLY)
Abs Immature Granulocytes: 0.11 10*3/uL — ABNORMAL HIGH (ref 0.00–0.07)
Basophils Absolute: 0 10*3/uL (ref 0.0–0.1)
Basophils Relative: 0 %
Eosinophils Absolute: 0 10*3/uL (ref 0.0–0.5)
Eosinophils Relative: 0 %
HCT: 44 % (ref 36.0–46.0)
Hemoglobin: 14.3 g/dL (ref 12.0–15.0)
Immature Granulocytes: 1 %
Lymphocytes Relative: 27 %
Lymphs Abs: 2.8 10*3/uL (ref 0.7–4.0)
MCH: 28.7 pg (ref 26.0–34.0)
MCHC: 32.5 g/dL (ref 30.0–36.0)
MCV: 88.4 fL (ref 80.0–100.0)
Monocytes Absolute: 0.7 10*3/uL (ref 0.1–1.0)
Monocytes Relative: 7 %
Neutro Abs: 6.7 10*3/uL (ref 1.7–7.7)
Neutrophils Relative %: 65 %
Platelet Count: 283 10*3/uL (ref 150–400)
RBC: 4.98 MIL/uL (ref 3.87–5.11)
RDW: 21.6 % — ABNORMAL HIGH (ref 11.5–15.5)
WBC Count: 10.4 10*3/uL (ref 4.0–10.5)
nRBC: 0 % (ref 0.0–0.2)

## 2024-01-21 LAB — CEA (ACCESS): CEA (CHCC): 2.6 ng/mL (ref 0.00–5.00)

## 2024-01-21 LAB — CMP (CANCER CENTER ONLY)
ALT: 15 U/L (ref 0–44)
AST: 20 U/L (ref 15–41)
Albumin: 3.9 g/dL (ref 3.5–5.0)
Alkaline Phosphatase: 89 U/L (ref 38–126)
Anion gap: 12 (ref 5–15)
BUN: 20 mg/dL (ref 8–23)
CO2: 30 mmol/L (ref 22–32)
Calcium: 10.1 mg/dL (ref 8.9–10.3)
Chloride: 97 mmol/L — ABNORMAL LOW (ref 98–111)
Creatinine: 1.03 mg/dL — ABNORMAL HIGH (ref 0.44–1.00)
GFR, Estimated: 56 mL/min — ABNORMAL LOW (ref >60)
Glucose, Bld: 84 mg/dL (ref 70–99)
Potassium: 3.9 mmol/L (ref 3.5–5.1)
Sodium: 139 mmol/L (ref 135–145)
Total Bilirubin: 0.3 mg/dL (ref 0.0–1.2)
Total Protein: 7 g/dL (ref 6.5–8.1)

## 2024-01-21 MED ORDER — CIPROFLOXACIN HCL 500 MG PO TABS: 500.0000 mg | ORAL_TABLET | Freq: Two times a day (BID) | ORAL | 0 refills | Status: AC

## 2024-01-21 NOTE — Telephone Encounter (Signed)
 Patient has been scheduled for follow-up visit per 01/19/24 LOS.  Pt given an appt calendar with date and time.

## 2024-01-21 NOTE — Progress Notes (Addendum)
 Patient Care Team: Olga Berthold, NP as PCP - General (Internal Medicine) Sandee Crook Margart Shears, MD as PCP - Cardiology (Cardiology) Lei Pump, MD as PCP - Electrophysiology (Cardiology) Zelphia Higashi, MD as Consulting Physician (Cardiothoracic Surgery) Retta Caster, MD as Consulting Physician (Radiation Oncology) Lue Sage, MD as Referring Physician (Pulmonary Disease) Nolia Baumgartner, MD as Consulting Physician (Oncology)  Clinic Day: 01/21/2024  Referring physician: Olga Berthold, NP  ASSESSMENT & PLAN:  Assessment & Plan: Cancer of upper lobe of left lung [facility]) History of left upper lobe and right lower lobe nodules, 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 Bronchoscopy with biopsy at that time revealed atypical cells. Angela Phillips was deemed a poor surgical candidate due to multiple comorbidities including severe oxygen  dependent COPD. Angela Phillips completed stereotactic radiation therapy to both lesions in late February 2022. Follow up imaging and office visit in July of 2022 with Dr. Eloise Hake revealed her to be stable with no suspicious areas and expected radiation changes. CT in January of 2023 revealed post treatment changes with no new or progressive findings. In August Angela Phillips had weight loss, fatigue and weakness, and CT 05/09/22 revealed continued evolution of post-treatment changes in the right lower lobe and left upper lobe. CT scan done on October 29, 2022 which reveals postradiation changes but no evidence of recurrent or metastatic disease.  Angela Phillips has a small pleural effusion.  Angela Phillips has a worsening compression of the T8 vertebral body. 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  Angela Phillips was started on Prolia  injections on August 06, 2022 and tolerated this without any difficulty.  Angela Phillips is on calcium  supplement 1200 mg daily and her calcium  level is normal.  However Angela Phillips stopped coming for these injections.  I will revisit this discussion after Angela Phillips has her bone density scan in 6 months  Cardiac arrhythmias Angela Phillips has coronary artery disease and multiple cardiac issues including atherosclerosis and arrhythmias.  Angela Phillips was recently hospitalized in the ICU at [facility] for 2 weeks.  Recurrent UTI's Angela Phillips has had recurrent UTIs and now complains of dysuria and malodorous cloudy urine so we will get a urinalysis and urine culture.  Due to her severe symptoms, I will go ahead and start her on Cipro  while awaiting the culture results.  Plan: I will order a urine culture and urinalysis today for further evaluation of her recurrent UTI symptoms and will prescribe Cipro  500 mg BID for 10 days. Angela Phillips had a CT chest done on 01/15/2024 that revealed no metastatic disease identified within the chest, scarring in the left lung apex appears unchanged however in the right lung lower lobe appears slightly decreased in volume, an new mild superior endplate compression deformity of T3 vertebral body, no significant retropulsion or spinal canal compromise, and multiple other nonacute observations. Patient informed me that Angela Phillips had a pacemaker put in, in February Angela Phillips broke her left shoulder and had fractured ribs in December. Angela Phillips also had hip surgery in the past. CBC, CMP, and CEA today are  pending. Angela Phillips is due for her Prolia  injection and we will repeat that in 6 months. Angela Phillips will also be due for her next bone density scan in 6 months, prior to her next appointment. I will see her back in 6  months with CBC, CMP, and CEA. Angela Phillips understands and agrees with this plan of care and knows to call if Angela Phillips has further concerns regarding her lung cancers.  I provided 25 minutes of time during this this encounter and > 50% was spent counseling as documented under my assessment and plan.   Nolia Baumgartner, MD  [facility] [address] [place] [address] Dept: [phone] Dept Fax: [phone]   Orders Placed This Encounter  Procedures   CEA (Access)    Standing Status:   Future    Number of Occurrences:   1    Expiration Date:   01/20/2025    CHIEF COMPLAINT:  CC: Bilateral non-small cell lung cancers  Current Treatment:  Surveillance  INTERVAL HISTORY:  Angela Phillips is here today for repeat clinical assessment for her bilateral non-small cell lung cancers.  These were treated with stereotactic radiation successfully. Patient states that Angela Phillips feels well but complains of occasional soreness of the left elbow and UTI symptoms. Angela Phillips informed me that Angela Phillips was recently on a course of Cefdinir in April for UTI and this had helped her symptoms temporarily. Since finishing this Angela Phillips states that her UTI symptoms are reoccurring. I will order a urine culture and urinalysis today for further evaluation and will prescribe Cipro  500 mg BID for 10 days. Angela Phillips had a CT chest done on 01/15/2024 that revealed no metastatic disease identified within the chest, scarring in the left lung apex appears unchanged however in the right lung lower lobe appears slightly decreased in volume, an new mild superior endplate compression deformity of T3 vertebral body, no significant retropulsion or spinal canal compromise, and multiple other nonacute observations, as described above. Patient informed me that Angela Phillips had a pacemaker put in, in February Angela Phillips broke her left shoulder and had fractured ribs in December. Angela Phillips also had hip surgery in the past.  CBC, CMP, and CEA today are pending.  However Angela Phillips stopped coming for these injections.  I will revisit this discussion after Angela Phillips has her bone density scan in 6 months  I will see her back in 6 months with CBC, CMP, CEA and bone density scan. Angela Phillips denies fever, chills, night sweats, or other signs of infection. Angela Phillips denies cardiorespiratory and gastrointestinal issues. Angela Phillips  denies pain. Her appetite is ok and her weight has increased 6 pounds over last 6 months.     I have reviewed the past medical history, past surgical history, social history and family history with the patient and they are unchanged from previous note.  ALLERGIES:  is allergic to beta adrenergic blockers, aspirin , atorvastatin, buspirone, celecoxib, chlorpheniramine, citalopram, diltiazem, ezetimibe, ezetimibe-simvastatin , hydrocodone, hydrocodone-acetaminophen , other, prochlorperazine, prochlorperazine edisylate, prochlorperazine maleate, rosuvastatin, statins, venlafaxine, and verapamil .  MEDICATIONS:  Current Outpatient Medications  Medication Sig Dispense Refill   arformoterol  (BROVANA ) 15 MCG/2ML NEBU Take 15 mcg by nebulization every 12 (twelve) hours.     celecoxib (CELEBREX) 200 MG capsule Take 200 mg by mouth 2 (two) times daily.     ciprofloxacin  (CIPRO ) 500 MG tablet Take 1 tablet (500 mg total) by mouth 2 (two) times daily for 10 days. 20 tablet 0   clonazePAM  (KLONOPIN )  1 MG tablet Take 0.5 tablets (0.5 mg total) by mouth every 6 (six) hours as needed for anxiety. (Patient taking differently: Take 1 mg by mouth at bedtime. Additional 1 mg during the day if needed for sleep or anxiety)     FLUoxetine  (PROZAC ) 20 MG capsule Take 20 mg by mouth 2 (two) times daily.     fluticasone  (FLONASE ) 50 MCG/ACT nasal spray Place 1 spray into both nostrils 2 (two) times daily as needed for allergies or rhinitis. 16 g 0   furosemide  (LASIX ) 20 MG tablet Take 1 tablet (20 mg total) by mouth daily. 90 tablet 3   gabapentin  (NEURONTIN )  300 MG capsule Take 300 mg by mouth at bedtime.     ibuprofen (ADVIL) 800 MG tablet Take 800 mg by mouth 3 (three) times daily.     losartan -hydrochlorothiazide  (HYZAAR) 50-12.5 MG tablet Take 1 tablet by mouth daily.     metoprolol  succinate (TOPROL  XL) 25 MG 24 hr tablet Take 1 tablet (25 mg total) by mouth daily. 90 tablet 3   Multiple Vitamin (MULTI-VITAMINS) TABS Take 1 tablet by mouth daily. Centrum silver     omeprazole (PRILOSEC) 40 MG capsule Take 40 mg by mouth daily.     pravastatin  (PRAVACHOL ) 80 MG tablet Take 1 tablet (80 mg total) by mouth daily at 6 PM. 30 tablet 0   Tiotropium Bromide Monohydrate  (SPIRIVA  RESPIMAT) 1.25 MCG/ACT AERS Inhale 2 Inhalations into the lungs daily. 4 g 0   No current facility-administered medications for this visit.    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)   10/11/2020 Cancer Staging   Staging form: Lung, AJCC 8th Edition - Clinical stage from 10/11/2020: Stage IA2 (cT1b, cN0, cM0) - Signed by Nolia Baumgartner, MD on 06/10/2021 Histopathologic type: Adenocarcinoma, NOS Stage prefix: Initial diagnosis Histologic grade (G): GX Histologic grading system: 4 grade system Laterality: Left Tumor size (mm): 16 Lymph-vascular invasion (LVI): Presence of LVI unknown/indeterminate Diagnostic confirmation: Radiography and other imaging techniques without microscopic confirmation Staged by: Managing physician Stage used in treatment planning: Yes National guidelines used in treatment planning: Yes Type of national guideline used in treatment planning: NCCN Staging comments: SBRT   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)  10/11/2020 Initial Diagnosis   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)   10/11/2020 Cancer Staging   Staging form: Lung, AJCC 8th Edition - Clinical stage from 10/11/2020: Stage IA3 (cT1c, cN0, cM0) - Signed by  Nolia Baumgartner, MD on 06/10/2021 Stage prefix: Initial diagnosis Histologic grade (G): GX Histologic grading system: 4 grade system Laterality: Right Tumor size (mm): 22 Lymph-vascular invasion (LVI): Presence of LVI unknown/indeterminate Diagnostic confirmation: Radiography and other imaging techniques without microscopic confirmation Staged by: Managing physician Stage used in treatment planning: Yes National guidelines used in treatment planning: Yes Type of national guideline used in treatment planning: NCCN       REVIEW OF SYSTEMS:  Review of Systems  Constitutional: Negative.  Negative for appetite change, chills, diaphoresis, fatigue, fever and unexpected weight change.  HENT:  Negative.  Negative for hearing loss, lump/mass, mouth sores, nosebleeds, sore throat, tinnitus, trouble swallowing and voice change.   Eyes: Negative.  Negative for eye problems and icterus.  Respiratory:  Positive for cough (with clear/white phlegm). Negative for chest tightness, hemoptysis, shortness of breath and wheezing.  Cardiovascular: Negative.  Negative for chest pain, leg swelling and palpitations.  Gastrointestinal: Negative.  Negative for abdominal distention, abdominal pain, blood in stool, constipation, diarrhea, nausea, rectal pain and vomiting.  Endocrine: Negative.   Genitourinary:  Positive for dysuria. Negative for bladder incontinence, difficulty urinating, dyspareunia, frequency, hematuria, menstrual problem, nocturia, pelvic pain, vaginal bleeding and vaginal discharge.        Cloudy and foul smelling urine  Musculoskeletal: Negative.  Negative for arthralgias, back pain, flank pain, gait problem, myalgias, neck pain and neck stiffness.       Soreness of the left elbow  Skin: Negative.  Negative for itching, rash and wound.  Neurological: Negative.  Negative for extremity weakness, gait problem, numbness, seizures and speech difficulty.  Hematological: Negative.  Negative for  adenopathy. Does not bruise/bleed easily.  Psychiatric/Behavioral: Negative.  Negative for confusion, decreased concentration, depression, sleep disturbance and suicidal ideas. The patient is not nervous/anxious.    VITALS:  Blood pressure 129/69, pulse 74, temperature 98.1 F (36.7 C), temperature source Oral, resp. rate 18, height 5\' 1"  (1.549 m), weight 117 lb 6.4 oz (53.3 kg), SpO2 98%.  Wt Readings from Last 3 Encounters:  01/21/24 117 lb 6.4 oz (53.3 kg)  01/12/24 118 lb 12.8 oz (53.9 kg)  09/30/23 114 lb (51.7 kg)    Body mass index is 22.18 kg/m.  Performance status (ECOG): 1 - Symptomatic but completely ambulatory  PHYSICAL EXAM:  Physical Exam Vitals and nursing note reviewed.  Constitutional:      General: Angela Phillips is not in acute distress.    Appearance: Normal appearance. Angela Phillips is normal weight. Angela Phillips is not ill-appearing, toxic-appearing or diaphoretic.  HENT:     Head: Normocephalic and atraumatic.     Right Ear: Tympanic membrane, ear canal and external ear normal. There is no impacted cerumen.     Left Ear: Tympanic membrane, ear canal and external ear normal. There is no impacted cerumen.     Nose: Nose normal. No congestion or rhinorrhea.     Mouth/Throat:     Mouth: Mucous membranes are moist.     Pharynx: Oropharynx is clear. No oropharyngeal exudate or posterior oropharyngeal erythema.  Eyes:     General: No scleral icterus.       Left eye: No discharge.     Extraocular Movements: Extraocular movements intact.     Conjunctiva/sclera: Conjunctivae normal.     Pupils: Pupils are equal, round, and reactive to light.  Neck:     Vascular: No carotid bruit.  Cardiovascular:     Rate and Rhythm: Normal rate and regular rhythm.     Pulses: Normal pulses.     Heart sounds: Normal heart sounds. No murmur heard.    No friction rub. No gallop.  Pulmonary:     Effort: Pulmonary effort is normal. No respiratory distress.     Breath sounds: No stridor. No wheezing, rhonchi  or rales.  Chest:     Chest wall: No tenderness.  Abdominal:     General: Bowel sounds are normal. There is no distension.     Palpations: Abdomen is soft. There is no hepatomegaly, splenomegaly or mass.     Tenderness: There is no abdominal tenderness. There is no right CVA tenderness, left CVA tenderness, guarding or rebound.     Hernia: No hernia is present.  Musculoskeletal:        General: No swelling, tenderness, deformity or signs of injury. Normal range of motion.     Cervical back: Normal  range of motion and neck supple. No rigidity or tenderness.     Right lower leg: No edema.     Left lower leg: No edema.     Comments: Elevation of the left shoulder with some firmness in the supraclavicular fossa.   Lymphadenopathy:     Cervical: No cervical adenopathy.     Right cervical: No superficial, deep or posterior cervical adenopathy.    Left cervical: No superficial, deep or posterior cervical adenopathy.     Upper Body:     Right upper body: No supraclavicular, axillary or pectoral adenopathy.     Left upper body: No supraclavicular, axillary or pectoral adenopathy.  Skin:    General: Skin is warm and dry.     Coloration: Skin is not jaundiced or pale.     Findings: No bruising, erythema, lesion or rash.  Neurological:     General: No focal deficit present.     Mental Status: Angela Phillips is alert and oriented to person, place, and time. Mental status is at baseline.     Cranial Nerves: No cranial nerve deficit.     Sensory: No sensory deficit.     Motor: No weakness.     Coordination: Coordination normal.     Gait: Gait normal.     Deep Tendon Reflexes: Reflexes normal.  Psychiatric:        Mood and Affect: Mood normal.        Behavior: Behavior normal.        Thought Content: Thought content normal.        Judgment: Judgment normal.   LABORATORY DATA:  I have reviewed the data as listed    Latest Ref Rng & Units 01/21/2024    3:34 PM 09/26/2023   10:23 AM 05/14/2023    3:30 AM   CBC  WBC 4.0 - 10.5 K/uL 10.4  8.8  12.8   Hemoglobin 12.0 - 15.0 g/dL 16.1  09.6  04.5   Hematocrit 36.0 - 46.0 % 44.0  38.7  36.7   Platelets 150 - 400 K/uL 283  232  279       Component Value Date/Time   NA 139 01/21/2024 1534   NA 141 09/26/2023 1023   K 3.9 01/21/2024 1534   CL 97 (L) 01/21/2024 1534   CO2 30 01/21/2024 1534   GLUCOSE 84 01/21/2024 1534   BUN 20 01/21/2024 1534   BUN 24 09/26/2023 1023   CREATININE 1.03 (H) 01/21/2024 1534   CALCIUM  10.1 01/21/2024 1534   PROT 7.0 01/21/2024 1534   PROT 6.3 08/02/2020 1233   ALBUMIN 3.9 01/21/2024 1534   ALBUMIN 4.1 08/02/2020 1233   AST 20 01/21/2024 1534   ALT 15 01/21/2024 1534   ALKPHOS 89 01/21/2024 1534   BILITOT 0.3 01/21/2024 1534   GFRNONAA 56 (L) 01/21/2024 1534   GFRAA 93 08/02/2020 1233   No results found for: "SPEP", "UPEP" Lab Results  Component Value Date   TSH 3.390 08/11/2023   T4TOTAL 8.0 08/11/2023     RADIOGRAPHIC STUDIES: I have personally reviewed the radiological images as listed and agreed with the findings in the report. EXAM: 01/15/2024 CT CHEST WITH CONTRAST IMPRESSION: 1. No metastatic disease identified within the chest. 2. There is new mild superior endplate compression deformity of T3 vertebral body. No significant retropulsion or spinal canal compromise. 3. Multiple other nonacute observations, as described above.      I,Jasmine M Lassiter,acting as a scribe for Nolia Baumgartner, MD.,have documented all relevant  documentation on the behalf of Nolia Baumgartner, MD,as directed by  Nolia Baumgartner, MD while in the presence of Nolia Baumgartner, MD.

## 2024-01-22 ENCOUNTER — Other Ambulatory Visit: Payer: Self-pay | Admitting: Pharmacist

## 2024-01-22 ENCOUNTER — Telehealth: Payer: Self-pay | Admitting: Oncology

## 2024-01-22 NOTE — Telephone Encounter (Signed)
 Patient has been scheduled for follow-up visit per 01/19/24 LOS.  Pt aware of scheduled appt details.

## 2024-01-23 ENCOUNTER — Inpatient Hospital Stay

## 2024-01-23 ENCOUNTER — Telehealth: Payer: Self-pay

## 2024-01-23 VITALS — BP 137/82 | HR 98 | Temp 97.9°F | Resp 18

## 2024-01-23 DIAGNOSIS — M810 Age-related osteoporosis without current pathological fracture: Secondary | ICD-10-CM

## 2024-01-23 DIAGNOSIS — C3412 Malignant neoplasm of upper lobe, left bronchus or lung: Secondary | ICD-10-CM | POA: Diagnosis not present

## 2024-01-23 LAB — URINE CULTURE: Culture: >=100000 — AB

## 2024-01-23 MED ORDER — DENOSUMAB 60 MG/ML ~~LOC~~ SOSY
60.0000 mg | PREFILLED_SYRINGE | Freq: Once | SUBCUTANEOUS | Status: AC
  Administered 2024-01-23: 60 mg via SUBCUTANEOUS
  Filled 2024-01-23: qty 1

## 2024-01-23 NOTE — Patient Instructions (Signed)
 Denosumab Injection (Osteoporosis) What is this medication? DENOSUMAB (den oh SUE mab) prevents and treats osteoporosis. It works by Interior and spatial designer stronger and less likely to break (fracture). It is a monoclonal antibody. This medicine may be used for other purposes; ask your health care provider or pharmacist if you have questions. COMMON BRAND NAME(S): Prolia What should I tell my care team before I take this medication? They need to know if you have any of these conditions: Dental or gum disease Had thyroid or parathyroid (glands located in neck) surgery Having dental surgery or a tooth pulled Kidney disease Low levels of calcium in the blood On dialysis Poor nutrition Thyroid disease Trouble absorbing nutrients from your food An unusual or allergic reaction to denosumab, other medications, foods, dyes, or preservatives Pregnant or trying to get pregnant Breastfeeding How should I use this medication? This medication is injected under the skin. It is given by your care team in a hospital or clinic setting. A special MedGuide will be given to you before each treatment. Be sure to read this information carefully each time. Talk to your care team about the use of this medication in children. Special care may be needed. Overdosage: If you think you have taken too much of this medicine contact a poison control center or emergency room at once. NOTE: This medicine is only for you. Do not share this medicine with others. What if I miss a dose? Keep appointments for follow-up doses. It is important not to miss your dose. Call your care team if you are unable to keep an appointment. What may interact with this medication? Do not take this medication with any of the following: Other medications that contain denosumab This medication may also interact with the following: Medications that lower your chance of fighting infection Steroid medications, such as prednisone or cortisone This  list may not describe all possible interactions. Give your health care provider a list of all the medicines, herbs, non-prescription drugs, or dietary supplements you use. Also tell them if you smoke, drink alcohol, or use illegal drugs. Some items may interact with your medicine. What should I watch for while using this medication? Your condition will be monitored carefully while you are receiving this medication. You may need blood work done while taking this medication. This medication may increase your risk of getting an infection. Call your care team for advice if you get a fever, chills, sore throat, or other symptoms of a cold or flu. Do not treat yourself. Try to avoid being around people who are sick. Tell your dentist and dental surgeon that you are taking this medication. You should not have major dental surgery while on this medication. See your dentist to have a dental exam and fix any dental problems before starting this medication. Take good care of your teeth while on this medication. Make sure you see your dentist for regular follow-up appointments. This medication may cause low levels of calcium in your body. The risk of severe side effects is increased in people with kidney disease. Your care team may prescribe calcium and vitamin D to help prevent low calcium levels while you take this medication. It is important to take calcium and vitamin D as directed by your care team. Talk to your care team if you may be pregnant. Serious birth defects may occur if you take this medication during pregnancy and for 5 months after the last dose. You will need a negative pregnancy test before starting this medication. Contraception  is recommended while taking this medication and for 5 months after the last dose. Your care team can help you find the option that works for you. Talk to your care team before breastfeeding. Changes to your treatment plan may be needed. What side effects may I notice from  receiving this medication? Side effects that you should report to your care team as soon as possible: Allergic reactions--skin rash, itching, hives, swelling of the face, lips, tongue, or throat Infection--fever, chills, cough, sore throat, wounds that don't heal, pain or trouble when passing urine, general feeling of discomfort or being unwell Low calcium level--muscle pain or cramps, confusion, tingling, or numbness in the hands or feet Osteonecrosis of the jaw--pain, swelling, or redness in the mouth, numbness of the jaw, poor healing after dental work, unusual discharge from the mouth, visible bones in the mouth Severe bone, joint, or muscle pain Skin infection--skin redness, swelling, warmth, or pain Side effects that usually do not require medical attention (report these to your care team if they continue or are bothersome): Back pain Headache Joint pain Muscle pain Pain in the hands, arms, legs, or feet Runny or stuffy nose Sore throat This list may not describe all possible side effects. Call your doctor for medical advice about side effects. You may report side effects to FDA at 1-800-FDA-1088. Where should I keep my medication? This medication is given in a hospital or clinic. It will not be stored at home. NOTE: This sheet is a summary. It may not cover all possible information. If you have questions about this medicine, talk to your doctor, pharmacist, or health care provider.  2024 Elsevier/Gold Standard (2022-10-01 00:00:00)

## 2024-01-23 NOTE — Telephone Encounter (Signed)
-----   Message from Nolia Baumgartner sent at 01/23/2024  4:27 PM EDT ----- Regarding: call Tell her culture does show UTI and it is sensitive to the Cipro so take full course

## 2024-01-28 ENCOUNTER — Encounter: Payer: Self-pay | Admitting: Oncology

## 2024-02-13 DIAGNOSIS — Z85118 Personal history of other malignant neoplasm of bronchus and lung: Secondary | ICD-10-CM | POA: Diagnosis not present

## 2024-02-13 DIAGNOSIS — K529 Noninfective gastroenteritis and colitis, unspecified: Secondary | ICD-10-CM | POA: Diagnosis not present

## 2024-02-13 DIAGNOSIS — Z6822 Body mass index (BMI) 22.0-22.9, adult: Secondary | ICD-10-CM | POA: Diagnosis not present

## 2024-02-13 DIAGNOSIS — I10 Essential (primary) hypertension: Secondary | ICD-10-CM | POA: Diagnosis not present

## 2024-02-19 NOTE — Addendum Note (Signed)
 Addended by: Lott Rouleau A on: 02/19/2024 03:38 PM   Modules accepted: Orders

## 2024-02-19 NOTE — Progress Notes (Signed)
 Remote pacemaker transmission.

## 2024-03-22 DIAGNOSIS — R5381 Other malaise: Secondary | ICD-10-CM | POA: Diagnosis not present

## 2024-03-22 DIAGNOSIS — N390 Urinary tract infection, site not specified: Secondary | ICD-10-CM | POA: Diagnosis not present

## 2024-03-22 DIAGNOSIS — Z6822 Body mass index (BMI) 22.0-22.9, adult: Secondary | ICD-10-CM | POA: Diagnosis not present

## 2024-03-22 DIAGNOSIS — A499 Bacterial infection, unspecified: Secondary | ICD-10-CM | POA: Diagnosis not present

## 2024-03-22 DIAGNOSIS — J9611 Chronic respiratory failure with hypoxia: Secondary | ICD-10-CM | POA: Diagnosis not present

## 2024-03-22 DIAGNOSIS — D539 Nutritional anemia, unspecified: Secondary | ICD-10-CM | POA: Diagnosis not present

## 2024-03-22 DIAGNOSIS — F331 Major depressive disorder, recurrent, moderate: Secondary | ICD-10-CM | POA: Diagnosis not present

## 2024-03-22 DIAGNOSIS — E538 Deficiency of other specified B group vitamins: Secondary | ICD-10-CM | POA: Diagnosis not present

## 2024-03-22 DIAGNOSIS — R5383 Other fatigue: Secondary | ICD-10-CM | POA: Diagnosis not present

## 2024-03-22 DIAGNOSIS — E785 Hyperlipidemia, unspecified: Secondary | ICD-10-CM | POA: Diagnosis not present

## 2024-03-22 DIAGNOSIS — E1165 Type 2 diabetes mellitus with hyperglycemia: Secondary | ICD-10-CM | POA: Diagnosis not present

## 2024-03-29 DIAGNOSIS — Z79899 Other long term (current) drug therapy: Secondary | ICD-10-CM | POA: Diagnosis not present

## 2024-03-29 DIAGNOSIS — N390 Urinary tract infection, site not specified: Secondary | ICD-10-CM | POA: Diagnosis not present

## 2024-04-07 ENCOUNTER — Encounter

## 2024-05-04 DIAGNOSIS — Z6823 Body mass index (BMI) 23.0-23.9, adult: Secondary | ICD-10-CM | POA: Diagnosis not present

## 2024-05-04 DIAGNOSIS — A499 Bacterial infection, unspecified: Secondary | ICD-10-CM | POA: Diagnosis not present

## 2024-05-04 DIAGNOSIS — N390 Urinary tract infection, site not specified: Secondary | ICD-10-CM | POA: Diagnosis not present

## 2024-05-04 DIAGNOSIS — E538 Deficiency of other specified B group vitamins: Secondary | ICD-10-CM | POA: Diagnosis not present

## 2024-05-04 DIAGNOSIS — Z950 Presence of cardiac pacemaker: Secondary | ICD-10-CM | POA: Diagnosis not present

## 2024-05-04 DIAGNOSIS — K529 Noninfective gastroenteritis and colitis, unspecified: Secondary | ICD-10-CM | POA: Diagnosis not present

## 2024-05-13 DIAGNOSIS — N390 Urinary tract infection, site not specified: Secondary | ICD-10-CM | POA: Diagnosis not present

## 2024-05-13 DIAGNOSIS — Z79899 Other long term (current) drug therapy: Secondary | ICD-10-CM | POA: Diagnosis not present

## 2024-05-14 DIAGNOSIS — Z79899 Other long term (current) drug therapy: Secondary | ICD-10-CM | POA: Diagnosis not present

## 2024-05-14 DIAGNOSIS — N390 Urinary tract infection, site not specified: Secondary | ICD-10-CM | POA: Diagnosis not present

## 2024-05-15 DIAGNOSIS — Z79899 Other long term (current) drug therapy: Secondary | ICD-10-CM | POA: Diagnosis not present

## 2024-05-15 DIAGNOSIS — N390 Urinary tract infection, site not specified: Secondary | ICD-10-CM | POA: Diagnosis not present

## 2024-05-16 DIAGNOSIS — N390 Urinary tract infection, site not specified: Secondary | ICD-10-CM | POA: Diagnosis not present

## 2024-05-16 DIAGNOSIS — Z79899 Other long term (current) drug therapy: Secondary | ICD-10-CM | POA: Diagnosis not present

## 2024-05-17 DIAGNOSIS — R9431 Abnormal electrocardiogram [ECG] [EKG]: Secondary | ICD-10-CM | POA: Diagnosis not present

## 2024-05-17 DIAGNOSIS — M549 Dorsalgia, unspecified: Secondary | ICD-10-CM | POA: Diagnosis not present

## 2024-05-17 DIAGNOSIS — M545 Low back pain, unspecified: Secondary | ICD-10-CM | POA: Diagnosis not present

## 2024-05-17 DIAGNOSIS — R112 Nausea with vomiting, unspecified: Secondary | ICD-10-CM | POA: Diagnosis not present

## 2024-05-17 DIAGNOSIS — W19XXXA Unspecified fall, initial encounter: Secondary | ICD-10-CM | POA: Diagnosis not present

## 2024-05-17 DIAGNOSIS — I10 Essential (primary) hypertension: Secondary | ICD-10-CM | POA: Diagnosis not present

## 2024-05-17 DIAGNOSIS — A419 Sepsis, unspecified organism: Secondary | ICD-10-CM | POA: Diagnosis not present

## 2024-05-17 DIAGNOSIS — R531 Weakness: Secondary | ICD-10-CM | POA: Diagnosis not present

## 2024-05-18 DIAGNOSIS — N390 Urinary tract infection, site not specified: Secondary | ICD-10-CM | POA: Diagnosis not present

## 2024-05-18 DIAGNOSIS — Z79899 Other long term (current) drug therapy: Secondary | ICD-10-CM | POA: Diagnosis not present

## 2024-05-19 DIAGNOSIS — Z79899 Other long term (current) drug therapy: Secondary | ICD-10-CM | POA: Diagnosis not present

## 2024-05-19 DIAGNOSIS — N390 Urinary tract infection, site not specified: Secondary | ICD-10-CM | POA: Diagnosis not present

## 2024-05-21 DIAGNOSIS — Z79899 Other long term (current) drug therapy: Secondary | ICD-10-CM | POA: Diagnosis not present

## 2024-05-21 DIAGNOSIS — N390 Urinary tract infection, site not specified: Secondary | ICD-10-CM | POA: Diagnosis not present

## 2024-05-22 DIAGNOSIS — N390 Urinary tract infection, site not specified: Secondary | ICD-10-CM | POA: Diagnosis not present

## 2024-05-22 DIAGNOSIS — Z79899 Other long term (current) drug therapy: Secondary | ICD-10-CM | POA: Diagnosis not present

## 2024-06-07 ENCOUNTER — Encounter: Payer: Self-pay | Admitting: Oncology

## 2024-06-14 DIAGNOSIS — I10 Essential (primary) hypertension: Secondary | ICD-10-CM | POA: Diagnosis not present

## 2024-06-14 DIAGNOSIS — J4550 Severe persistent asthma, uncomplicated: Secondary | ICD-10-CM | POA: Diagnosis not present

## 2024-06-14 DIAGNOSIS — R5381 Other malaise: Secondary | ICD-10-CM | POA: Diagnosis not present

## 2024-06-14 DIAGNOSIS — R918 Other nonspecific abnormal finding of lung field: Secondary | ICD-10-CM | POA: Diagnosis not present

## 2024-06-14 DIAGNOSIS — E538 Deficiency of other specified B group vitamins: Secondary | ICD-10-CM | POA: Diagnosis not present

## 2024-06-14 DIAGNOSIS — R110 Nausea: Secondary | ICD-10-CM | POA: Diagnosis not present

## 2024-06-14 DIAGNOSIS — J4541 Moderate persistent asthma with (acute) exacerbation: Secondary | ICD-10-CM | POA: Diagnosis not present

## 2024-06-14 DIAGNOSIS — Z6822 Body mass index (BMI) 22.0-22.9, adult: Secondary | ICD-10-CM | POA: Diagnosis not present

## 2024-06-14 DIAGNOSIS — J9611 Chronic respiratory failure with hypoxia: Secondary | ICD-10-CM | POA: Diagnosis not present

## 2024-06-22 DIAGNOSIS — R5383 Other fatigue: Secondary | ICD-10-CM | POA: Diagnosis not present

## 2024-06-22 DIAGNOSIS — R918 Other nonspecific abnormal finding of lung field: Secondary | ICD-10-CM | POA: Diagnosis not present

## 2024-06-22 DIAGNOSIS — J4550 Severe persistent asthma, uncomplicated: Secondary | ICD-10-CM | POA: Diagnosis not present

## 2024-07-07 ENCOUNTER — Ambulatory Visit (INDEPENDENT_AMBULATORY_CARE_PROVIDER_SITE_OTHER)

## 2024-07-07 DIAGNOSIS — I495 Sick sinus syndrome: Secondary | ICD-10-CM

## 2024-07-12 ENCOUNTER — Telehealth: Payer: Self-pay | Admitting: Oncology

## 2024-07-12 NOTE — Telephone Encounter (Signed)
 Contacted pt as we received notification from [facility] that they have been unable to reach her to schedule DEXA Exam.  I spoke with  pt and she stated she did not want to have that exam done and does not want her shot for bones either (Prolia ). SABRA

## 2024-07-16 LAB — CUP PACEART REMOTE DEVICE CHECK
Battery Remaining Longevity: 150 mo
Battery Voltage: 3.05 V
Brady Statistic AP VP Percent: 0.12 %
Brady Statistic AP VS Percent: 87.62 %
Brady Statistic AS VP Percent: 0.01 %
Brady Statistic AS VS Percent: 12.25 %
Brady Statistic RA Percent Paced: 87.76 %
Brady Statistic RV Percent Paced: 0.13 %
Date Time Interrogation Session: 20251030032908
Implantable Lead Connection Status: 753985
Implantable Lead Connection Status: 753985
Implantable Lead Implant Date: 20250121
Implantable Lead Implant Date: 20250121
Implantable Lead Location: 753859
Implantable Lead Location: 753860
Implantable Lead Model: 5076
Implantable Lead Model: 5076
Implantable Pulse Generator Implant Date: 20250121
Lead Channel Impedance Value: 418 Ohm
Lead Channel Impedance Value: 513 Ohm
Lead Channel Impedance Value: 646 Ohm
Lead Channel Impedance Value: 741 Ohm
Lead Channel Pacing Threshold Amplitude: 0.625 V
Lead Channel Pacing Threshold Amplitude: 0.75 V
Lead Channel Pacing Threshold Pulse Width: 0.4 ms
Lead Channel Pacing Threshold Pulse Width: 0.4 ms
Lead Channel Sensing Intrinsic Amplitude: 2.625 mV
Lead Channel Sensing Intrinsic Amplitude: 2.625 mV
Lead Channel Sensing Intrinsic Amplitude: 7.875 mV
Lead Channel Sensing Intrinsic Amplitude: 7.875 mV
Lead Channel Setting Pacing Amplitude: 1.5 V
Lead Channel Setting Pacing Amplitude: 2 V
Lead Channel Setting Pacing Pulse Width: 0.4 ms
Lead Channel Setting Sensing Sensitivity: 1.2 mV
Zone Setting Status: 755011

## 2024-07-17 ENCOUNTER — Other Ambulatory Visit: Payer: Self-pay | Admitting: Cardiology

## 2024-07-19 NOTE — Progress Notes (Signed)
 Remote PPM Transmission

## 2024-07-20 ENCOUNTER — Ambulatory Visit: Payer: Self-pay | Admitting: Cardiology

## 2024-07-20 ENCOUNTER — Encounter: Payer: Self-pay | Admitting: Oncology

## 2024-07-23 ENCOUNTER — Inpatient Hospital Stay: Attending: Oncology

## 2024-07-23 ENCOUNTER — Inpatient Hospital Stay: Admitting: Oncology

## 2024-07-23 ENCOUNTER — Inpatient Hospital Stay

## 2024-07-23 NOTE — Progress Notes (Incomplete)
 Patient Care Team: Erick Greig LABOR, NP as PCP - General (Internal Medicine) Monetta Redell PARAS, Angela Phillips as PCP - Cardiology (Cardiology) Inocencio Soyla Lunger, Angela Phillips as PCP - Electrophysiology (Cardiology) Kerrin Elspeth BROCKS, Angela Phillips as Consulting Physician (Cardiothoracic Surgery) Shannon Agent, Angela Phillips as Consulting Physician (Radiation Oncology) Chodri, Tanvir, Angela Phillips as Referring Physician (Pulmonary Disease) Cornelius Angela DEL, Angela Phillips as Consulting Physician (Oncology)  Clinic Day: 07/23/2024  Referring physician: Erick Greig LABOR, NP  ASSESSMENT & PLAN:  Assessment: Cancer of upper lobe of left lung [facility]) History of left upper lobe and right lower lobe nodules, 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 Bronchoscopy with biopsy at that time revealed atypical cells. She was deemed a poor surgical candidate due to multiple comorbidities including severe oxygen  dependent COPD. She completed stereotactic radiation therapy to both lesions in late February 2022. Follow up imaging and office visit in July of 2022 with Dr. Shannon revealed her to be stable with no suspicious areas and expected radiation changes. CT in January of 2023 revealed post treatment changes with no new or progressive findings. In August she had weight loss, fatigue and weakness, and CT 05/09/22 revealed continued evolution of post-treatment changes in the right lower lobe and left upper lobe. CT scan done on October 29, 2022 which reveals postradiation changes but no evidence of recurrent or metastatic disease.  She has a small pleural effusion.  She has a worsening compression of the T8 vertebral body. 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  She was started on Prolia  injections on August 06, 2022 and tolerated this without any difficulty.  She is on calcium  supplement 1200 mg daily and her calcium  level is normal.  However she stopped coming for these injections.  I will revisit this discussion after she has her bone density scan in 6 months  Cardiac arrhythmias She has coronary artery disease and multiple cardiac issues including atherosclerosis and arrhythmias.  She was recently hospitalized in the ICU at [facility] for 2 weeks.  Recurrent UTI's She has had recurrent UTIs and now complains of dysuria and malodorous cloudy urine so we will get a urinalysis and urine culture.  Due to her severe symptoms, I will go ahead and start her on Cipro  while awaiting the culture results.  Plan:   I will order a urine culture and urinalysis today for further evaluation of her recurrent UTI symptoms and will prescribe Cipro  500 mg BID for 10 days. She had a CT chest done on 01/15/2024 that revealed no metastatic disease identified within the chest, scarring in the left lung apex appears unchanged however in the right lung lower lobe appears slightly decreased in volume, an new mild superior endplate compression deformity of T3 vertebral body, no significant retropulsion or spinal canal compromise, and multiple other nonacute observations. Patient informed me that she had a pacemaker put in, in February she broke her left shoulder and had fractured ribs in December. She also had hip surgery in the past. CBC, CMP, and CEA today are  pending. She is due for her Prolia  injection and we will repeat that in 6 months. She will also be due for her next bone density scan in 6 months, prior to her next appointment. I will see her back in 6 months with  CBC, CMP, and CEA.   She understands and agrees with this plan of care and knows to call if she has further concerns regarding her lung cancers.  I provided *** minutes of time during this this encounter and > 50% was spent counseling as documented under my assessment and plan.   Angela VEAR Cornish, Angela Phillips  [facility] OF [facility] [address] [place] [address] Dept: [phone] Dept Fax: [phone]   No orders of the defined types were placed in this encounter.   CHIEF COMPLAINT:  CC: Bilateral non-small cell lung cancers  Current Treatment:  Surveillance  INTERVAL HISTORY:  Angela Phillips is here today for repeat clinical assessment for her bilateral non-small cell lung cancers. These were treated with stereotactic radiation successfully. Patient states that she feels *** and ***.        She has a WBC of ***, hemoglobin of ***, and platelet count of ***. Her CMP ***. Her CEA is pending.    I will see her back in *** with ***.  She denies fever, chills, night sweats, or other signs of infection. She denies cardiorespiratory and gastrointestinal issues. She  denies pain. Her appetite is *** and Her weight {Weight change:10426}.  Wt Readings from Last 3 Encounters:  01/21/24 117 lb 6.4 oz (53.3 kg)  01/12/24 118 lb 12.8 oz (53.9 kg)  09/30/23 114 lb (51.7 kg)   Patient states that she feels well but complains of occasional soreness of the left elbow and UTI symptoms. She informed me that she was recently on a course of Cefdinir in April for UTI and this had helped her symptoms temporarily. Since finishing this she states that her UTI symptoms are reoccurring. I will order a urine culture and urinalysis today for further evaluation and will prescribe Cipro  500 mg BID for 10 days. She had a CT chest done on 01/15/2024 that revealed no metastatic disease identified within the chest, scarring in the left lung apex  appears unchanged however in the right lung lower lobe appears slightly decreased in volume, an new mild superior endplate compression deformity of T3 vertebral body, no significant retropulsion or spinal canal compromise, and multiple other nonacute observations, as described above. Patient informed me that she had a pacemaker put in, in February she broke her left shoulder and had fractured ribs in December. She also had hip surgery in the past. CBC, CMP, and CEA today are pending.  However she stopped coming for these injections.  I will revisit this discussion after she has her bone density scan in 6 months  I will see her back in 6 months with CBC, CMP, CEA and bone density scan. She denies fever, chills, night sweats, or other signs of infection. She denies cardiorespiratory and gastrointestinal issues. She  denies pain. Her appetite is ok and her weight has increased 6 pounds over last 6 months.     I have reviewed the past medical history, past surgical history, social history and family history with the patient and they are unchanged from previous note.  ALLERGIES:  is allergic to beta adrenergic blockers, aspirin , atorvastatin, buspirone, celecoxib, chlorpheniramine, citalopram, diltiazem, ezetimibe, ezetimibe-simvastatin , hydrocodone, hydrocodone-acetaminophen , other,  prochlorperazine, prochlorperazine edisylate, prochlorperazine maleate, rosuvastatin, statins, venlafaxine, and verapamil .  MEDICATIONS:  Current Outpatient Medications  Medication Sig Dispense Refill   furosemide  (LASIX ) 20 MG tablet TAKE 1 TABLET BY MOUTH EVERY DAY 90 tablet 0   arformoterol  (BROVANA ) 15 MCG/2ML NEBU Take 15 mcg by nebulization every 12 (twelve) hours.     celecoxib (CELEBREX) 200 MG capsule Take 200 mg by mouth 2 (two) times daily.     clonazePAM  (KLONOPIN ) 1 MG tablet Take 0.5 tablets (0.5 mg total) by mouth every 6 (six) hours as needed for anxiety. (Patient taking differently: Take 1 mg by mouth at  bedtime. Additional 1 mg during the day if needed for sleep or anxiety)     FLUoxetine  (PROZAC ) 20 MG capsule Take 20 mg by mouth 2 (two) times daily.     fluticasone  (FLONASE ) 50 MCG/ACT nasal spray Place 1 spray into both nostrils 2 (two) times daily as needed for allergies or rhinitis. 16 g 0   gabapentin  (NEURONTIN ) 300 MG capsule Take 300 mg by mouth at bedtime.     ibuprofen (ADVIL) 800 MG tablet Take 800 mg by mouth 3 (three) times daily.     losartan -hydrochlorothiazide  (HYZAAR) 50-12.5 MG tablet Take 1 tablet by mouth daily.     metoprolol  succinate (TOPROL  XL) 25 MG 24 hr tablet Take 1 tablet (25 mg total) by mouth daily. 90 tablet 3   Multiple Vitamin (MULTI-VITAMINS) TABS Take 1 tablet by mouth daily. Centrum silver     omeprazole (PRILOSEC) 40 MG capsule Take 40 mg by mouth daily.     pravastatin  (PRAVACHOL ) 80 MG tablet Take 1 tablet (80 mg total) by mouth daily at 6 PM. 30 tablet 0   Tiotropium Bromide Monohydrate  (SPIRIVA  RESPIMAT) 1.25 MCG/ACT AERS Inhale 2 Inhalations into the lungs daily. 4 g 0   No current facility-administered medications for this visit.    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)   10/11/2020 Cancer Staging   Staging form: Lung, AJCC 8th Edition - Clinical stage from 10/11/2020: Stage IA2 (cT1b, cN0, cM0) - Signed by Cornelius Angela DEL, Angela Phillips on 06/10/2021 Histopathologic type: Adenocarcinoma, NOS Stage prefix: Initial diagnosis Histologic grade (G): GX Histologic grading system: 4 grade system Laterality: Left Tumor size (mm): 16 Lymph-vascular invasion (LVI): Presence of LVI unknown/indeterminate Diagnostic confirmation: Radiography and other imaging techniques without microscopic confirmation Staged by: Managing physician Stage used in treatment planning: Yes National guidelines used in treatment planning: Yes Type of national  guideline used in treatment planning: NCCN Staging comments: SBRT   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)  10/11/2020 Initial Diagnosis   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)   10/11/2020 Cancer Staging   Staging form: Lung, AJCC 8th Edition - Clinical stage from 10/11/2020: Stage IA3 (cT1c, cN0, cM0) - Signed by Cornelius Angela DEL, Angela Phillips on 06/10/2021 Stage prefix: Initial diagnosis Histologic grade (G): GX Histologic grading system: 4 grade system Laterality: Right Tumor size (mm): 22 Lymph-vascular invasion (LVI): Presence of LVI unknown/indeterminate Diagnostic confirmation: Radiography and other imaging techniques without microscopic confirmation Staged by: Managing physician Stage used in treatment planning: Yes National guidelines used in treatment planning: Yes Type of national guideline used in treatment planning: NCCN       REVIEW OF SYSTEMS:  Review of Systems  Constitutional: Negative.  Negative for appetite change, chills, diaphoresis,  fatigue, fever and unexpected weight change.  HENT:  Negative.  Negative for hearing loss, lump/mass, mouth sores, nosebleeds, sore throat, tinnitus, trouble swallowing and voice change.   Eyes: Negative.  Negative for eye problems and icterus.  Respiratory:  Positive for cough (with clear/white phlegm). Negative for chest tightness, hemoptysis, shortness of breath and wheezing.   Cardiovascular: Negative.  Negative for chest pain, leg swelling and palpitations.  Gastrointestinal: Negative.  Negative for abdominal distention, abdominal pain, blood in stool, constipation, diarrhea, nausea, rectal pain and vomiting.  Endocrine: Negative.   Genitourinary:  Positive for dysuria. Negative for bladder incontinence, difficulty urinating, dyspareunia, frequency, hematuria, menstrual problem, nocturia, pelvic pain, vaginal bleeding and vaginal discharge.        Cloudy and foul smelling urine  Musculoskeletal: Negative.  Negative for arthralgias,  back pain, flank pain, gait problem, myalgias, neck pain and neck stiffness.       Soreness of the left elbow  Skin: Negative.  Negative for itching, rash and wound.  Neurological: Negative.  Negative for extremity weakness, gait problem, numbness, seizures and speech difficulty.  Hematological: Negative.  Negative for adenopathy. Does not bruise/bleed easily.  Psychiatric/Behavioral: Negative.  Negative for confusion, decreased concentration, depression, sleep disturbance and suicidal ideas. The patient is not nervous/anxious.    VITALS:  There were no vitals taken for this visit.  Wt Readings from Last 3 Encounters:  01/21/24 117 lb 6.4 oz (53.3 kg)  01/12/24 118 lb 12.8 oz (53.9 kg)  09/30/23 114 lb (51.7 kg)    There is no height or weight on file to calculate BMI.  Performance status (ECOG): 1 - Symptomatic but completely ambulatory  PHYSICAL EXAM:  Physical Exam Vitals and nursing note reviewed.  Constitutional:      General: She is not in acute distress.    Appearance: Normal appearance. She is normal weight. She is not ill-appearing, toxic-appearing or diaphoretic.  HENT:     Head: Normocephalic and atraumatic.     Right Ear: Tympanic membrane, ear canal and external ear normal. There is no impacted cerumen.     Left Ear: Tympanic membrane, ear canal and external ear normal. There is no impacted cerumen.     Nose: Nose normal. No congestion or rhinorrhea.     Mouth/Throat:     Mouth: Mucous membranes are moist.     Pharynx: Oropharynx is clear. No oropharyngeal exudate or posterior oropharyngeal erythema.  Eyes:     General: No scleral icterus.       Left eye: No discharge.     Extraocular Movements: Extraocular movements intact.     Conjunctiva/sclera: Conjunctivae normal.     Pupils: Pupils are equal, round, and reactive to light.  Neck:     Vascular: No carotid bruit.  Cardiovascular:     Rate and Rhythm: Normal rate and regular rhythm.     Pulses: Normal pulses.      Heart sounds: Normal heart sounds. No murmur heard.    No friction rub. No gallop.  Pulmonary:     Effort: Pulmonary effort is normal. No respiratory distress.     Breath sounds: No stridor. No wheezing, rhonchi or rales.  Chest:     Chest wall: No tenderness.  Abdominal:     General: Bowel sounds are normal. There is no distension.     Palpations: Abdomen is soft. There is no hepatomegaly, splenomegaly or mass.     Tenderness: There is no abdominal tenderness. There is no right CVA tenderness, left CVA  tenderness, guarding or rebound.     Hernia: No hernia is present.  Musculoskeletal:        General: No swelling, tenderness, deformity or signs of injury. Normal range of motion.     Cervical back: Normal range of motion and neck supple. No rigidity or tenderness.     Right lower leg: No edema.     Left lower leg: No edema.     Comments: Elevation of the left shoulder with some firmness in the supraclavicular fossa.   Lymphadenopathy:     Cervical: No cervical adenopathy.     Right cervical: No superficial, deep or posterior cervical adenopathy.    Left cervical: No superficial, deep or posterior cervical adenopathy.     Upper Body:     Right upper body: No supraclavicular, axillary or pectoral adenopathy.     Left upper body: No supraclavicular, axillary or pectoral adenopathy.  Skin:    General: Skin is warm and dry.     Coloration: Skin is not jaundiced or pale.     Findings: No bruising, erythema, lesion or rash.  Neurological:     General: No focal deficit present.     Mental Status: She is alert and oriented to person, place, and time. Mental status is at baseline.     Cranial Nerves: No cranial nerve deficit.     Sensory: No sensory deficit.     Motor: No weakness.     Coordination: Coordination normal.     Gait: Gait normal.     Deep Tendon Reflexes: Reflexes normal.  Psychiatric:        Mood and Affect: Mood normal.        Behavior: Behavior normal.         Thought Content: Thought content normal.        Judgment: Judgment normal.   LABORATORY DATA:  I have reviewed the data as listed    Latest Ref Rng & Units 01/21/2024    3:34 PM 09/26/2023   10:23 AM 05/14/2023    3:30 AM  CBC  WBC 4.0 - 10.5 K/uL 10.4  8.8  12.8   Hemoglobin 12.0 - 15.0 g/dL 85.6  87.6  88.2   Hematocrit 36.0 - 46.0 % 44.0  38.7  36.7   Platelets 150 - 400 K/uL 283  232  279       Component Value Date/Time   NA 139 01/21/2024 1534   NA 141 09/26/2023 1023   K 3.9 01/21/2024 1534   CL 97 (L) 01/21/2024 1534   CO2 30 01/21/2024 1534   GLUCOSE 84 01/21/2024 1534   BUN 20 01/21/2024 1534   BUN 24 09/26/2023 1023   CREATININE 1.03 (H) 01/21/2024 1534   CALCIUM  10.1 01/21/2024 1534   PROT 7.0 01/21/2024 1534   PROT 6.3 08/02/2020 1233   ALBUMIN 3.9 01/21/2024 1534   ALBUMIN 4.1 08/02/2020 1233   AST 20 01/21/2024 1534   ALT 15 01/21/2024 1534   ALKPHOS 89 01/21/2024 1534   BILITOT 0.3 01/21/2024 1534   GFRNONAA 56 (L) 01/21/2024 1534   GFRAA 93 08/02/2020 1233   No results found for: SPEP, UPEP Lab Results  Component Value Date   TSH 3.390 08/11/2023   T4TOTAL 8.0 08/11/2023     RADIOGRAPHIC STUDIES: I have personally reviewed the radiological images as listed and agreed with the findings in the report. EXAM: 01/15/2024 CT CHEST WITH CONTRAST IMPRESSION: 1. No metastatic disease identified within the chest. 2. There is new mild  superior endplate compression deformity of T3 vertebral body. No significant retropulsion or spinal canal compromise. 3. Multiple other nonacute observations, as described above.     I,Yazmyn M Lowe,acting as a neurosurgeon for Angela VEAR Cornish, Angela Phillips.,have documented all relevant documentation on the behalf of Angela VEAR Cornish, Angela Phillips,as directed by  Angela VEAR Cornish, Angela Phillips while in the presence of Angela VEAR Cornish, Angela Phillips.  I have reviewed this report as typed by the medical scribe, and it is complete and accurate.

## 2024-10-06 ENCOUNTER — Ambulatory Visit

## 2024-10-06 DIAGNOSIS — I495 Sick sinus syndrome: Secondary | ICD-10-CM

## 2024-10-06 LAB — CUP PACEART REMOTE DEVICE CHECK
Battery Remaining Longevity: 145 mo
Battery Voltage: 3.04 V
Brady Statistic AP VP Percent: 0.06 %
Brady Statistic AP VS Percent: 73.35 %
Brady Statistic AS VP Percent: 0.01 %
Brady Statistic AS VS Percent: 26.59 %
Brady Statistic RA Percent Paced: 73.45 %
Brady Statistic RV Percent Paced: 0.07 %
Date Time Interrogation Session: 20260128050401
Implantable Lead Connection Status: 753985
Implantable Lead Connection Status: 753985
Implantable Lead Implant Date: 20250121
Implantable Lead Implant Date: 20250121
Implantable Lead Location: 753859
Implantable Lead Location: 753860
Implantable Lead Model: 5076
Implantable Lead Model: 5076
Implantable Pulse Generator Implant Date: 20250121
Lead Channel Impedance Value: 342 Ohm
Lead Channel Impedance Value: 418 Ohm
Lead Channel Impedance Value: 494 Ohm
Lead Channel Impedance Value: 608 Ohm
Lead Channel Pacing Threshold Amplitude: 0.625 V
Lead Channel Pacing Threshold Amplitude: 0.875 V
Lead Channel Pacing Threshold Pulse Width: 0.4 ms
Lead Channel Pacing Threshold Pulse Width: 0.4 ms
Lead Channel Sensing Intrinsic Amplitude: 2.125 mV
Lead Channel Sensing Intrinsic Amplitude: 2.125 mV
Lead Channel Sensing Intrinsic Amplitude: 6.5 mV
Lead Channel Sensing Intrinsic Amplitude: 6.5 mV
Lead Channel Setting Pacing Amplitude: 1.5 V
Lead Channel Setting Pacing Amplitude: 2 V
Lead Channel Setting Pacing Pulse Width: 0.4 ms
Lead Channel Setting Sensing Sensitivity: 1.2 mV
Zone Setting Status: 755011

## 2024-10-07 ENCOUNTER — Ambulatory Visit: Payer: Self-pay | Admitting: Cardiology

## 2024-10-14 NOTE — Progress Notes (Signed)
 Remote PPM Transmission

## 2024-10-15 ENCOUNTER — Ambulatory Visit: Payer: HMO

## 2025-01-05 ENCOUNTER — Encounter

## 2025-04-06 ENCOUNTER — Encounter

## 2025-07-06 ENCOUNTER — Encounter

## 2025-10-05 ENCOUNTER — Encounter
